# Patient Record
Sex: Female | Born: 1935 | Hispanic: Yes | Marital: Married | Smoking: Never smoker
Health system: Southern US, Community
[De-identification: ages and names within clinical notes are randomized; demographics above are authoritative.]

## PROBLEM LIST (undated history)

## (undated) DIAGNOSIS — M8580 Other specified disorders of bone density and structure, unspecified site: Secondary | ICD-10-CM

## (undated) DIAGNOSIS — E079 Disorder of thyroid, unspecified: Secondary | ICD-10-CM

## (undated) DIAGNOSIS — M5414 Radiculopathy, thoracic region: Secondary | ICD-10-CM

## (undated) DIAGNOSIS — N390 Urinary tract infection, site not specified: Secondary | ICD-10-CM

## (undated) DIAGNOSIS — I8311 Varicose veins of right lower extremity with inflammation: Secondary | ICD-10-CM

## (undated) DIAGNOSIS — T7840XA Allergy, unspecified, initial encounter: Secondary | ICD-10-CM

## (undated) DIAGNOSIS — R519 Headache, unspecified: Secondary | ICD-10-CM

## (undated) DIAGNOSIS — R0600 Dyspnea, unspecified: Secondary | ICD-10-CM

## (undated) DIAGNOSIS — M1990 Unspecified osteoarthritis, unspecified site: Secondary | ICD-10-CM

## (undated) DIAGNOSIS — K219 Gastro-esophageal reflux disease without esophagitis: Secondary | ICD-10-CM

## (undated) DIAGNOSIS — I10 Essential (primary) hypertension: Secondary | ICD-10-CM

## (undated) DIAGNOSIS — R51 Headache: Secondary | ICD-10-CM

## (undated) DIAGNOSIS — E039 Hypothyroidism, unspecified: Secondary | ICD-10-CM

## (undated) DIAGNOSIS — B019 Varicella without complication: Secondary | ICD-10-CM

## (undated) DIAGNOSIS — H269 Unspecified cataract: Secondary | ICD-10-CM

## (undated) DIAGNOSIS — H401133 Primary open-angle glaucoma, bilateral, severe stage: Secondary | ICD-10-CM

## (undated) DIAGNOSIS — M5431 Sciatica, right side: Secondary | ICD-10-CM

## (undated) DIAGNOSIS — R32 Unspecified urinary incontinence: Secondary | ICD-10-CM

## (undated) DIAGNOSIS — K635 Polyp of colon: Secondary | ICD-10-CM

## (undated) HISTORY — DX: Headache, unspecified: R51.9

## (undated) HISTORY — DX: Disorder of thyroid, unspecified: E07.9

## (undated) HISTORY — DX: Varicella without complication: B01.9

## (undated) HISTORY — DX: Unspecified urinary incontinence: R32

## (undated) HISTORY — DX: Allergy, unspecified, initial encounter: T78.40XA

## (undated) HISTORY — DX: Sciatica, right side: M54.31

## (undated) HISTORY — DX: Unspecified cataract: H26.9

## (undated) HISTORY — PX: CHOLECYSTECTOMY: SHX55

## (undated) HISTORY — DX: Essential (primary) hypertension: I10

## (undated) HISTORY — DX: Polyp of colon: K63.5

## (undated) HISTORY — PX: BUNIONECTOMY: SHX129

## (undated) HISTORY — DX: Urinary tract infection, site not specified: N39.0

## (undated) HISTORY — DX: Unspecified osteoarthritis, unspecified site: M19.90

## (undated) HISTORY — PX: TONSILLECTOMY: SUR1361

## (undated) HISTORY — PX: EYE SURGERY: SHX253

## (undated) HISTORY — DX: Headache: R51

---

## 1996-02-16 HISTORY — PX: APPENDECTOMY: SHX54

## 2002-02-15 HISTORY — PX: CATARACT EXTRACTION W/ INTRAOCULAR LENS  IMPLANT, BILATERAL: SHX1307

## 2005-02-15 HISTORY — PX: COLONOSCOPY: SHX174

## 2006-02-15 DIAGNOSIS — E1142 Type 2 diabetes mellitus with diabetic polyneuropathy: Secondary | ICD-10-CM

## 2006-02-15 HISTORY — DX: Type 2 diabetes mellitus with diabetic polyneuropathy: E11.42

## 2006-07-22 LAB — HM COLONOSCOPY: HM Colonoscopy: NORMAL

## 2010-09-15 LAB — HM PAP SMEAR: HM Pap smear: NORMAL

## 2011-07-08 ENCOUNTER — Ambulatory Visit: Payer: Self-pay | Admitting: Internal Medicine

## 2011-07-22 ENCOUNTER — Ambulatory Visit (INDEPENDENT_AMBULATORY_CARE_PROVIDER_SITE_OTHER): Payer: Medicare Other | Admitting: Internal Medicine

## 2011-07-22 ENCOUNTER — Encounter: Payer: Self-pay | Admitting: Internal Medicine

## 2011-07-22 VITALS — BP 110/70 | HR 65 | Temp 98.3°F | Ht 62.75 in | Wt 172.5 lb

## 2011-07-22 DIAGNOSIS — N838 Other noninflammatory disorders of ovary, fallopian tube and broad ligament: Secondary | ICD-10-CM | POA: Insufficient documentation

## 2011-07-22 DIAGNOSIS — N83209 Unspecified ovarian cyst, unspecified side: Secondary | ICD-10-CM

## 2011-07-22 DIAGNOSIS — E785 Hyperlipidemia, unspecified: Secondary | ICD-10-CM

## 2011-07-22 DIAGNOSIS — E1142 Type 2 diabetes mellitus with diabetic polyneuropathy: Secondary | ICD-10-CM | POA: Insufficient documentation

## 2011-07-22 DIAGNOSIS — D649 Anemia, unspecified: Secondary | ICD-10-CM

## 2011-07-22 DIAGNOSIS — N83201 Unspecified ovarian cyst, right side: Secondary | ICD-10-CM | POA: Insufficient documentation

## 2011-07-22 DIAGNOSIS — E119 Type 2 diabetes mellitus without complications: Secondary | ICD-10-CM

## 2011-07-22 DIAGNOSIS — E039 Hypothyroidism, unspecified: Secondary | ICD-10-CM | POA: Insufficient documentation

## 2011-07-22 MED ORDER — METFORMIN HCL 500 MG PO TABS: 500.0000 mg | ORAL_TABLET | Freq: Two times a day (BID) | ORAL | Status: DC

## 2011-07-22 NOTE — Assessment & Plan Note (Signed)
Reviewed records from February 2012 ultrasound showing right-sided septated ovarian cyst. We'll plan to repeat pelvic ultrasound to ensure that this has resolved.

## 2011-07-22 NOTE — Progress Notes (Signed)
Subjective:    Patient ID: [name], female    DOB: 1934/09/07, 77 y.o.   MRN: [id number]  HPI 77 year old female with history of diabetes, hypothyroidism, arthritis presents to establish care. She recently moved to [place] from [place]. She reports that she is generally feeling well. In regards to her diabetes, she reports that she is currently using Onglyza. She reports that this medication is very expensive for her. In the past, her blood sugar was well controlled with metformin alone. She changed from metformin because the preparation she was using had an odd smell to it. She would like to try back on metformin.  She also notes that she has a history of a right-sided ovarian cyst. This cyst was noted to be septated and she was told that she would need to have a repeat pelvic ultrasound in 2012. However, she has not yet followed through with this. She denies any recent change in bowel habits, abdominal pain, or abdominal distention.  Outpatient Encounter Prescriptions as of 07/22/2011  Medication Sig Dispense Refill  . aspirin 81 MG tablet Take 81 mg by mouth daily.      Marland Kitchen atenolol (TENORMIN) 50 MG tablet Take 50 mg by mouth daily.      . bimatoprost (LUMIGAN) 0.01 % SOLN Place 1 drop into both eyes at bedtime.      Marland Kitchen BIOTIN 5000 PO Take 1 tablet by mouth daily.      Marland Kitchen levothyroxine (SYNTHROID, LEVOTHROID) 100 MCG tablet Take 100 mcg by mouth daily.      Marland Kitchen losartan-hydrochlorothiazide (HYZAAR) 50-12.5 MG per tablet Take 1 tablet by mouth daily.      . montelukast (SINGULAIR) 10 MG tablet Take 10 mg by mouth daily.      . Multiple Vitamins-Minerals (MULTIVITAMIN PO) Take 1 tablet by mouth daily.      . Timolol Maleate (ISTALOL) 0.5 % (DAILY) SOLN Apply 1 drop to eye daily.      Marland Kitchen DISCONTD: saxagliptin HCl (ONGLYZA) 5 MG TABS tablet Take 5 mg by mouth daily.      . metFORMIN (GLUCOPHAGE) 500 MG tablet Take 1 tablet (500 mg total) by mouth 2 (two) times daily with a meal.  180  tablet  3    Review of Systems  Constitutional: Negative for fever, chills, appetite change, fatigue and unexpected weight change.  HENT: Negative for ear pain, congestion, sore throat, trouble swallowing, neck pain, voice change and sinus pressure.   Eyes: Negative for visual disturbance.  Respiratory: Negative for cough, shortness of breath, wheezing and stridor.   Cardiovascular: Negative for chest pain, palpitations and leg swelling.  Gastrointestinal: Negative for nausea, vomiting, abdominal pain, diarrhea, constipation, blood in stool, abdominal distention and anal bleeding.  Genitourinary: Negative for dysuria and flank pain.  Musculoskeletal: Negative for myalgias, arthralgias and gait problem.  Skin: Negative for color change and rash.  Neurological: Negative for dizziness and headaches.  Hematological: Negative for adenopathy. Does not bruise/bleed easily.  Psychiatric/Behavioral: Negative for suicidal ideas, sleep disturbance and dysphoric mood. The patient is not nervous/anxious.    BP 110/70  Pulse 65  Temp(Src) 98.3 F (36.8 C) (Oral)  Ht 5' 2.75" (1.594 m)  Wt 172 lb 8 oz (78.245 kg)  BMI 30.80 kg/m2  SpO2 96%     Objective:   Physical Exam  Constitutional: She is oriented to person, place, and time. She appears well-developed and well-nourished. No distress.  HENT:  Head: Normocephalic and atraumatic.  Right Ear: External ear normal.  Left Ear: External ear normal.  Nose: Nose normal.  Mouth/Throat: Oropharynx is clear and moist. No oropharyngeal exudate.  Eyes: Conjunctivae are normal. Pupils are equal, round, and reactive to light. Right eye exhibits no discharge. Left eye exhibits no discharge. No scleral icterus.  Neck: Normal range of motion. Neck supple. No tracheal deviation present. No thyromegaly present.  Cardiovascular: Normal rate, regular rhythm, normal heart sounds and intact distal pulses.  Exam reveals no gallop and no friction rub.   No murmur  heard. Pulmonary/Chest: Effort normal and breath sounds normal. No respiratory distress. She has no wheezes. She has no rales. She exhibits no tenderness.  Abdominal: Soft. Bowel sounds are normal. She exhibits no distension and no mass. There is no tenderness. There is no guarding.  Musculoskeletal: Normal range of motion. She exhibits no edema and no tenderness.  Lymphadenopathy:    She has no cervical adenopathy.  Neurological: She is alert and oriented to person, place, and time. No cranial nerve deficit. She exhibits normal muscle tone. Coordination normal.  Skin: Skin is warm and dry. No rash noted. She is not diaphoretic. No erythema. No pallor.  Psychiatric: She has a normal mood and affect. Her behavior is normal. Judgment and thought content normal.          Assessment & Plan:

## 2011-07-22 NOTE — Assessment & Plan Note (Signed)
Will check TSH with labs today. We'll plan to continue Synthroid.

## 2011-07-22 NOTE — Assessment & Plan Note (Signed)
Will try changing back to metformin given cost of Onglyza. Will check A1c with labs today. Patient will call her blood sugars and bring record of blood sugars to next visit in one month. We'll also check lipids with labs today, LDL less than 70. Patient is on an ARB. Patient will bring records from her previous physician in regards to her last Pneumovax.

## 2011-07-23 LAB — COMPREHENSIVE METABOLIC PANEL
ALT: 45 U/L — ABNORMAL HIGH (ref 0–35)
BUN: 19 mg/dL (ref 6–23)
CO2: 31 mEq/L (ref 19–32)
Calcium: 9 mg/dL (ref 8.4–10.5)
Chloride: 102 mEq/L (ref 96–112)
Creatinine, Ser: 0.9 mg/dL (ref 0.4–1.2)
GFR: 62.55 mL/min (ref >60.00)
Glucose, Bld: 83 mg/dL (ref 70–99)
Total Bilirubin: 0.5 mg/dL (ref 0.3–1.2)

## 2011-07-23 LAB — LIPID PANEL
Cholesterol: 121 mg/dL (ref 0–200)
HDL: 43.9 mg/dL (ref >39.00)
LDL Cholesterol: 58 mg/dL (ref 0–99)
Total CHOL/HDL Ratio: 3
Triglycerides: 96 mg/dL (ref 0.0–149.0)

## 2011-07-23 LAB — CBC WITH DIFFERENTIAL/PLATELET
Basophils Relative: 0.4 % (ref 0.0–3.0)
Eosinophils Relative: 3.6 % (ref 0.0–5.0)
Lymphocytes Relative: 33.9 % (ref 12.0–46.0)
Monocytes Absolute: 0.4 10*3/uL (ref 0.1–1.0)
Monocytes Relative: 7 % (ref 3.0–12.0)
Neutrophils Relative %: 55.1 % (ref 43.0–77.0)
Platelets: 196 10*3/uL (ref 150.0–400.0)
RBC: 4.33 Mil/uL (ref 3.87–5.11)
WBC: 5.5 10*3/uL (ref 4.5–10.5)

## 2011-07-23 LAB — TSH: TSH: 0.86 u[IU]/mL (ref 0.35–5.50)

## 2011-07-23 LAB — HEMOGLOBIN A1C: Hgb A1c MFr Bld: 6.5 % (ref 4.6–6.5)

## 2011-08-03 ENCOUNTER — Ambulatory Visit: Payer: Self-pay | Admitting: Internal Medicine

## 2011-08-04 ENCOUNTER — Telehealth: Payer: Self-pay | Admitting: Internal Medicine

## 2011-08-04 NOTE — Telephone Encounter (Signed)
Ultrasound of the Pelvis was normal.

## 2011-08-09 ENCOUNTER — Encounter: Payer: Self-pay | Admitting: Internal Medicine

## 2011-08-27 ENCOUNTER — Ambulatory Visit (INDEPENDENT_AMBULATORY_CARE_PROVIDER_SITE_OTHER)
Admission: RE | Admit: 2011-08-27 | Discharge: 2011-08-27 | Disposition: A | Discharge status: Home / Self Care | Payer: Medicare Other | Source: Ambulatory Visit | Attending: Internal Medicine | Admitting: Internal Medicine

## 2011-08-27 ENCOUNTER — Ambulatory Visit (INDEPENDENT_AMBULATORY_CARE_PROVIDER_SITE_OTHER): Payer: Medicare Other | Admitting: Internal Medicine

## 2011-08-27 ENCOUNTER — Encounter: Payer: Self-pay | Admitting: Internal Medicine

## 2011-08-27 ENCOUNTER — Telehealth: Payer: Self-pay | Admitting: Internal Medicine

## 2011-08-27 VITALS — BP 130/80 | HR 61 | Temp 98.1°F | Ht 62.75 in | Wt 170.8 lb

## 2011-08-27 DIAGNOSIS — E039 Hypothyroidism, unspecified: Secondary | ICD-10-CM

## 2011-08-27 DIAGNOSIS — I10 Essential (primary) hypertension: Secondary | ICD-10-CM

## 2011-08-27 DIAGNOSIS — R0789 Other chest pain: Secondary | ICD-10-CM

## 2011-08-27 DIAGNOSIS — E119 Type 2 diabetes mellitus without complications: Secondary | ICD-10-CM

## 2011-08-27 DIAGNOSIS — J45909 Unspecified asthma, uncomplicated: Secondary | ICD-10-CM

## 2011-08-27 DIAGNOSIS — N83209 Unspecified ovarian cyst, unspecified side: Secondary | ICD-10-CM

## 2011-08-27 DIAGNOSIS — R071 Chest pain on breathing: Secondary | ICD-10-CM

## 2011-08-27 DIAGNOSIS — N83201 Unspecified ovarian cyst, right side: Secondary | ICD-10-CM

## 2011-08-27 MED ORDER — LOSARTAN POTASSIUM-HCTZ 50-12.5 MG PO TABS: 1.0000 | ORAL_TABLET | Freq: Every day | ORAL | Status: DC

## 2011-08-27 MED ORDER — MONTELUKAST SODIUM 10 MG PO TABS: 10.0000 mg | ORAL_TABLET | Freq: Every day | ORAL | Status: DC

## 2011-08-27 MED ORDER — LEVOTHYROXINE SODIUM 100 MCG PO TABS: 100.0000 ug | ORAL_TABLET | Freq: Every day | ORAL | Status: DC

## 2011-08-27 MED ORDER — METFORMIN HCL 500 MG PO TABS: 500.0000 mg | ORAL_TABLET | Freq: Two times a day (BID) | ORAL | Status: DC

## 2011-08-27 MED ORDER — ATENOLOL 50 MG PO TABS: 50.0000 mg | ORAL_TABLET | Freq: Every day | ORAL | Status: DC

## 2011-08-27 NOTE — Progress Notes (Signed)
Subjective:    Patient ID: [name], female    DOB: 03-21-1934, 77 y.o.   MRN: [id number]  HPI 77 year old female with history of diabetes, hypothyroidism, hypertension presents for followup. In regards to her diabetes, at her last visit we had discussed transitioning back from onglyza  2 metformin. However, when her pharmacy dispensed metformin to her this medication had a foul smell to it and she did not start it. She stayed on Onglyza. She reports good control of her blood sugars. Her last A1c was 6.5%.  In regards to hypothyroidism and hypertension, she reports full compliance with her medications. She denies any noted side effects from the medicines. She did not bring a record of her blood pressures today.  She continues to have right-sided posterior chest wall pain. This is been present for several years. It is described as superficial pain which is at times tingling or itching. It occasionally feels sharp like stabbing pain. She reports has been evaluated in the past but no one has determine etiology. She denies shortness of breath, diaphoresis. Recently, she has been applying her husband's topical hydrocortisone cream to the area with some improvement in her symptoms.  Outpatient Encounter Prescriptions as of 08/27/2011  Medication Sig Dispense Refill  . aspirin 81 MG tablet Take 81 mg by mouth daily.      Marland Kitchen atenolol (TENORMIN) 50 MG tablet Take 1 tablet (50 mg total) by mouth daily.  90 tablet  4  . bimatoprost (LUMIGAN) 0.01 % SOLN Place 1 drop into both eyes at bedtime.      Marland Kitchen BIOTIN 5000 PO Take 1 tablet by mouth daily.      Marland Kitchen levothyroxine (SYNTHROID, LEVOTHROID) 100 MCG tablet Take 1 tablet (100 mcg total) by mouth daily.  90 tablet  4  . losartan-hydrochlorothiazide (HYZAAR) 50-12.5 MG per tablet Take 1 tablet by mouth daily.  90 tablet  4  . montelukast (SINGULAIR) 10 MG tablet Take 1 tablet (10 mg total) by mouth daily.  90 tablet  4  . Multiple Vitamins-Minerals  (MULTIVITAMIN PO) Take 1 tablet by mouth daily.      . Timolol Maleate (ISTALOL) 0.5 % (DAILY) SOLN Apply 1 drop to eye daily.      . metFORMIN (GLUCOPHAGE) 500 MG tablet Take 1 tablet (500 mg total) by mouth 2 (two) times daily with a meal.  60 tablet  3   BP 130/80  Pulse 61  Temp 98.1 F (36.7 C) (Oral)  Ht 5' 2.75" (1.594 m)  Wt 170 lb 12 oz (77.452 kg)  BMI 30.49 kg/m2  SpO2 97%  Review of Systems  Constitutional: Negative for fever, chills, appetite change, fatigue and unexpected weight change.  HENT: Negative for ear pain, congestion, sore throat, trouble swallowing, neck pain, voice change and sinus pressure.   Eyes: Negative for visual disturbance.  Respiratory: Negative for cough, shortness of breath, wheezing and stridor.   Cardiovascular: Positive for chest pain. Negative for palpitations and leg swelling.  Gastrointestinal: Negative for nausea, vomiting, abdominal pain, diarrhea, constipation, blood in stool, abdominal distention and anal bleeding.  Genitourinary: Negative for dysuria and flank pain.  Musculoskeletal: Negative for myalgias, arthralgias and gait problem.  Skin: Negative for color change and rash.  Neurological: Negative for dizziness and headaches.  Hematological: Negative for adenopathy. Does not bruise/bleed easily.  Psychiatric/Behavioral: Negative for suicidal ideas, disturbed wake/sleep cycle and dysphoric mood. The patient is not nervous/anxious.        Objective:   Physical Exam  Constitutional:  She is oriented to person, place, and time. She appears well-developed and well-nourished. No distress.  HENT:  Head: Normocephalic and atraumatic.  Right Ear: External ear normal.  Left Ear: External ear normal.  Nose: Nose normal.  Mouth/Throat: Oropharynx is clear and moist. No oropharyngeal exudate.  Eyes: Conjunctivae are normal. Pupils are equal, round, and reactive to light. Right eye exhibits no discharge. Left eye exhibits no discharge. No  scleral icterus.  Neck: Normal range of motion. Neck supple. No tracheal deviation present. No thyromegaly present.  Cardiovascular: Normal rate, regular rhythm, normal heart sounds and intact distal pulses.  Exam reveals no gallop and no friction rub.   No murmur heard. Pulmonary/Chest: Effort normal and breath sounds normal. No accessory muscle usage. Not tachypneic. No respiratory distress. She has no decreased breath sounds. She has no wheezes. She has no rhonchi. She has no rales.   She exhibits no tenderness.  Musculoskeletal: Normal range of motion. She exhibits no edema and no tenderness.  Lymphadenopathy:    She has no cervical adenopathy.  Neurological: She is alert and oriented to person, place, and time. No cranial nerve deficit. She exhibits normal muscle tone. Coordination normal.  Skin: Skin is warm and dry. No rash noted. She is not diaphoretic. No erythema. No pallor.  Psychiatric: She has a normal mood and affect. Her behavior is normal. Judgment and thought content normal.          Assessment & Plan:

## 2011-08-27 NOTE — Assessment & Plan Note (Signed)
Recent pelvic ultrasound was normal and demonstrated resolution of this cyst.

## 2011-08-27 NOTE — Patient Instructions (Signed)
Postherpetic Neuralgia  Shingles is a painful disease. It is caused by the herpes zoster virus. This is the same virus which also causes chickenpox. It can affect the torso, limbs, or the face. For most people, shingles is a condition of rather sudden onset. Pain usually lasts about 1 month.  In older patients, or patients with poor immune systems, a painful, long-standing (chronic) condition called postherpetic neuralgia can develop. This condition rarely happens before age 50. But at least 50% of people over 50 become affected following an attack of shingles. There is a natural tendency for this condition to improve over time with no treatment. Less than 5% of patients have pain that lasts for more than 1 year.  DIAGNOSIS   Herpes is usually easily diagnosed on physical exam. Pain sometimes follows when the skin sores (lesions) have disappeared. It is called postherpetic neuralgia. That name simply means the pain that follows herpes.  TREATMENT    Treating this condition may be difficult. Usually one of the tricyclic antidepressants, often amitriptyline, is the first line of treatment. There is evidence that the sooner these medications are given, the more likely they are to reduce pain.   Conventional analgesics, regional nerve blocks, and anticonvulsants have little benefit in most cases when used alone. Other tricyclic anti-depressants are used as a second option if the first antidepressant is unsuccessful.   Anticonvulsants, including carbamazepine, have been found to provide some added benefit when used with a tricyclic anti-depressant. This is especially for the stabbing type of pain similar to that of trigeminal neuralgia.   Chronic opioid therapy. This is a strong narcotic pain medication. It is used to treat pain that is resistant to other measures. The issues of dependency and tolerance can be reduced with closely managed care.   Some cream treatments are applied locally to the affected area. They  can help when used with other treatments. Their use may be difficult in the case of postherpetic trigeminal neuralgia. This is involved with the face. So the substances can irritate the eye and the skin around the eye. Examples of creams used include Capsaicin and lidocaine creams.   For shingles, antiviral therapies along with analgesics are recommended. Studies of the effect of anti-viral agents such as acyclovir on shingles have been done. They show improved rates of healing and decreased severity of sudden (acute) pain. Some observations suggest that nerve blocks during shingles infection will:   Reduce pain.   Shorten the acute episode.   Prevent the emergence of postherpetic neuralgia.  Viral medications used include Acyclovir (Zovirax), Valacyclovir, Famciclovir and a lysine diet.  Document Released: 04/24/2002 Document Revised: 01/21/2011 Document Reviewed: 02/01/2005  [employer] Patient Information 2012 [employer].

## 2011-08-27 NOTE — Assessment & Plan Note (Signed)
Blood sugars have been very well-controlled on onglyza. Attempted to switch back to metformin, however metformin that patient was given by her pharmacy had extremely foul smell so she did not take this medication. Will request a different brand of metformin from her pharmacy. Will continue onglyza for now. Repeat A1c in 3 months.

## 2011-08-27 NOTE — Telephone Encounter (Signed)
Abnormal metformin

## 2011-08-27 NOTE — Assessment & Plan Note (Signed)
Patient has right-sided posterior chest wall pain. Her description of the pain seems most consistent with postherpetic neuralgia, however she does not recall having shingles at this site. Plain x-ray of the chest was normal today. Exam is normal. She reports improvement using topical hydrocortisone cream. We'll plan to continue that for now as needed. If symptoms worsen, we discussed trying Neurontin.

## 2011-11-25 ENCOUNTER — Other Ambulatory Visit: Payer: Self-pay

## 2011-11-25 DIAGNOSIS — E785 Hyperlipidemia, unspecified: Secondary | ICD-10-CM

## 2011-11-26 ENCOUNTER — Other Ambulatory Visit: Payer: Medicare Other

## 2011-11-26 ENCOUNTER — Other Ambulatory Visit (INDEPENDENT_AMBULATORY_CARE_PROVIDER_SITE_OTHER): Payer: Medicare Other

## 2011-11-26 DIAGNOSIS — E785 Hyperlipidemia, unspecified: Secondary | ICD-10-CM

## 2011-11-26 LAB — COMPREHENSIVE METABOLIC PANEL
ALT: 39 U/L — ABNORMAL HIGH (ref 0–35)
AST: 23 U/L (ref 0–37)
BUN: 19 mg/dL (ref 6–23)
Creatinine, Ser: 0.8 mg/dL (ref 0.4–1.2)
Total Bilirubin: 0.8 mg/dL (ref 0.3–1.2)

## 2011-11-26 LAB — CBC WITH DIFFERENTIAL/PLATELET
Basophils Absolute: 0 10*3/uL (ref 0.0–0.1)
Eosinophils Absolute: 0.2 10*3/uL (ref 0.0–0.7)
HCT: 40.6 % (ref 36.0–46.0)
Hemoglobin: 13.4 g/dL (ref 12.0–15.0)
Lymphs Abs: 2 10*3/uL (ref 0.7–4.0)
MCHC: 32.9 g/dL (ref 30.0–36.0)
MCV: 89.5 fl (ref 78.0–100.0)
Monocytes Absolute: 0.5 10*3/uL (ref 0.1–1.0)
Neutro Abs: 3 10*3/uL (ref 1.4–7.7)
RDW: 14.5 % (ref 11.5–14.6)

## 2011-11-26 LAB — HEPATIC FUNCTION PANEL
AST: 23 U/L (ref 0–37)
Albumin: 3.9 g/dL (ref 3.5–5.2)
Alkaline Phosphatase: 60 U/L (ref 39–117)
Bilirubin, Direct: 0.2 mg/dL (ref 0.0–0.3)
Total Protein: 7 g/dL (ref 6.0–8.3)

## 2011-11-26 LAB — LIPID PANEL
Total CHOL/HDL Ratio: 3
VLDL: 14.8 mg/dL (ref 0.0–40.0)

## 2011-11-26 LAB — TSH: TSH: 3.94 u[IU]/mL (ref 0.35–5.50)

## 2011-11-29 ENCOUNTER — Encounter: Payer: Self-pay | Admitting: Internal Medicine

## 2011-11-29 ENCOUNTER — Ambulatory Visit (INDEPENDENT_AMBULATORY_CARE_PROVIDER_SITE_OTHER): Payer: Medicare Other | Admitting: Internal Medicine

## 2011-11-29 VITALS — BP 120/72 | HR 59 | Temp 97.7°F | Ht 63.75 in | Wt 174.5 lb

## 2011-11-29 DIAGNOSIS — E119 Type 2 diabetes mellitus without complications: Secondary | ICD-10-CM

## 2011-11-29 DIAGNOSIS — R7989 Other specified abnormal findings of blood chemistry: Secondary | ICD-10-CM

## 2011-11-29 DIAGNOSIS — E039 Hypothyroidism, unspecified: Secondary | ICD-10-CM

## 2011-11-29 DIAGNOSIS — I10 Essential (primary) hypertension: Secondary | ICD-10-CM | POA: Insufficient documentation

## 2011-11-29 DIAGNOSIS — H401190 Primary open-angle glaucoma, unspecified eye, stage unspecified: Secondary | ICD-10-CM | POA: Insufficient documentation

## 2011-11-29 DIAGNOSIS — H409 Unspecified glaucoma: Secondary | ICD-10-CM

## 2011-11-29 DIAGNOSIS — Z23 Encounter for immunization: Secondary | ICD-10-CM

## 2011-11-29 LAB — HEMOGLOBIN A1C: Hgb A1c MFr Bld: 6.6 % — ABNORMAL HIGH (ref 4.6–6.5)

## 2011-11-29 MED ORDER — METFORMIN HCL 500 MG PO TABS: 500.0000 mg | ORAL_TABLET | Freq: Two times a day (BID) | ORAL | Status: DC

## 2011-11-29 MED ORDER — ATENOLOL 50 MG PO TABS: 50.0000 mg | ORAL_TABLET | Freq: Every day | ORAL | Status: DC

## 2011-11-29 NOTE — Assessment & Plan Note (Signed)
LFTs noted to be elevated on multiple occasions. Will send labs today for hepatitis C and hepatitis B. If normal, may consider ultrasound to look for steatohepatitis in the future.

## 2011-11-29 NOTE — Assessment & Plan Note (Signed)
Blood pressure well-controlled on current medications. Recent renal function was normal. Followup 3 months.

## 2011-11-29 NOTE — Assessment & Plan Note (Signed)
Recent TSH was normal. Continue Synthroid 100 mcg daily. Followup in 3 months.

## 2011-11-29 NOTE — Addendum Note (Signed)
Addended by: Gilmer Mor on: 11/29/2011 02:53 PM   Modules accepted: Orders

## 2011-11-29 NOTE — Assessment & Plan Note (Signed)
Pt reports good control of BG. Will check A1c with labs today. Continue metformin. Follow up 3 months and prn.

## 2011-11-29 NOTE — Progress Notes (Signed)
Subjective:    Patient ID: [name], female    DOB: 05-20-1934, 77 y.o.   MRN: [id number]  HPI 77 year old female with history of diabetes, hypertension, hypothyroidism, glaucoma presents for followup. She reports she is generally feeling well. In regards to her diabetes, she reports that blood sugars have been well-controlled on metformin. She reports full compliance with her medication. She notes that she has a history in the past of glaucoma and has not yet established care with a local ophthalmologist. She would like referral for this today.  Today we discussed history of elevated liver function tests. She reports that she has never been diagnosed with abnormal liver function in the past. She denies any known history of hepatitis B or hepatitis C. She has never had blood transfusion. She denies any history of jaundice in the past.  Outpatient Encounter Prescriptions as of 11/29/2011  Medication Sig Dispense Refill  . aspirin 81 MG tablet Take 81 mg by mouth daily.      Marland Kitchen atenolol (TENORMIN) 50 MG tablet Take 1 tablet (50 mg total) by mouth daily.  90 tablet  3  . bimatoprost (LUMIGAN) 0.01 % SOLN Place 1 drop into both eyes at bedtime.      Marland Kitchen BIOTIN 5000 PO Take 1 tablet by mouth daily.      Marland Kitchen levothyroxine (SYNTHROID, LEVOTHROID) 100 MCG tablet Take 1 tablet (100 mcg total) by mouth daily.  90 tablet  4  . losartan-hydrochlorothiazide (HYZAAR) 50-12.5 MG per tablet Take 1 tablet by mouth daily.  90 tablet  4  . metFORMIN (GLUCOPHAGE) 500 MG tablet Take 1 tablet (500 mg total) by mouth 2 (two) times daily with a meal.  180 tablet  3  . montelukast (SINGULAIR) 10 MG tablet Take 1 tablet (10 mg total) by mouth daily.  90 tablet  4  . Multiple Vitamins-Minerals (MULTIVITAMIN PO) Take 1 tablet by mouth daily.      . Timolol Maleate (ISTALOL) 0.5 % (DAILY) SOLN Apply 1 drop to eye daily.      Marland Kitchen DISCONTD: atenolol (TENORMIN) 50 MG tablet Take 1 tablet (50 mg total) by mouth daily.  90 tablet   4  . DISCONTD: metFORMIN (GLUCOPHAGE) 500 MG tablet Take 1 tablet (500 mg total) by mouth 2 (two) times daily with a meal.  60 tablet  3    BP 120/72  Pulse 59  Temp 97.7 F (36.5 C) (Oral)  Ht 5' 3.75" (1.619 m)  Wt 174 lb 8 oz (79.153 kg)  BMI 30.19 kg/m2  SpO2 93%  Review of Systems  Constitutional: Negative for fever, chills, appetite change, fatigue and unexpected weight change.  HENT: Negative for ear pain, congestion, sore throat, trouble swallowing, neck pain, voice change and sinus pressure.   Eyes: Negative for visual disturbance.  Respiratory: Negative for cough, shortness of breath, wheezing and stridor.   Cardiovascular: Negative for chest pain, palpitations and leg swelling.  Gastrointestinal: Negative for nausea, vomiting, abdominal pain, diarrhea, constipation, blood in stool, abdominal distention and anal bleeding.  Genitourinary: Negative for dysuria and flank pain.  Musculoskeletal: Negative for myalgias, arthralgias and gait problem.  Skin: Negative for color change and rash.  Neurological: Negative for dizziness and headaches.  Hematological: Negative for adenopathy. Does not bruise/bleed easily.  Psychiatric/Behavioral: Negative for suicidal ideas, disturbed wake/sleep cycle and dysphoric mood. The patient is not nervous/anxious.        Objective:   Physical Exam  Constitutional: She is oriented to person, place, and time. She  appears well-developed and well-nourished. No distress.  HENT:  Head: Normocephalic and atraumatic.  Right Ear: External ear normal.  Left Ear: External ear normal.  Nose: Nose normal.  Mouth/Throat: Oropharynx is clear and moist. No oropharyngeal exudate.  Eyes: Conjunctivae normal are normal. Pupils are equal, round, and reactive to light. Right eye exhibits no discharge. Left eye exhibits no discharge. No scleral icterus.  Neck: Normal range of motion. Neck supple. No tracheal deviation present. No thyromegaly present.    Cardiovascular: Normal rate, regular rhythm, normal heart sounds and intact distal pulses.  Exam reveals no gallop and no friction rub.   No murmur heard. Pulmonary/Chest: Effort normal and breath sounds normal. No respiratory distress. She has no wheezes. She has no rales. She exhibits no tenderness.  Musculoskeletal: Normal range of motion. She exhibits no edema and no tenderness.  Lymphadenopathy:    She has no cervical adenopathy.  Neurological: She is alert and oriented to person, place, and time. No cranial nerve deficit. She exhibits normal muscle tone. Coordination normal.  Skin: Skin is warm and dry. No rash noted. She is not diaphoretic. No erythema. No pallor.  Psychiatric: She has a normal mood and affect. Her behavior is normal. Judgment and thought content normal.          Assessment & Plan:

## 2011-11-29 NOTE — Assessment & Plan Note (Signed)
Patient with history of glaucoma. Will set up referral to local ophthalmologist.

## 2011-11-30 ENCOUNTER — Encounter: Payer: Self-pay | Admitting: Internal Medicine

## 2011-11-30 LAB — HEPATITIS B SURFACE ANTIBODY,QUALITATIVE: Hep B S Ab: NEGATIVE

## 2011-11-30 LAB — HEPATITIS B SURFACE ANTIGEN: Hepatitis B Surface Ag: NEGATIVE

## 2011-12-08 ENCOUNTER — Telehealth: Payer: Self-pay | Admitting: Internal Medicine

## 2011-12-08 NOTE — Telephone Encounter (Signed)
The patient's husband called in reference to a form that should have been filled out over a month ago. The form was from [employer] for the patient's diabetic supplies , lancets and meter. The company has not received any paperwork from [place] per the patient's husband.

## 2011-12-16 NOTE — Telephone Encounter (Signed)
Form faxed to [employer] at [phone].

## 2011-12-17 ENCOUNTER — Ambulatory Visit (INDEPENDENT_AMBULATORY_CARE_PROVIDER_SITE_OTHER): Payer: Medicare Other | Admitting: Internal Medicine

## 2011-12-17 ENCOUNTER — Encounter: Payer: Self-pay | Admitting: Internal Medicine

## 2011-12-17 VITALS — BP 140/80 | HR 56 | Temp 97.8°F | Ht 63.75 in | Wt 173.5 lb

## 2011-12-17 DIAGNOSIS — M7989 Other specified soft tissue disorders: Secondary | ICD-10-CM

## 2011-12-17 DIAGNOSIS — M799 Soft tissue disorder, unspecified: Secondary | ICD-10-CM

## 2011-12-17 DIAGNOSIS — IMO0001 Reserved for inherently not codable concepts without codable children: Secondary | ICD-10-CM

## 2011-12-17 DIAGNOSIS — E039 Hypothyroidism, unspecified: Secondary | ICD-10-CM

## 2011-12-17 DIAGNOSIS — M791 Myalgia, unspecified site: Secondary | ICD-10-CM

## 2011-12-17 DIAGNOSIS — D510 Vitamin B12 deficiency anemia due to intrinsic factor deficiency: Secondary | ICD-10-CM

## 2011-12-17 LAB — COMPREHENSIVE METABOLIC PANEL
ALT: 35 U/L (ref 0–35)
AST: 25 U/L (ref 0–37)
Albumin: 3.9 g/dL (ref 3.5–5.2)
Alkaline Phosphatase: 58 U/L (ref 39–117)
BUN: 20 mg/dL (ref 6–23)
Potassium: 3.8 mEq/L (ref 3.5–5.1)

## 2011-12-17 LAB — CK: Total CK: 202 U/L — ABNORMAL HIGH (ref 7–177)

## 2011-12-17 LAB — TSH: TSH: 2.34 u[IU]/mL (ref 0.35–5.50)

## 2011-12-17 NOTE — Patient Instructions (Signed)
Stop Metformin. Monitor blood sugars and record.  Follow up next week.

## 2011-12-18 DIAGNOSIS — M7989 Other specified soft tissue disorders: Secondary | ICD-10-CM | POA: Insufficient documentation

## 2011-12-18 DIAGNOSIS — M791 Myalgia, unspecified site: Secondary | ICD-10-CM | POA: Insufficient documentation

## 2011-12-18 NOTE — Progress Notes (Signed)
Subjective:    Patient ID: [name], female    DOB: [birth year], 77 y.o.   MRN: [id number]  HPI 77 year old female with history of diabetes, hypertension, hypothyroidism presents for acute visit complaining of several day history of diffuse myalgia and concerned about mass in her right upper thigh. She reports that symptoms have myalgia occur throughout her body, in both upper and lower extremities. Symptoms she is experiencing are similar to symptoms she had in the past when she used metformin. She reports that the distant past a doctor had taken her off metformin because of concern about sensitivity to this medication. She denies any fever, chills, symptoms of upper respiratory infection, change in bowel habits.  She is also concerned about a nodular area in her right medial thigh which has been present for several weeks. The area is tender to palpation. She denies any trauma to this area. She denies fever or chills. She denies overlying skin changes. The lesion has been constant in size.  Outpatient Encounter Prescriptions as of 12/17/2011  Medication Sig Dispense Refill  . aspirin 81 MG tablet Take 81 mg by mouth daily.      Marland Kitchen atenolol (TENORMIN) 50 MG tablet Take 1 tablet (50 mg total) by mouth daily.  90 tablet  3  . bimatoprost (LUMIGAN) 0.01 % SOLN Place 1 drop into both eyes at bedtime.      Marland Kitchen BIOTIN 5000 PO Take 1 tablet by mouth daily.      Marland Kitchen levothyroxine (SYNTHROID, LEVOTHROID) 100 MCG tablet Take 1 tablet (100 mcg total) by mouth daily.  90 tablet  4  . losartan-hydrochlorothiazide (HYZAAR) 50-12.5 MG per tablet Take 1 tablet by mouth daily.  90 tablet  4  . metFORMIN (GLUCOPHAGE) 500 MG tablet Take 1 tablet (500 mg total) by mouth 2 (two) times daily with a meal.  180 tablet  3  . montelukast (SINGULAIR) 10 MG tablet Take 1 tablet (10 mg total) by mouth daily.  90 tablet  4  . Multiple Vitamins-Minerals (MULTIVITAMIN PO) Take 1 tablet by mouth daily.      . Timolol Maleate  (ISTALOL) 0.5 % (DAILY) SOLN Apply 1 drop to eye daily.       BP 140/80  Pulse 56  Temp 97.8 F (36.6 C) (Oral)  Ht 5' 3.75" (1.619 m)  Wt 173 lb 8 oz (78.699 kg)  BMI 30.02 kg/m2  SpO2 95%  Review of Systems  Constitutional: Negative for fever, chills, appetite change, fatigue and unexpected weight change.  HENT: Negative for ear pain, congestion, sore throat, trouble swallowing, neck pain, voice change and sinus pressure.   Eyes: Negative for visual disturbance.  Respiratory: Negative for cough, shortness of breath, wheezing and stridor.   Cardiovascular: Negative for chest pain, palpitations and leg swelling.  Gastrointestinal: Negative for nausea, vomiting, abdominal pain, diarrhea, constipation, blood in stool, abdominal distention and anal bleeding.  Genitourinary: Negative for dysuria and flank pain.  Musculoskeletal: Positive for myalgias. Negative for arthralgias and gait problem.  Skin: Negative for color change and rash.  Neurological: Negative for dizziness and headaches.  Hematological: Negative for adenopathy. Does not bruise/bleed easily.  Psychiatric/Behavioral: Negative for suicidal ideas, disturbed wake/sleep cycle and dysphoric mood. The patient is not nervous/anxious.        Objective:   Physical Exam  Constitutional: She is oriented to person, place, and time. She appears well-developed and well-nourished. No distress.  HENT:  Head: Normocephalic and atraumatic.  Right Ear: External ear normal.  Left Ear:  External ear normal.  Nose: Nose normal.  Mouth/Throat: Oropharynx is clear and moist. No oropharyngeal exudate.  Eyes: Conjunctivae normal are normal. Pupils are equal, round, and reactive to light. Right eye exhibits no discharge. Left eye exhibits no discharge. No scleral icterus.  Neck: Normal range of motion. Neck supple. No tracheal deviation present. No thyromegaly present.  Cardiovascular: Normal rate, regular rhythm, normal heart sounds and intact  distal pulses.  Exam reveals no gallop and no friction rub.   No murmur heard. Pulmonary/Chest: Effort normal and breath sounds normal. No respiratory distress. She has no wheezes. She has no rales. She exhibits no tenderness.  Musculoskeletal: Normal range of motion. She exhibits no edema and no tenderness.       Legs: Lymphadenopathy:    She has no cervical adenopathy.  Neurological: She is alert and oriented to person, place, and time. No cranial nerve deficit. She exhibits normal muscle tone. Coordination normal.  Skin: Skin is warm and dry. No rash noted. She is not diaphoretic. No erythema. No pallor.  Psychiatric: She has a normal mood and affect. Her behavior is normal. Judgment and thought content normal.          Assessment & Plan:

## 2011-12-18 NOTE — Assessment & Plan Note (Addendum)
Patient with diffuse myalgia. This has occurred in the past in the setting of use of metformin. CK is slightly elevated. Question if she may have toxicity from metformin given her previous experience. Other labs including kidney function and TSH, B12 normal. Will have her stop metformin and monitor blood sugar and symptoms of myalgia. Will repeat CK level next week. Followup one week.

## 2011-12-18 NOTE — Assessment & Plan Note (Signed)
Patient with nodular area right medial thigh. No overlying skin changes to suggest abscess. Question if this may be enlarged lymph node or other mass. Will get ultrasound of the right upper thigh for further evaluation. If ultrasound shows solid mass, will schedule for biopsy.

## 2011-12-20 ENCOUNTER — Ambulatory Visit: Payer: Self-pay | Admitting: Internal Medicine

## 2011-12-22 ENCOUNTER — Other Ambulatory Visit: Payer: Self-pay | Admitting: *Deleted

## 2011-12-22 ENCOUNTER — Telehealth: Payer: Self-pay | Admitting: Internal Medicine

## 2011-12-22 DIAGNOSIS — M7989 Other specified soft tissue disorders: Secondary | ICD-10-CM

## 2011-12-22 DIAGNOSIS — IMO0001 Reserved for inherently not codable concepts without codable children: Secondary | ICD-10-CM

## 2011-12-22 NOTE — Telephone Encounter (Signed)
Ultrasound of the right thigh showed enlarged lymph node. I would like to set up biopsy. I will place gen surgery referral.

## 2011-12-24 ENCOUNTER — Other Ambulatory Visit (INDEPENDENT_AMBULATORY_CARE_PROVIDER_SITE_OTHER): Payer: Medicare Other

## 2011-12-24 DIAGNOSIS — IMO0001 Reserved for inherently not codable concepts without codable children: Secondary | ICD-10-CM

## 2011-12-24 LAB — CK: Total CK: 219 U/L — ABNORMAL HIGH (ref 7–177)

## 2011-12-24 NOTE — Addendum Note (Signed)
Addended by: Montine Circle D on: 12/24/2011 09:00 AM   Modules accepted: Orders

## 2011-12-24 NOTE — Telephone Encounter (Signed)
Patient advised as instructed via telephone, she agrees to see a Development worker, international aid and she will wait to hear from [place] regarding referral.  Please call patient on her cell phone.

## 2011-12-31 ENCOUNTER — Ambulatory Visit (INDEPENDENT_AMBULATORY_CARE_PROVIDER_SITE_OTHER): Payer: Medicare Other | Admitting: Internal Medicine

## 2011-12-31 ENCOUNTER — Encounter: Payer: Self-pay | Admitting: Internal Medicine

## 2011-12-31 VITALS — BP 110/62 | HR 60 | Temp 97.8°F | Ht 63.75 in | Wt 174.5 lb

## 2011-12-31 DIAGNOSIS — M791 Myalgia, unspecified site: Secondary | ICD-10-CM

## 2011-12-31 DIAGNOSIS — I10 Essential (primary) hypertension: Secondary | ICD-10-CM

## 2011-12-31 DIAGNOSIS — M799 Soft tissue disorder, unspecified: Secondary | ICD-10-CM

## 2011-12-31 DIAGNOSIS — Z1239 Encounter for other screening for malignant neoplasm of breast: Secondary | ICD-10-CM

## 2011-12-31 DIAGNOSIS — IMO0001 Reserved for inherently not codable concepts without codable children: Secondary | ICD-10-CM

## 2011-12-31 DIAGNOSIS — E119 Type 2 diabetes mellitus without complications: Secondary | ICD-10-CM

## 2011-12-31 DIAGNOSIS — M7989 Other specified soft tissue disorders: Secondary | ICD-10-CM

## 2011-12-31 DIAGNOSIS — Z23 Encounter for immunization: Secondary | ICD-10-CM

## 2011-12-31 MED ORDER — LOSARTAN POTASSIUM-HCTZ 50-12.5 MG PO TABS: 1.0000 | ORAL_TABLET | Freq: Every day | ORAL | Status: DC

## 2012-01-01 NOTE — Progress Notes (Signed)
Subjective:    Patient ID: [name], female    DOB: 09-23-1934, 77 y.o.   MRN: [id number]  HPI 77 year old female with history of diabetes presents for followup after recent episodes of diffuse myalgia and mass in the right upper medial thigh. She reports that diffuse myalgias stopped completely after stopping metformin. In regards to the mass in her right upper thigh, she had ultrasound performed which showed enlarged lymph nodes. She reports that this site continues to be tender. She denies any fever or chills. She denies any overlying skin changes.  In regards to her diabetes, she reports that blood sugars have been well-controlled with maximum fasting sugars near 150, despite stopping metformin.  Outpatient Encounter Prescriptions as of 12/31/2011  Medication Sig Dispense Refill  . aspirin 81 MG tablet Take 81 mg by mouth daily.      Marland Kitchen atenolol (TENORMIN) 50 MG tablet Take 1 tablet (50 mg total) by mouth daily.  90 tablet  3  . bimatoprost (LUMIGAN) 0.01 % SOLN Place 1 drop into both eyes at bedtime.      Marland Kitchen BIOTIN 5000 PO Take 1 tablet by mouth daily.      Marland Kitchen levothyroxine (SYNTHROID, LEVOTHROID) 100 MCG tablet Take 1 tablet (100 mcg total) by mouth daily.  90 tablet  4  . losartan-hydrochlorothiazide (HYZAAR) 50-12.5 MG per tablet Take 1 tablet by mouth daily.  90 tablet  4  . montelukast (SINGULAIR) 10 MG tablet Take 1 tablet (10 mg total) by mouth daily.  90 tablet  4  . Multiple Vitamins-Minerals (MULTIVITAMIN PO) Take 1 tablet by mouth daily.      . Timolol Maleate (ISTALOL) 0.5 % (DAILY) SOLN Apply 1 drop to eye daily.       BP 110/62  Pulse 60  Temp 97.8 F (36.6 C) (Oral)  Ht 5' 3.75" (1.619 m)  Wt 174 lb 8 oz (79.153 kg)  BMI 30.19 kg/m2  SpO2 96%  Review of Systems  Constitutional: Negative for fever, chills, appetite change, fatigue and unexpected weight change.  HENT: Negative for ear pain, congestion, sore throat, trouble swallowing, neck pain, voice change and  sinus pressure.   Eyes: Negative for visual disturbance.  Respiratory: Negative for cough, shortness of breath, wheezing and stridor.   Cardiovascular: Negative for chest pain, palpitations and leg swelling.  Gastrointestinal: Negative for nausea, vomiting, abdominal pain, diarrhea, constipation, blood in stool, abdominal distention and anal bleeding.  Genitourinary: Negative for dysuria and flank pain.  Musculoskeletal: Negative for myalgias, arthralgias and gait problem.  Skin: Negative for color change and rash.  Neurological: Negative for dizziness and headaches.  Hematological: Negative for adenopathy. Does not bruise/bleed easily.  Psychiatric/Behavioral: Negative for suicidal ideas, sleep disturbance and dysphoric mood. The patient is not nervous/anxious.        Objective:   Physical Exam  Constitutional: She is oriented to person, place, and time. She appears well-developed and well-nourished. No distress.  HENT:  Head: Normocephalic and atraumatic.  Right Ear: External ear normal.  Left Ear: External ear normal.  Nose: Nose normal.  Mouth/Throat: Oropharynx is clear and moist. No oropharyngeal exudate.  Eyes: Conjunctivae normal are normal. Pupils are equal, round, and reactive to light. Right eye exhibits no discharge. Left eye exhibits no discharge. No scleral icterus.  Neck: Normal range of motion. Neck supple. No tracheal deviation present. No thyromegaly present.  Cardiovascular: Normal rate, regular rhythm, normal heart sounds and intact distal pulses.  Exam reveals no gallop and no friction rub.  No murmur heard. Pulmonary/Chest: Effort normal and breath sounds normal. No respiratory distress. She has no wheezes. She has no rales. She exhibits no tenderness.  Musculoskeletal: Normal range of motion. She exhibits no edema and no tenderness.  Lymphadenopathy:    She has no cervical adenopathy.  Neurological: She is alert and oriented to person, place, and time. No  cranial nerve deficit. She exhibits normal muscle tone. Coordination normal.  Skin: Skin is warm and dry. No rash noted. She is not diaphoretic. No erythema. No pallor.     Psychiatric: She has a normal mood and affect. Her behavior is normal. Judgment and thought content normal.          Assessment & Plan:

## 2012-01-01 NOTE — Assessment & Plan Note (Signed)
Blood pressure well-controlled on current medications. Will continue. 

## 2012-01-01 NOTE — Assessment & Plan Note (Signed)
Patient with palpable nodular area right medial thigh. Ultrasound showed enlarged lymph nodes. Surgical evaluation with possible biopsy is scheduled for Monday.

## 2012-01-01 NOTE — Assessment & Plan Note (Signed)
Patient reports blood sugars have been well-controlled with maximum fasting sugars near 150 after stopping metformin. We'll plan to keep her off metformin given repeated episodes of myalgia on this medication. She will monitor sugars. Blood sugars running consistently greater than 150 she will call and we will add additional medication

## 2012-01-01 NOTE — Assessment & Plan Note (Signed)
Patient reports that diffuse myalgia has resolved after stopping metformin. Will continue to monitor.

## 2012-01-04 ENCOUNTER — Observation Stay: Payer: Self-pay | Admitting: Internal Medicine

## 2012-01-04 ENCOUNTER — Telehealth: Payer: Self-pay | Admitting: Internal Medicine

## 2012-01-04 LAB — TROPONIN I
Troponin-I: <0.02 ng/mL
Troponin-I: <0.02 ng/mL

## 2012-01-04 LAB — BASIC METABOLIC PANEL
Anion Gap: 7 (ref 7–16)
BUN: 18 mg/dL (ref 7–18)
Calcium, Total: 9.2 mg/dL (ref 8.5–10.1)
Chloride: 103 mmol/L (ref 98–107)
Creatinine: 1 mg/dL (ref 0.60–1.30)
EGFR (African American): >60
EGFR (Non-African Amer.): 55 — ABNORMAL LOW
Glucose: 128 mg/dL — ABNORMAL HIGH (ref 65–99)
Osmolality: 283 (ref 275–301)
Potassium: 3.9 mmol/L (ref 3.5–5.1)
Sodium: 140 mmol/L (ref 136–145)

## 2012-01-04 LAB — CK TOTAL AND CKMB (NOT AT ARMC)
CK, Total: 334 U/L — ABNORMAL HIGH (ref 21–215)
CK, Total: 240 U/L — ABNORMAL HIGH (ref 21–215)
CK-MB: 4.4 ng/mL — ABNORMAL HIGH (ref 0.5–3.6)
CK-MB: 3 ng/mL (ref 0.5–3.6)

## 2012-01-04 LAB — CBC
HCT: 40.9 % (ref 35.0–47.0)
HGB: 13.6 g/dL (ref 12.0–16.0)
MCH: 29.9 pg (ref 26.0–34.0)
MCHC: 33.3 g/dL (ref 32.0–36.0)
MCV: 90 fL (ref 80–100)
Platelet: 221 10*3/uL (ref 150–440)
RBC: 4.56 10*6/uL (ref 3.80–5.20)
RDW: 14.8 % — ABNORMAL HIGH (ref 11.5–14.5)
WBC: 6.1 10*3/uL (ref 3.6–11.0)

## 2012-01-04 NOTE — Telephone Encounter (Signed)
error 

## 2012-01-05 ENCOUNTER — Telehealth: Payer: Self-pay | Admitting: Internal Medicine

## 2012-01-05 DIAGNOSIS — R079 Chest pain, unspecified: Secondary | ICD-10-CM

## 2012-01-05 LAB — TROPONIN I
Troponin-I: <0.02 ng/mL
Troponin-I: <0.02 ng/mL

## 2012-01-05 LAB — CK TOTAL AND CKMB (NOT AT ARMC)
CK, Total: 216 U/L — ABNORMAL HIGH (ref 21–215)
CK, Total: 192 U/L (ref 21–215)
CK-MB: 2.1 ng/mL (ref 0.5–3.6)
CK-MB: 1.5 ng/mL (ref 0.5–3.6)

## 2012-01-05 NOTE — Telephone Encounter (Signed)
For your information  

## 2012-01-05 NOTE — Telephone Encounter (Signed)
Hospital follow up 11/29 pt discharged [facility] 11/20

## 2012-01-06 ENCOUNTER — Encounter: Payer: Self-pay | Admitting: Internal Medicine

## 2012-01-14 ENCOUNTER — Encounter: Payer: Self-pay | Admitting: Internal Medicine

## 2012-01-14 ENCOUNTER — Ambulatory Visit (INDEPENDENT_AMBULATORY_CARE_PROVIDER_SITE_OTHER): Payer: Medicare Other | Admitting: Internal Medicine

## 2012-01-14 VITALS — BP 128/82 | HR 62 | Temp 97.6°F | Resp 15 | Wt 175.5 lb

## 2012-01-14 DIAGNOSIS — R918 Other nonspecific abnormal finding of lung field: Secondary | ICD-10-CM

## 2012-01-14 DIAGNOSIS — R0789 Other chest pain: Secondary | ICD-10-CM | POA: Insufficient documentation

## 2012-01-14 DIAGNOSIS — R9389 Abnormal findings on diagnostic imaging of other specified body structures: Secondary | ICD-10-CM | POA: Insufficient documentation

## 2012-01-14 DIAGNOSIS — E119 Type 2 diabetes mellitus without complications: Secondary | ICD-10-CM

## 2012-01-14 MED ORDER — SAXAGLIPTIN HCL 2.5 MG PO TABS: 2.5000 mg | ORAL_TABLET | Freq: Every day | ORAL | Status: DC

## 2012-01-14 NOTE — Assessment & Plan Note (Signed)
Blood sugars slightly elevated off metformin with a few sugars near 200 each week. Will add Onglyza, as pt has responded well to this before. She will continue to monitor BG and call if BG>200 consistently or if any concerns about the medication. Plan for follow up 1 month.

## 2012-01-14 NOTE — Assessment & Plan Note (Signed)
Noted to have abnormal CXR with right hilar thickness at [facility] 12/2011 during admission. Needs repeat 6 weeks, 02/2012.

## 2012-01-14 NOTE — Progress Notes (Signed)
Subjective:    Patient ID: [name], female    DOB: April 25, 1934, 77 y.o.   MRN: [id number]  HPI 77 year old female with history of diabetes, hypertension presents for followup after recent admission at [facility] for chest pain. Patient reports that she was having intermittent episodes of exertional chest tightness, and diaphoresis. She develops symptoms the day of admission which were persistent and also associated with nausea. On admission, she had cardiac markers and stress test which were normal. Pain was thought possibly secondary to musculoskeletal strain. Since her discharge home, she reports persistent intermittent episodes of chest pain. These are usually brief and associated with exertion. They occasionally occur at rest. She is not currently taking any medication for chest pain. She reports full compliance with her other medications. She was recently taken off metformin because of nausea and muscle pain with this medication. Her blood sugars have been running slightly higher with a few blood sugars each week near 200. She notes that she tolerated Onglyza well in the past and questions whether he may be helpful to go back on this medication.  She also recently complained of a mass on her right upper thigh. She recently was evaluated by general surgery and is scheduled for biopsy next week.  Outpatient Encounter Prescriptions as of 01/14/2012  Medication Sig Dispense Refill  . aspirin 81 MG tablet Take 81 mg by mouth daily.      Marland Kitchen atenolol (TENORMIN) 50 MG tablet Take 1 tablet (50 mg total) by mouth daily.  90 tablet  3  . bimatoprost (LUMIGAN) 0.01 % SOLN Place 1 drop into both eyes at bedtime.      Marland Kitchen BIOTIN 5000 PO Take 1 tablet by mouth daily.      Marland Kitchen levothyroxine (SYNTHROID, LEVOTHROID) 100 MCG tablet Take 1 tablet (100 mcg total) by mouth daily.  90 tablet  4  . losartan-hydrochlorothiazide (HYZAAR) 50-12.5 MG per tablet Take 1 tablet by mouth daily.  90 tablet   4  . montelukast (SINGULAIR) 10 MG tablet Take 1 tablet (10 mg total) by mouth daily.  90 tablet  4  . Multiple Vitamins-Minerals (MULTIVITAMIN PO) Take 1 tablet by mouth daily.      . Timolol Maleate (ISTALOL) 0.5 % (DAILY) SOLN Apply 1 drop to eye daily.      . saxagliptin HCl (ONGLYZA) 2.5 MG TABS tablet Take 1 tablet (2.5 mg total) by mouth daily.  90 tablet  3   BP 128/82  Pulse 62  Temp 97.6 F (36.4 C) (Oral)  Resp 15  Wt 175 lb 8 oz (79.606 kg)  Review of Systems  Constitutional: Negative for fever, chills, appetite change, fatigue and unexpected weight change.  HENT: Negative for ear pain, congestion, sore throat, trouble swallowing, neck pain, voice change and sinus pressure.   Eyes: Negative for visual disturbance.  Respiratory: Positive for chest tightness and shortness of breath. Negative for cough, wheezing and stridor.   Cardiovascular: Positive for chest pain. Negative for palpitations and leg swelling.  Gastrointestinal: Negative for nausea, vomiting, abdominal pain, diarrhea, constipation, blood in stool, abdominal distention and anal bleeding.  Genitourinary: Negative for dysuria and flank pain.  Musculoskeletal: Negative for myalgias, arthralgias and gait problem.  Skin: Negative for color change and rash.  Neurological: Negative for dizziness and headaches.  Hematological: Negative for adenopathy. Does not bruise/bleed easily.  Psychiatric/Behavioral: Negative for suicidal ideas, sleep disturbance and dysphoric mood. The patient is not nervous/anxious.        Objective:  Physical Exam  Constitutional: She is oriented to person, place, and time. She appears well-developed and well-nourished. No distress.  HENT:  Head: Normocephalic and atraumatic.  Right Ear: External ear normal.  Left Ear: External ear normal.  Nose: Nose normal.  Mouth/Throat: Oropharynx is clear and moist. No oropharyngeal exudate.  Eyes: Conjunctivae normal are normal. Pupils are equal,  round, and reactive to light. Right eye exhibits no discharge. Left eye exhibits no discharge. No scleral icterus.  Neck: Normal range of motion. Neck supple. No tracheal deviation present. No thyromegaly present.  Cardiovascular: Normal rate, regular rhythm, normal heart sounds and intact distal pulses.  Exam reveals no gallop and no friction rub.   No murmur heard. Pulmonary/Chest: Effort normal and breath sounds normal. No respiratory distress. She has no wheezes. She has no rales. She exhibits no tenderness.  Musculoskeletal: Normal range of motion. She exhibits no edema and no tenderness.  Lymphadenopathy:    She has no cervical adenopathy.  Neurological: She is alert and oriented to person, place, and time. No cranial nerve deficit. She exhibits normal muscle tone. Coordination normal.  Skin: Skin is warm and dry. No rash noted. She is not diaphoretic. No erythema. No pallor.  Psychiatric: She has a normal mood and affect. Her behavior is normal. Judgment and thought content normal.          Assessment & Plan:

## 2012-01-14 NOTE — Assessment & Plan Note (Signed)
Symptoms of intermittent exertional chest tightness, dyspnea, and fatigue are concerning for myocardial ischemia. Recent stress test at [facility] was read as normal, however pt has risk factors including DM, family history. Question if cardiac catheterization may be warranted for further evaluation. Will set up cardiology evaluation.

## 2012-01-19 ENCOUNTER — Encounter: Payer: Self-pay | Admitting: *Deleted

## 2012-01-24 ENCOUNTER — Ambulatory Visit: Payer: Medicare Other | Admitting: Cardiovascular Disease

## 2012-02-21 ENCOUNTER — Other Ambulatory Visit: Payer: Self-pay | Admitting: Internal Medicine

## 2012-02-21 NOTE — Telephone Encounter (Signed)
[name] called to inform the the Dr. That a sheet for the patient's diabetic supplies was on its way and that his wife is almost out of her supplies .

## 2012-02-22 ENCOUNTER — Telehealth: Payer: Self-pay | Admitting: *Deleted

## 2012-02-22 NOTE — Telephone Encounter (Signed)
CMP, A1c, lipids 250.00 272.4

## 2012-02-22 NOTE — Telephone Encounter (Signed)
Pt is coming in for labs Thursday (01.09.2014) what labs and dx would you like?  Thank you

## 2012-02-23 ENCOUNTER — Other Ambulatory Visit: Payer: Self-pay | Admitting: *Deleted

## 2012-02-23 ENCOUNTER — Telehealth: Payer: Self-pay | Admitting: *Deleted

## 2012-02-23 DIAGNOSIS — E119 Type 2 diabetes mellitus without complications: Secondary | ICD-10-CM

## 2012-02-23 DIAGNOSIS — E785 Hyperlipidemia, unspecified: Secondary | ICD-10-CM

## 2012-02-23 NOTE — Telephone Encounter (Signed)
CMP, A1c 250.00 

## 2012-02-23 NOTE — Telephone Encounter (Signed)
Spoke with patient and she stated that the company that supplies her diabetic supplies will be sending a form to our office.  She will speak with her spouse when he comes home and give [place] a call back with more information.

## 2012-02-23 NOTE — Telephone Encounter (Signed)
Pt is coming in for labs tomorrow (02-24-2012) what labs and dx would you like? Thank you

## 2012-02-23 NOTE — Telephone Encounter (Signed)
Spoke with patients spouse and the diabetic form was faxed to [employer] on 02/22/2012.

## 2012-02-24 ENCOUNTER — Other Ambulatory Visit (INDEPENDENT_AMBULATORY_CARE_PROVIDER_SITE_OTHER): Payer: Medicare Other

## 2012-02-24 DIAGNOSIS — E785 Hyperlipidemia, unspecified: Secondary | ICD-10-CM

## 2012-02-24 DIAGNOSIS — E119 Type 2 diabetes mellitus without complications: Secondary | ICD-10-CM

## 2012-02-24 LAB — HEMOGLOBIN A1C: Hgb A1c MFr Bld: 6.7 % — ABNORMAL HIGH (ref 4.6–6.5)

## 2012-02-24 LAB — COMPREHENSIVE METABOLIC PANEL
ALT: 39 U/L — ABNORMAL HIGH (ref 0–35)
Albumin: 3.9 g/dL (ref 3.5–5.2)
Alkaline Phosphatase: 60 U/L (ref 39–117)
CO2: 27 mEq/L (ref 19–32)
Glucose, Bld: 128 mg/dL — ABNORMAL HIGH (ref 70–99)
Potassium: 4 mEq/L (ref 3.5–5.1)
Sodium: 139 mEq/L (ref 135–145)
Total Protein: 7 g/dL (ref 6.0–8.3)

## 2012-02-24 LAB — LIPID PANEL
Total CHOL/HDL Ratio: 3
VLDL: 14 mg/dL (ref 0.0–40.0)

## 2012-02-29 ENCOUNTER — Ambulatory Visit (INDEPENDENT_AMBULATORY_CARE_PROVIDER_SITE_OTHER): Payer: Medicare Other | Admitting: Internal Medicine

## 2012-02-29 ENCOUNTER — Encounter: Payer: Self-pay | Admitting: Internal Medicine

## 2012-02-29 ENCOUNTER — Telehealth: Payer: Self-pay | Admitting: Internal Medicine

## 2012-02-29 VITALS — BP 120/86 | HR 66 | Temp 98.7°F | Ht 63.75 in | Wt 175.8 lb

## 2012-02-29 DIAGNOSIS — M799 Soft tissue disorder, unspecified: Secondary | ICD-10-CM

## 2012-02-29 DIAGNOSIS — M7989 Other specified soft tissue disorders: Secondary | ICD-10-CM

## 2012-02-29 DIAGNOSIS — Z13820 Encounter for screening for osteoporosis: Secondary | ICD-10-CM

## 2012-02-29 DIAGNOSIS — R918 Other nonspecific abnormal finding of lung field: Secondary | ICD-10-CM

## 2012-02-29 DIAGNOSIS — M810 Age-related osteoporosis without current pathological fracture: Secondary | ICD-10-CM

## 2012-02-29 DIAGNOSIS — R9389 Abnormal findings on diagnostic imaging of other specified body structures: Secondary | ICD-10-CM

## 2012-02-29 DIAGNOSIS — E119 Type 2 diabetes mellitus without complications: Secondary | ICD-10-CM

## 2012-02-29 DIAGNOSIS — I10 Essential (primary) hypertension: Secondary | ICD-10-CM

## 2012-02-29 MED ORDER — SAXAGLIPTIN HCL 5 MG PO TABS: 5.0000 mg | ORAL_TABLET | Freq: Every day | ORAL | Status: DC

## 2012-02-29 NOTE — Telephone Encounter (Signed)
I reviewed previous chest xray from [facility]. Pt needs a follow up xray this month. I would prefer [facility]. I have sent pt a message, but wanted to call to be sure this is completed.

## 2012-02-29 NOTE — Assessment & Plan Note (Signed)
S/p biopsy, path showed lipoma per pt report. Will request records on this.

## 2012-02-29 NOTE — Assessment & Plan Note (Signed)
Lab Results  Component Value Date   HGBA1C 6.7* 02/24/2012   Blood sugars well controlled on Onglyza. Will continue. Encouraged healthy diet and regular physical activity with goal of 3 x per week. Follow up 3 months and prn.

## 2012-02-29 NOTE — Telephone Encounter (Signed)
Patient advised as instructed via telephone, she will go have cxr done this week.

## 2012-02-29 NOTE — Progress Notes (Signed)
Subjective:    Patient ID: [name], female    DOB: 06/05/1934, 78 y.o.   MRN: [id number]  HPI 78YO female with h/o HTN, DM, hypothyroidism presents for follow up. Doing well. BG well controlled with fasting BG near 100. Following healthy diet. Some exercise with biking.  Notes that biopsy of recent right thigh soft tissue mass was benign. Had follow up with surgeon yesterday.  Notes some persistent bilateral calf cramping at night. Not currently taking any medication for this.  No further episodes of chest pain.  Outpatient Encounter Prescriptions as of 02/29/2012  Medication Sig Dispense Refill  . aspirin 81 MG tablet Take 81 mg by mouth daily.      Marland Kitchen atenolol (TENORMIN) 50 MG tablet Take 1 tablet (50 mg total) by mouth daily.  90 tablet  3  . bimatoprost (LUMIGAN) 0.01 % SOLN Place 1 drop into both eyes at bedtime.      Marland Kitchen BIOTIN 5000 PO Take 1 tablet by mouth daily.      Marland Kitchen levothyroxine (SYNTHROID, LEVOTHROID) 100 MCG tablet Take 1 tablet (100 mcg total) by mouth daily.  90 tablet  4  . losartan-hydrochlorothiazide (HYZAAR) 50-12.5 MG per tablet Take 1 tablet by mouth daily.  90 tablet  4  . montelukast (SINGULAIR) 10 MG tablet Take 1 tablet (10 mg total) by mouth daily.  90 tablet  4  . Multiple Vitamins-Minerals (MULTIVITAMIN PO) Take 1 tablet by mouth daily.      . saxagliptin HCl (ONGLYZA) 5 MG TABS tablet Take 1 tablet (5 mg total) by mouth daily.  90 tablet  3  . Timolol Maleate (ISTALOL) 0.5 % (DAILY) SOLN Apply 1 drop to eye daily.       BP 120/86  Pulse 66  Temp 98.7 F (37.1 C) (Oral)  Ht 5' 3.75" (1.619 m)  Wt 175 lb 12 oz (79.72 kg)  BMI 30.40 kg/m2  SpO2 97%  Review of Systems  Constitutional: Negative for fever, chills, appetite change, fatigue and unexpected weight change.  HENT: Negative for ear pain, congestion, sore throat, trouble swallowing, neck pain, voice change and sinus pressure.   Eyes: Negative for visual disturbance.  Respiratory: Negative for  cough, shortness of breath, wheezing and stridor.   Cardiovascular: Negative for chest pain, palpitations and leg swelling.  Gastrointestinal: Negative for nausea, vomiting, abdominal pain, diarrhea, constipation, blood in stool, abdominal distention and anal bleeding.  Genitourinary: Negative for dysuria and flank pain.  Musculoskeletal: Positive for myalgias (occasional muscle cramping in legs at night). Negative for arthralgias and gait problem.  Skin: Negative for color change and rash.  Neurological: Negative for dizziness and headaches.  Hematological: Negative for adenopathy. Does not bruise/bleed easily.  Psychiatric/Behavioral: Negative for suicidal ideas, sleep disturbance and dysphoric mood. The patient is not nervous/anxious.        Objective:   Physical Exam  Constitutional: She is oriented to person, place, and time. She appears well-developed and well-nourished. No distress.  HENT:  Head: Normocephalic and atraumatic.  Right Ear: External ear normal.  Left Ear: External ear normal.  Nose: Nose normal.  Mouth/Throat: Oropharynx is clear and moist. No oropharyngeal exudate.  Eyes: Conjunctivae normal are normal. Pupils are equal, round, and reactive to light. Right eye exhibits no discharge. Left eye exhibits no discharge. No scleral icterus.  Neck: Normal range of motion. Neck supple. No tracheal deviation present. No thyromegaly present.  Cardiovascular: Normal rate, regular rhythm, normal heart sounds and intact distal pulses.  Exam reveals no  gallop and no friction rub.   No murmur heard. Pulmonary/Chest: Effort normal and breath sounds normal. No respiratory distress. She has no wheezes. She has no rales. She exhibits no tenderness.  Musculoskeletal: Normal range of motion. She exhibits no edema and no tenderness.  Lymphadenopathy:    She has no cervical adenopathy.  Neurological: She is alert and oriented to person, place, and time. No cranial nerve deficit. She  exhibits normal muscle tone. Coordination normal.  Skin: Skin is warm and dry. No rash noted. She is not diaphoretic. No erythema. No pallor.  Psychiatric: She has a normal mood and affect. Her behavior is normal. Judgment and thought content normal.          Assessment & Plan:

## 2012-02-29 NOTE — Assessment & Plan Note (Signed)
Repeat CXR ordered

## 2012-02-29 NOTE — Assessment & Plan Note (Signed)
BP Readings from Last 3 Encounters:  02/29/12 120/86  01/14/12 128/82  12/31/11 110/62   BP well controlled on current medications. Will continue. Follow up 3 months and prn.

## 2012-03-02 ENCOUNTER — Ambulatory Visit (INDEPENDENT_AMBULATORY_CARE_PROVIDER_SITE_OTHER)
Admission: RE | Admit: 2012-03-02 | Discharge: 2012-03-02 | Disposition: A | Discharge status: Home / Self Care | Payer: Medicare Other | Source: Ambulatory Visit | Attending: Internal Medicine | Admitting: Internal Medicine

## 2012-03-02 ENCOUNTER — Other Ambulatory Visit: Payer: Self-pay | Admitting: Internal Medicine

## 2012-03-02 DIAGNOSIS — R9389 Abnormal findings on diagnostic imaging of other specified body structures: Secondary | ICD-10-CM

## 2012-03-02 DIAGNOSIS — R918 Other nonspecific abnormal finding of lung field: Secondary | ICD-10-CM

## 2012-03-03 ENCOUNTER — Telehealth: Payer: Self-pay | Admitting: Internal Medicine

## 2012-03-03 NOTE — Telephone Encounter (Signed)
Discussed recent CXR results with pt over the phone. She is fine to proceed with CT chest.

## 2012-03-14 ENCOUNTER — Ambulatory Visit (HOSPITAL_COMMUNITY): Payer: Medicare Other

## 2012-03-16 NOTE — Telephone Encounter (Signed)
Was the CT chest ever completed?

## 2012-03-21 ENCOUNTER — Ambulatory Visit: Payer: Self-pay | Admitting: Internal Medicine

## 2012-03-22 ENCOUNTER — Ambulatory Visit (HOSPITAL_COMMUNITY)
Admission: RE | Admit: 2012-03-22 | Discharge: 2012-03-22 | Disposition: A | Discharge status: Home / Self Care | Payer: Medicare Other | Source: Ambulatory Visit | Attending: Internal Medicine | Admitting: Internal Medicine

## 2012-03-22 ENCOUNTER — Telehealth: Payer: Self-pay | Admitting: Internal Medicine

## 2012-03-22 DIAGNOSIS — R9389 Abnormal findings on diagnostic imaging of other specified body structures: Secondary | ICD-10-CM

## 2012-03-22 DIAGNOSIS — R918 Other nonspecific abnormal finding of lung field: Secondary | ICD-10-CM | POA: Insufficient documentation

## 2012-03-22 DIAGNOSIS — R935 Abnormal findings on diagnostic imaging of other abdominal regions, including retroperitoneum: Secondary | ICD-10-CM | POA: Insufficient documentation

## 2012-03-22 MED ORDER — IOHEXOL 300 MG/ML  SOLN
80.0000 mL | Freq: Once | INTRAMUSCULAR | Status: AC | PRN
  Administered 2012-03-22: 80 mL via INTRAVENOUS

## 2012-03-22 NOTE — Telephone Encounter (Signed)
Bone density test T-score -1.5

## 2012-04-03 ENCOUNTER — Encounter: Payer: Self-pay | Admitting: Internal Medicine

## 2012-05-19 ENCOUNTER — Encounter: Payer: Self-pay | Admitting: Adult Health

## 2012-05-19 ENCOUNTER — Ambulatory Visit (INDEPENDENT_AMBULATORY_CARE_PROVIDER_SITE_OTHER): Payer: Medicare Other | Admitting: Adult Health

## 2012-05-19 VITALS — BP 126/75 | HR 67 | Temp 98.7°F | Resp 16 | Ht 63.75 in

## 2012-05-19 DIAGNOSIS — J029 Acute pharyngitis, unspecified: Secondary | ICD-10-CM

## 2012-05-19 DIAGNOSIS — R6889 Other general symptoms and signs: Secondary | ICD-10-CM

## 2012-05-19 DIAGNOSIS — J111 Influenza due to unidentified influenza virus with other respiratory manifestations: Secondary | ICD-10-CM

## 2012-05-19 LAB — POCT INFLUENZA A/B: Influenza A, POC: NEGATIVE

## 2012-05-19 MED ORDER — AZITHROMYCIN 250 MG PO TABS: ORAL_TABLET | ORAL | Status: DC

## 2012-05-19 NOTE — Progress Notes (Signed)
  Subjective:    Patient ID: [name], female    DOB: 10-21-1934, 78 y.o.   MRN: [id number]  HPI Patient is a pleasant 78 year old female who presents to clinic with complaints of general malaise, fever, chills, sore throat and mild cough. She just recently returned home from a trip to [place]. She had seen her dentist while in [place] for problems with a root canal. She reports that she was informed that she had an infection at the root canal site. She was started on Amoxil x10 days. Patient started the antibiotics on Tuesday and she reports feeling worse since beginning this medication. She denies rash but reports significant stomach upset.   Current Outpatient Prescriptions on File Prior to Visit  Medication Sig Dispense Refill  . aspirin 81 MG tablet Take 81 mg by mouth daily.      Marland Kitchen atenolol (TENORMIN) 50 MG tablet Take 1 tablet (50 mg total) by mouth daily.  90 tablet  3  . bimatoprost (LUMIGAN) 0.01 % SOLN Place 1 drop into both eyes at bedtime.      Marland Kitchen BIOTIN 5000 PO Take 1 tablet by mouth daily.      Marland Kitchen levothyroxine (SYNTHROID, LEVOTHROID) 100 MCG tablet Take 1 tablet (100 mcg total) by mouth daily.  90 tablet  4  . losartan-hydrochlorothiazide (HYZAAR) 50-12.5 MG per tablet Take 1 tablet by mouth daily.  90 tablet  4  . montelukast (SINGULAIR) 10 MG tablet Take 1 tablet (10 mg total) by mouth daily.  90 tablet  4  . Timolol Maleate (ISTALOL) 0.5 % (DAILY) SOLN Apply 1 drop to eye daily.      . Multiple Vitamins-Minerals (MULTIVITAMIN PO) Take 1 tablet by mouth daily.      . saxagliptin HCl (ONGLYZA) 5 MG TABS tablet Take 1 tablet (5 mg total) by mouth daily.  90 tablet  3   No current facility-administered medications on file prior to visit.     Review of Systems  Constitutional: Positive for fever, chills and fatigue.  HENT: Positive for sore throat.   Respiratory: Positive for cough. Negative for shortness of breath and wheezing.   Cardiovascular: Negative for chest  pain.  Gastrointestinal: Positive for nausea. Negative for vomiting.  Psychiatric/Behavioral: Negative.    BP 126/75  Pulse 67  Temp(Src) 98.7 F (37.1 C) (Oral)  Resp 16  Ht 5' 3.75" (1.619 m)  SpO2 96%    Objective:   Physical Exam  Constitutional: She is oriented to person, place, and time. She appears well-developed and well-nourished.  Appears uncomfortable and not feeling well.  HENT:  Head: Normocephalic and atraumatic.  Right Ear: External ear normal.  Left Ear: External ear normal.  Pharyngeal erythema without exudate.  Cardiovascular: Normal rate, regular rhythm and normal heart sounds.   No murmur heard. Pulmonary/Chest: Effort normal and breath sounds normal. No respiratory distress. She has no wheezes. She has no rales.  Abdominal: Soft. Bowel sounds are normal.  Lymphadenopathy:    She has no cervical adenopathy.  Neurological: She is oriented to person, place, and time.  Skin: Skin is warm and dry. No rash noted.  Psychiatric: She has a normal mood and affect. Her behavior is normal. Judgment and thought content normal.       Assessment & Plan:

## 2012-05-19 NOTE — Assessment & Plan Note (Signed)
Rapid strep test negative. Negative for influenza. It appears that patient is exhibiting an intolerance to the Amoxil. I will switch her to azithromycin which will also cover dental abscesses. She will be seeing a local dentist to evaluate the problem with her root canal.

## 2012-05-24 ENCOUNTER — Telehealth: Payer: Self-pay | Admitting: *Deleted

## 2012-05-24 NOTE — Telephone Encounter (Signed)
Pt is coming in tomorrow 04.10.2014 for labs what dx and and labs would you like?

## 2012-05-25 ENCOUNTER — Other Ambulatory Visit: Payer: Self-pay | Admitting: *Deleted

## 2012-05-25 ENCOUNTER — Other Ambulatory Visit (INDEPENDENT_AMBULATORY_CARE_PROVIDER_SITE_OTHER): Payer: Medicare Other

## 2012-05-25 DIAGNOSIS — E119 Type 2 diabetes mellitus without complications: Secondary | ICD-10-CM

## 2012-05-25 LAB — COMPREHENSIVE METABOLIC PANEL
Albumin: 4 g/dL (ref 3.5–5.2)
Alkaline Phosphatase: 62 U/L (ref 39–117)
BUN: 17 mg/dL (ref 6–23)
CO2: 29 mEq/L (ref 19–32)
Calcium: 9.1 mg/dL (ref 8.4–10.5)
Chloride: 102 mEq/L (ref 96–112)
GFR: 64.82 mL/min (ref >60.00)
Glucose, Bld: 119 mg/dL — ABNORMAL HIGH (ref 70–99)
Potassium: 4.3 mEq/L (ref 3.5–5.1)
Sodium: 138 mEq/L (ref 135–145)
Total Protein: 7.2 g/dL (ref 6.0–8.3)

## 2012-05-25 LAB — HEMOGLOBIN A1C: Hgb A1c MFr Bld: 6.9 % — ABNORMAL HIGH (ref 4.6–6.5)

## 2012-05-25 NOTE — Telephone Encounter (Signed)
Cmp, a1c 250.00

## 2012-06-01 ENCOUNTER — Encounter: Payer: Self-pay | Admitting: Internal Medicine

## 2012-06-01 ENCOUNTER — Ambulatory Visit (INDEPENDENT_AMBULATORY_CARE_PROVIDER_SITE_OTHER): Payer: Medicare Other | Admitting: Internal Medicine

## 2012-06-01 VITALS — BP 130/90 | HR 60 | Temp 98.4°F | Wt 173.0 lb

## 2012-06-01 DIAGNOSIS — J45909 Unspecified asthma, uncomplicated: Secondary | ICD-10-CM

## 2012-06-01 DIAGNOSIS — E039 Hypothyroidism, unspecified: Secondary | ICD-10-CM

## 2012-06-01 DIAGNOSIS — I10 Essential (primary) hypertension: Secondary | ICD-10-CM

## 2012-06-01 DIAGNOSIS — R9389 Abnormal findings on diagnostic imaging of other specified body structures: Secondary | ICD-10-CM | POA: Insufficient documentation

## 2012-06-01 DIAGNOSIS — M8580 Other specified disorders of bone density and structure, unspecified site: Secondary | ICD-10-CM | POA: Insufficient documentation

## 2012-06-01 DIAGNOSIS — E119 Type 2 diabetes mellitus without complications: Secondary | ICD-10-CM

## 2012-06-01 DIAGNOSIS — M899 Disorder of bone, unspecified: Secondary | ICD-10-CM

## 2012-06-01 MED ORDER — MONTELUKAST SODIUM 10 MG PO TABS: 10.0000 mg | ORAL_TABLET | Freq: Every day | ORAL | Status: DC

## 2012-06-01 NOTE — Patient Instructions (Signed)
Vitamin D 2000 units daily.

## 2012-06-01 NOTE — Assessment & Plan Note (Signed)
Incidental finding noted of pancreatic cyst. Radiology recommended follow up CT in 1 year, 02/2013.

## 2012-06-01 NOTE — Progress Notes (Signed)
Subjective:    Patient ID: [name], female    DOB: 02-12-1935, 78 y.o.   MRN: [id number]  HPI 78 year old female with history of diabetes, hypertension, hypothyroidism presents for followup. She reports she is generally feeling well. She has not been taking her Onglyza. She reports that fasting blood sugars have been near 110 and postprandial blood sugars have been near 140. She tries to follow a healthy diet and getting regular physical activity with frequent golfing. She denies any new concerns today.  Outpatient Encounter Prescriptions as of 06/01/2012  Medication Sig Dispense Refill  . aspirin 81 MG tablet Take 81 mg by mouth daily.      Marland Kitchen atenolol (TENORMIN) 50 MG tablet Take 1 tablet (50 mg total) by mouth daily.  90 tablet  3  . bimatoprost (LUMIGAN) 0.01 % SOLN Place 1 drop into both eyes at bedtime.      Marland Kitchen BIOTIN 5000 PO Take 1 tablet by mouth daily.      Marland Kitchen levothyroxine (SYNTHROID, LEVOTHROID) 100 MCG tablet Take 1 tablet (100 mcg total) by mouth daily.  90 tablet  4  . losartan-hydrochlorothiazide (HYZAAR) 50-12.5 MG per tablet Take 1 tablet by mouth daily.  90 tablet  4  . montelukast (SINGULAIR) 10 MG tablet Take 1 tablet (10 mg total) by mouth daily.  90 tablet  4  . Timolol Maleate (ISTALOL) 0.5 % (DAILY) SOLN Apply 1 drop to eye daily.       No facility-administered encounter medications on file as of 06/01/2012.   BP 130/90  Pulse 60  Temp(Src) 98.4 F (36.9 C) (Oral)  Wt 173 lb (78.472 kg)  BMI 29.94 kg/m2  SpO2 99%  Review of Systems  Constitutional: Negative for fever, chills, appetite change, fatigue and unexpected weight change.  HENT: Negative for ear pain, congestion, sore throat, trouble swallowing, neck pain, voice change and sinus pressure.   Eyes: Negative for visual disturbance.  Respiratory: Negative for cough, shortness of breath, wheezing and stridor.   Cardiovascular: Negative for chest pain, palpitations and leg swelling.  Gastrointestinal:  Negative for nausea, vomiting, abdominal pain, diarrhea, constipation, blood in stool, abdominal distention and anal bleeding.  Genitourinary: Negative for dysuria and flank pain.  Musculoskeletal: Negative for myalgias, arthralgias and gait problem.  Skin: Negative for color change and rash.  Neurological: Negative for dizziness and headaches.  Hematological: Negative for adenopathy. Does not bruise/bleed easily.  Psychiatric/Behavioral: Negative for suicidal ideas, sleep disturbance and dysphoric mood. The patient is not nervous/anxious.        Objective:   Physical Exam  Constitutional: She is oriented to person, place, and time. She appears well-developed and well-nourished. No distress.  HENT:  Head: Normocephalic and atraumatic.  Right Ear: External ear normal.  Left Ear: External ear normal.  Nose: Nose normal.  Mouth/Throat: Oropharynx is clear and moist. No oropharyngeal exudate.  Eyes: Conjunctivae are normal. Pupils are equal, round, and reactive to light. Right eye exhibits no discharge. Left eye exhibits no discharge. No scleral icterus.  Neck: Normal range of motion. Neck supple. No tracheal deviation present. No thyromegaly present.  Cardiovascular: Normal rate, regular rhythm, normal heart sounds and intact distal pulses.  Exam reveals no gallop and no friction rub.   No murmur heard. Pulmonary/Chest: Effort normal and breath sounds normal. No accessory muscle usage. Not tachypneic. No respiratory distress. She has no decreased breath sounds. She has no wheezes. She has no rhonchi. She has no rales. She exhibits no tenderness.  Musculoskeletal: Normal range  of motion. She exhibits no edema and no tenderness.  Lymphadenopathy:    She has no cervical adenopathy.  Neurological: She is alert and oriented to person, place, and time. No cranial nerve deficit. She exhibits normal muscle tone. Coordination normal.  Skin: Skin is warm and dry. No rash noted. She is not diaphoretic.  No erythema. No pallor.  Psychiatric: She has a normal mood and affect. Her behavior is normal. Judgment and thought content normal.          Assessment & Plan:

## 2012-06-01 NOTE — Assessment & Plan Note (Signed)
Bone density test showed T-score -1.5 in 03/2012. Discussed adequate intake of Vit D and Calcium in the diet.  Pt has not been able to tolerate bisphosponates because of glaucoma. Will continue Ca and Vit D and physical activity. Repeat bone density 2016.

## 2012-06-01 NOTE — Assessment & Plan Note (Signed)
BP Readings from Last 3 Encounters:  06/01/12 130/90  05/19/12 126/75  02/29/12 120/86   BP well controlled on current medications. Will continue.

## 2012-06-01 NOTE — Assessment & Plan Note (Signed)
Lab Results  Component Value Date   HGBA1C 6.9* 05/25/2012   BG very well controlled without use of medication. Pt had intolerance to metformin. Will continue to monitor BG for now. If fasting BG consistently >150 at any point, she will call. Follow up 3 months.

## 2012-06-27 ENCOUNTER — Other Ambulatory Visit: Payer: Self-pay | Admitting: Internal Medicine

## 2012-06-27 MED ORDER — AMOXICILLIN 500 MG PO TABS: 2000.0000 mg | ORAL_TABLET | Freq: Once | ORAL | Status: DC

## 2012-06-27 NOTE — Telephone Encounter (Signed)
We can call in Amoxicillin 2gm by mouth 1 hour prior to procedure. Please make sure no penicillin allergy

## 2012-06-27 NOTE — Telephone Encounter (Signed)
Amoxicillin 2000mg  (2gm) (typically 4x 500mg  tablets) given by mouth one hour before dental procedure.

## 2012-06-27 NOTE — Telephone Encounter (Signed)
Pt called she stated she needs dr walker to call in antibodic before she has eye surgery [facility]

## 2012-06-27 NOTE — Telephone Encounter (Signed)
Patient stated she was not allergic to penicillin but when I put the order in, it came up as a contraindication with an allergy to Cefprozil.

## 2012-06-27 NOTE — Telephone Encounter (Signed)
She is going to have eye surgery next week, she need antibiotics before having any surgery. Her other doctor in the [place] recommended that she have an abx before having any surgery because she has a heart murmur

## 2012-06-27 NOTE — Telephone Encounter (Signed)
Patient informed and would like this called in to the [facility] in [place]. Could you please clarify the prescription?

## 2012-07-04 ENCOUNTER — Ambulatory Visit: Payer: Self-pay

## 2012-07-15 LAB — HM DIABETES EYE EXAM

## 2012-08-31 ENCOUNTER — Telehealth: Payer: Self-pay | Admitting: *Deleted

## 2012-08-31 NOTE — Telephone Encounter (Signed)
Pt is coming in for labs tomorrow 07.18.2014 what labs and dx?

## 2012-09-01 ENCOUNTER — Other Ambulatory Visit (INDEPENDENT_AMBULATORY_CARE_PROVIDER_SITE_OTHER): Payer: Medicare Other

## 2012-09-01 DIAGNOSIS — E1059 Type 1 diabetes mellitus with other circulatory complications: Secondary | ICD-10-CM

## 2012-09-01 DIAGNOSIS — E039 Hypothyroidism, unspecified: Secondary | ICD-10-CM

## 2012-09-01 LAB — COMPREHENSIVE METABOLIC PANEL
ALT: 38 U/L — ABNORMAL HIGH (ref 0–35)
AST: 24 U/L (ref 0–37)
BUN: 14 mg/dL (ref 6–23)
Calcium: 9.3 mg/dL (ref 8.4–10.5)
Creatinine, Ser: 0.9 mg/dL (ref 0.4–1.2)
Total Bilirubin: 0.9 mg/dL (ref 0.3–1.2)

## 2012-09-01 LAB — HEMOGLOBIN A1C: Hgb A1c MFr Bld: 6.8 % — ABNORMAL HIGH (ref 4.6–6.5)

## 2012-09-01 NOTE — Telephone Encounter (Signed)
Ordered met c, a1c and tsh.  Had normal cholesterol in January.  Carolyn placed orders

## 2012-09-14 ENCOUNTER — Ambulatory Visit (INDEPENDENT_AMBULATORY_CARE_PROVIDER_SITE_OTHER): Payer: Medicare Other | Admitting: Internal Medicine

## 2012-09-14 ENCOUNTER — Ambulatory Visit (INDEPENDENT_AMBULATORY_CARE_PROVIDER_SITE_OTHER)
Admission: RE | Admit: 2012-09-14 | Discharge: 2012-09-14 | Disposition: A | Discharge status: Home / Self Care | Payer: Medicare Other | Source: Ambulatory Visit | Attending: Internal Medicine | Admitting: Internal Medicine

## 2012-09-14 ENCOUNTER — Encounter: Payer: Self-pay | Admitting: Internal Medicine

## 2012-09-14 VITALS — BP 148/88 | HR 57 | Temp 97.8°F | Ht 62.75 in | Wt 171.0 lb

## 2012-09-14 DIAGNOSIS — R0600 Dyspnea, unspecified: Secondary | ICD-10-CM | POA: Insufficient documentation

## 2012-09-14 DIAGNOSIS — R0989 Other specified symptoms and signs involving the circulatory and respiratory systems: Secondary | ICD-10-CM

## 2012-09-14 DIAGNOSIS — E039 Hypothyroidism, unspecified: Secondary | ICD-10-CM

## 2012-09-14 DIAGNOSIS — E119 Type 2 diabetes mellitus without complications: Secondary | ICD-10-CM

## 2012-09-14 DIAGNOSIS — Z0000 Encounter for general adult medical examination without abnormal findings: Secondary | ICD-10-CM

## 2012-09-14 DIAGNOSIS — R0609 Other forms of dyspnea: Secondary | ICD-10-CM

## 2012-09-14 DIAGNOSIS — I10 Essential (primary) hypertension: Secondary | ICD-10-CM

## 2012-09-14 LAB — HM DIABETES FOOT EXAM: HM Diabetic Foot Exam: NORMAL

## 2012-09-14 NOTE — Assessment & Plan Note (Signed)
General medical exam including breast exam normal today. Pelvic and Pap deferred given patient's age and previous normal exams. Also reviewed pelvic ultrasound from 2013 which was normal. Colonoscopy is up-to-date. Mammogram is overdue and will be scheduled today. Reviewed recent labs which showed normal kidney and liver function, excellent control of blood sugars, and normal cholesterol. Encourage continued efforts at healthy diet and regular physical activity. Appropriate screening performed.

## 2012-09-14 NOTE — Assessment & Plan Note (Signed)
Lab Results  Component Value Date   HGBA1C 6.8* 09/01/2012   BG very well controlled with diet alone. Will continue to monitor. Foot exam normal today.

## 2012-09-14 NOTE — Assessment & Plan Note (Signed)
Recent TSH normal. Continue Levothyroxine. 

## 2012-09-14 NOTE — Assessment & Plan Note (Signed)
Pt notes chronic dyspnea. Exam is normal. No h/o tobacco use or tobacco smoke exposure. Previous evaluation including treadmill stress test reportedly normal. CXR normal today. If symptoms persist, would favor getting pulmonary evaluation with PFTs. Question if CT chest might be helpful, as questionable h/o pulmonary fibrosis in pt mother.

## 2012-09-14 NOTE — Progress Notes (Signed)
Subjective:    Patient ID: [name], female    DOB: 1934/09/24, 78 y.o.   MRN: [id number]  HPI The patient is here for annual Medicare wellness examination and management of other chronic and acute problems.   The risk factors are reflected in the social history.  The roster of all physicians providing medical care to patient - is listed in the Snapshot section of the chart.  Activities of daily living:  The patient is 100% independent in all ADLs: dressing, toileting, feeding as well as independent mobility  Home safety : The patient has smoke detectors in the home. They wear seatbelts.  There are no firearms at home. There is no violence in the home.   There is no risks for hepatitis, STDs or HIV. There is no history of blood transfusion. They have no travel history to infectious disease endemic areas of the world.  The patient has not seen their dentist in the last six month. Planning to establish care.  They have seen their eye doctor in the last year. Dr. Haskel Khan - [facility] No issues with hearing. They have deferred audiologic testing in the last year.   They do not  have excessive sun exposure. Discussed the need for sun protection: hats, long sleeves and use of sunscreen if there is significant sun exposure. No dermatologist  Diet: the importance of a healthy diet is discussed. They do have a healthy diet.  The benefits of regular aerobic exercise were discussed. She uses stationary bike on occasion.   Depression screen: there are no signs or vegative symptoms of depression- irritability, change in appetite, anhedonia, sadness/tearfullness.  Cognitive assessment: the patient manages all their financial and personal affairs and is actively engaged. They could relate day,date,year and events.  The following portions of the patient's history were reviewed and updated as appropriate: allergies, current medications, past family history, past medical history,  past surgical  history, past social history  and problem list.  Visual acuity was not assessed per patient preference since she has regular follow up with her ophthalmologist. Hearing and body mass index were assessed and reviewed.   During the course of the visit the patient was educated and counseled about appropriate screening and preventive services including : fall prevention , diabetes screening, nutrition counseling, colorectal cancer screening, and recommended immunizations.    She notes a chronic history of shortness of breath. This occurs both at rest and on exertion. It has been going on for years. Her previous physician sent her for stress test which she reports was normal. She denies any history of smoking or exposure to cigarette smoke. She notes that her mother had pulmonary issues which were initially diagnosed as asthma and then ultimately diagnosed as pulmonary fibrosis.  In regards to diabetes, she reports that she has not generally been checking her blood sugars. She has not been taking any medications. She has been trying to control blood sugars with diet alone.  Outpatient Encounter Prescriptions as of 09/14/2012  Medication Sig Dispense Refill  . aspirin 81 MG tablet Take 81 mg by mouth daily.      Marland Kitchen atenolol (TENORMIN) 50 MG tablet Take 1 tablet (50 mg total) by mouth daily.  90 tablet  3  . bimatoprost (LUMIGAN) 0.01 % SOLN Place 1 drop into both eyes at bedtime.      Marland Kitchen BIOTIN 5000 PO Take 1 tablet by mouth daily.      Marland Kitchen levothyroxine (SYNTHROID, LEVOTHROID) 100 MCG tablet Take 1 tablet (  100 mcg total) by mouth daily.  90 tablet  4  . losartan-hydrochlorothiazide (HYZAAR) 50-12.5 MG per tablet Take 1 tablet by mouth daily.  90 tablet  4  . montelukast (SINGULAIR) 10 MG tablet Take 1 tablet (10 mg total) by mouth daily.  90 tablet  4  . Multiple Vitamins-Minerals (MULTIVITAMIN PO) Take 1 tablet by mouth daily.      . Timolol Maleate (ISTALOL) 0.5 % (DAILY) SOLN Apply 1 drop to eye daily.       . [DISCONTINUED] amoxicillin (AMOXIL) 500 MG tablet Take 4 tablets (2,000 mg total) by mouth once. 1 hr before procedure.  4 tablet  0   No facility-administered encounter medications on file as of 09/14/2012.     Review of Systems  Constitutional: Negative for fever, chills, appetite change, fatigue and unexpected weight change.  HENT: Negative for ear pain, congestion, sore throat, trouble swallowing, neck pain, voice change and sinus pressure.   Eyes: Negative for visual disturbance.  Respiratory: Positive for shortness of breath (chronic). Negative for cough, wheezing and stridor.   Cardiovascular: Negative for chest pain, palpitations and leg swelling.  Gastrointestinal: Negative for nausea, vomiting, abdominal pain, diarrhea, constipation, blood in stool, abdominal distention and anal bleeding.  Genitourinary: Negative for dysuria and flank pain.  Musculoskeletal: Negative for myalgias, arthralgias and gait problem.  Skin: Negative for color change and rash.  Neurological: Negative for dizziness and headaches.  Hematological: Negative for adenopathy. Does not bruise/bleed easily.  Psychiatric/Behavioral: Negative for suicidal ideas, sleep disturbance and dysphoric mood. The patient is not nervous/anxious.        Objective:   Physical Exam  Constitutional: She is oriented to person, place, and time. She appears well-developed and well-nourished. No distress.  HENT:  Head: Normocephalic and atraumatic.  Right Ear: External ear normal.  Left Ear: External ear normal.  Nose: Nose normal.  Mouth/Throat: Oropharynx is clear and moist. No oropharyngeal exudate.  Eyes: Conjunctivae are normal. Pupils are equal, round, and reactive to light. Right eye exhibits no discharge. Left eye exhibits no discharge. No scleral icterus.  Neck: Normal range of motion. Neck supple. No tracheal deviation present. No thyromegaly present.  Cardiovascular: Normal rate, regular rhythm, normal heart  sounds and intact distal pulses.  Exam reveals no gallop and no friction rub.   No murmur heard. Pulmonary/Chest: Effort normal and breath sounds normal. No accessory muscle usage. Not tachypneic. No respiratory distress. She has no decreased breath sounds. She has no wheezes. She has no rales. She exhibits no tenderness. Right breast exhibits no inverted nipple, no mass, no nipple discharge, no skin change and no tenderness. Left breast exhibits no inverted nipple, no mass, no nipple discharge, no skin change and no tenderness. Breasts are symmetrical.  Abdominal: Soft. Bowel sounds are normal. She exhibits no distension and no mass. There is no tenderness. There is no rebound and no guarding.  Musculoskeletal: Normal range of motion. She exhibits no edema and no tenderness.  Lymphadenopathy:    She has no cervical adenopathy.  Neurological: She is alert and oriented to person, place, and time. No cranial nerve deficit. She exhibits normal muscle tone. Coordination normal.  Skin: Skin is warm and dry. No rash noted. She is not diaphoretic. No erythema. No pallor.  Psychiatric: She has a normal mood and affect. Her behavior is normal. Judgment and thought content normal.          Assessment & Plan:

## 2012-09-14 NOTE — Assessment & Plan Note (Signed)
BP Readings from Last 3 Encounters:  09/14/12 148/88  06/01/12 130/90  05/19/12 126/75   BP elevated today. Will have pt monitor at home and RTC in 1 week for recheck. If persistently elevated, will increase Losartan-HCTZ to 100-25mg  daily.

## 2012-09-21 ENCOUNTER — Ambulatory Visit (INDEPENDENT_AMBULATORY_CARE_PROVIDER_SITE_OTHER): Payer: Medicare Other | Admitting: *Deleted

## 2012-09-21 VITALS — BP 104/68

## 2012-09-21 DIAGNOSIS — I10 Essential (primary) hypertension: Secondary | ICD-10-CM

## 2012-10-02 ENCOUNTER — Telehealth: Payer: Self-pay | Admitting: Internal Medicine

## 2012-10-02 DIAGNOSIS — E039 Hypothyroidism, unspecified: Secondary | ICD-10-CM

## 2012-10-02 MED ORDER — LEVOTHYROXINE SODIUM 100 MCG PO TABS: 100.0000 ug | ORAL_TABLET | Freq: Every day | ORAL | Status: DC

## 2012-10-02 NOTE — Telephone Encounter (Signed)
Rx for Levothyroxine sent to pharmacy, patient still has refill on the Singulair

## 2012-10-02 NOTE — Telephone Encounter (Signed)
Refill on Montelukast and Levothyroxine. Pt uses [facility]

## 2012-10-06 ENCOUNTER — Other Ambulatory Visit: Payer: Self-pay | Admitting: Internal Medicine

## 2012-10-06 DIAGNOSIS — J45909 Unspecified asthma, uncomplicated: Secondary | ICD-10-CM

## 2012-10-06 DIAGNOSIS — E039 Hypothyroidism, unspecified: Secondary | ICD-10-CM

## 2012-10-06 MED ORDER — LEVOTHYROXINE SODIUM 100 MCG PO TABS: 100.0000 ug | ORAL_TABLET | Freq: Every day | ORAL | Status: DC

## 2012-10-06 MED ORDER — MONTELUKAST SODIUM 10 MG PO TABS: 10.0000 mg | ORAL_TABLET | Freq: Every day | ORAL | Status: DC

## 2012-10-06 NOTE — Telephone Encounter (Signed)
Faxed

## 2012-10-06 NOTE — Telephone Encounter (Signed)
The patient will not receive her prescription in time from her mail order pharmacy. She wants a prescription for 21 days called into [facility] in [place] [phone].  levothyroxine (SYNTHROID, LEVOTHROID) 100 MCG tablet

## 2012-10-06 NOTE — Addendum Note (Signed)
Addended by: Theola Sequin on: 10/06/2012 05:14 PM   Modules accepted: Orders

## 2012-10-20 ENCOUNTER — Telehealth: Payer: Self-pay | Admitting: *Deleted

## 2012-10-20 DIAGNOSIS — J45909 Unspecified asthma, uncomplicated: Secondary | ICD-10-CM

## 2012-10-20 MED ORDER — MONTELUKAST SODIUM 10 MG PO TABS: 10.0000 mg | ORAL_TABLET | Freq: Every day | ORAL | Status: DC

## 2012-10-20 NOTE — Telephone Encounter (Signed)
Verbally given to pharmacist for 30 day supply, no refills.

## 2012-10-20 NOTE — Telephone Encounter (Signed)
Patient is in [place] visiting, requesting a prescription for Singulair to be sent to [facility] pharmacy there. Will this be ok?

## 2012-10-20 NOTE — Telephone Encounter (Signed)
Find to send Rx, however I don't know if they will honor the Rx in [place].

## 2012-11-02 ENCOUNTER — Other Ambulatory Visit: Payer: Self-pay | Admitting: *Deleted

## 2012-11-02 DIAGNOSIS — E039 Hypothyroidism, unspecified: Secondary | ICD-10-CM

## 2012-11-02 MED ORDER — LEVOTHYROXINE SODIUM 100 MCG PO TABS: 100.0000 ug | ORAL_TABLET | Freq: Every day | ORAL | Status: DC

## 2012-11-02 NOTE — Telephone Encounter (Signed)
Eprescribed.

## 2012-11-21 ENCOUNTER — Telehealth: Payer: Self-pay | Admitting: Internal Medicine

## 2012-11-21 NOTE — Telephone Encounter (Signed)
Pt is having dental work on Thursday and is needing antibiotics per her dentist before then.

## 2012-11-22 MED ORDER — AMOXICILLIN 500 MG PO TABS: 2000.0000 mg | ORAL_TABLET | Freq: Once | ORAL | Status: DC

## 2012-11-22 NOTE — Telephone Encounter (Signed)
Can another physician  write an antibiotic for this patient before her dental appointment on Thursday.

## 2012-11-22 NOTE — Telephone Encounter (Signed)
Rx sent to pharmacy by escript. Pt notified  

## 2012-11-22 NOTE — Telephone Encounter (Signed)
Pt having dental cleaning/? Root canal tomorrow morning. Dentist recommending antibiotic prior to procedure. Dr. Dan Humphreys has Rx'd Amoxicillin previously 5/14. Ok to refill?

## 2012-11-22 NOTE — Telephone Encounter (Signed)
Yes, I will prescribe

## 2012-11-29 ENCOUNTER — Telehealth: Payer: Self-pay | Admitting: Internal Medicine

## 2012-11-29 NOTE — Telephone Encounter (Signed)
IF the diarrhea persists then she should be seen. I agree the diarrhea is most likely from antibiotics.

## 2012-11-29 NOTE — Telephone Encounter (Signed)
Patient Information:  Caller Name: Percival Spanish  Phone: [phone]  Patient: [name], [name]  Gender: Female  DOB: 29-Aug-1934  Age: 78 Years  PCP: Ronna Polio (Adults only)  Office Follow Up:  Does the office need to follow up with this patient?: Yes  Instructions For The Office: Call back with MD redommendations regarding fever and mild diarrhea while taking antibiotic ordered by dentist.  RN Note:  Had one diarrhea stool after lunch and total of 3 stools so far 11/29/12. Voided at 1200. Drinking fluids. Explained diarrhea is a frequent known side effect of antibiotic use.  Instructed to test blood sugar daily while sick with infection/fever.  Symptoms  Reason For Call & Symptoms: Fever with diarrhea.  Saw dentist 11/27/12 for tooth pain; started on Amoxicillin 500 mg TID for 7 days.  FBS not tested 11/29/12;  FBS 115 11/28/12  Reviewed Health History In EMR: Yes  Reviewed Medications In EMR: Yes  Reviewed Allergies In EMR: Yes  Reviewed Surgeries / Procedures: Yes  Date of Onset of Symptoms: 11/29/2012  Treatments Tried: water  Treatments Tried Worked: No  Any Fever: Yes  Fever Taken: Oral  Fever Time Of Reading: 12:30:00  Fever Last Reading: 100.1  Guideline(s) Used:  Diarrhea  Disposition Per Guideline:   Callback by PCP Today  Reason For Disposition Reached:   Recent antibiotic therapy (i.e., within last 2 months)  Advice Given:  Fluids:  Drink more fluids, at least 8-10 glasses (8 oz or 240 ml) daily.  Supplement this with saltine crackers or soups to make certain that you are getting sufficient fluid and salt to meet your body's needs.  Avoid caffeinated beverages (Reason: caffeine is mildly dehydrating).  Nutrition:  Maintaining some food intake during episodes of diarrhea is important.  Ideal initial foods include boiled starches/cereals (e.g., potatoes, rice, noodles, wheat, oats) with a small amount of salt to taste.  Other acceptable foods include: bananas,  yogurt, crackers, soup.  Patient Will Follow Care Advice:  YES

## 2012-11-29 NOTE — Telephone Encounter (Signed)
States pt has fever and diarrhea.  States pt was scheduled for root canal for 10/16, which dentist cancelled due to pt symptoms.  States they were advised that the patient needed to be seen by primary care today.  No appt available.  Transferred to triage.

## 2012-11-29 NOTE — Telephone Encounter (Signed)
Spoke with patient she denies having any diarrhea right now and she does not have a temperature now, it was 98.8 while on the phone. The symptoms began on Monday after she began taking the antibiotics so she cancelled her root canal surgery.

## 2012-11-29 NOTE — Telephone Encounter (Signed)
Please read previous note in reference to this. DUPLICATE MESSAGES

## 2012-11-30 NOTE — Telephone Encounter (Signed)
OK. We will probably need to see her early next week, as tomorrow is full.

## 2012-11-30 NOTE — Telephone Encounter (Signed)
Left message to call back  

## 2012-11-30 NOTE — Telephone Encounter (Signed)
Spoke with patient she state she is not concerned about the diarrhea, it is the fever. She has been having a temperature of 99 and 98.9, would like to be seen in the office.

## 2012-12-01 ENCOUNTER — Encounter: Payer: Self-pay | Admitting: Internal Medicine

## 2012-12-01 ENCOUNTER — Ambulatory Visit (INDEPENDENT_AMBULATORY_CARE_PROVIDER_SITE_OTHER): Payer: Medicare Other | Admitting: Internal Medicine

## 2012-12-01 VITALS — BP 102/72 | HR 65 | Temp 98.1°F | Wt 171.0 lb

## 2012-12-01 DIAGNOSIS — B9789 Other viral agents as the cause of diseases classified elsewhere: Secondary | ICD-10-CM

## 2012-12-01 DIAGNOSIS — I10 Essential (primary) hypertension: Secondary | ICD-10-CM

## 2012-12-01 DIAGNOSIS — E119 Type 2 diabetes mellitus without complications: Secondary | ICD-10-CM

## 2012-12-01 DIAGNOSIS — B349 Viral infection, unspecified: Secondary | ICD-10-CM | POA: Insufficient documentation

## 2012-12-01 DIAGNOSIS — E039 Hypothyroidism, unspecified: Secondary | ICD-10-CM

## 2012-12-01 LAB — CBC WITH DIFFERENTIAL/PLATELET
Basophils Absolute: 0 10*3/uL (ref 0.0–0.1)
Basophils Relative: 0.1 % (ref 0.0–3.0)
Eosinophils Absolute: 0.1 10*3/uL (ref 0.0–0.7)
Hemoglobin: 14.1 g/dL (ref 12.0–15.0)
Lymphocytes Relative: 25.9 % (ref 12.0–46.0)
MCHC: 33.4 g/dL (ref 30.0–36.0)
MCV: 89 fl (ref 78.0–100.0)
Monocytes Absolute: 0.7 10*3/uL (ref 0.1–1.0)
Neutro Abs: 5.2 10*3/uL (ref 1.4–7.7)
RBC: 4.74 Mil/uL (ref 3.87–5.11)
RDW: 14.4 % (ref 11.5–14.6)

## 2012-12-01 LAB — COMPREHENSIVE METABOLIC PANEL
ALT: 32 U/L (ref 0–35)
AST: 20 U/L (ref 0–37)
Albumin: 4.1 g/dL (ref 3.5–5.2)
Alkaline Phosphatase: 59 U/L (ref 39–117)
BUN: 29 mg/dL — ABNORMAL HIGH (ref 6–23)
Calcium: 9.3 mg/dL (ref 8.4–10.5)
Chloride: 96 mEq/L (ref 96–112)
Creatinine, Ser: 0.9 mg/dL (ref 0.4–1.2)
Potassium: 4.3 mEq/L (ref 3.5–5.1)

## 2012-12-01 NOTE — Assessment & Plan Note (Signed)
Symptoms most consistent with viral syndrome. Symptoms have improved and pt is afebrile today. Rapid flu test in clinic negative. Labs normal. Will continue to monitor symptoms. Follow up prn.

## 2012-12-01 NOTE — Progress Notes (Signed)
Subjective:    Patient ID: [name], female    DOB: 1934-11-21, 78 y.o.   MRN: [id number]  HPI 78YO female with DM, HTN, hypothyroidism presents for acute visit. Earlier this week, she started Amoxicillin in preparation for dental procedure. After starting Amoxicillin, she developed diarrhea. She had 3 episodes of watery diarrhea. She then developed diffuse muscle aches, headache, and fever 42F. Over the last few days, symptoms have gradually improved. No fever today. Tolerating full diet. No NVD. Some mild persistent fatigue. Blood sugars have been well controlled. Fasting BG this morning 136.  Outpatient Encounter Prescriptions as of 12/01/2012  Medication Sig Dispense Refill  . aspirin 81 MG tablet Take 81 mg by mouth daily.      Marland Kitchen atenolol (TENORMIN) 50 MG tablet Take 1 tablet (50 mg total) by mouth daily.  90 tablet  3  . bimatoprost (LUMIGAN) 0.01 % SOLN Place 1 drop into both eyes at bedtime.      Marland Kitchen BIOTIN 5000 PO Take 1 tablet by mouth daily.      Marland Kitchen levothyroxine (SYNTHROID, LEVOTHROID) 100 MCG tablet Take 1 tablet (100 mcg total) by mouth daily.  30 tablet  5  . losartan-hydrochlorothiazide (HYZAAR) 50-12.5 MG per tablet Take 1 tablet by mouth daily.  90 tablet  4  . montelukast (SINGULAIR) 10 MG tablet Take 1 tablet (10 mg total) by mouth daily.  30 tablet  0  . Multiple Vitamins-Minerals (MULTIVITAMIN PO) Take 1 tablet by mouth daily.      . Timolol Maleate (ISTALOL) 0.5 % (DAILY) SOLN Apply 1 drop to eye daily.      . [DISCONTINUED] amoxicillin (AMOXIL) 500 MG tablet Take 4 tablets (2,000 mg total) by mouth once. 1 hr before procedure.  4 tablet  0   No facility-administered encounter medications on file as of 12/01/2012.   BP 102/72  Pulse 65  Temp(Src) 98.1 F (36.7 C) (Oral)  Wt 171 lb (77.565 kg)  BMI 30.53 kg/m2  SpO2 96%  Review of Systems  Constitutional: Positive for fever and fatigue. Negative for chills, appetite change and unexpected weight change.  HENT:  Negative for congestion, ear pain, sinus pressure, sore throat, trouble swallowing and voice change.   Eyes: Negative for visual disturbance.  Respiratory: Negative for cough, shortness of breath, wheezing and stridor.   Cardiovascular: Negative for chest pain, palpitations and leg swelling.  Gastrointestinal: Positive for diarrhea. Negative for nausea, vomiting, abdominal pain, constipation, blood in stool, abdominal distention and anal bleeding.  Genitourinary: Negative for dysuria and flank pain.  Musculoskeletal: Positive for myalgias. Negative for arthralgias, gait problem and neck pain.  Skin: Negative for color change and rash.  Neurological: Negative for dizziness and headaches.  Hematological: Negative for adenopathy. Does not bruise/bleed easily.  Psychiatric/Behavioral: Negative for suicidal ideas, sleep disturbance and dysphoric mood. The patient is not nervous/anxious.        Objective:   Physical Exam  Constitutional: She is oriented to person, place, and time. She appears well-developed and well-nourished. No distress.  HENT:  Head: Normocephalic and atraumatic.  Right Ear: External ear normal.  Left Ear: External ear normal.  Nose: Nose normal.  Mouth/Throat: Oropharynx is clear and moist. No oropharyngeal exudate.  Eyes: Conjunctivae are normal. Pupils are equal, round, and reactive to light. Right eye exhibits no discharge. Left eye exhibits no discharge. No scleral icterus.  Neck: Normal range of motion. Neck supple. No tracheal deviation present. No thyromegaly present.  Cardiovascular: Normal rate, regular rhythm, normal heart  sounds and intact distal pulses.  Exam reveals no gallop and no friction rub.   No murmur heard. Pulmonary/Chest: Effort normal and breath sounds normal. No accessory muscle usage. Not tachypneic. No respiratory distress. She has no decreased breath sounds. She has no wheezes. She has no rhonchi. She has no rales. She exhibits no tenderness.   Musculoskeletal: Normal range of motion. She exhibits no edema and no tenderness.  Lymphadenopathy:    She has no cervical adenopathy.  Neurological: She is alert and oriented to person, place, and time. No cranial nerve deficit. She exhibits normal muscle tone. Coordination normal.  Skin: Skin is warm and dry. No rash noted. She is not diaphoretic. No erythema. No pallor.  Psychiatric: She has a normal mood and affect. Her behavior is normal. Judgment and thought content normal.          Assessment & Plan:

## 2012-12-01 NOTE — Assessment & Plan Note (Signed)
BP Readings from Last 3 Encounters:  12/01/12 102/72  09/21/12 104/68  09/14/12 148/88   BP well controlled on current medications. Will continue.

## 2012-12-01 NOTE — Telephone Encounter (Signed)
Had a cancellation so patient scheduled to be seen today

## 2012-12-01 NOTE — Assessment & Plan Note (Signed)
TSH normal today. Continue Levothyroxine.

## 2012-12-01 NOTE — Assessment & Plan Note (Signed)
Lab Results  Component Value Date   HGBA1C 6.8* 12/01/2012   Labs show stable blood sugar control today with diet alone. Will continue to monitor.

## 2012-12-11 ENCOUNTER — Other Ambulatory Visit: Payer: Self-pay | Admitting: Internal Medicine

## 2012-12-15 ENCOUNTER — Other Ambulatory Visit (HOSPITAL_COMMUNITY)
Admission: RE | Admit: 2012-12-15 | Discharge: 2012-12-15 | Disposition: A | Discharge status: Home / Self Care | Payer: Medicare Other | Source: Ambulatory Visit | Attending: Internal Medicine | Admitting: Internal Medicine

## 2012-12-15 ENCOUNTER — Ambulatory Visit (INDEPENDENT_AMBULATORY_CARE_PROVIDER_SITE_OTHER): Payer: Medicare Other | Admitting: Internal Medicine

## 2012-12-15 ENCOUNTER — Encounter: Payer: Self-pay | Admitting: Internal Medicine

## 2012-12-15 ENCOUNTER — Ambulatory Visit: Payer: Medicare Other

## 2012-12-15 VITALS — BP 94/62 | HR 62 | Temp 97.7°F | Wt 173.0 lb

## 2012-12-15 DIAGNOSIS — Z23 Encounter for immunization: Secondary | ICD-10-CM

## 2012-12-15 DIAGNOSIS — I10 Essential (primary) hypertension: Secondary | ICD-10-CM

## 2012-12-15 DIAGNOSIS — Z124 Encounter for screening for malignant neoplasm of cervix: Secondary | ICD-10-CM

## 2012-12-15 DIAGNOSIS — Z1239 Encounter for other screening for malignant neoplasm of breast: Secondary | ICD-10-CM

## 2012-12-15 DIAGNOSIS — Z1151 Encounter for screening for human papillomavirus (HPV): Secondary | ICD-10-CM | POA: Insufficient documentation

## 2012-12-15 DIAGNOSIS — E119 Type 2 diabetes mellitus without complications: Secondary | ICD-10-CM

## 2012-12-15 DIAGNOSIS — Z01419 Encounter for gynecological examination (general) (routine) without abnormal findings: Secondary | ICD-10-CM | POA: Insufficient documentation

## 2012-12-15 LAB — HM PAP SMEAR: HM Pap smear: NORMAL

## 2012-12-15 MED ORDER — LOSARTAN POTASSIUM 50 MG PO TABS: 50.0000 mg | ORAL_TABLET | Freq: Every day | ORAL | Status: DC

## 2012-12-15 NOTE — Assessment & Plan Note (Signed)
BP Readings from Last 3 Encounters:  12/15/12 94/62  12/01/12 102/72  09/21/12 104/68   Blood pressure recently running low and labs are suggestive of dehydration. Will change to losartan hydrochlorothiazide to losartan alone. Encouraged increased fluid intake. Followup in 4 weeks to recheck blood pressure.

## 2012-12-15 NOTE — Progress Notes (Signed)
Subjective:    Patient ID: [name], female    DOB: May 10, 1934, 78 y.o.   MRN: [id number]  HPI 78 year old female with history of diabetes, hypertension, hypothyroidism presents for followup. She reports she is generally been feeling well. Blood sugars have been well-controlled. She did not bring record of blood sugars with her today. She has had occasional episodes recently of lightheadedness. No episodes of syncope. She reports frequent thirst. No polyuria.   She is concerned about her Pap smear. She reports her last Pap smear was in 2011. She notes a family history of either ovarian or uterine cancer. She would like to repeat her Pap smear today if possible. No recent vaginal bleeding, vaginal discharge, or other concerns.  Outpatient Encounter Prescriptions as of 12/15/2012  Medication Sig  . aspirin 81 MG tablet Take 81 mg by mouth daily.  Marland Kitchen atenolol (TENORMIN) 50 MG tablet Take 1 tablet by mouth  daily  . BIOTIN 5000 PO Take 1 tablet by mouth daily.  Marland Kitchen latanoprost (XALATAN) 0.005 % ophthalmic solution   . levothyroxine (SYNTHROID, LEVOTHROID) 100 MCG tablet Take 1 tablet (100 mcg total) by mouth daily.  . montelukast (SINGULAIR) 10 MG tablet Take 1 tablet (10 mg total) by mouth daily.  . [DISCONTINUED] losartan-hydrochlorothiazide (HYZAAR) 50-12.5 MG per tablet Take 1 tablet by mouth  daily  . bimatoprost (LUMIGAN) 0.01 % SOLN Place 1 drop into both eyes at bedtime.  Marland Kitchen losartan (COZAAR) 50 MG tablet Take 1 tablet (50 mg total) by mouth daily.  . Multiple Vitamins-Minerals (MULTIVITAMIN PO) Take 1 tablet by mouth daily.  . Timolol Maleate (ISTALOL) 0.5 % (DAILY) SOLN Apply 1 drop to eye daily.   BP 94/62  Pulse 62  Temp(Src) 97.7 F (36.5 C) (Oral)  Wt 173 lb (78.472 kg)  BMI 30.88 kg/m2  SpO2 99%  Review of Systems  Constitutional: Negative for fever, chills, appetite change, fatigue and unexpected weight change.  HENT: Negative for congestion, ear pain, sinus pressure,  sore throat, trouble swallowing and voice change.   Eyes: Negative for visual disturbance.  Respiratory: Negative for cough, shortness of breath, wheezing and stridor.   Cardiovascular: Negative for chest pain, palpitations and leg swelling.  Gastrointestinal: Negative for nausea, vomiting, abdominal pain, diarrhea, constipation, blood in stool, abdominal distention and anal bleeding.  Genitourinary: Negative for dysuria and flank pain.  Musculoskeletal: Negative for arthralgias, gait problem, myalgias and neck pain.  Skin: Negative for color change and rash.  Neurological: Negative for dizziness and headaches.  Hematological: Negative for adenopathy. Does not bruise/bleed easily.  Psychiatric/Behavioral: Negative for suicidal ideas, sleep disturbance and dysphoric mood. The patient is not nervous/anxious.        Objective:   Physical Exam  Constitutional: She is oriented to person, place, and time. She appears well-developed and well-nourished. No distress.  HENT:  Head: Normocephalic and atraumatic.  Right Ear: External ear normal.  Left Ear: External ear normal.  Nose: Nose normal.  Mouth/Throat: Oropharynx is clear and moist. No oropharyngeal exudate.  Eyes: Conjunctivae are normal. Pupils are equal, round, and reactive to light. Right eye exhibits no discharge. Left eye exhibits no discharge. No scleral icterus.  Neck: Normal range of motion. Neck supple. No tracheal deviation present. No thyromegaly present.  Cardiovascular: Normal rate, regular rhythm, normal heart sounds and intact distal pulses.  Exam reveals no gallop and no friction rub.   No murmur heard. Pulmonary/Chest: Effort normal and breath sounds normal. No accessory muscle usage. Not tachypneic. No respiratory distress.  She has no decreased breath sounds. She has no wheezes. She has no rhonchi. She has no rales. She exhibits no tenderness.  Genitourinary: Uterus normal. There is no rash, tenderness, lesion or injury on  the right labia. There is no rash, tenderness, lesion or injury on the left labia. Cervix exhibits friability. Cervix exhibits no motion tenderness and no discharge. There is erythema around the vagina. No tenderness around the vagina. No signs of injury around the vagina. No vaginal discharge found.  Musculoskeletal: Normal range of motion. She exhibits no edema and no tenderness.  Lymphadenopathy:    She has no cervical adenopathy.  Neurological: She is alert and oriented to person, place, and time. No cranial nerve deficit. She exhibits normal muscle tone. Coordination normal.  Skin: Skin is warm and dry. No rash noted. She is not diaphoretic. No erythema. No pallor.  Psychiatric: She has a normal mood and affect. Her behavior is normal. Judgment and thought content normal.          Assessment & Plan:

## 2012-12-15 NOTE — Assessment & Plan Note (Signed)
Pelvic exam normal today. PAP pending. 

## 2012-12-15 NOTE — Assessment & Plan Note (Signed)
Lab Results  Component Value Date   HGBA1C 6.8* 12/01/2012   Blood sugars have been very well-controlled with diet alone. Will continue to monitor.

## 2012-12-15 NOTE — Patient Instructions (Signed)
We will change Losartan-HCTZ to Losartan. I have called in this prescription for you.  Please let us know if any questions.

## 2012-12-28 ENCOUNTER — Ambulatory Visit: Payer: Self-pay | Admitting: Internal Medicine

## 2013-01-15 ENCOUNTER — Encounter: Payer: Self-pay | Admitting: Internal Medicine

## 2013-01-15 ENCOUNTER — Ambulatory Visit (INDEPENDENT_AMBULATORY_CARE_PROVIDER_SITE_OTHER): Payer: Medicare Other | Admitting: Internal Medicine

## 2013-01-15 VITALS — BP 126/74 | HR 56 | Temp 97.6°F | Wt 176.0 lb

## 2013-01-15 DIAGNOSIS — E119 Type 2 diabetes mellitus without complications: Secondary | ICD-10-CM

## 2013-01-15 DIAGNOSIS — Z8619 Personal history of other infectious and parasitic diseases: Secondary | ICD-10-CM | POA: Insufficient documentation

## 2013-01-15 DIAGNOSIS — I10 Essential (primary) hypertension: Secondary | ICD-10-CM

## 2013-01-15 DIAGNOSIS — A048 Other specified bacterial intestinal infections: Secondary | ICD-10-CM

## 2013-01-15 NOTE — Progress Notes (Signed)
Pre-visit discussion using our clinic review tool. No additional management support is needed unless otherwise documented below in the visit note.  

## 2013-01-15 NOTE — Assessment & Plan Note (Signed)
Diet-controlled. Plan to repeat A1c in 2 months. Encouraged continued healthy diet and regular exercise.

## 2013-01-15 NOTE — Assessment & Plan Note (Signed)
BP Readings from Last 3 Encounters:  01/15/13 126/74  12/15/12 94/62  12/01/12 102/72   BP well controlled despite stopping HCTZ. Will continue Losartan and Atenolol.

## 2013-01-15 NOTE — Assessment & Plan Note (Signed)
Symptomatically doing well. Will perform H. Pylori breath test today to ensure no recurrent infection.

## 2013-01-15 NOTE — Progress Notes (Signed)
Subjective:    Patient ID: [name], female    DOB: February 26, 1934, 78 y.o.   MRN: [id number]  HPI 78 year old female with history of diet controlled diabetes, hypertension, hypothyroidism, and recent H. pylori infection presents for followup after change in BP medication from losartan-HCTZ to losartan. She reports that she is generally feeling well. She notes that when she initially stopped hydrochlorothiazide, she had some elevated blood pressure readings as high as 160/100. However, over the last couple of weeks blood pressure has been well-controlled less than 130/80. She denies any side effects from the medication. She is compliant with her medication.  She was also recently treated for H. pylori infection. Symptomatically, she is doing well however she occasionally has episodes of epigastric discomfort or bloating. She did not have a test of cure.  Outpatient Encounter Prescriptions as of 01/15/2013  Medication Sig  . Ascorbic Acid (VITAMIN C PO) Take by mouth.  Marland Kitchen aspirin 81 MG tablet Take 81 mg by mouth daily.  Marland Kitchen atenolol (TENORMIN) 50 MG tablet Take 1 tablet by mouth  daily  . BIOTIN 5000 PO Take 1 tablet by mouth daily.  Marland Kitchen latanoprost (XALATAN) 0.005 % ophthalmic solution   . levothyroxine (SYNTHROID, LEVOTHROID) 100 MCG tablet Take 1 tablet (100 mcg total) by mouth daily.  Marland Kitchen losartan (COZAAR) 50 MG tablet Take 1 tablet (50 mg total) by mouth daily.  . montelukast (SINGULAIR) 10 MG tablet Take 1 tablet (10 mg total) by mouth daily.   BP 126/74  Pulse 56  Temp(Src) 97.6 F (36.4 C) (Oral)  Wt 176 lb (79.833 kg)  SpO2 97%  Review of Systems  Constitutional: Negative for fever, chills, appetite change, fatigue and unexpected weight change.  HENT: Negative for congestion, ear pain, sinus pressure, sore throat, trouble swallowing and voice change.   Eyes: Negative for visual disturbance.  Respiratory: Negative for cough, shortness of breath, wheezing and stridor.     Cardiovascular: Negative for chest pain, palpitations and leg swelling.  Gastrointestinal: Positive for abdominal distention. Negative for nausea, vomiting, abdominal pain, diarrhea, constipation, blood in stool and anal bleeding.  Genitourinary: Negative for dysuria and flank pain.  Musculoskeletal: Negative for arthralgias, gait problem, myalgias and neck pain.  Skin: Negative for color change and rash.  Neurological: Negative for dizziness and headaches.  Hematological: Negative for adenopathy. Does not bruise/bleed easily.  Psychiatric/Behavioral: Negative for suicidal ideas, sleep disturbance and dysphoric mood. The patient is not nervous/anxious.        Objective:   Physical Exam  Constitutional: She is oriented to person, place, and time. She appears well-developed and well-nourished. No distress.  HENT:  Head: Normocephalic and atraumatic.  Right Ear: External ear normal.  Left Ear: External ear normal.  Nose: Nose normal.  Mouth/Throat: Oropharynx is clear and moist. No oropharyngeal exudate.  Eyes: Conjunctivae are normal. Pupils are equal, round, and reactive to light. Right eye exhibits no discharge. Left eye exhibits no discharge. No scleral icterus.  Neck: Normal range of motion. Neck supple. No tracheal deviation present. No thyromegaly present.  Cardiovascular: Normal rate, regular rhythm, normal heart sounds and intact distal pulses.  Exam reveals no gallop and no friction rub.   No murmur heard. Pulmonary/Chest: Effort normal and breath sounds normal. No accessory muscle usage. Not tachypneic. No respiratory distress. She has no decreased breath sounds. She has no wheezes. She has no rhonchi. She has no rales. She exhibits no tenderness.  Musculoskeletal: Normal range of motion. She exhibits no edema and no  tenderness.  Lymphadenopathy:    She has no cervical adenopathy.  Neurological: She is alert and oriented to person, place, and time. No cranial nerve deficit. She  exhibits normal muscle tone. Coordination normal.  Skin: Skin is warm and dry. No rash noted. She is not diaphoretic. No erythema. No pallor.  Psychiatric: She has a normal mood and affect. Her behavior is normal. Judgment and thought content normal.          Assessment & Plan:

## 2013-01-25 LAB — HM MAMMOGRAPHY: HM Mammogram: NORMAL

## 2013-02-23 ENCOUNTER — Other Ambulatory Visit: Payer: Self-pay | Admitting: Internal Medicine

## 2013-03-22 NOTE — Progress Notes (Signed)
Subjective:    Patient ID: [name], female    DOB: 1935/01/02, 79 y.o.   MRN: [id number]  HPI 79YO female with DM, HTN presents for follow up.  DM - BG at home running 120s-150s. She is compliant with healthy diet.  She notes some increased clear nasal drainage and occasional cough productive of clear sputum. She is taking Singulair with some improvement. She is reluctant to use nasal steroids because of h/o epistaxis.   Review of Systems  Constitutional: Negative for fever, chills, appetite change, fatigue and unexpected weight change.  HENT: Negative for congestion, ear pain, sinus pressure, sore throat, trouble swallowing and voice change.   Eyes: Negative for visual disturbance.  Respiratory: Positive for cough (occasionally productive). Negative for shortness of breath, wheezing and stridor.   Cardiovascular: Negative for chest pain, palpitations and leg swelling.  Gastrointestinal: Negative for nausea, vomiting, abdominal pain, diarrhea, constipation, blood in stool, abdominal distention and anal bleeding.  Genitourinary: Negative for dysuria and flank pain.  Musculoskeletal: Negative for arthralgias, gait problem, myalgias and neck pain.  Skin: Negative for color change and rash.  Neurological: Negative for dizziness and headaches.  Hematological: Negative for adenopathy. Does not bruise/bleed easily.  Psychiatric/Behavioral: Negative for suicidal ideas, sleep disturbance and dysphoric mood. The patient is not nervous/anxious.        Objective:    BP 118/80  Pulse 55  Temp(Src) 97.5 F (36.4 C) (Oral)  Wt 171 lb (77.565 kg)  SpO2 98% Physical Exam  Constitutional: She is oriented to person, place, and time. She appears well-developed and well-nourished. No distress.  HENT:  Head: Normocephalic and atraumatic.  Right Ear: External ear normal.  Left Ear: External ear normal.  Nose: Rhinorrhea present.  Mouth/Throat: Oropharynx is clear and moist. No  oropharyngeal exudate.  Eyes: Conjunctivae are normal. Pupils are equal, round, and reactive to light. Right eye exhibits no discharge. Left eye exhibits no discharge. No scleral icterus.  Neck: Normal range of motion. Neck supple. No tracheal deviation present. No thyromegaly present.  Cardiovascular: Normal rate, regular rhythm, normal heart sounds and intact distal pulses.  Exam reveals no gallop and no friction rub.   No murmur heard. Pulmonary/Chest: Effort normal and breath sounds normal. No accessory muscle usage. Not tachypneic. No respiratory distress. She has no decreased breath sounds. She has no wheezes. She has no rhonchi. She has no rales. She exhibits no tenderness.  Musculoskeletal: Normal range of motion. She exhibits no edema and no tenderness.  Lymphadenopathy:    She has no cervical adenopathy.  Neurological: She is alert and oriented to person, place, and time. No cranial nerve deficit. She exhibits normal muscle tone. Coordination normal.  Skin: Skin is warm and dry. No rash noted. She is not diaphoretic. No erythema. No pallor.  Psychiatric: She has a normal mood and affect. Her behavior is normal. Judgment and thought content normal.          Assessment & Plan:   Problem List Items Addressed This Visit   Allergic rhinitis     Symptoms not well controlled with Singulair alone. Encouraged use of Claritin or Zyrtec during the day and Benadryl at night. She does not want to try nasal steroid because of risk of epistaxis. Discussed possible referral to allergist if symptoms persistent.    Diabetes mellitus type 2, controlled - Primary     Diet controlled. Will check A1c with labs. Encouraged continued healthy diet and exercise. Foot and eye exam UTD.  Relevant Orders      Comprehensive metabolic panel      Hemoglobin A1c      Lipid panel      Microalbumin / creatinine urine ratio   Hypertension      BP Readings from Last 3 Encounters:  03/23/13 118/80    01/15/13 126/74  12/15/12 94/62   BP well controlled on Losartan and Atenolol. Will continue. Will check renal function with labs.    Hypothyroidism     Symptomatically doing well. Will check TSH with labs today. Continue Levothyroxine.    Relevant Orders      TSH       Return in about 6 months (around 09/20/2013) for Wellness Visit.

## 2013-03-23 ENCOUNTER — Ambulatory Visit (INDEPENDENT_AMBULATORY_CARE_PROVIDER_SITE_OTHER): Payer: Medicare Other | Admitting: Internal Medicine

## 2013-03-23 ENCOUNTER — Encounter: Payer: Self-pay | Admitting: Internal Medicine

## 2013-03-23 VITALS — BP 118/80 | HR 55 | Temp 97.5°F | Wt 171.0 lb

## 2013-03-23 DIAGNOSIS — Z23 Encounter for immunization: Secondary | ICD-10-CM

## 2013-03-23 DIAGNOSIS — E119 Type 2 diabetes mellitus without complications: Secondary | ICD-10-CM

## 2013-03-23 DIAGNOSIS — E039 Hypothyroidism, unspecified: Secondary | ICD-10-CM

## 2013-03-23 DIAGNOSIS — J309 Allergic rhinitis, unspecified: Secondary | ICD-10-CM

## 2013-03-23 DIAGNOSIS — I10 Essential (primary) hypertension: Secondary | ICD-10-CM

## 2013-03-23 LAB — TSH: TSH: 3.43 u[IU]/mL (ref 0.35–5.50)

## 2013-03-23 LAB — HEMOGLOBIN A1C: Hgb A1c MFr Bld: 6.5 % (ref 4.6–6.5)

## 2013-03-23 NOTE — Assessment & Plan Note (Signed)
Diet controlled. Will check A1c with labs. Encouraged continued healthy diet and exercise. Foot and eye exam UTD.

## 2013-03-23 NOTE — Assessment & Plan Note (Signed)
Symptomatically doing well. Will check TSH with labs today. Continue Levothyroxine.

## 2013-03-23 NOTE — Progress Notes (Signed)
Pre-visit discussion using our clinic review tool. No additional management support is needed unless otherwise documented below in the visit note.  

## 2013-03-23 NOTE — Assessment & Plan Note (Signed)
Symptoms not well controlled with Singulair alone. Encouraged use of Claritin or Zyrtec during the day and Benadryl at night. She does not want to try nasal steroid because of risk of epistaxis. Discussed possible referral to allergist if symptoms persistent.

## 2013-03-23 NOTE — Addendum Note (Signed)
Addended by: Ronaldo Miyamoto on: 03/23/2013 09:55 AM   Modules accepted: Orders

## 2013-03-23 NOTE — Assessment & Plan Note (Signed)
BP Readings from Last 3 Encounters:  03/23/13 118/80  01/15/13 126/74  12/15/12 94/62   BP well controlled on Losartan and Atenolol. Will continue. Will check renal function with labs.

## 2013-03-26 ENCOUNTER — Telehealth: Payer: Self-pay | Admitting: Internal Medicine

## 2013-03-26 LAB — COMPREHENSIVE METABOLIC PANEL
ALBUMIN: 4.4 g/dL (ref 3.5–5.2)
ALK PHOS: 70 U/L (ref 39–117)
ALT: 39 U/L — AB (ref 0–35)
AST: 28 U/L (ref 0–37)
BUN: 18 mg/dL (ref 6–23)
CO2: 28 mEq/L (ref 19–32)
Calcium: 9.2 mg/dL (ref 8.4–10.5)
Chloride: 105 mEq/L (ref 96–112)
Creatinine, Ser: 0.9 mg/dL (ref 0.4–1.2)
GFR: 65.51 mL/min (ref >60.00)
Glucose, Bld: 117 mg/dL — ABNORMAL HIGH (ref 70–99)
POTASSIUM: 4.2 meq/L (ref 3.5–5.1)
SODIUM: 139 meq/L (ref 135–145)
TOTAL PROTEIN: 7.3 g/dL (ref 6.0–8.3)
Total Bilirubin: 0.7 mg/dL (ref 0.3–1.2)

## 2013-03-26 LAB — LIPID PANEL
CHOL/HDL RATIO: 3
Cholesterol: 133 mg/dL (ref 0–200)
HDL: 45.8 mg/dL (ref >39.00)
LDL CALC: 76 mg/dL (ref 0–99)
TRIGLYCERIDES: 57 mg/dL (ref 0.0–149.0)
VLDL: 11.4 mg/dL (ref 0.0–40.0)

## 2013-03-26 LAB — MICROALBUMIN / CREATININE URINE RATIO
Creatinine,U: 172.8 mg/dL
MICROALB/CREAT RATIO: 0.9 mg/g (ref 0.0–30.0)
Microalb, Ur: 1.5 mg/dL (ref 0.0–1.9)

## 2013-03-26 NOTE — Telephone Encounter (Signed)
Relevant patient education mailed to patient.  

## 2013-04-03 ENCOUNTER — Encounter: Payer: Self-pay | Admitting: Internal Medicine

## 2013-04-09 ENCOUNTER — Other Ambulatory Visit: Payer: Self-pay | Admitting: Internal Medicine

## 2013-04-16 ENCOUNTER — Telehealth: Payer: Self-pay | Admitting: Internal Medicine

## 2013-04-16 NOTE — Telephone Encounter (Signed)
Patient and spouse came by the office today to find out what was going on with diabetic supplies that [facility] had faxed our office. The patient states they have called [facility] on 03/13/2013, 04/11/13 when he called our office and spoke with Arbie Cookey and 04/16/13 all in which they have been told that the company has faxed request to our office for her diabetic supplies. Their phone number is [phone] and the fax is [phone]. Patient is in need of a new meter, control solution, test strips, lancets, and lantin device the part device you put the test strip on as explained by the pharmacy. I called the pharmacy today since I show no documentation that a request was received they repeated our fax number to me but I have been waiting on the fax since 5:12 and it hasn't came through yet. [facility] did say that we could send the request on a prescription and they would go ahead and process this order. This is the first time patient has gotten diabetic supplies from this company due to insurance change. Please advise when this has been taken care of.

## 2013-04-17 MED ORDER — ACCU-CHEK SMARTVIEW CONTROL VI LIQD: Status: DC

## 2013-04-17 MED ORDER — GLUCOSE BLOOD VI STRP: ORAL_STRIP | Status: DC

## 2013-04-17 MED ORDER — ACCU-CHEK NANO SMARTVIEW W/DEVICE KIT: PACK | Status: DC

## 2013-04-17 MED ORDER — ACCU-CHEK SOFT TOUCH LANCETS MISC
Status: DC
Start: 2013-04-17 — End: 2013-07-03

## 2013-04-17 MED ORDER — ACCU-CHEK SOFTCLIX LANCET DEV KIT: PACK | Status: DC

## 2013-04-17 NOTE — Telephone Encounter (Signed)
Prescriptions printed, signed and faxed to pharmacy per patient request. Faxed to [phone]

## 2013-04-18 NOTE — Telephone Encounter (Signed)
Never received a request from this company on this patient. Called and spoke with a customer service representative Rej. The fax number given to me to fax prescriptions to is [phone]. Information has been re-faxed to number provided.

## 2013-04-18 NOTE — Telephone Encounter (Signed)
Received paper today, fax did not go through.

## 2013-04-20 NOTE — Telephone Encounter (Signed)
I called to let husband know that this prescription had been faxed in. I also let him know that we never received a fax from them for the request. He said something must be wrong with our fax machine since this company is telling him that they had faxed it to [place] several times.  I told him that when I called them they also verified our fax number however I never received anything from them either after waiting over an hour. He is going to call them to make sure they did receive our fax.

## 2013-04-23 NOTE — Telephone Encounter (Signed)
Received a fax for clarification on patient from [facility]. It was completed and faxed

## 2013-06-25 LAB — HM DIABETES EYE EXAM

## 2013-06-28 ENCOUNTER — Telehealth: Payer: Self-pay | Admitting: Internal Medicine

## 2013-06-28 NOTE — Telephone Encounter (Signed)
Offered appts with Raquel tomorrow, pt declined. Only wants to see Dr. Gilford Rile. Denies any redness or swelling or warmth of right leg. Appt scheduled 07/03/13. FYI

## 2013-06-28 NOTE — Telephone Encounter (Signed)
Patient Information:  Caller Name: Richelle  Phone: [phone]  Patient: Keishia, Ground  Gender: Female  DOB: 1934-10-03  Age: 79 Years  PCP: Ronette Deter (Adults only)  Office Follow Up:  Does the office need to follow up with this patient?: Yes  Instructions For The Office: Requesting to be seen today  RN Note:  Patient is calling regarding Right sided back pain that runs down into her leg.  States "I've had trouble with back pain for a long time, but about a week ago it started running down my right leg."   Requesting to be seen today.  Symptoms  Reason For Call & Symptoms: right sided back pain  Reviewed Health History In EMR: Yes  Reviewed Medications In EMR: Yes  Reviewed Allergies In EMR: Yes  Reviewed Surgeries / Procedures: Yes  Date of Onset of Symptoms: 06/21/2013  Guideline(s) Used:  Back Pain  Disposition Per Guideline:   See Today or Tomorrow in Office  Reason For Disposition Reached:   Pain radiates into the thigh or further down the leg  Advice Given:  Call Back If:  You become worse.  Patient Will Follow Care Advice:  YES

## 2013-07-03 ENCOUNTER — Ambulatory Visit (INDEPENDENT_AMBULATORY_CARE_PROVIDER_SITE_OTHER): Payer: Medicare Other | Admitting: Internal Medicine

## 2013-07-03 ENCOUNTER — Ambulatory Visit (INDEPENDENT_AMBULATORY_CARE_PROVIDER_SITE_OTHER)
Admission: RE | Admit: 2013-07-03 | Discharge: 2013-07-03 | Disposition: A | Discharge status: Home / Self Care | Payer: Medicare Other | Source: Ambulatory Visit | Attending: Internal Medicine | Admitting: Internal Medicine

## 2013-07-03 ENCOUNTER — Encounter: Payer: Self-pay | Admitting: Internal Medicine

## 2013-07-03 VITALS — BP 130/80 | HR 64 | Temp 97.8°F | Ht 62.75 in | Wt 174.5 lb

## 2013-07-03 DIAGNOSIS — M5430 Sciatica, unspecified side: Secondary | ICD-10-CM

## 2013-07-03 DIAGNOSIS — M5431 Sciatica, right side: Secondary | ICD-10-CM

## 2013-07-03 HISTORY — DX: Sciatica, right side: M54.31

## 2013-07-03 MED ORDER — GLUCOSE BLOOD VI STRP: ORAL_STRIP | Status: DC

## 2013-07-03 MED ORDER — ACCU-CHEK SOFT TOUCH LANCETS MISC: Status: DC

## 2013-07-03 MED ORDER — PREDNISONE 10 MG PO TABS: ORAL_TABLET | ORAL | Status: DC

## 2013-07-03 NOTE — Progress Notes (Signed)
Pre visit review using our clinic review tool, if applicable. No additional management support is needed unless otherwise documented below in the visit note. 

## 2013-07-03 NOTE — Assessment & Plan Note (Signed)
Symptoms and exam most consistent with right sided sciatica. Discussed options including oral prednisone, referral to sports medicine for possible cortisone injection, and physical therapy. Pt declines PT. Will start oral prednisone taper. Will get plain xray of lumbar spine. Follow up 2 weeks and prn.

## 2013-07-03 NOTE — Progress Notes (Signed)
   Subjective:    Patient ID: [name], female    DOB: 1934/03/20, 79 y.o.   MRN: [id number]  HPI 79YO female presents for acute visit. Low right sided back pain off and on for years. Typically mild, however became much worse last week. Described as sharp, lower right back, radiates down back of right leg and around to groin. No weakness, numbness. No new activity. No injury. Taking aspirin with minimal improvement. Using topical Vicks rub with some improvement.  Review of Systems  Constitutional: Negative for fever, chills and fatigue.  Respiratory: Negative for shortness of breath.   Cardiovascular: Negative for chest pain and leg swelling.  Musculoskeletal: Positive for arthralgias, back pain and myalgias.  Neurological: Negative for weakness and numbness.       Objective:    BP 130/80  Pulse 64  Temp(Src) 97.8 F (36.6 C) (Oral)  Ht 5' 2.75" (1.594 m)  Wt 174 lb 8 oz (79.153 kg)  BMI 31.15 kg/m2  SpO2 97% Physical Exam  Constitutional: She is oriented to person, place, and time. She appears well-developed and well-nourished. No distress.  HENT:  Head: Normocephalic and atraumatic.  Right Ear: External ear normal.  Left Ear: External ear normal.  Nose: Nose normal.  Mouth/Throat: Oropharynx is clear and moist. No oropharyngeal exudate.  Eyes: Conjunctivae are normal. Pupils are equal, round, and reactive to light. Right eye exhibits no discharge. Left eye exhibits no discharge. No scleral icterus.  Neck: Normal range of motion. Neck supple. No tracheal deviation present. No thyromegaly present.  Cardiovascular: Normal rate, regular rhythm, normal heart sounds and intact distal pulses.  Exam reveals no gallop and no friction rub.   No murmur heard. Pulmonary/Chest: Effort normal and breath sounds normal. No accessory muscle usage. Not tachypneic. No respiratory distress. She has no decreased breath sounds. She has no wheezes. She has no rhonchi. She has no rales. She  exhibits no tenderness.  Musculoskeletal: Normal range of motion. She exhibits no edema and no tenderness.       Lumbar back: She exhibits pain. She exhibits normal range of motion, no tenderness and no bony tenderness.       Back:  Lymphadenopathy:    She has no cervical adenopathy.  Neurological: She is alert and oriented to person, place, and time. No cranial nerve deficit. She exhibits normal muscle tone. Coordination normal.  Skin: Skin is warm and dry. No rash noted. She is not diaphoretic. No erythema. No pallor.  Psychiatric: She has a normal mood and affect. Her behavior is normal. Judgment and thought content normal.          Assessment & Plan:   Problem List Items Addressed This Visit   Right sided sciatica - Primary     Symptoms and exam most consistent with right sided sciatica. Discussed options including oral prednisone, referral to sports medicine for possible cortisone injection, and physical therapy. Pt declines PT. Will start oral prednisone taper. Will get plain xray of lumbar spine. Follow up 2 weeks and prn.    Relevant Medications      predniSONE (DELTASONE) tablet   Other Relevant Orders      DG Lumbar Spine Complete       Return in about 2 weeks (around 07/17/2013) for Recheck.

## 2013-07-03 NOTE — Patient Instructions (Signed)
Sciatica °Sciatica is pain, weakness, numbness, or tingling along the path of the sciatic nerve. The nerve starts in the lower back and runs down the back of each leg. The nerve controls the muscles in the lower leg and in the back of the knee, while also providing sensation to the back of the thigh, lower leg, and the sole of your foot. Sciatica is a symptom of another medical condition. For instance, nerve damage or certain conditions, such as a herniated disk or bone spur on the spine, pinch or put pressure on the sciatic nerve. This causes the pain, weakness, or other sensations normally associated with sciatica. Generally, sciatica only affects one side of the body. °CAUSES  °· Herniated or slipped disc. °· Degenerative disk disease. °· A pain disorder involving the narrow muscle in the buttocks (piriformis syndrome). °· Pelvic injury or fracture. °· Pregnancy. °· Tumor (rare). °SYMPTOMS  °Symptoms can vary from mild to very severe. The symptoms usually travel from the low back to the buttocks and down the back of the leg. Symptoms can include: °· Mild tingling or dull aches in the lower back, leg, or hip. °· Numbness in the back of the calf or sole of the foot. °· Burning sensations in the lower back, leg, or hip. °· Sharp pains in the lower back, leg, or hip. °· Leg weakness. °· Severe back pain inhibiting movement. °These symptoms may get worse with coughing, sneezing, laughing, or prolonged sitting or standing. Also, being overweight may worsen symptoms. °DIAGNOSIS  °Your caregiver will perform a physical exam to look for common symptoms of sciatica. He or she may ask you to do certain movements or activities that would trigger sciatic nerve pain. Other tests may be performed to find the cause of the sciatica. These may include: °· Blood tests. °· X-rays. °· Imaging tests, such as an MRI or CT scan. °TREATMENT  °Treatment is directed at the cause of the sciatic pain. Sometimes, treatment is not necessary  and the pain and discomfort goes away on its own. If treatment is needed, your caregiver may suggest: °· Over-the-counter medicines to relieve pain. °· Prescription medicines, such as anti-inflammatory medicine, muscle relaxants, or narcotics. °· Applying heat or ice to the painful area. °· Steroid injections to lessen pain, irritation, and inflammation around the nerve. °· Reducing activity during periods of pain. °· Exercising and stretching to strengthen your abdomen and improve flexibility of your spine. Your caregiver may suggest losing weight if the extra weight makes the back pain worse. °· Physical therapy. °· Surgery to eliminate what is pressing or pinching the nerve, such as a bone spur or part of a herniated disk. °HOME CARE INSTRUCTIONS  °· Only take over-the-counter or prescription medicines for pain or discomfort as directed by your caregiver. °· Apply ice to the affected area for 20 minutes, 3 4 times a day for the first 48 72 hours. Then try heat in the same way. °· Exercise, stretch, or perform your usual activities if these do not aggravate your pain. °· Attend physical therapy sessions as directed by your caregiver. °· Keep all follow-up appointments as directed by your caregiver. °· Do not wear high heels or shoes that do not provide proper support. °· Check your mattress to see if it is too soft. A firm mattress may lessen your pain and discomfort. °SEEK IMMEDIATE MEDICAL CARE IF:  °· You lose control of your bowel or bladder (incontinence). °· You have increasing weakness in the lower back,   pelvis, buttocks, or legs. °· You have redness or swelling of your back. °· You have a burning sensation when you urinate. °· You have pain that gets worse when you lie down or awakens you at night. °· Your pain is worse than you have experienced in the past. °· Your pain is lasting longer than 4 weeks. °· You are suddenly losing weight without reason. °MAKE SURE YOU: °· Understand these  instructions. °· Will watch your condition. °· Will get help right away if you are not doing well or get worse. °Document Released: 01/26/2001 Document Revised: 08/03/2011 Document Reviewed: 06/13/2011 °[employer]® Patient Information ©2014 [employer]. ° °

## 2013-08-02 ENCOUNTER — Encounter: Payer: Self-pay | Admitting: Internal Medicine

## 2013-08-02 ENCOUNTER — Ambulatory Visit (INDEPENDENT_AMBULATORY_CARE_PROVIDER_SITE_OTHER): Payer: Medicare Other | Admitting: Internal Medicine

## 2013-08-02 VITALS — BP 114/64 | HR 66 | Temp 97.7°F | Ht 62.75 in | Wt 174.5 lb

## 2013-08-02 DIAGNOSIS — I10 Essential (primary) hypertension: Secondary | ICD-10-CM

## 2013-08-02 DIAGNOSIS — M5431 Sciatica, right side: Secondary | ICD-10-CM

## 2013-08-02 DIAGNOSIS — E119 Type 2 diabetes mellitus without complications: Secondary | ICD-10-CM

## 2013-08-02 DIAGNOSIS — M5430 Sciatica, unspecified side: Secondary | ICD-10-CM

## 2013-08-02 MED ORDER — GLUCOSE BLOOD VI STRP: ORAL_STRIP | Status: DC

## 2013-08-02 MED ORDER — FINGERSTIX LANCETS MISC: Status: DC

## 2013-08-02 NOTE — Progress Notes (Signed)
Subjective:    Patient ID: [name], female    DOB: [birth year], 79 y.o.   MRN: [id number]  HPI 79YO female presents to follow up DM.  DM - BG near 90s-100s mostly.  Low back pain - Back pain improved. Only intermittent mild aching pain. Does not want to pursue additional evaluation at this time.  Review of Systems  Constitutional: Negative for fever, chills, appetite change, fatigue and unexpected weight change.  HENT: Negative for congestion, ear pain, sinus pressure, sore throat, trouble swallowing and voice change.   Eyes: Negative for visual disturbance.  Respiratory: Negative for cough, shortness of breath, wheezing and stridor.   Cardiovascular: Negative for chest pain, palpitations and leg swelling.  Gastrointestinal: Negative for nausea, vomiting, abdominal pain, diarrhea, constipation, blood in stool, abdominal distention and anal bleeding.  Genitourinary: Negative for dysuria and flank pain.  Musculoskeletal: Positive for back pain. Negative for arthralgias, gait problem, myalgias and neck pain.  Skin: Negative for color change and rash.  Neurological: Negative for dizziness and headaches.  Hematological: Negative for adenopathy. Does not bruise/bleed easily.  Psychiatric/Behavioral: Negative for suicidal ideas, sleep disturbance and dysphoric mood. The patient is not nervous/anxious.        Objective:    BP 114/64  Pulse 66  Temp(Src) 97.7 F (36.5 C) (Oral)  Ht 5' 2.75" (1.594 m)  Wt 174 lb 8 oz (79.153 kg)  BMI 31.15 kg/m2  SpO2 95% Physical Exam  Constitutional: She is oriented to person, place, and time. She appears well-developed and well-nourished. No distress.  HENT:  Head: Normocephalic and atraumatic.  Right Ear: External ear normal.  Left Ear: External ear normal.  Nose: Nose normal.  Mouth/Throat: Oropharynx is clear and moist. No oropharyngeal exudate.  Eyes: Conjunctivae are normal. Pupils are equal, round, and reactive to light. Right eye  exhibits no discharge. Left eye exhibits no discharge. No scleral icterus.  Neck: Normal range of motion. Neck supple. No tracheal deviation present. No thyromegaly present.  Cardiovascular: Normal rate, regular rhythm, normal heart sounds and intact distal pulses.  Exam reveals no gallop and no friction rub.   No murmur heard. Pulmonary/Chest: Effort normal and breath sounds normal. No accessory muscle usage. Not tachypneic. No respiratory distress. She has no decreased breath sounds. She has no wheezes. She has no rhonchi. She has no rales. She exhibits no tenderness.  Musculoskeletal: Normal range of motion. She exhibits no edema and no tenderness.  Lymphadenopathy:    She has no cervical adenopathy.  Neurological: She is alert and oriented to person, place, and time. No cranial nerve deficit. She exhibits normal muscle tone. Coordination normal.  Skin: Skin is warm and dry. No rash noted. She is not diaphoretic. No erythema. No pallor.  Psychiatric: She has a normal mood and affect. Her behavior is normal. Judgment and thought content normal.          Assessment & Plan:   Problem List Items Addressed This Visit     High   Diabetes mellitus type 2, controlled - Primary     BG well controlled, plan to recheck A1c in 09/2013. Continue healthy diet and exercise.    Relevant Medications      glucose blood test strip      FINGERSTIX LANCETS MISC   Hypertension      BP Readings from Last 3 Encounters:  08/02/13 114/64  07/03/13 130/80  03/23/13 118/80   BP well controlled on current medications. Will continue.  Unprioritized   Right sided sciatica     Symptoms improved. Will plan to monitor for now. We discussed possible referral to ortho if recurrent pain. Discussed results of recent lumbar spine xray showing degenerative changes.        Return if symptoms worsen or fail to improve.

## 2013-08-02 NOTE — Assessment & Plan Note (Signed)
Symptoms improved. Will plan to monitor for now. We discussed possible referral to ortho if recurrent pain. Discussed results of recent lumbar spine xray showing degenerative changes.

## 2013-08-02 NOTE — Assessment & Plan Note (Signed)
BG well controlled, plan to recheck A1c in 09/2013. Continue healthy diet and exercise.

## 2013-08-02 NOTE — Assessment & Plan Note (Signed)
BP Readings from Last 3 Encounters:  08/02/13 114/64  07/03/13 130/80  03/23/13 118/80   BP well controlled on current medications. Will continue.

## 2013-08-02 NOTE — Progress Notes (Signed)
Pre visit review using our clinic review tool, if applicable. No additional management support is needed unless otherwise documented below in the visit note. 

## 2013-08-03 ENCOUNTER — Telehealth: Payer: Self-pay | Admitting: Internal Medicine

## 2013-08-03 NOTE — Telephone Encounter (Signed)
Relevant patient education assigned to patient using Emmi. ° °

## 2013-09-18 ENCOUNTER — Encounter: Payer: Medicare Other | Admitting: Internal Medicine

## 2013-09-25 ENCOUNTER — Encounter: Payer: Self-pay | Admitting: Internal Medicine

## 2013-09-25 ENCOUNTER — Telehealth: Payer: Self-pay | Admitting: Internal Medicine

## 2013-09-25 ENCOUNTER — Ambulatory Visit (INDEPENDENT_AMBULATORY_CARE_PROVIDER_SITE_OTHER): Payer: Medicare Other | Admitting: Internal Medicine

## 2013-09-25 VITALS — BP 120/78 | HR 57 | Temp 98.3°F | Ht 62.5 in | Wt 173.5 lb

## 2013-09-25 DIAGNOSIS — R9389 Abnormal findings on diagnostic imaging of other specified body structures: Secondary | ICD-10-CM

## 2013-09-25 DIAGNOSIS — M8580 Other specified disorders of bone density and structure, unspecified site: Secondary | ICD-10-CM

## 2013-09-25 DIAGNOSIS — R252 Cramp and spasm: Secondary | ICD-10-CM

## 2013-09-25 DIAGNOSIS — Z1211 Encounter for screening for malignant neoplasm of colon: Secondary | ICD-10-CM

## 2013-09-25 DIAGNOSIS — M899 Disorder of bone, unspecified: Secondary | ICD-10-CM

## 2013-09-25 DIAGNOSIS — D492 Neoplasm of unspecified behavior of bone, soft tissue, and skin: Secondary | ICD-10-CM | POA: Insufficient documentation

## 2013-09-25 DIAGNOSIS — Z0000 Encounter for general adult medical examination without abnormal findings: Secondary | ICD-10-CM

## 2013-09-25 DIAGNOSIS — M949 Disorder of cartilage, unspecified: Secondary | ICD-10-CM

## 2013-09-25 DIAGNOSIS — I10 Essential (primary) hypertension: Secondary | ICD-10-CM

## 2013-09-25 DIAGNOSIS — L989 Disorder of the skin and subcutaneous tissue, unspecified: Secondary | ICD-10-CM

## 2013-09-25 DIAGNOSIS — E119 Type 2 diabetes mellitus without complications: Secondary | ICD-10-CM

## 2013-09-25 DIAGNOSIS — E039 Hypothyroidism, unspecified: Secondary | ICD-10-CM

## 2013-09-25 LAB — COMPREHENSIVE METABOLIC PANEL
ALBUMIN: 4.2 g/dL (ref 3.5–5.2)
ALK PHOS: 72 U/L (ref 39–117)
ALT: 48 U/L — ABNORMAL HIGH (ref 0–35)
AST: 31 U/L (ref 0–37)
BUN: 19 mg/dL (ref 6–23)
CHLORIDE: 105 meq/L (ref 96–112)
CO2: 24 mEq/L (ref 19–32)
Calcium: 9.4 mg/dL (ref 8.4–10.5)
Creatinine, Ser: 0.8 mg/dL (ref 0.4–1.2)
GFR: 76.18 mL/min (ref >60.00)
GLUCOSE: 105 mg/dL — AB (ref 70–99)
Potassium: 4.1 mEq/L (ref 3.5–5.1)
Sodium: 142 mEq/L (ref 135–145)
Total Bilirubin: 0.8 mg/dL (ref 0.2–1.2)
Total Protein: 7.1 g/dL (ref 6.0–8.3)

## 2013-09-25 LAB — MICROALBUMIN / CREATININE URINE RATIO
CREATININE, U: 114.4 mg/dL
Microalb Creat Ratio: 1.2 mg/g (ref 0.0–30.0)
Microalb, Ur: 1.4 mg/dL (ref 0.0–1.9)

## 2013-09-25 LAB — LIPID PANEL
CHOL/HDL RATIO: 3
Cholesterol: 145 mg/dL (ref 0–200)
HDL: 47.8 mg/dL (ref >39.00)
LDL CALC: 84 mg/dL (ref 0–99)
NonHDL: 97.2
Triglycerides: 65 mg/dL (ref 0.0–149.0)
VLDL: 13 mg/dL (ref 0.0–40.0)

## 2013-09-25 LAB — HM DIABETES FOOT EXAM: HM DIABETIC FOOT EXAM: NORMAL

## 2013-09-25 LAB — MAGNESIUM: MAGNESIUM: 2 mg/dL (ref 1.5–2.5)

## 2013-09-25 LAB — HEMOGLOBIN A1C: Hgb A1c MFr Bld: 6.8 % — ABNORMAL HIGH (ref 4.6–6.5)

## 2013-09-25 LAB — TSH: TSH: 1.24 u[IU]/mL (ref 0.35–4.50)

## 2013-09-25 LAB — VITAMIN D 25 HYDROXY (VIT D DEFICIENCY, FRACTURES): VITD: 32.43 ng/mL (ref 30.00–100.00)

## 2013-09-25 MED ORDER — LEVOTHYROXINE SODIUM 100 MCG PO TABS: 100.0000 ug | ORAL_TABLET | Freq: Every day | ORAL | Status: DC

## 2013-09-25 MED ORDER — ZOSTER VACCINE LIVE 19400 UNT/0.65ML ~~LOC~~ SOLR: 0.6500 mL | Freq: Once | SUBCUTANEOUS | Status: DC

## 2013-09-25 NOTE — Assessment & Plan Note (Signed)
Lab Results  Component Value Date   HGBA1C 6.5 03/23/2013   Will check A1c with labs today. Continue diet control of BG. Foot exam normal today.

## 2013-09-25 NOTE — Telephone Encounter (Signed)
Needs follow up MRI abdomen

## 2013-09-25 NOTE — Addendum Note (Signed)
Addended by: Ronette Deter A on: 09/25/2013 04:18 PM   Modules accepted: Orders

## 2013-09-25 NOTE — Progress Notes (Signed)
Subjective:    Patient ID: [name], female    DOB: Mar 10, 1934, 79 y.o.   MRN: [id number]  HPI The patient is here for annual Medicare wellness examination and management of other chronic and acute problems.   The risk factors are reflected in the social history.  The roster of all physicians providing medical care to patient - is listed in the Snapshot section of the chart.  Activities of daily living:  The patient is 100% independent in all ADLs: dressing, toileting, feeding as well as independent mobility. Lives with husband. No pets. Has two story home, bedroom in first floor.  Home safety : The patient has smoke detectors in the home. They wear seatbelts.  There are no firearms at home. There is no violence in the home.   There is no risks for hepatitis, STDs or HIV. There is no history of blood transfusion. They have no travel history to infectious disease endemic areas of the world.  The patient has not seen their dentist in the last six month. Dentist -Dr. Ronita Hipps  They have seen their eye doctor in the last year. Dr. Mordecai Rasmussen - [facility] No issues with hearing. They have deferred audiologic testing in the last year.   They do not  have excessive sun exposure. Discussed the need for sun protection: hats, long sleeves and use of sunscreen if there is significant sun exposure. No dermatologist  Diet: the importance of a healthy diet is discussed. They do have a healthy diet.  The benefits of regular aerobic exercise were discussed. She uses stationary bike on occasion.   Depression screen: there are no signs or vegative symptoms of depression- irritability, change in appetite, anhedonia, sadness/tearfullness.  Cognitive assessment: the patient manages all their financial and personal affairs and is actively engaged. They could relate day,date,year and events.  The following portions of the patient's history were reviewed and updated as appropriate: allergies, current  medications, past family history, past medical history,  past surgical history, past social history  and problem list.  Visual acuity was not assessed per patient preference since she has regular follow up with her ophthalmologist. Hearing and body mass index were assessed and reviewed.   During the course of the visit the patient was educated and counseled about appropriate screening and preventive services including : fall prevention , diabetes screening, nutrition counseling, colorectal cancer screening, and recommended immunizations.    In regards to diabetes, BG have been near 100 fasting. She has not been taking any medications. She has been trying to control blood sugars with diet alone.  Previously, had some episodes with food getting stuck in throat, however this has improved.  Review of Systems  Constitutional: Negative for fever, chills, appetite change, fatigue and unexpected weight change.  Eyes: Negative for visual disturbance.  Respiratory: Negative for shortness of breath.   Cardiovascular: Negative for chest pain and leg swelling.  Gastrointestinal: Negative for nausea, vomiting, abdominal pain, diarrhea and constipation.  Musculoskeletal: Negative for arthralgias and myalgias.  Skin: Negative for color change and rash.  Hematological: Negative for adenopathy. Does not bruise/bleed easily.  Psychiatric/Behavioral: Negative for sleep disturbance and dysphoric mood. The patient is not nervous/anxious.        Objective:    BP 120/78  Pulse 57  Temp(Src) 98.3 F (36.8 C) (Oral)  Ht 5' 2.5" (1.588 m)  Wt 173 lb 8 oz (78.699 kg)  BMI 31.21 kg/m2  SpO2 95% Physical Exam  Constitutional: She is oriented to person,  place, and time. She appears well-developed and well-nourished. No distress.  HENT:  Head: Normocephalic and atraumatic.  Right Ear: External ear normal.  Left Ear: External ear normal.  Nose: Nose normal.  Mouth/Throat: Oropharynx is clear and moist. No  oropharyngeal exudate.  Eyes: Conjunctivae are normal. Pupils are equal, round, and reactive to light. Right eye exhibits no discharge. Left eye exhibits no discharge. No scleral icterus.  Neck: Normal range of motion. Neck supple. No tracheal deviation present. No thyromegaly present.  Cardiovascular: Normal rate, regular rhythm, normal heart sounds and intact distal pulses.  Exam reveals no gallop and no friction rub.   No murmur heard. Pulmonary/Chest: Effort normal and breath sounds normal. No accessory muscle usage. Not tachypneic. No respiratory distress. She has no decreased breath sounds. She has no wheezes. She has no rales. She exhibits no tenderness. Right breast exhibits no inverted nipple, no mass, no nipple discharge, no skin change and no tenderness. Left breast exhibits no inverted nipple, no mass, no nipple discharge, no skin change and no tenderness. Breasts are symmetrical.  Abdominal: Soft. Bowel sounds are normal. She exhibits no distension and no mass. There is no tenderness. There is no rebound and no guarding.  Musculoskeletal: Normal range of motion. She exhibits no edema and no tenderness.  Lymphadenopathy:    She has no cervical adenopathy.  Neurological: She is alert and oriented to person, place, and time. No cranial nerve deficit. She exhibits normal muscle tone. Coordination normal.  Skin: Skin is warm and dry. Lesion noted. No rash noted. She is not diaphoretic. No erythema. No pallor.  Skin colored papules on right medial cheek, right lateral trunk c/w SK  Psychiatric: She has a normal mood and affect. Her behavior is normal. Judgment and thought content normal.          Assessment & Plan:   Problem List Items Addressed This Visit     High   Diabetes mellitus type 2, controlled      Lab Results  Component Value Date   HGBA1C 6.5 03/23/2013   Will check A1c with labs today. Continue diet control of BG. Foot exam normal today.    Relevant Orders       Comprehensive metabolic panel      Hemoglobin A1c      Lipid panel      Microalbumin / creatinine urine ratio   Hypertension      BP Readings from Last 3 Encounters:  09/25/13 120/78  08/02/13 114/64  07/03/13 130/80   BP well controlled on current medication. Renal function with labs today.    Hypothyroidism     Will check TSH with labs today.    Relevant Medications      levothyroxine (SYNTHROID, LEVOTHROID) tablet     Unprioritized   Abnormal CT scan, chest     Recommended follow up pancreatic cyst with MRI.     Medicare annual wellness visit, subsequent - Primary     General medical exam including breast exam normal today. Pelvic and Pap deferred given patient's age and previous normal exams. Colonoscopy is up-to-date. Mammogram is overdue and will be scheduled today. Labs today including CBC, CMP, lipids, A1c. Encourage continued efforts at healthy diet and regular physical activity. Appropriate screening performed.      Osteopenia     Will check Vit D with labs today.    Relevant Orders      Vitamin D (25 hydroxy)   Skin lesion   Relevant Orders  Ambulatory referral to Dermatology   Special screening for malignant neoplasms, colon   Relevant Orders      Fecal occult blood, imunochemical    Other Visit Diagnoses   Unspecified hypothyroidism        Relevant Medications       levothyroxine (SYNTHROID, LEVOTHROID) tablet    Other Relevant Orders       TSH    Cramp of both lower extremities        Relevant Orders       Magnesium        Return in about 3 months (around 12/26/2013) for Recheck.

## 2013-09-25 NOTE — Progress Notes (Signed)
Pre visit review using our clinic review tool, if applicable. No additional management support is needed unless otherwise documented below in the visit note. 

## 2013-09-25 NOTE — Assessment & Plan Note (Signed)
BP Readings from Last 3 Encounters:  09/25/13 120/78  08/02/13 114/64  07/03/13 130/80   BP well controlled on current medication. Renal function with labs today.

## 2013-09-25 NOTE — Assessment & Plan Note (Signed)
Will check TSH with labs today. 

## 2013-09-25 NOTE — Assessment & Plan Note (Signed)
General medical exam including breast exam normal today. Pelvic and Pap deferred given patient's age and previous normal exams. Colonoscopy is up-to-date. Mammogram is overdue and will be scheduled today. Labs today including CBC, CMP, lipids, A1c. Encourage continued efforts at healthy diet and regular physical activity. Appropriate screening performed.

## 2013-09-25 NOTE — Assessment & Plan Note (Signed)
Recommended follow up pancreatic cyst with MRI.

## 2013-09-25 NOTE — Assessment & Plan Note (Signed)
Skin lesions consistent with SK. Referral placed to dermatology for biopsy.

## 2013-09-25 NOTE — Assessment & Plan Note (Signed)
Will check Vit D with labs today. 

## 2013-09-25 NOTE — Patient Instructions (Signed)

## 2013-10-01 ENCOUNTER — Other Ambulatory Visit: Payer: Self-pay | Admitting: Internal Medicine

## 2013-10-06 ENCOUNTER — Ambulatory Visit: Payer: Self-pay | Admitting: Internal Medicine

## 2013-10-08 ENCOUNTER — Telehealth: Payer: Self-pay | Admitting: Internal Medicine

## 2013-10-08 NOTE — Telephone Encounter (Signed)
Sent mychart message

## 2013-10-08 NOTE — Telephone Encounter (Signed)
MRI of the abdomen showed no cyst or other lesion in the pancreas. MRI was normal except for fatty infiltration in the liver. I would recommend continued efforts at healthy diet and exercise, as she is doing.

## 2013-10-30 ENCOUNTER — Encounter: Payer: Self-pay | Admitting: Internal Medicine

## 2013-11-08 ENCOUNTER — Other Ambulatory Visit: Payer: Medicare Other

## 2013-11-09 ENCOUNTER — Other Ambulatory Visit: Payer: Self-pay | Admitting: *Deleted

## 2013-11-09 ENCOUNTER — Other Ambulatory Visit (INDEPENDENT_AMBULATORY_CARE_PROVIDER_SITE_OTHER): Payer: Medicare Other

## 2013-11-09 DIAGNOSIS — Z1211 Encounter for screening for malignant neoplasm of colon: Secondary | ICD-10-CM

## 2013-11-09 LAB — FECAL OCCULT BLOOD, IMMUNOCHEMICAL: Fecal Occult Bld: NEGATIVE

## 2013-11-21 ENCOUNTER — Ambulatory Visit (INDEPENDENT_AMBULATORY_CARE_PROVIDER_SITE_OTHER): Payer: Medicare Other

## 2013-11-21 DIAGNOSIS — Z23 Encounter for immunization: Secondary | ICD-10-CM

## 2013-11-22 ENCOUNTER — Other Ambulatory Visit: Payer: Self-pay | Admitting: Internal Medicine

## 2013-12-17 ENCOUNTER — Other Ambulatory Visit: Payer: Self-pay | Admitting: Internal Medicine

## 2013-12-27 ENCOUNTER — Encounter: Payer: Self-pay | Admitting: Internal Medicine

## 2013-12-27 ENCOUNTER — Ambulatory Visit (INDEPENDENT_AMBULATORY_CARE_PROVIDER_SITE_OTHER): Payer: Medicare Other | Admitting: Internal Medicine

## 2013-12-27 VITALS — BP 130/78 | HR 60 | Temp 98.2°F | Ht 62.5 in | Wt 172.5 lb

## 2013-12-27 DIAGNOSIS — M79642 Pain in left hand: Secondary | ICD-10-CM

## 2013-12-27 DIAGNOSIS — E119 Type 2 diabetes mellitus without complications: Secondary | ICD-10-CM

## 2013-12-27 DIAGNOSIS — I10 Essential (primary) hypertension: Secondary | ICD-10-CM

## 2013-12-27 DIAGNOSIS — L659 Nonscarring hair loss, unspecified: Secondary | ICD-10-CM

## 2013-12-27 DIAGNOSIS — R7989 Other specified abnormal findings of blood chemistry: Secondary | ICD-10-CM

## 2013-12-27 DIAGNOSIS — E039 Hypothyroidism, unspecified: Secondary | ICD-10-CM

## 2013-12-27 DIAGNOSIS — R252 Cramp and spasm: Secondary | ICD-10-CM

## 2013-12-27 DIAGNOSIS — R945 Abnormal results of liver function studies: Secondary | ICD-10-CM

## 2013-12-27 DIAGNOSIS — J0100 Acute maxillary sinusitis, unspecified: Secondary | ICD-10-CM

## 2013-12-27 DIAGNOSIS — J019 Acute sinusitis, unspecified: Secondary | ICD-10-CM | POA: Insufficient documentation

## 2013-12-27 LAB — COMPREHENSIVE METABOLIC PANEL
ALBUMIN: 3.8 g/dL (ref 3.5–5.2)
ALT: 43 U/L — ABNORMAL HIGH (ref 0–35)
AST: 30 U/L (ref 0–37)
Alkaline Phosphatase: 75 U/L (ref 39–117)
BUN: 19 mg/dL (ref 6–23)
CHLORIDE: 104 meq/L (ref 96–112)
CO2: 26 mEq/L (ref 19–32)
Calcium: 9.4 mg/dL (ref 8.4–10.5)
Creatinine, Ser: 0.8 mg/dL (ref 0.4–1.2)
GFR: 71.86 mL/min (ref >60.00)
GLUCOSE: 117 mg/dL — AB (ref 70–99)
POTASSIUM: 4 meq/L (ref 3.5–5.1)
Sodium: 139 mEq/L (ref 135–145)
Total Bilirubin: 0.7 mg/dL (ref 0.2–1.2)
Total Protein: 7.2 g/dL (ref 6.0–8.3)

## 2013-12-27 LAB — MICROALBUMIN / CREATININE URINE RATIO
Creatinine,U: 108.1 mg/dL
MICROALB UR: 3.7 mg/dL — AB (ref 0.0–1.9)
Microalb Creat Ratio: 3.4 mg/g (ref 0.0–30.0)

## 2013-12-27 LAB — CBC WITH DIFFERENTIAL/PLATELET
BASOS ABS: 0 10*3/uL (ref 0.0–0.1)
BASOS PCT: 0.2 % (ref 0.0–3.0)
EOS ABS: 0.2 10*3/uL (ref 0.0–0.7)
Eosinophils Relative: 3.2 % (ref 0.0–5.0)
HCT: 42.2 % (ref 36.0–46.0)
HEMOGLOBIN: 13.8 g/dL (ref 12.0–15.0)
LYMPHS ABS: 1.9 10*3/uL (ref 0.7–4.0)
Lymphocytes Relative: 31.2 % (ref 12.0–46.0)
MCHC: 32.8 g/dL (ref 30.0–36.0)
MCV: 89.6 fl (ref 78.0–100.0)
Monocytes Absolute: 0.5 10*3/uL (ref 0.1–1.0)
Monocytes Relative: 8.1 % (ref 3.0–12.0)
NEUTROS ABS: 3.5 10*3/uL (ref 1.4–7.7)
Neutrophils Relative %: 57.3 % (ref 43.0–77.0)
Platelets: 227 10*3/uL (ref 150.0–400.0)
RBC: 4.7 Mil/uL (ref 3.87–5.11)
RDW: 15 % (ref 11.5–15.5)
WBC: 6.1 10*3/uL (ref 4.0–10.5)

## 2013-12-27 LAB — MAGNESIUM: Magnesium: 2.1 mg/dL (ref 1.5–2.5)

## 2013-12-27 LAB — LIPID PANEL
Cholesterol: 145 mg/dL (ref 0–200)
HDL: 42.6 mg/dL (ref >39.00)
LDL Cholesterol: 89 mg/dL (ref 0–99)
NONHDL: 102.4
Total CHOL/HDL Ratio: 3
Triglycerides: 69 mg/dL (ref 0.0–149.0)
VLDL: 13.8 mg/dL (ref 0.0–40.0)

## 2013-12-27 LAB — TSH: TSH: 4.81 u[IU]/mL — ABNORMAL HIGH (ref 0.35–4.50)

## 2013-12-27 LAB — VITAMIN B12: Vitamin B-12: 562 pg/mL (ref 211–911)

## 2013-12-27 LAB — HEMOGLOBIN A1C: HEMOGLOBIN A1C: 6.6 % — AB (ref 4.6–6.5)

## 2013-12-27 MED ORDER — AMOXICILLIN-POT CLAVULANATE 875-125 MG PO TABS: 1.0000 | ORAL_TABLET | Freq: Two times a day (BID) | ORAL | Status: DC

## 2013-12-27 MED ORDER — ONETOUCH ULTRA SYSTEM W/DEVICE KIT: 1.0000 | PACK | Freq: Once | Status: DC

## 2013-12-27 NOTE — Progress Notes (Signed)
Subjective:    Patient ID: [name], female    DOB: [birth year], 79 y.o.   MRN: [id number]  HPI 79YO female presents for follow up.  DM - BG well controlled. Did not bring record today.  Recently traveled to [place]. Had sinus infection. Had fever, chills, malaise. Never seen by physician. Symptoms have improved but continues to have bilateral sinus pressure. Having bilateral ear pain, occasional sore throat.  Left thumb pain - for last several weeks. Had some swelling, which has resolved.  Having leg cramps at night. Sometimes wakes her from sleep.  Review of Systems  Constitutional: Positive for fatigue. Negative for fever, chills, appetite change and unexpected weight change.  HENT: Positive for congestion, postnasal drip, rhinorrhea, sinus pressure and sore throat. Negative for trouble swallowing and voice change.   Eyes: Negative for visual disturbance.  Respiratory: Negative for cough and shortness of breath.   Cardiovascular: Negative for chest pain and leg swelling.  Gastrointestinal: Negative for nausea, vomiting, abdominal pain, diarrhea and constipation.  Musculoskeletal: Positive for myalgias. Negative for arthralgias.  Skin: Negative for color change and rash.  Hematological: Negative for adenopathy. Does not bruise/bleed easily.  Psychiatric/Behavioral: Positive for sleep disturbance. Negative for dysphoric mood. The patient is not nervous/anxious.        Objective:    BP 130/78 mmHg  Pulse 60  Temp(Src) 98.2 F (36.8 C) (Oral)  Ht 5' 2.5" (1.588 m)  Wt 172 lb 8 oz (78.245 kg)  BMI 31.03 kg/m2  SpO2 95% Physical Exam  Constitutional: She is oriented to person, place, and time. She appears well-developed and well-nourished. No distress.  HENT:  Head: Normocephalic and atraumatic.  Right Ear: External ear normal. Tympanic membrane is bulging. Tympanic membrane is not erythematous. A middle ear effusion is present.  Left Ear: External ear normal. Tympanic  membrane is bulging. Tympanic membrane is not erythematous. A middle ear effusion is present.  Nose: Nose normal.  Mouth/Throat: Oropharynx is clear and moist. No oropharyngeal exudate.  Eyes: Conjunctivae are normal. Pupils are equal, round, and reactive to light. Right eye exhibits no discharge. Left eye exhibits no discharge. No scleral icterus.  Neck: Normal range of motion. Neck supple. No tracheal deviation present. No thyromegaly present.  Cardiovascular: Normal rate, regular rhythm, normal heart sounds and intact distal pulses.  Exam reveals no gallop and no friction rub.   No murmur heard. Pulmonary/Chest: Effort normal and breath sounds normal. No accessory muscle usage. No tachypnea. No respiratory distress. She has no decreased breath sounds. She has no wheezes. She has no rhonchi. She has no rales. She exhibits no tenderness.  Musculoskeletal: Normal range of motion. She exhibits no edema.       Left hand: She exhibits tenderness (over anatomic snuff box).  Lymphadenopathy:    She has no cervical adenopathy.  Neurological: She is alert and oriented to person, place, and time. No cranial nerve deficit. She exhibits normal muscle tone. Coordination normal.  Skin: Skin is warm and dry. No rash noted. She is not diaphoretic. No erythema. No pallor.  Psychiatric: She has a normal mood and affect. Her behavior is normal. Judgment and thought content normal.          Assessment & Plan:   Problem List Items Addressed This Visit      High   Diabetes mellitus type 2, controlled - Primary    Will check A1c with labs today.    Relevant Orders      Comprehensive metabolic  panel      Hemoglobin A1c      Lipid panel      Microalbumin / creatinine urine ratio   Hypertension    BP Readings from Last 3 Encounters:  12/27/13 130/78  09/25/13 120/78  08/02/13 114/64   BP well controlled. Will check renal function with labs. Continue current meds.    Hypothyroidism    Will check  TSH with labs today.      Unprioritized   Acute maxillary sinusitis    Symptoms consistent with maxillary sinusitis. Will start Augmentin. Encouraged her to take a probiotic while on this medication. Follow up for recheck in 3-4 weeks.    Relevant Medications      AMOXICILLIN-POT CLAVULANATE 875-125 MG PO TABS   Cramp of both lower extremities    Recent muscle cramping in lower legs. Exam is normal. Will check CBC, CMP, B12, Mag with labs.    Relevant Orders      Magnesium      B12      CBC w/Diff   Elevated LFTs    Will recheck LFTs with labs today.    Hair loss    Recent thinning of hair noted by pt. Will check thyroid function, CBC, CMP with labs.    Relevant Orders      TSH   Left hand pain    Left hand pain over anatomic snuff box. Recommended xray for evaluation of scaphoid fracture. Pt declines, prefers to observe for now.        Return in about 4 weeks (around 01/24/2014).

## 2013-12-27 NOTE — Assessment & Plan Note (Signed)
Will check A1c with labs today. 

## 2013-12-27 NOTE — Assessment & Plan Note (Signed)
Recent muscle cramping in lower legs. Exam is normal. Will check CBC, CMP, B12, Mag with labs.

## 2013-12-27 NOTE — Assessment & Plan Note (Signed)
BP Readings from Last 3 Encounters:  12/27/13 130/78  09/25/13 120/78  08/02/13 114/64   BP well controlled. Will check renal function with labs. Continue current meds.

## 2013-12-27 NOTE — Progress Notes (Signed)
Pre visit review using our clinic review tool, if applicable. No additional management support is needed unless otherwise documented below in the visit note. 

## 2013-12-27 NOTE — Assessment & Plan Note (Signed)
Symptoms consistent with maxillary sinusitis. Will start Augmentin. Encouraged her to take a probiotic while on this medication. Follow up for recheck in 3-4 weeks.

## 2013-12-27 NOTE — Assessment & Plan Note (Signed)
Left hand pain over anatomic snuff box. Recommended xray for evaluation of scaphoid fracture. Pt declines, prefers to observe for now.

## 2013-12-27 NOTE — Patient Instructions (Addendum)
Start Augmentin to treat sinus infection.  Try to eat yogurt (Triple Zero) or take a probiotic daily while on Augmentin.    Labs today.  Follow up in 4 weeks.

## 2013-12-27 NOTE — Assessment & Plan Note (Signed)
Recent thinning of hair noted by pt. Will check thyroid function, CBC, CMP with labs.

## 2013-12-27 NOTE — Assessment & Plan Note (Signed)
Will recheck LFTs with labs today. 

## 2013-12-27 NOTE — Assessment & Plan Note (Signed)
Will check TSH with labs today. 

## 2013-12-28 ENCOUNTER — Encounter: Payer: Self-pay | Admitting: *Deleted

## 2013-12-28 ENCOUNTER — Other Ambulatory Visit: Payer: Self-pay | Admitting: *Deleted

## 2013-12-28 MED ORDER — LEVOTHYROXINE SODIUM 112 MCG PO TABS
112.0000 ug | ORAL_TABLET | Freq: Every day | ORAL | Status: DC
Start: 2013-12-28 — End: 2014-03-13

## 2014-01-31 ENCOUNTER — Ambulatory Visit: Payer: Medicare Other | Admitting: Nurse Practitioner

## 2014-02-06 ENCOUNTER — Encounter: Payer: Self-pay | Admitting: Internal Medicine

## 2014-02-06 ENCOUNTER — Ambulatory Visit (INDEPENDENT_AMBULATORY_CARE_PROVIDER_SITE_OTHER): Payer: Medicare Other | Admitting: Internal Medicine

## 2014-02-06 VITALS — BP 133/83 | HR 66 | Temp 97.8°F | Ht 62.5 in | Wt 170.5 lb

## 2014-02-06 DIAGNOSIS — E119 Type 2 diabetes mellitus without complications: Secondary | ICD-10-CM

## 2014-02-06 DIAGNOSIS — H109 Unspecified conjunctivitis: Secondary | ICD-10-CM | POA: Insufficient documentation

## 2014-02-06 DIAGNOSIS — E039 Hypothyroidism, unspecified: Secondary | ICD-10-CM

## 2014-02-06 DIAGNOSIS — I10 Essential (primary) hypertension: Secondary | ICD-10-CM

## 2014-02-06 LAB — T4, FREE: Free T4: 0.97 ng/dL (ref 0.60–1.60)

## 2014-02-06 LAB — TSH: TSH: 1.36 u[IU]/mL (ref 0.35–4.50)

## 2014-02-06 NOTE — Progress Notes (Signed)
Pre visit review using our clinic review tool, if applicable. No additional management support is needed unless otherwise documented below in the visit note. 

## 2014-02-06 NOTE — Assessment & Plan Note (Signed)
Lab Results  Component Value Date   HGBA1C 6.6* 12/27/2013   BG have been well controlled with diet alone. Will continue to monitor.

## 2014-02-06 NOTE — Patient Instructions (Addendum)
Labs today.  [facility] appointment at 3:45pm today.

## 2014-02-06 NOTE — Assessment & Plan Note (Signed)
BP Readings from Last 3 Encounters:  02/06/14 133/83  12/27/13 130/78  09/25/13 120/78   BP well controlled on current medication. Will continue.
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
## 2014-02-06 NOTE — Assessment & Plan Note (Signed)
Levothyroxine recently increased to 157mcg daily. Will check TSH with labs today.

## 2014-02-06 NOTE — Assessment & Plan Note (Signed)
Persistent and worsening symptoms despite use of topical antibiotics. Evaluation with opthalmology set up for today. Question if she may have resistant bacterial infection or herpetic infection, leading to worsening symptoms.
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
## 2014-02-12 ENCOUNTER — Ambulatory Visit: Payer: Self-pay | Admitting: Internal Medicine

## 2014-02-12 LAB — HM MAMMOGRAPHY: HM MAMMO: NEGATIVE

## 2014-02-19 DIAGNOSIS — H4011X3 Primary open-angle glaucoma, severe stage: Secondary | ICD-10-CM | POA: Diagnosis not present

## 2014-02-27 DIAGNOSIS — T7840XA Allergy, unspecified, initial encounter: Secondary | ICD-10-CM | POA: Diagnosis not present

## 2014-03-13 ENCOUNTER — Encounter: Payer: Self-pay | Admitting: Internal Medicine

## 2014-03-13 ENCOUNTER — Ambulatory Visit (INDEPENDENT_AMBULATORY_CARE_PROVIDER_SITE_OTHER): Payer: Medicare Other | Admitting: Internal Medicine

## 2014-03-13 VITALS — BP 129/77 | HR 57 | Temp 97.4°F | Ht 62.5 in | Wt 172.8 lb

## 2014-03-13 DIAGNOSIS — I10 Essential (primary) hypertension: Secondary | ICD-10-CM | POA: Diagnosis not present

## 2014-03-13 DIAGNOSIS — E039 Hypothyroidism, unspecified: Secondary | ICD-10-CM

## 2014-03-13 DIAGNOSIS — E119 Type 2 diabetes mellitus without complications: Secondary | ICD-10-CM

## 2014-03-13 DIAGNOSIS — L989 Disorder of the skin and subcutaneous tissue, unspecified: Secondary | ICD-10-CM | POA: Diagnosis not present

## 2014-03-13 DIAGNOSIS — H109 Unspecified conjunctivitis: Secondary | ICD-10-CM | POA: Diagnosis not present

## 2014-03-13 MED ORDER — LOSARTAN POTASSIUM 50 MG PO TABS: ORAL_TABLET | ORAL | Status: DC

## 2014-03-13 MED ORDER — LEVOTHYROXINE SODIUM 112 MCG PO TABS: 112.0000 ug | ORAL_TABLET | Freq: Every day | ORAL | Status: DC

## 2014-03-13 MED ORDER — MONTELUKAST SODIUM 10 MG PO TABS: ORAL_TABLET | ORAL | Status: DC

## 2014-03-13 NOTE — Progress Notes (Signed)
Subjective:    Patient ID: [name], female    DOB: 05-Dec-1934, 80 y.o.   MRN: [id number]  HPI  80YO female presents for follow up.  Last seen 01/2014. Treated for bilateral conjunctivitis.  DM - BG running near 90s-110s.  Concerned about several small, raised lesions on her cheeks. Unsure how long these have been present.  Past medical, surgical, family and social history per today's encounter.  Review of Systems  Constitutional: Negative for fever, chills, appetite change, fatigue and unexpected weight change.  Eyes: Negative for visual disturbance.  Respiratory: Negative for shortness of breath.   Cardiovascular: Negative for chest pain and leg swelling.  Gastrointestinal: Negative for abdominal pain, diarrhea and constipation.  Musculoskeletal: Positive for myalgias (chronic right mid back).  Skin: Negative for color change and rash.  Hematological: Negative for adenopathy. Does not bruise/bleed easily.  Psychiatric/Behavioral: Negative for dysphoric mood. The patient is not nervous/anxious.        Objective:    BP 129/77 mmHg  Pulse 57  Temp(Src) 97.4 F (36.3 C) (Oral)  Ht 5' 2.5" (1.588 m)  Wt 172 lb 12 oz (78.359 kg)  BMI 31.07 kg/m2  SpO2 97% Physical Exam  Constitutional: She is oriented to person, place, and time. She appears well-developed and well-nourished. No distress.  HENT:  Head: Normocephalic and atraumatic.  Right Ear: External ear normal.  Left Ear: External ear normal.  Nose: Nose normal.  Mouth/Throat: Oropharynx is clear and moist. No oropharyngeal exudate.  Eyes: Conjunctivae are normal. Pupils are equal, round, and reactive to light. Right eye exhibits no discharge. Left eye exhibits no discharge. No scleral icterus.  Neck: Normal range of motion. Neck supple. No tracheal deviation present. No thyromegaly present.  Cardiovascular: Normal rate, regular rhythm, normal heart sounds and intact distal pulses.  Exam reveals no gallop and  no friction rub.   No murmur heard. Pulmonary/Chest: Effort normal and breath sounds normal. No accessory muscle usage. No tachypnea. No respiratory distress. She has no decreased breath sounds. She has no wheezes. She has no rhonchi. She has no rales. She exhibits no tenderness.  Musculoskeletal: Normal range of motion. She exhibits no edema or tenderness.  Lymphadenopathy:    She has no cervical adenopathy.  Neurological: She is alert and oriented to person, place, and time. No cranial nerve deficit. She exhibits normal muscle tone. Coordination normal.  Skin: Skin is warm and dry. No rash noted. She is not diaphoretic. No erythema. No pallor.     Psychiatric: She has a normal mood and affect. Her behavior is normal. Judgment and thought content normal.          Assessment & Plan:   Problem List Items Addressed This Visit      High   Diabetes mellitus type 2, controlled    Lab Results  Component Value Date   HGBA1C 6.6* 12/27/2013   BG well controlled. Plan repeat A1c in 05/2014.      Relevant Medications   losartan (COZAAR) tablet   Hypertension - Primary    BP Readings from Last 3 Encounters:  03/13/14 129/77  02/06/14 133/83  12/27/13 130/78   BP well controlled. Continue Losartan.      Relevant Medications   losartan (COZAAR) tablet   Hypothyroidism    Recent TSH normal on higher dose of Levothyroxine. Will continue.      Relevant Medications   levothyroxine (SYNTHROID, LEVOTHROID) tablet     Unprioritized   Bilateral conjunctivitis  Secondary to allergy to Timolol. Improved off medication.      Skin lesion of face    Lesions most consistent with SK. Will set up dermatology evaluation.      Relevant Orders   Ambulatory referral to Dermatology       Return in about 3 months (around 06/12/2014) for Recheck of Diabetes.

## 2014-03-13 NOTE — Assessment & Plan Note (Signed)
Lab Results  Component Value Date   HGBA1C 6.6* 12/27/2013   BG well controlled. Plan repeat A1c in 05/2014.

## 2014-03-13 NOTE — Progress Notes (Signed)
Pre visit review using our clinic review tool, if applicable. No additional management support is needed unless otherwise documented below in the visit note. 

## 2014-03-13 NOTE — Assessment & Plan Note (Signed)
BP Readings from Last 3 Encounters:  03/13/14 129/77  02/06/14 133/83  12/27/13 130/78   BP well controlled. Continue Losartan.

## 2014-03-13 NOTE — Patient Instructions (Addendum)
Labs in 3 months and follow up in 3 months.

## 2014-03-13 NOTE — Assessment & Plan Note (Signed)
Secondary to allergy to Timolol. Improved off medication.

## 2014-03-13 NOTE — Assessment & Plan Note (Signed)
Lesions most consistent with SK. Will set up dermatology evaluation.

## 2014-03-13 NOTE — Assessment & Plan Note (Signed)
Recent TSH normal on higher dose of Levothyroxine. Will continue.

## 2014-04-23 DIAGNOSIS — H4011X3 Primary open-angle glaucoma, severe stage: Secondary | ICD-10-CM | POA: Diagnosis not present

## 2014-06-04 NOTE — H&P (Signed)
PATIENT NAMEYANILEN, [name] MR#:  [id number] DATE OF BIRTH:  December 29, 1934  DATE OF ADMISSION:  01/04/2012  PRIMARY CARE PHYSICIAN: Ronette Deter, MD  CHIEF COMPLAINT: Chest tightness, nausea.   HISTORY OF PRESENT ILLNESS: This is a 80 year old female patient with history of diabetes and hypertension who presents to the Emergency Room complaining of chest pain earlier in the morning. The patient had some chest tightness along with some nausea and shortness of breath yesterday evening which resolved in one hour. This appeared again at midnight, after a heavy meal. The patient did not sleep yesterday night after finding that her blood sugar was at 228. She was drinking water and repeating her blood sugars and has had anxiety. Today in the morning the patient was feeling some better, but then she had recurrent chest tightness lasting about two hours and presented to the Emergency Room. She did have associated shortness of breath and nausea yesterday, but nothing today. No palpitations or syncope. The patient tends to get chest pain lasting about two hours once a week which is not exertional and with no aggravating or relieving factors. She was worked up her primary care physician with no cause found. She did have a normal stress test two years prior. She has chronic shortness of breath on exertion. Presently no PND, orthopnea, or edema. She is on a baby aspirin at home.   PAST MEDICAL HISTORY:  1. Diabetes. 2. Hypertension. 3. Hypothyroidism.   FAMILY HISTORY: Mother had a heart attack in her 57s, had an aneurysm rupture causing death. Father had a PE. Her sister has diabetes.   SOCIAL HISTORY: The patient does not smoke, no alcohol, and no illicit drugs. Lives at home with her husband. She is originally from [place].   CODE STATUS: FULL CODE.    HOME MEDICATIONS:  1. Atenolol 50 mg oral once a day.  2. Hydrochlorothiazide/losartan 12.5/50 mg oral once a day.  3. Montelukast 10 mg oral once a  day.  4. Synthroid 100 mcg oral once a day.  5. Onglyza 5 mg oral once a day. 6. Lumigan 0.01% ophthalmic solution one drop in each affected eye once a day in the evening.  DRUG ALLERGIES: Cefprozil.  REVIEW OF SYSTEMS: CONSTITUTIONAL: No fever, fatigue, or weakness. EYES: No blurred vision, pain, or redness. ENT: No tinnitus, ear pain, or hearing loss. RESPIRATORY: No cough or wheezing. Does have chronic exertional shortness of breath. No chronic obstructive pulmonary disease. CARDIOVASCULAR: Has chest tightness. No orthopnea, edema, or arrhythmia. GASTROINTESTINAL: Had nausea. No vomiting, diarrhea, abdominal pain, hematemesis, or melena. GENITOURINARY: No dysuria, hematuria, or frequency. ENDOCRINE: Has hypothyroidism. No polyuria or nocturia. HEMATOLOGIC/LYMPHATIC: No anemia, easy bruising, or bleeding. INTEGUMENT: No rash or lesions. MUSCULOSKELETAL: No arthritis, gout, or redness. SKIN: No focal numbness, weakness, or dysarthria. PSYCHIATRIC: No anxiety or depression.   PHYSICAL EXAMINATION:   VITAL SIGNS: Temperature 97.1, pulse 62, respirations 20, blood pressure 155/75, and saturating 97% on room air.   GENERAL: Obese female patient lying in bed, anxious, but cooperative with exam.   PSYCHIATRIC: Alert, awake, and oriented x3. Good judgment.   HEENT: Atraumatic, normocephalic. Oral mucosa moist and pink. No oral ulcers or thrush. External ears and nose normal. No pallor. No icterus. Pupils bilaterally equal and reactive to light.   NECK: Supple. No thyromegaly. No palpable lymph nodes. Trachea midline. No carotid bruit or JVD.   CARDIOVASCULAR: S1 and S2 regular rate and rhythm without any murmurs. No chest wall tenderness.   RESPIRATORY: Normal  work of breathing. Clear to auscultation on both sides.   GASTROINTESTINAL: Soft abdomen, nontender. Bowel sounds present. No hepatosplenomegaly palpable.   SKIN: Warm and dry. No petechiae, rash, or ulcers.   MUSCULOSKELETAL: No joint  swelling, redness, or effusion of the large joints. Normal muscle tone.   NEUROLOGICAL: Motor strength 5 out of 5 in the upper and lower extremities. Sensation to fine touch intact all over. Cranial nerves II through XII grossly intact.  LYMPHATIC: No cervical lymphadenopathy.   RESULTS: Laboratory studies show glucose 128, BUN 18, creatinine 1, sodium 140, and potassium 3.9. GFR 55. CK 334 and CK-MB 4.4. Troponin 0.02. WBC, hemoglobin, and platelets normal.   EKG shows normal sinus rhythm.   Chest x-ray, PA and lateral, shows no acute abnormalities.   ASSESSMENT AND PLAN:  1. Atypical chest pain in a patient with hypertension, diabetes, and is 80 years old. We will admit the patient to rule out acute coronary syndrome as she does have positive MB. Troponin is negative. We will repeat an EKG in the morning, get a stress test, and check two more sets of cardiac enzymes. If the stress test is negative, her pain is likely from gastroesophageal reflux disease and needs a PPI, which will be started now. If the stress test is positive, will need cardiac consult and possible catheterization. The patient is on aspirin and beta blocker.  2. Uncontrolled hypertension. This is likely secondary from acute pain. Continue on medications and monitor.  3. Diabetes mellitus. Seems to be well controlled with blood sugars of 128 at this time. Continue home medications, sliding scale insulin, and diabetic diet.  4. Hypothyroidism. Continue levothyroxine. 5. Deep vein thrombosis prophylaxis. Ambulation.   CODE STATUS: FULL CODE.   TIME SPENT: Time spent today on this case was 40 minutes with more than 50% time spent in coordination of care. ____________________________ Leia Alf. Bhavya Eschete, MD srs:slb D: 01/04/2012 17:31:25 ET T: 01/05/2012 07:58:38 ET JOB#: [id number]  cc: Alveta Heimlich R. Millard Bautch, MD, <Dictator> Eduard Clos. Gilford Rile, MD Alveta Heimlich Arlice Colt MD ELECTRONICALLY SIGNED 01/08/2012 16:17

## 2014-06-04 NOTE — Consult Note (Signed)
PATIENT NAME:  [name], [name] MR#:  [id number] DATE OF BIRTH:  Nov 14, 1934  DATE OF CONSULTATION:  01/04/2012  REFERRING PHYSICIAN:   CONSULTING PHYSICIAN:  Cletis Athens, MD  HISTORY OF PRESENT ILLNESS:  Nina Mondor was admitted to the hospital with chest pain and back pain. She is known to have hypertension and diabetes. Also she has been told she has questionable asthma in [place]. She denies any history of chest pain on walking, on exertion. No sweating. No nausea or vomiting. There is no history of radiation of pain to her neck, shoulder, or arm. She was seen in the Emergency Room for the chest pain. Serial CPK isoenzymes and EKGs were obtained. EKG does not show any acute changes. She is status post cholecystectomy, and also has a history of thyroid dysfunction.   PERSONAL HISTORY: She does not smoke and does not drink.   FAMILY HISTORY:  Remarkable for coronary artery disease on her mother's side.   PHYSICAL EXAMINATION:  GENERAL: The patient is a well-nourished female in no acute distress at the time of examination.   VITAL SIGNS:  Pulse rate 63, blood pressure 119/76.  HEENT: Head normocephalic. Pupils reactive. Sclerae anicteric. Tongue is moist, papillated.   NECK: Supple. Jugular venous pressure is not elevated. Carotid upstroke is 2+ without any bruit. There is no goiter. No lymphadenopathy. Trachea is central.   HEART: Apical impulse is not palpable. Both first and second heart sounds are normal. No murmur is audible.   CHEST: Decreased breath sounds without any rales or rhonchi.   ABDOMEN: Soft, nontender. Liver and spleen are not enlarged. There is no pedal edema. No calf tenderness.  ACCESSORY CLINICAL DATA: Sodium 140, potassium 3.9, chloride 103. CPK 334 with a CK-MB of 4.4, 240 with a CK-MB of 3. Troponin less than 0.02, glucose 128, hemoglobin 13.6. EKG: No acute changes.   IMPRESSION AND RECOMMENDATIONS: The patient was admitted to the hospital with chest pain.  Serial CPK isoenzymes and EKGs ruled out myocardial infarction. One CPK is elevated but troponins are negative. WBC count is normal. She was found to have bradycardia, which was due to her eyedrops and beta blocker. Right now the rhythm is stable. I would suggest that she have a stress test to further evaluate the chest discomfort. Diabetes mellitus seems to be under control as well as hypertension.     ____________________________ Cletis Athens, MD jm:bjt D: 01/04/2012 20:54:53 ET T: 01/05/2012 10:55:15 ET JOB#: [id number]  cc: Cletis Athens, MD, <Dictator> Cletis Athens MD ELECTRONICALLY SIGNED 01/21/2012 13:44

## 2014-06-04 NOTE — Discharge Summary (Signed)
PATIENT NAMESHERRILYNN, [name] MR#:  [id number] DATE OF BIRTH:  Dec 21, 1934  DATE OF ADMISSION:  01/04/2012 DATE OF DISCHARGE:  01/05/2012  PRIMARY CARE PHYSICIAN: Ronette Deter, MD   FINAL DIAGNOSES:  1. Chest pain.  2. Hypertension.  3. Diabetes.  4. Hypothyroidism.  5. Glaucoma.  6. Abnormal chest x-ray, probably a blood vessel. Recommend a follow-up chest x-ray in six weeks.   MEDICATIONS ON DISCHARGE:  1. Onglyza 5 mg daily.  2. Atenolol 50 mg daily.  3. Synthroid 100 mcg daily.  4. Hydrochlorothiazide/losartan 12.5/50 one tablet daily.  5. Montelukast 10 mg at bedtime.  6. Lumigan 0.01% ophthalmic solution one drop each eye in the evening.   7. Istalol 0.5% ophthalmic solution one drop once a day in the morning. 8. Bimatoprost 0.01% ophthalmic solution each eye at bedtime.  9. Aspirin 81 mg daily.   DIET: Low sodium, carbohydrate controlled diet, regular consistency.   ACTIVITY: Activity as tolerated.   FOLLOW-UP: Follow-up in 1 to 2 weeks with Dr. Ronette Deter.   REASON FOR ADMISSION: The patient was admitted 01/04/2012 with chest tightness and nausea.   HISTORY OF PRESENT ILLNESS: The patient is a 80 year old female with hypertension and diabetes who presented with chest pain, did not sleep. Her sugar was up at 228. She came in and was admitted with atypical chest pain for an observation.   LABORATORY AND RADIOLOGICAL DATA DURING THE HOSPITAL COURSE: EKG showed normal sinus rhythm, no acute ST-T wave changes.   Troponin negative x4. Glucose 128, BUN 18, creatinine 1.0, sodium 140, potassium 3.9, chloride 103, CO2 30, calcium 9.2, white blood cell count 6.1, hemoglobin and hematocrit 13.3 and 40.9, platelet count 221.   Chest x-ray showed areas of mildly increased density in the right infrahilar region and right upper lobe. Minimal atelectasis, fibrosis, developing nodular density versus blood vessel. Recommend a follow-up chest x-ray in six weeks.   Stress test no  changes concerning for ischemia. Overall low risk scan. EF 68%.   HOSPITAL COURSE PER PROBLEM LIST:  1. For chest pain, could be musculoskeletal since she also had back pain, could be GI-related. The patient did not want anything for acid reflux. Stress test was negative. Cardiac enzymes were negative. The patient was sent home in stable condition. No further chest pain.  2. For her hypertension, blood pressure was 131/78 upon discharge on her current medications.  3. For her diabetes, she is on Onglyza. Last sugar was 128.  4. Hypothyroidism. She is on Synthroid.  5. Glaucoma. Continue eyedrops.  6. Abnormal chest x-ray. Recommend a follow-up chest x-ray in six weeks through Dr. Ronette Deter.   TIME SPENT ON DISCHARGE: 35 minutes.   ____________________________ Tana Conch. Leslye Peer, MD [id number] D: 01/05/2012 15:38:01 ET T: 01/06/2012 11:17:20 ET JOB#: [id number]  cc: Tana Conch. Leslye Peer, MD, <Dictator> Eduard Clos. Gilford Rile, MD Marisue Brooklyn MD ELECTRONICALLY SIGNED 01/17/2012 17:28

## 2014-06-12 ENCOUNTER — Telehealth: Payer: Self-pay | Admitting: *Deleted

## 2014-06-12 NOTE — Telephone Encounter (Signed)
Pt is coming tomorrow what labs and dx?  

## 2014-06-13 ENCOUNTER — Ambulatory Visit (INDEPENDENT_AMBULATORY_CARE_PROVIDER_SITE_OTHER): Payer: Medicare Other | Admitting: Internal Medicine

## 2014-06-13 ENCOUNTER — Other Ambulatory Visit (INDEPENDENT_AMBULATORY_CARE_PROVIDER_SITE_OTHER): Payer: Medicare Other

## 2014-06-13 ENCOUNTER — Encounter: Payer: Self-pay | Admitting: Internal Medicine

## 2014-06-13 VITALS — BP 133/82 | HR 64 | Temp 97.6°F | Ht 62.5 in | Wt 174.5 lb

## 2014-06-13 DIAGNOSIS — E119 Type 2 diabetes mellitus without complications: Secondary | ICD-10-CM

## 2014-06-13 DIAGNOSIS — E039 Hypothyroidism, unspecified: Secondary | ICD-10-CM

## 2014-06-13 DIAGNOSIS — I10 Essential (primary) hypertension: Secondary | ICD-10-CM

## 2014-06-13 LAB — LIPID PANEL
CHOL/HDL RATIO: 3
Cholesterol: 122 mg/dL (ref 0–200)
HDL: 45.4 mg/dL (ref >39.00)
LDL Cholesterol: 61 mg/dL (ref 0–99)
NonHDL: 76.6
TRIGLYCERIDES: 80 mg/dL (ref 0.0–149.0)
VLDL: 16 mg/dL (ref 0.0–40.0)

## 2014-06-13 LAB — COMPREHENSIVE METABOLIC PANEL
ALBUMIN: 4.2 g/dL (ref 3.5–5.2)
ALT: 35 U/L (ref 0–35)
AST: 24 U/L (ref 0–37)
Alkaline Phosphatase: 75 U/L (ref 39–117)
BUN: 17 mg/dL (ref 6–23)
CALCIUM: 9.3 mg/dL (ref 8.4–10.5)
CHLORIDE: 105 meq/L (ref 96–112)
CO2: 28 meq/L (ref 19–32)
Creatinine, Ser: 0.84 mg/dL (ref 0.40–1.20)
GFR: 69.81 mL/min (ref >60.00)
Glucose, Bld: 126 mg/dL — ABNORMAL HIGH (ref 70–99)
Potassium: 3.9 mEq/L (ref 3.5–5.1)
Sodium: 139 mEq/L (ref 135–145)
TOTAL PROTEIN: 6.7 g/dL (ref 6.0–8.3)
Total Bilirubin: 0.8 mg/dL (ref 0.2–1.2)

## 2014-06-13 LAB — HEMOGLOBIN A1C: Hgb A1c MFr Bld: 6.7 % — ABNORMAL HIGH (ref 4.6–6.5)

## 2014-06-13 NOTE — Patient Instructions (Signed)
Labs today.  Follow up in 6 months or sooner as needed. 

## 2014-06-13 NOTE — Assessment & Plan Note (Signed)
BP Readings from Last 3 Encounters:  06/13/14 133/82  03/13/14 129/77  02/06/14 133/83   BP well controlled. Renal function with labs. Continue current medications.

## 2014-06-13 NOTE — Assessment & Plan Note (Signed)
Will check A1c with labs. Continue diet control.

## 2014-06-13 NOTE — Assessment & Plan Note (Signed)
Recent thyroid function normal. Continue Levothyroxine.

## 2014-06-13 NOTE — Progress Notes (Signed)
   Subjective:    Patient ID: [name], female    DOB: May 02, 1934, 80 y.o.   MRN: [id number]  HPI  80YO female presents for follow up.  DM - Does not check blood sugars generally, had one reading near 113. Follows healthy diet and stays active.   Had possible allergic reaction to Augmentin, with itchy rash on skin over body.   BP Readings from Last 3 Encounters:  06/13/14 133/82  03/13/14 129/77  02/06/14 133/83     Past medical, surgical, family and social history per today's encounter.  Review of Systems  Constitutional: Negative for fever, chills, appetite change, fatigue and unexpected weight change.  Eyes: Negative for visual disturbance.  Respiratory: Negative for shortness of breath.   Cardiovascular: Negative for chest pain and leg swelling.  Gastrointestinal: Negative for nausea, vomiting, abdominal pain, diarrhea and constipation.  Musculoskeletal: Negative for myalgias and arthralgias.  Skin: Negative for color change and rash.  Hematological: Negative for adenopathy. Does not bruise/bleed easily.  Psychiatric/Behavioral: Negative for sleep disturbance and dysphoric mood. The patient is not nervous/anxious.        Objective:    BP 133/82 mmHg  Pulse 64  Temp(Src) 97.6 F (36.4 C) (Oral)  Ht 5' 2.5" (1.588 m)  Wt 174 lb 8 oz (79.153 kg)  BMI 31.39 kg/m2  SpO2 95% Physical Exam  Constitutional: She is oriented to person, place, and time. She appears well-developed and well-nourished. No distress.  HENT:  Head: Normocephalic and atraumatic.  Right Ear: External ear normal.  Left Ear: External ear normal.  Nose: Nose normal.  Mouth/Throat: Oropharynx is clear and moist. No oropharyngeal exudate.  Eyes: Conjunctivae are normal. Pupils are equal, round, and reactive to light. Right eye exhibits no discharge. Left eye exhibits no discharge. No scleral icterus.  Neck: Normal range of motion. Neck supple. No tracheal deviation present. No thyromegaly  present.  Cardiovascular: Normal rate, regular rhythm, normal heart sounds and intact distal pulses.  Exam reveals no gallop and no friction rub.   No murmur heard. Pulmonary/Chest: Effort normal and breath sounds normal. No respiratory distress. She has no wheezes. She has no rales. She exhibits no tenderness.  Musculoskeletal: Normal range of motion. She exhibits no edema or tenderness.  Lymphadenopathy:    She has no cervical adenopathy.  Neurological: She is alert and oriented to person, place, and time. No cranial nerve deficit. She exhibits normal muscle tone. Coordination normal.  Skin: Skin is warm and dry. No rash noted. She is not diaphoretic. No erythema. No pallor.  Psychiatric: She has a normal mood and affect. Her behavior is normal. Judgment and thought content normal.          Assessment & Plan:   Problem List Items Addressed This Visit      High   Diabetes mellitus type 2, controlled - Primary    Will check A1c with labs. Continue diet control.      Hypertension    BP Readings from Last 3 Encounters:  06/13/14 133/82  03/13/14 129/77  02/06/14 133/83   BP well controlled. Renal function with labs. Continue current medications.      Hypothyroidism    Recent thyroid function normal. Continue Levothyroxine.          Return in about 6 months (around 12/13/2014) for Wellness Visit.

## 2014-06-13 NOTE — Progress Notes (Signed)
Pre visit review using our clinic review tool, if applicable. No additional management support is needed unless otherwise documented below in the visit note. 

## 2014-06-13 NOTE — Telephone Encounter (Signed)
CMP, A1c, lipids diabetes

## 2014-06-24 ENCOUNTER — Telehealth: Payer: Self-pay

## 2014-06-24 ENCOUNTER — Other Ambulatory Visit: Payer: Self-pay | Admitting: *Deleted

## 2014-06-24 MED ORDER — ATENOLOL 50 MG PO TABS: ORAL_TABLET | ORAL | Status: DC

## 2014-06-24 NOTE — Telephone Encounter (Signed)
The patient called and is hoping to get a refill of her Atenolol sent to [facility].

## 2014-06-25 DIAGNOSIS — H4011X3 Primary open-angle glaucoma, severe stage: Secondary | ICD-10-CM | POA: Diagnosis not present

## 2014-10-11 DIAGNOSIS — H4011X3 Primary open-angle glaucoma, severe stage: Secondary | ICD-10-CM | POA: Diagnosis not present

## 2014-10-31 ENCOUNTER — Other Ambulatory Visit: Payer: Self-pay | Admitting: Internal Medicine

## 2014-12-09 ENCOUNTER — Telehealth: Payer: Self-pay | Admitting: Internal Medicine

## 2014-12-09 DIAGNOSIS — E119 Type 2 diabetes mellitus without complications: Secondary | ICD-10-CM

## 2014-12-09 NOTE — Telephone Encounter (Signed)
Last labs were drawn in 06/13/14.  Please advise?

## 2014-12-09 NOTE — Telephone Encounter (Signed)
Patient can schedule at her convenience.

## 2014-12-09 NOTE — Telephone Encounter (Signed)
I have placed orders for A1c and CMP to be scheduled at her convenience.

## 2014-12-09 NOTE — Telephone Encounter (Signed)
Pt would like to have blood work done. Need order please and thank You!

## 2014-12-10 NOTE — Telephone Encounter (Signed)
Appt was scheduled for labs. Done.

## 2014-12-13 ENCOUNTER — Other Ambulatory Visit (INDEPENDENT_AMBULATORY_CARE_PROVIDER_SITE_OTHER): Payer: Medicare Other

## 2014-12-13 ENCOUNTER — Ambulatory Visit (INDEPENDENT_AMBULATORY_CARE_PROVIDER_SITE_OTHER): Payer: Medicare Other

## 2014-12-13 VITALS — BP 150/88 | HR 60 | Temp 98.1°F | Resp 12 | Ht 63.0 in | Wt 173.8 lb

## 2014-12-13 DIAGNOSIS — E119 Type 2 diabetes mellitus without complications: Secondary | ICD-10-CM | POA: Diagnosis not present

## 2014-12-13 DIAGNOSIS — Z0000 Encounter for general adult medical examination without abnormal findings: Secondary | ICD-10-CM | POA: Diagnosis not present

## 2014-12-13 LAB — COMPREHENSIVE METABOLIC PANEL
ALBUMIN: 4.3 g/dL (ref 3.5–5.2)
ALK PHOS: 73 U/L (ref 39–117)
ALT: 38 U/L — AB (ref 0–35)
AST: 26 U/L (ref 0–37)
BILIRUBIN TOTAL: 0.7 mg/dL (ref 0.2–1.2)
BUN: 21 mg/dL (ref 6–23)
CALCIUM: 9.5 mg/dL (ref 8.4–10.5)
CO2: 27 meq/L (ref 19–32)
CREATININE: 0.8 mg/dL (ref 0.40–1.20)
Chloride: 106 mEq/L (ref 96–112)
GFR: 73.76 mL/min (ref >60.00)
Glucose, Bld: 104 mg/dL — ABNORMAL HIGH (ref 70–99)
Potassium: 4.3 mEq/L (ref 3.5–5.1)
Sodium: 143 mEq/L (ref 135–145)
TOTAL PROTEIN: 7.2 g/dL (ref 6.0–8.3)

## 2014-12-13 LAB — HEMOGLOBIN A1C: Hgb A1c MFr Bld: 6.5 % (ref 4.6–6.5)

## 2014-12-13 NOTE — Progress Notes (Signed)
Annual Wellness Visit as completed by Health Coach was reviewed in full.  

## 2014-12-13 NOTE — Addendum Note (Signed)
Addended by: Karlene Einstein D on: 12/13/2014 11:37 AM   Modules accepted: Orders

## 2014-12-13 NOTE — Progress Notes (Signed)
Subjective:   [name] is a 80 y.o. female who presents for Medicare Annual (Subsequent) preventive examination.  Review of Systems:  No ROS.  Medicare Wellness Visit. Cardiac Risk Factors include: advanced age (>58mn, >>23women)     Objective:     BP reading with no hypertensive medication taken today  Vitals: BP 150/88 mmHg  Pulse 60  Temp(Src) 98.1 F (36.7 C) (Oral)  Resp 12  Ht 5' 3"  (1.6 m)  Wt 173 lb 12.8 oz (78.835 kg)  BMI 30.79 kg/m2  SpO2 95%  Tobacco History  Smoking status  . Never Smoker   Smokeless tobacco  . Not on file     Counseling given: Not Answered   Past Medical History  Diagnosis Date  . Arthritis   . Chicken pox   . Generalized headaches   . Glaucoma   . Allergy     hay fever  . Colon polyp   . Urinary incontinence   . UTI (urinary tract infection)   . Diabetes mellitus 2008  . Hypertension   . Thyroid disease     hypothyroidism   Past Surgical History  Procedure Laterality Date  . Appendectomy  1998  . Cholecystectomy    . Tonsillectomy    . Bunionectomy    . Eye surgery      R  eye-lid drop surgery   Family History  Problem Relation Age of Onset  . Heart disease Mother   . Asthma Mother   . Heart attack Mother 666 . Heart disease Father   . Deep vein thrombosis Father   . Leukemia Brother   . Cancer Brother   . Arthritis Other   . Diabetes Other   . Diabetes Sister   . Cancer Maternal Aunt     ovarian?  . Crohn's disease Brother   . Diabetes Brother   . Cancer Maternal Grandfather     colon   History  Sexual Activity  . Sexual Activity: Yes    Outpatient Encounter Prescriptions as of 12/13/2014  Medication Sig  . atenolol (TENORMIN) 50 MG tablet Take 1 tablet by mouth  daily  . BIOTIN 5000 PO Take 1 tablet by mouth daily.  . Blood Glucose Monitoring Suppl (ONE TOUCH ULTRA SYSTEM KIT) W/DEVICE KIT 1 kit by Does not apply route once.  .Marland KitchenFINGERSTIX LANCETS MISC Use as directed  . glucose blood  test strip Use as instructed  . levothyroxine (SYNTHROID, LEVOTHROID) 112 MCG tablet Take 1 tablet (112 mcg total) by mouth daily.  .Marland Kitchenlosartan (COZAAR) 50 MG tablet Take 1 tablet by mouth  daily  . montelukast (SINGULAIR) 10 MG tablet Take 1 tablet by mouth  daily  . aspirin 81 MG tablet Take 81 mg by mouth daily.  . Lancets Misc. (ACCU-CHEK SOFTCLIX LANCET DEV) KIT Use at home to test blood sugars daily.   No facility-administered encounter medications on file as of 12/13/2014.    Activities of Daily Living In your present state of health, do you have any difficulty performing the following activities: 12/13/2014  Hearing? N  Vision? N  Difficulty concentrating or making decisions? Y  Walking or climbing stairs? N  Dressing or bathing? N  Doing errands, shopping? N  Preparing Food and eating ? N  Using the Toilet? N  In the past six months, have you accidently leaked urine? N  Do you have problems with loss of bowel control? N  Managing your Medications? N  Managing your Finances?  N  Housekeeping or managing your Housekeeping? N    Patient Care Team: Jackolyn Confer, MD as PCP - General (Internal Medicine)    Assessment:    This is a routine wellness examination for [name].  The goal of the wellness visit is to assist the patient how to close the gaps in care and create a preventative care plan for the patient.   Osteoporosis risk reviewed.  Taking meds without issues; no barriers identified.  Safety issues reviewed; smoke detectors in the home. No firearms in the home.  Wears seatbelts when driving or riding with others. No violence in the home.  No identified risk were noted; The patient was oriented x 3; appropriate in dress and manner and no objective failures at ADL's or IADL's.   High Dose Influenza Vaccine administered today.   TDAP-declined  ZOSTAVAX-declined  Labs completed today: CMP, A1C  Patient Concerns: H-pylori test.  Continued itchy eyes and ears.   Some noticeable changes in hearing.  Deferred to PCP for scheduled follow up visit.   Exercise Activities and Dietary recommendations Current Exercise Habits:: Home exercise routine, Type of exercise: Other - see comments (exercise bike ), Time (Minutes): 30, Frequency (Times/Week): 2, Weekly Exercise (Minutes/Week): 60, Intensity: Mild  Goals    . Increase physical activity     Currently uses the exercise bike x2 days a week for 30 minute sessions.  Add up to 1 day per week for 30 min sessions as tolerated.      . Increase water intake     Currently drinks 3-4 sips of water daily.  Increase water intake up to 3-4 bottle OR 8 cups daily.      Fall Risk Fall Risk  12/13/2014 09/25/2013 09/25/2013 09/14/2012  Falls in the past year? Yes No No No  Injury with Fall? No - - -   Depression Screen PHQ 2/9 Scores 12/13/2014 09/25/2013 09/25/2013 09/14/2012  PHQ - 2 Score 0 0 0 0     Cognitive Testing MMSE - Mini Mental State Exam 12/13/2014  Orientation to time 5  Orientation to Place 5  Registration 3  Attention/ Calculation 5  Recall 3  Language- name 2 objects 2  Language- repeat 1  Language- follow 3 step command 3  Language- read & follow direction 1  Write a sentence 1  Copy design 1  Total score 30    Immunization History  Administered Date(s) Administered  . Influenza Split 11/29/2011  . Influenza, High Dose Seasonal PF 12/13/2014  . Influenza,inj,Quad PF,36+ Mos 12/15/2012, 11/21/2013  . Pneumococcal Conjugate-13 03/23/2013  . Pneumococcal Polysaccharide-23 07/22/1998, 12/31/2011   Screening Tests Health Maintenance  Topic Date Due  . ZOSTAVAX  12/17/2015 (Originally 01/26/1997)  . TETANUS/TDAP  12/17/2015 (Originally 01/27/1956)  . URINE MICROALBUMIN  12/28/2014  . FOOT EXAM  06/13/2015  . HEMOGLOBIN A1C  06/13/2015  . OPHTHALMOLOGY EXAM  06/20/2015  . INFLUENZA VACCINE  09/16/2015  . DEXA SCAN  Completed  . PNA vac Low Risk Adult  Completed      Plan:    End of life planning was discussed; aging in home or other; plans to discuss with family and return copy of advanced directives.   During the course of the visit the patient was educated and counseled about the following appropriate screening and preventive services:   Vaccines to include Pneumoccal, Influenza, Hepatitis B, Td, Zostavax, HCV  Electrocardiogram  Cardiovascular Disease  Colorectal cancer screening  Bone density screening  Diabetes screening  Glaucoma  screening  Mammography/PAP  Nutrition counseling   Patient Instructions (the written plan) was given to the patient.   Varney Biles, LPN  [id number]

## 2014-12-13 NOTE — Patient Instructions (Addendum)
[name],  Thank you for taking time to come for your Medicare Wellness Visit.  I appreciate your ongoing commitment to your health goals. Please review the following plan we discussed and let me know if I can assist you in the future.  Fat and Cholesterol Restricted Diet High levels of fat and cholesterol in your blood may lead to various health problems, such as diseases of the heart, blood vessels, gallbladder, liver, and pancreas. Fats are concentrated sources of energy that come in various forms. Certain types of fat, including saturated fat, may be harmful in excess. Cholesterol is a substance needed by your body in small amounts. Your body makes all the cholesterol it needs. Excess cholesterol comes from the food you eat. When you have high levels of cholesterol and saturated fat in your blood, health problems can develop because the excess fat and cholesterol will gather along the walls of your blood vessels, causing them to narrow. Choosing the right foods will help you control your intake of fat and cholesterol. This will help keep the levels of these substances in your blood within normal limits and reduce your risk of disease. WHAT IS MY PLAN? Your health care provider recommends that you:  Get no more than __________ % of the total calories in your daily diet from fat.  Limit your intake of saturated fat to less than ______% of your total calories each day.  Limit the amount of cholesterol in your diet to less than _________mg per day. WHAT TYPES OF FAT SHOULD I CHOOSE?  Choose healthy fats more often. Choose monounsaturated and polyunsaturated fats, such as olive and canola oil, flaxseeds, walnuts, almonds, and seeds.  Eat more omega-3 fats. Good choices include salmon, mackerel, sardines, tuna, flaxseed oil, and ground flaxseeds. Aim to eat fish at least two times a week.  Limit saturated fats. Saturated fats are primarily found in animal products, such as meats, butter, and  cream. Plant sources of saturated fats include palm oil, palm kernel oil, and coconut oil.  Avoid foods with partially hydrogenated oils in them. These contain trans fats. Examples of foods that contain trans fats are stick margarine, some tub margarines, cookies, crackers, and other baked goods. WHAT GENERAL GUIDELINES DO I NEED TO FOLLOW? These guidelines for healthy eating will help you control your intake of fat and cholesterol:  Check food labels carefully to identify foods with trans fats or high amounts of saturated fat.  Fill one half of your plate with vegetables and green salads.  Fill one fourth of your plate with whole grains. Look for the word "whole" as the first word in the ingredient list.  Fill one fourth of your plate with lean protein foods.  Limit fruit to two servings a day. Choose fruit instead of juice.  Eat more foods that contain soluble fiber. Examples of foods that contain this type of fiber are apples, broccoli, carrots, beans, peas, and barley. Aim to get 20-30 g of fiber per day.  Eat more home-cooked food and less restaurant, buffet, and fast food.  Limit or avoid alcohol.  Limit foods high in starch and sugar.  Limit fried foods.  Cook foods using methods other than frying. Baking, boiling, grilling, and broiling are all great options.  Lose weight if you are overweight. Losing just 5-10% of your initial body weight can help your overall health and prevent diseases such as diabetes and heart disease. WHAT FOODS CAN I EAT? Grains Whole grains, such as whole wheat or  whole grain breads, crackers, cereals, and pasta. Unsweetened oatmeal, bulgur, barley, quinoa, or brown rice. Corn or whole wheat flour tortillas. Vegetables Fresh or frozen vegetables (raw, steamed, roasted, or grilled). Green salads. Fruits All fresh, canned (in natural juice), or frozen fruits. Meat and Other Protein Products Ground beef (85% or leaner), grass-fed beef, or beef  trimmed of fat. Skinless chicken or [place]. Ground chicken or [place]. Pork trimmed of fat. All fish and seafood. Eggs. Dried beans, peas, or lentils. Unsalted nuts or seeds. Unsalted canned or dry beans. Dairy Low-fat dairy products, such as skim or 1% milk, 2% or reduced-fat cheeses, low-fat ricotta or cottage cheese, or plain low-fat yogurt. Fats and Oils Tub margarines without trans fats. Light or reduced-fat mayonnaise and salad dressings. Avocado. Olive, canola, sesame, or safflower oils. Natural peanut or almond butter (choose ones without added sugar and oil). The items listed above may not be a complete list of recommended foods or beverages. Contact your dietitian for more options. WHAT FOODS ARE NOT RECOMMENDED? Grains White bread. White pasta. White rice. Cornbread. Bagels, pastries, and croissants. Crackers that contain trans fat. Vegetables White potatoes. Corn. Creamed or fried vegetables. Vegetables in a cheese sauce. Fruits Dried fruits. Canned fruit in light or heavy syrup. Fruit juice. Meat and Other Protein Products Fatty cuts of meat. Ribs, chicken wings, bacon, sausage, bologna, salami, chitterlings, fatback, hot dogs, bratwurst, and packaged luncheon meats. Liver and organ meats. Dairy Whole or 2% milk, cream, half-and-half, and cream cheese. Whole milk cheeses. Whole-fat or sweetened yogurt. Full-fat cheeses. Nondairy creamers and whipped toppings. Processed cheese, cheese spreads, or cheese curds. Sweets and Desserts Corn syrup, sugars, honey, and molasses. Candy. Jam and jelly. Syrup. Sweetened cereals. Cookies, pies, cakes, donuts, muffins, and ice cream. Fats and Oils Butter, stick margarine, lard, shortening, ghee, or bacon fat. Coconut, palm kernel, or palm oils. Beverages Alcohol. Sweetened drinks (such as sodas, lemonade, and fruit drinks or punches). The items listed above may not be a complete list of foods and beverages to avoid. Contact your dietitian for  more information.   This information is not intended to replace advice given to you by your health care provider. Make sure you discuss any questions you have with your health care provider.   Document Released: 02/01/2005 Document Revised: 02/22/2014 Document Reviewed: 05/02/2013 Elsevier Interactive Patient Education 2016 [place] in the Home  Falls can cause injuries. They can happen to people of all ages. There are many things you can do to make your home safe and to help prevent falls.  WHAT CAN I DO ON THE OUTSIDE OF MY HOME?  Regularly fix the edges of walkways and driveways and fix any cracks.  Remove anything that might make you trip as you walk through a door, such as a raised step or threshold.  Trim any bushes or trees on the path to your home.  Use bright outdoor lighting.  Clear any walking paths of anything that might make someone trip, such as rocks or tools.  Regularly check to see if handrails are loose or broken. Make sure that both sides of any steps have handrails.  Any raised decks and porches should have guardrails on the edges.  Have any leaves, snow, or ice cleared regularly.  Use sand or salt on walking paths during winter.  Clean up any spills in your garage right away. This includes oil or grease spills. WHAT CAN I DO IN THE BATHROOM?   Use night lights.  Install grab bars by the toilet and in the tub and shower. Do not use towel bars as grab bars.  Use non-skid mats or decals in the tub or shower.  If you need to sit down in the shower, use a plastic, non-slip stool.  Keep the floor dry. Clean up any water that spills on the floor as soon as it happens.  Remove soap buildup in the tub or shower regularly.  Attach bath mats securely with double-sided non-slip rug tape.  Do not have throw rugs and other things on the floor that can make you trip. WHAT CAN I DO IN THE BEDROOM?  Use night lights.  Make sure that you  have a light by your bed that is easy to reach.  Do not use any sheets or blankets that are too big for your bed. They should not hang down onto the floor.  Have a firm chair that has side arms. You can use this for support while you get dressed.  Do not have throw rugs and other things on the floor that can make you trip. WHAT CAN I DO IN THE KITCHEN?  Clean up any spills right away.  Avoid walking on wet floors.  Keep items that you use a lot in easy-to-reach places.  If you need to reach something above you, use a strong step stool that has a grab bar.  Keep electrical cords out of the way.  Do not use floor polish or wax that makes floors slippery. If you must use wax, use non-skid floor wax.  Do not have throw rugs and other things on the floor that can make you trip. WHAT CAN I DO WITH MY STAIRS?  Do not leave any items on the stairs.  Make sure that there are handrails on both sides of the stairs and use them. Fix handrails that are broken or loose. Make sure that handrails are as long as the stairways.  Check any carpeting to make sure that it is firmly attached to the stairs. Fix any carpet that is loose or worn.  Avoid having throw rugs at the top or bottom of the stairs. If you do have throw rugs, attach them to the floor with carpet tape.  Make sure that you have a light switch at the top of the stairs and the bottom of the stairs. If you do not have them, ask someone to add them for you. WHAT ELSE CAN I DO TO HELP PREVENT FALLS?  Wear shoes that:  Do not have high heels.  Have rubber bottoms.  Are comfortable and fit you well.  Are closed at the toe. Do not wear sandals.  If you use a stepladder:  Make sure that it is fully opened. Do not climb a closed stepladder.  Make sure that both sides of the stepladder are locked into place.  Ask someone to hold it for you, if possible.  Clearly mark and make sure that you can see:  Any grab bars or  handrails.  First and last steps.  Where the edge of each step is.  Use tools that help you move around (mobility aids) if they are needed. These include:  Canes.  Walkers.  Scooters.  Crutches.  Turn on the lights when you go into a dark area. Replace any light bulbs as soon as they burn out.  Set up your furniture so you have a clear path. Avoid moving your furniture around.  If any of your floors are  uneven, fix them.  If there are any pets around you, be aware of where they are.  Review your medicines with your doctor. Some medicines can make you feel dizzy. This can increase your chance of falling. Ask your doctor what other things that you can do to help prevent falls.   This information is not intended to replace advice given to you by your health care provider. Make sure you discuss any questions you have with your health care provider.   Document Released: 11/28/2008 Document Revised: 06/18/2014 Document Reviewed: 03/08/2014 Elsevier Interactive Patient Education 2016 [employer]  Glaucoma Glaucoma happens when the fluid pressure in the eyeball is too high. If the pressure stays high for too long, the eye may become damaged. This can cause a loss of vision. The most common type of glaucoma causes pressure in the eye to go up slowly. There may be no symptoms at first. Testing for this condition can help to find the condition before damage occurs. Early treatment can often stop vision loss. HOME CARE  Take medicines only as told by your doctor.  Use your eye drops exactly as told. You will probably need to use these for the rest of your life.  Exercise often. Talk with your doctor about which types of exercise are safe for you. Avoid standing on your head.  Keep all follow-up visits as told by your doctor. This is important. GET HELP IF:  Your symptoms get worse. GET HELP RIGHT AWAY IF:  You have bad pain in your eye.  You have vision problems.  You have  a bad headache in the area around your eye.  You feel sick to your stomach (nauseous) or you throw up (vomit).  You start to have problems with your other eye.   This information is not intended to replace advice given to you by your health care provider. Make sure you discuss any questions you have with your health care provider.   Document Released: 11/11/2007 Document Revised: 02/22/2014 Document Reviewed: 11/13/2013 Elsevier Interactive Patient Education 2016 [employer].  Hearing Loss Hearing loss is a partial or total loss of the ability to hear. This can be temporary or permanent, and it can happen in one or both ears. Hearing loss may be referred to as deafness. Medical care is necessary to treat hearing loss properly and to prevent the condition from getting worse. Your hearing may partially or completely come back, depending on what caused your hearing loss and how severe it is. In some cases, hearing loss is permanent. CAUSES Common causes of hearing loss include:   Too much wax in the ear canal.   Infection of the ear canal or middle ear.   Fluid in the middle ear.   Injury to the ear or surrounding area.   An object stuck in the ear.   Prolonged exposure to loud sounds, such as music.  Less common causes of hearing loss include:   Tumors in the ear.   Viral or bacterial infections, such as meningitis.   A hole in the eardrum (perforated eardrum).  Problems with the hearing nerve that sends signals between the brain and the ear.  Certain medicines.  SYMPTOMS  Symptoms of this condition may include:  Difficulty telling the difference between sounds.  Difficulty following a conversation when there is background noise.  Lack of response to sounds in your environment. This may be most noticeable when you do not respond to startling sounds.  Needing to turn up  the volume on the television, radio, etc.  Ringing in the ears.  Dizziness.  Pain in  the ears. DIAGNOSIS This condition is diagnosed based on a physical exam and a hearing test (audiometry). The audiometry test will be performed by a hearing specialist (audiologist). You may also be referred to an ear, nose, and throat (ENT) specialist (otolaryngologist).  TREATMENT Treatment for recent onset of hearing loss may include:   Ear wax removal.   Being prescribed medicines to prevent infection (antibiotics).   Being prescribed medicines to reduce inflammation (corticosteroids).  HOME CARE INSTRUCTIONS  If you were prescribed an antibiotic medicine, take it as told by your health care provider. Do not stop taking the antibiotic even if you start to feel better.  Take over-the-counter and prescription medicines only as told by your health care provider.  Avoid loud noises.   Return to your normal activities as told by your health care provider. Ask your health care provider what activities are safe for you.  Keep all follow-up visits as told by your health care provider. This is important. SEEK MEDICAL CARE IF:   You feel dizzy.   You develop new symptoms.   You vomit or feel nauseous.   You have a fever.  SEEK IMMEDIATE MEDICAL CARE IF:  You develop sudden changes in your vision.   You have severe ear pain.   You have new or increased weakness.  You have a severe headache.   This information is not intended to replace advice given to you by your health care provider. Make sure you discuss any questions you have with your health care provider.   Document Released: 02/01/2005 Document Revised: 10/23/2014 Document Reviewed: 06/19/2014 Elsevier Interactive Patient Education [employer].

## 2014-12-17 ENCOUNTER — Other Ambulatory Visit: Payer: Self-pay | Admitting: Internal Medicine

## 2014-12-17 NOTE — Telephone Encounter (Signed)
Pt has not been seen in the last 6 months is it okay to refill for 90 days with no refills due to it going to [facility]?

## 2014-12-20 ENCOUNTER — Ambulatory Visit: Payer: Medicare Other | Admitting: Internal Medicine

## 2014-12-26 ENCOUNTER — Ambulatory Visit (INDEPENDENT_AMBULATORY_CARE_PROVIDER_SITE_OTHER): Payer: Medicare Other | Admitting: Internal Medicine

## 2014-12-26 ENCOUNTER — Ambulatory Visit (INDEPENDENT_AMBULATORY_CARE_PROVIDER_SITE_OTHER)
Admission: RE | Admit: 2014-12-26 | Discharge: 2014-12-26 | Disposition: A | Discharge status: Home / Self Care | Payer: Medicare Other | Source: Ambulatory Visit | Attending: Internal Medicine | Admitting: Internal Medicine

## 2014-12-26 ENCOUNTER — Other Ambulatory Visit: Payer: Self-pay

## 2014-12-26 ENCOUNTER — Telehealth: Payer: Self-pay | Admitting: *Deleted

## 2014-12-26 ENCOUNTER — Encounter: Payer: Self-pay | Admitting: Internal Medicine

## 2014-12-26 VITALS — BP 135/68 | HR 59 | Temp 98.6°F | Ht 63.0 in | Wt 172.1 lb

## 2014-12-26 DIAGNOSIS — R059 Cough, unspecified: Secondary | ICD-10-CM

## 2014-12-26 DIAGNOSIS — Z1239 Encounter for other screening for malignant neoplasm of breast: Secondary | ICD-10-CM

## 2014-12-26 DIAGNOSIS — R05 Cough: Secondary | ICD-10-CM

## 2014-12-26 MED ORDER — DOXYCYCLINE HYCLATE 100 MG PO TABS: 100.0000 mg | ORAL_TABLET | Freq: Two times a day (BID) | ORAL | Status: DC

## 2014-12-26 MED ORDER — HYDROCOD POLST-CPM POLST ER 10-8 MG/5ML PO SUER: 5.0000 mL | Freq: Two times a day (BID) | ORAL | Status: DC | PRN

## 2014-12-26 NOTE — Progress Notes (Signed)
Subjective:    Patient ID: [name], female    DOB: 1934/02/24, 80 y.o.   MRN: [id number]  HPI  80YO female presents for acute visit.  Cough - Monday had sore throat. This has resolved. No fever noted. Cough with chest tightness starting Monday. Some dyspnea. Coughing up some mucous.  Unable to sleep because of cough.  Wt Readings from Last 3 Encounters:  12/26/14 172 lb 2 oz (78.075 kg)  12/13/14 173 lb 12.8 oz (78.835 kg)  06/13/14 174 lb 8 oz (79.153 kg)   BP Readings from Last 3 Encounters:  12/26/14 135/68  12/13/14 150/88  06/13/14 133/82    Past Medical History  Diagnosis Date  . Arthritis   . Chicken pox   . Generalized headaches   . Glaucoma   . Allergy     hay fever  . Colon polyp   . Urinary incontinence   . UTI (urinary tract infection)   . Diabetes mellitus 2008  . Hypertension   . Thyroid disease     hypothyroidism   Family History  Problem Relation Age of Onset  . Heart disease Mother   . Asthma Mother   . Heart attack Mother 2  . Heart disease Father   . Deep vein thrombosis Father   . Leukemia Brother   . Cancer Brother   . Arthritis Other   . Diabetes Other   . Diabetes Sister   . Cancer Maternal Aunt     ovarian?  . Crohn's disease Brother   . Diabetes Brother   . Cancer Maternal Grandfather     colon   Past Surgical History  Procedure Laterality Date  . Appendectomy  1998  . Cholecystectomy    . Tonsillectomy    . Bunionectomy    . Eye surgery      R  eye-lid drop surgery   Social History   Social History  . Marital Status: Married    Spouse Name: N/A  . Number of Children: N/A  . Years of Education: N/A   Social History Main Topics  . Smoking status: Never Smoker   . Smokeless tobacco: None  . Alcohol Use: Yes     Comment: occasional  . Drug Use: No  . Sexual Activity: Yes   Other Topics Concern  . None   Social History Narrative   Lives in [place], from [place]. Daughter lives here. 4 daughters.      Work  - retired Data processing manager   Diet - regular   Exercise - bike daily at home    Review of Systems  Constitutional: Positive for fatigue. Negative for fever, chills and unexpected weight change.  HENT: Positive for congestion and sore throat. Negative for ear discharge, ear pain, facial swelling, hearing loss, mouth sores, nosebleeds, postnasal drip, rhinorrhea, sinus pressure, sneezing, tinnitus, trouble swallowing and voice change.   Eyes: Negative for pain, discharge, redness and visual disturbance.  Respiratory: Positive for cough, chest tightness and shortness of breath. Negative for wheezing and stridor.   Cardiovascular: Negative for chest pain, palpitations and leg swelling.  Musculoskeletal: Negative for myalgias, arthralgias, neck pain and neck stiffness.  Skin: Negative for color change and rash.  Neurological: Negative for dizziness, weakness, light-headedness and headaches.  Hematological: Negative for adenopathy.  Psychiatric/Behavioral: Positive for sleep disturbance.       Objective:    BP 135/68 mmHg  Pulse 59  Temp(Src) 98.6 F (37 C) (Oral)  Ht 5\' 3"  (1.6 m)  Wt  172 lb 2 oz (78.075 kg)  BMI 30.50 kg/m2  SpO2 97% Physical Exam  Constitutional: She is oriented to person, place, and time. She appears well-developed and well-nourished. No distress.  HENT:  Head: Normocephalic and atraumatic.  Right Ear: External ear normal.  Left Ear: External ear normal.  Nose: Nose normal.  Mouth/Throat: Oropharynx is clear and moist. No oropharyngeal exudate.  Eyes: Conjunctivae are normal. Pupils are equal, round, and reactive to light. Right eye exhibits no discharge. Left eye exhibits no discharge. No scleral icterus.  Neck: Normal range of motion. Neck supple. No tracheal deviation present. No thyromegaly present.  Cardiovascular: Normal rate, regular rhythm, normal heart sounds and intact distal pulses.  Exam reveals no gallop and no friction rub.   No murmur  heard. Pulmonary/Chest: Effort normal. No accessory muscle usage. No tachypnea. No respiratory distress. She has no decreased breath sounds. She has no wheezes. She has rhonchi in the right lower field and the left lower field. She has no rales. She exhibits no tenderness.  Musculoskeletal: Normal range of motion. She exhibits no edema or tenderness.  Lymphadenopathy:    She has no cervical adenopathy.  Neurological: She is alert and oriented to person, place, and time. No cranial nerve deficit. She exhibits normal muscle tone. Coordination normal.  Skin: Skin is warm and dry. No rash noted. She is not diaphoretic. No erythema. No pallor.  Psychiatric: She has a normal mood and affect. Her behavior is normal. Judgment and thought content normal.          Assessment & Plan:   Problem List Items Addressed This Visit      Unprioritized   Cough - Primary    Cough x almost 1 week with purulent sputum and rhonchi on exam. Will get CXR. Start Doxycycline and Tussionex as needed for cough. Follow up in 1 week and sooner as needed if symptoms are not improving.      Relevant Medications   doxycycline (VIBRA-TABS) 100 MG tablet   chlorpheniramine-HYDROcodone (TUSSIONEX PENNKINETIC ER) 10-8 MG/5ML SUER   Other Relevant Orders   DG Chest 2 View   Screening for breast cancer   Relevant Orders   MM Digital Screening       Return in about 1 week (around 01/02/2015) for Physical.

## 2014-12-26 NOTE — Telephone Encounter (Signed)
See below and please call patient.  Thanks

## 2014-12-26 NOTE — Telephone Encounter (Signed)
1pm on 11/17

## 2014-12-26 NOTE — Telephone Encounter (Signed)
Please advise if you can work her in

## 2014-12-26 NOTE — Telephone Encounter (Signed)
Pt has been notified of appt time scheduled.

## 2014-12-26 NOTE — Telephone Encounter (Signed)
Patient was advised to return for her physical 01/02/15 for her physical. Patient requested to be scheduled on this day. Please advise.

## 2014-12-26 NOTE — Progress Notes (Signed)
Pre visit review using our clinic review tool, if applicable. No additional management support is needed unless otherwise documented below in the visit note. 

## 2014-12-26 NOTE — Assessment & Plan Note (Signed)
Cough x almost 1 week with purulent sputum and rhonchi on exam. Will get CXR. Start Doxycycline and Tussionex as needed for cough. Follow up in 1 week and sooner as needed if symptoms are not improving.

## 2014-12-26 NOTE — Patient Instructions (Signed)
Start Doxycycline 100mg  twice daily.  Start Tussionex as needed for cough.  Chest xray today at [facility].

## 2015-01-01 ENCOUNTER — Other Ambulatory Visit: Payer: Self-pay

## 2015-01-02 ENCOUNTER — Ambulatory Visit (INDEPENDENT_AMBULATORY_CARE_PROVIDER_SITE_OTHER): Payer: Medicare Other | Admitting: Internal Medicine

## 2015-01-02 ENCOUNTER — Encounter: Payer: Self-pay | Admitting: Internal Medicine

## 2015-01-02 VITALS — BP 135/79 | HR 56 | Temp 97.4°F | Ht 63.25 in | Wt 173.4 lb

## 2015-01-02 DIAGNOSIS — R05 Cough: Secondary | ICD-10-CM

## 2015-01-02 DIAGNOSIS — I10 Essential (primary) hypertension: Secondary | ICD-10-CM | POA: Diagnosis not present

## 2015-01-02 DIAGNOSIS — Z0000 Encounter for general adult medical examination without abnormal findings: Secondary | ICD-10-CM | POA: Insufficient documentation

## 2015-01-02 DIAGNOSIS — R0683 Snoring: Secondary | ICD-10-CM | POA: Diagnosis not present

## 2015-01-02 DIAGNOSIS — R059 Cough, unspecified: Secondary | ICD-10-CM

## 2015-01-02 DIAGNOSIS — Z0001 Encounter for general adult medical examination with abnormal findings: Secondary | ICD-10-CM | POA: Insufficient documentation

## 2015-01-02 MED ORDER — FINGERSTIX LANCETS MISC: Status: DC

## 2015-01-02 MED ORDER — GLUCOSE BLOOD VI STRP: ORAL_STRIP | Status: DC

## 2015-01-02 NOTE — Progress Notes (Signed)
Subjective:    Patient ID: [name], female    DOB: 1934/08/29, 80 y.o.   MRN: [id number]  HPI  80YO female presents for physical exam.  Seen last week for bronchitis. Not feeling well. More nauseous while on antibiotic and cough suppressant. Continues to have some cough. Completes antibiotics on Sunday. No fever, no dyspnea.  Concerned about elevated BP at home 140-150s/90s. Occasionally wakes in the night with headaches. Also notes some snoring at night. No apnea. Unsure how long this has been going on. Has never been evaluated for sleep apnea. No CP, HA, palpitations.  Wt Readings from Last 3 Encounters:  01/02/15 173 lb 6 oz (78.642 kg)  12/26/14 172 lb 2 oz (78.075 kg)  12/13/14 173 lb 12.8 oz (78.835 kg)   BP Readings from Last 3 Encounters:  01/02/15 135/79  12/26/14 135/68  12/13/14 150/88    Past Medical History  Diagnosis Date  . Arthritis   . Chicken pox   . Generalized headaches   . Glaucoma   . Allergy     hay fever  . Colon polyp   . Urinary incontinence   . UTI (urinary tract infection)   . Diabetes mellitus 2008  . Hypertension   . Thyroid disease     hypothyroidism   Family History  Problem Relation Age of Onset  . Heart disease Mother   . Asthma Mother   . Heart attack Mother 63  . Heart disease Father   . Deep vein thrombosis Father   . Leukemia Brother   . Cancer Brother   . Arthritis Other   . Diabetes Other   . Diabetes Sister   . Cancer Maternal Aunt     ovarian?  . Crohn's disease Brother   . Diabetes Brother   . Cancer Maternal Grandfather     colon   Past Surgical History  Procedure Laterality Date  . Appendectomy  1998  . Cholecystectomy    . Tonsillectomy    . Bunionectomy    . Eye surgery      R  eye-lid drop surgery   Social History   Social History  . Marital Status: Married    Spouse Name: N/A  . Number of Children: N/A  . Years of Education: N/A   Social History Main Topics  . Smoking status: Never  Smoker   . Smokeless tobacco: None  . Alcohol Use: Yes     Comment: occasional  . Drug Use: No  . Sexual Activity: Yes   Other Topics Concern  . None   Social History Narrative   Lives in [place], from [place]. Daughter lives here. 4 daughters.      Work - retired Data processing manager   Diet - regular   Exercise - bike daily at home    Review of Systems  Constitutional: Negative for fever, chills, appetite change, fatigue and unexpected weight change.  Eyes: Negative for visual disturbance.  Respiratory: Positive for cough. Negative for chest tightness, shortness of breath and wheezing.   Cardiovascular: Negative for chest pain and leg swelling.  Gastrointestinal: Negative for nausea, vomiting, abdominal pain, diarrhea and constipation.  Skin: Negative for color change and rash.  Neurological: Positive for headaches. Negative for weakness.  Hematological: Negative for adenopathy. Does not bruise/bleed easily.  Psychiatric/Behavioral: Positive for sleep disturbance. Negative for dysphoric mood. The patient is not nervous/anxious.        Objective:    BP 135/79 mmHg  Pulse 56  Temp(Src) 97.4  F (36.3 C) (Oral)  Ht 5' 3.25" (1.607 m)  Wt 173 lb 6 oz (78.642 kg)  BMI 30.45 kg/m2  SpO2 98% Physical Exam  Constitutional: She is oriented to person, place, and time. She appears well-developed and well-nourished. No distress.  HENT:  Head: Normocephalic and atraumatic.  Right Ear: External ear normal.  Left Ear: External ear normal.  Nose: Nose normal.  Mouth/Throat: Oropharynx is clear and moist. No oropharyngeal exudate.  Eyes: Conjunctivae are normal. Pupils are equal, round, and reactive to light. Right eye exhibits no discharge. Left eye exhibits no discharge. No scleral icterus.  Neck: Normal range of motion. Neck supple. No tracheal deviation present. No thyromegaly present.  Cardiovascular: Normal rate, regular rhythm, normal heart sounds and intact distal pulses.  Exam reveals no  gallop and no friction rub.   No murmur heard. Pulmonary/Chest: Effort normal and breath sounds normal. No accessory muscle usage. No tachypnea. No respiratory distress. She has no decreased breath sounds. She has no wheezes. She has no rales. She exhibits no tenderness. Right breast exhibits no inverted nipple, no mass, no nipple discharge, no skin change and no tenderness. Left breast exhibits no inverted nipple, no mass, no nipple discharge, no skin change and no tenderness. Breasts are symmetrical.  Abdominal: Soft. Bowel sounds are normal. She exhibits no distension and no mass. There is no tenderness. There is no rebound and no guarding.  Musculoskeletal: Normal range of motion. She exhibits no edema or tenderness.  Lymphadenopathy:    She has no cervical adenopathy.  Neurological: She is alert and oriented to person, place, and time. No cranial nerve deficit. She exhibits normal muscle tone. Coordination normal.  Skin: Skin is warm and dry. No rash noted. She is not diaphoretic. No erythema. No pallor.  Psychiatric: She has a normal mood and affect. Her behavior is normal. Judgment and thought content normal.          Assessment & Plan:   Problem List Items Addressed This Visit      High   Hypertension    BP Readings from Last 3 Encounters:  01/02/15 135/79  12/26/14 135/68  12/13/14 150/88   BP has been generally well controlled in the office, however more elevated at home. Will have pt bring home BP cuff to compare to our meter. Follow up in 2 weeks. If BP continues to be elevated, increase Losartan to 100mg .        Unprioritized   Cough    Symptoms of cough gradually improving. Exam normal. Will continue to monitor.      Routine general medical examination at a health care facility - Primary    General medical exam including breast exam normal today. PAP and pelvic deferred given age and last PAP 2014 normal, HPV neg. Mammogram scheduled. Colonoscopy UTD. Labs reviewed.  Immunizations UTD.      Snoring    Pt notes snoring during sleep, waking with HA and elevated BP readings. Suspect OSA. Discussed referral for sleep study. She would like to hold off on this for now. Follow up in 2 weeks andprn.          Return in about 2 weeks (around 01/16/2015) for Recheck.

## 2015-01-02 NOTE — Assessment & Plan Note (Signed)
Symptoms of cough gradually improving. Exam normal. Will continue to monitor.

## 2015-01-02 NOTE — Assessment & Plan Note (Signed)
General medical exam including breast exam normal today. PAP and pelvic deferred given age and last PAP 2014 normal, HPV neg. Mammogram scheduled. Colonoscopy UTD. Labs reviewed. Immunizations UTD.

## 2015-01-02 NOTE — Assessment & Plan Note (Signed)
BP Readings from Last 3 Encounters:  01/02/15 135/79  12/26/14 135/68  12/13/14 150/88   BP has been generally well controlled in the office, however more elevated at home. Will have pt bring home BP cuff to compare to our meter. Follow up in 2 weeks. If BP continues to be elevated, increase Losartan to 100mg .

## 2015-01-02 NOTE — Patient Instructions (Signed)
Health Maintenance, Female Adopting a healthy lifestyle and getting preventive care can go a long way to promote health and wellness. Talk with your health care provider about what schedule of regular examinations is right for you. This is a good chance for you to check in with your provider about disease prevention and staying healthy. In between checkups, there are plenty of things you can do on your own. Experts have done a lot of research about which lifestyle changes and preventive measures are most likely to keep you healthy. Ask your health care provider for more information. WEIGHT AND DIET  Eat a healthy diet  Be sure to include plenty of vegetables, fruits, low-fat dairy products, and lean protein.  Do not eat a lot of foods high in solid fats, added sugars, or salt.  Get regular exercise. This is one of the most important things you can do for your health.  Most adults should exercise for at least 150 minutes each week. The exercise should increase your heart rate and make you sweat (moderate-intensity exercise).  Most adults should also do strengthening exercises at least twice a week. This is in addition to the moderate-intensity exercise.  Maintain a healthy weight  Body mass index (BMI) is a measurement that can be used to identify possible weight problems. It estimates body fat based on height and weight. Your health care provider can help determine your BMI and help you achieve or maintain a healthy weight.  For females 20 years of age and older:   A BMI below 18.5 is considered underweight.  A BMI of 18.5 to 24.9 is normal.  A BMI of 25 to 29.9 is considered overweight.  A BMI of 30 and above is considered obese.  Watch levels of cholesterol and blood lipids  You should start having your blood tested for lipids and cholesterol at 78 years of age, then have this test every 5 years.  You may need to have your cholesterol levels checked more often if:  Your lipid  or cholesterol levels are high.  You are older than 78 years of age.  You are at high risk for heart disease.  CANCER SCREENING   Lung Cancer  Lung cancer screening is recommended for adults 55-80 years old who are at high risk for lung cancer because of a history of smoking.  A yearly low-dose CT scan of the lungs is recommended for people who:  Currently smoke.  Have quit within the past 15 years.  Have at least a 30-pack-year history of smoking. A pack year is smoking an average of one pack of cigarettes a day for 1 year.  Yearly screening should continue until it has been 15 years since you quit.  Yearly screening should stop if you develop a health problem that would prevent you from having lung cancer treatment.  Breast Cancer  Practice breast self-awareness. This means understanding how your breasts normally appear and feel.  It also means doing regular breast self-exams. Let your health care provider know about any changes, no matter how small.  If you are in your 20s or 30s, you should have a clinical breast exam (CBE) by a health care provider every 1-3 years as part of a regular health exam.  If you are 40 or older, have a CBE every year. Also consider having a breast X-ray (mammogram) every year.  If you have a family history of breast cancer, talk to your health care provider about genetic screening.  If you   are at high risk for breast cancer, talk to your health care provider about having an MRI and a mammogram every year.  Breast cancer gene (BRCA) assessment is recommended for women who have family members with BRCA-related cancers. BRCA-related cancers include:  Breast.  Ovarian.  Tubal.  Peritoneal cancers.  Results of the assessment will determine the need for genetic counseling and BRCA1 and BRCA2 testing. Cervical Cancer Your health care provider may recommend that you be screened regularly for cancer of the pelvic organs (ovaries, uterus, and  vagina). This screening involves a pelvic examination, including checking for microscopic changes to the surface of your cervix (Pap test). You may be encouraged to have this screening done every 3 years, beginning at age 21.  For women ages 30-65, health care providers may recommend pelvic exams and Pap testing every 3 years, or they may recommend the Pap and pelvic exam, combined with testing for human papilloma virus (HPV), every 5 years. Some types of HPV increase your risk of cervical cancer. Testing for HPV may also be done on women of any age with unclear Pap test results.  Other health care providers may not recommend any screening for nonpregnant women who are considered low risk for pelvic cancer and who do not have symptoms. Ask your health care provider if a screening pelvic exam is right for you.  If you have had past treatment for cervical cancer or a condition that could lead to cancer, you need Pap tests and screening for cancer for at least 20 years after your treatment. If Pap tests have been discontinued, your risk factors (such as having a new sexual partner) need to be reassessed to determine if screening should resume. Some women have medical problems that increase the chance of getting cervical cancer. In these cases, your health care provider may recommend more frequent screening and Pap tests. Colorectal Cancer  This type of cancer can be detected and often prevented.  Routine colorectal cancer screening usually begins at 78 years of age and continues through 78 years of age.  Your health care provider may recommend screening at an earlier age if you have risk factors for colon cancer.  Your health care provider may also recommend using home test kits to check for hidden blood in the stool.  A small camera at the end of a tube can be used to examine your colon directly (sigmoidoscopy or colonoscopy). This is done to check for the earliest forms of colorectal  cancer.  Routine screening usually begins at age 50.  Direct examination of the colon should be repeated every 5-10 years through 78 years of age. However, you may need to be screened more often if early forms of precancerous polyps or small growths are found. Skin Cancer  Check your skin from head to toe regularly.  Tell your health care provider about any new moles or changes in moles, especially if there is a change in a mole's shape or color.  Also tell your health care provider if you have a mole that is larger than the size of a pencil eraser.  Always use sunscreen. Apply sunscreen liberally and repeatedly throughout the day.  Protect yourself by wearing long sleeves, pants, a wide-brimmed hat, and sunglasses whenever you are outside. HEART DISEASE, DIABETES, AND HIGH BLOOD PRESSURE   High blood pressure causes heart disease and increases the risk of stroke. High blood pressure is more likely to develop in:  People who have blood pressure in the high end   of the normal range (130-139/85-89 mm Hg).  People who are overweight or obese.  People who are African American.  If you are 38-23 years of age, have your blood pressure checked every 3-5 years. If you are 61 years of age or older, have your blood pressure checked every year. You should have your blood pressure measured twice--once when you are at a hospital or clinic, and once when you are not at a hospital or clinic. Record the average of the two measurements. To check your blood pressure when you are not at a hospital or clinic, you can use:  An automated blood pressure machine at a pharmacy.  A home blood pressure monitor.  If you are between 45 years and 39 years old, ask your health care provider if you should take aspirin to prevent strokes.  Have regular diabetes screenings. This involves taking a blood sample to check your fasting blood sugar level.  If you are at a normal weight and have a low risk for diabetes,  have this test once every three years after 78 years of age.  If you are overweight and have a high risk for diabetes, consider being tested at a younger age or more often. PREVENTING INFECTION  Hepatitis B  If you have a higher risk for hepatitis B, you should be screened for this virus. You are considered at high risk for hepatitis B if:  You were born in a country where hepatitis B is common. Ask your health care provider which countries are considered high risk.  Your parents were born in a high-risk country, and you have not been immunized against hepatitis B (hepatitis B vaccine).  You have HIV or AIDS.  You use needles to inject street drugs.  You live with someone who has hepatitis B.  You have had sex with someone who has hepatitis B.  You get hemodialysis treatment.  You take certain medicines for conditions, including cancer, organ transplantation, and autoimmune conditions. Hepatitis C  Blood testing is recommended for:  Everyone born from [birth year] through 1963.  Anyone with known risk factors for hepatitis C. Sexually transmitted infections (STIs)  You should be screened for sexually transmitted infections (STIs) including gonorrhea and chlamydia if:  You are sexually active and are younger than 78 years of age.  You are older than 78 years of age and your health care provider tells you that you are at risk for this type of infection.  Your sexual activity has changed since you were last screened and you are at an increased risk for chlamydia or gonorrhea. Ask your health care provider if you are at risk.  If you do not have HIV, but are at risk, it may be recommended that you take a prescription medicine daily to prevent HIV infection. This is called pre-exposure prophylaxis (PrEP). You are considered at risk if:  You are sexually active and do not regularly use condoms or know the HIV status of your partner(s).  You take drugs by injection.  You are sexually  active with a partner who has HIV. Talk with your health care provider about whether you are at high risk of being infected with HIV. If you choose to begin PrEP, you should first be tested for HIV. You should then be tested every 3 months for as long as you are taking PrEP.  PREGNANCY   If you are premenopausal and you may become pregnant, ask your health care provider about preconception counseling.  If you may  become pregnant, take 400 to 800 micrograms (mcg) of folic acid every day.  If you want to prevent pregnancy, talk to your health care provider about birth control (contraception). OSTEOPOROSIS AND MENOPAUSE   Osteoporosis is a disease in which the bones lose minerals and strength with aging. This can result in serious bone fractures. Your risk for osteoporosis can be identified using a bone density scan.  If you are 61 years of age or older, or if you are at risk for osteoporosis and fractures, ask your health care provider if you should be screened.  Ask your health care provider whether you should take a calcium or vitamin D supplement to lower your risk for osteoporosis.  Menopause may have certain physical symptoms and risks.  Hormone replacement therapy may reduce some of these symptoms and risks. Talk to your health care provider about whether hormone replacement therapy is right for you.  HOME CARE INSTRUCTIONS   Schedule regular health, dental, and eye exams.  Stay current with your immunizations.   Do not use any tobacco products including cigarettes, chewing tobacco, or electronic cigarettes.  If you are pregnant, do not drink alcohol.  If you are breastfeeding, limit how much and how often you drink alcohol.  Limit alcohol intake to no more than 1 drink per day for nonpregnant women. One drink equals 12 ounces of beer, 5 ounces of wine, or 1 ounces of hard liquor.  Do not use street drugs.  Do not share needles.  Ask your health care provider for help if  you need support or information about quitting drugs.  Tell your health care provider if you often feel depressed.  Tell your health care provider if you have ever been abused or do not feel safe at home.   This information is not intended to replace advice given to you by your health care provider. Make sure you discuss any questions you have with your health care provider.   Document Released: 08/17/2010 Document Revised: 02/22/2014 Document Reviewed: 01/03/2013 Elsevier Interactive Patient Education [employer].

## 2015-01-02 NOTE — Assessment & Plan Note (Signed)
Pt notes snoring during sleep, waking with HA and elevated BP readings. Suspect OSA. Discussed referral for sleep study. She would like to hold off on this for now. Follow up in 2 weeks andprn.

## 2015-01-03 ENCOUNTER — Other Ambulatory Visit: Payer: Self-pay | Admitting: *Deleted

## 2015-01-03 MED ORDER — LEVOTHYROXINE SODIUM 112 MCG PO TABS: ORAL_TABLET | ORAL | Status: DC

## 2015-01-07 ENCOUNTER — Ambulatory Visit: Payer: Medicare Other | Admitting: Internal Medicine

## 2015-01-17 ENCOUNTER — Ambulatory Visit (INDEPENDENT_AMBULATORY_CARE_PROVIDER_SITE_OTHER): Payer: Medicare Other | Admitting: Internal Medicine

## 2015-01-17 ENCOUNTER — Encounter: Payer: Self-pay | Admitting: Internal Medicine

## 2015-01-17 VITALS — BP 142/79 | HR 59 | Temp 97.8°F | Ht 63.0 in | Wt 171.8 lb

## 2015-01-17 DIAGNOSIS — R9389 Abnormal findings on diagnostic imaging of other specified body structures: Secondary | ICD-10-CM | POA: Insufficient documentation

## 2015-01-17 DIAGNOSIS — R938 Abnormal findings on diagnostic imaging of other specified body structures: Secondary | ICD-10-CM | POA: Diagnosis not present

## 2015-01-17 DIAGNOSIS — E039 Hypothyroidism, unspecified: Secondary | ICD-10-CM

## 2015-01-17 DIAGNOSIS — I10 Essential (primary) hypertension: Secondary | ICD-10-CM

## 2015-01-17 LAB — TSH: TSH: 2.01 u[IU]/mL (ref 0.35–4.50)

## 2015-01-17 NOTE — Patient Instructions (Signed)
We will set up cardiac ECHO.  Labs today.

## 2015-01-17 NOTE — Assessment & Plan Note (Signed)
CXR showed tortuosity of the descending aorta. We discussed that this is likely normal for her. Reviewed ECHO from 2009 which was normal. She has a family history of aortic aneurysm. Will plan to repeat ECHO for evaluation.

## 2015-01-17 NOTE — Progress Notes (Signed)
Pre visit review using our clinic review tool, if applicable. No additional management support is needed unless otherwise documented below in the visit note. 

## 2015-01-17 NOTE — Progress Notes (Signed)
Subjective:    Patient ID: [name], female    DOB: 07-21-1934, 80 y.o.   MRN: [id number]  HPI  80YO female presents for follow up.  Last seen 11/17 for physical exam. BP noted to be elevated.  HTN - BP at home mostly 110-140s/60-70s.. No chest pain, HA. Feels lightheaded at times. Compliant with medications.  Hypothyroidism - notes some thinning of hair. Concerned about thyroid function. Compliant with medication. Will check TSH with labs.  Abnormal CXR - She is concerned about risk of aortic aneurysm given her mother's history of aortic aneurysm and recent CXR report of tortuosity of the descending aorta. No CP, dyspnea.  Wt Readings from Last 3 Encounters:  01/17/15 171 lb 12 oz (77.905 kg)  01/02/15 173 lb 6 oz (78.642 kg)  12/26/14 172 lb 2 oz (78.075 kg)   BP Readings from Last 3 Encounters:  01/17/15 142/79  01/02/15 135/79  12/26/14 135/68    Past Medical History  Diagnosis Date  . Arthritis   . Chicken pox   . Generalized headaches   . Glaucoma   . Allergy     hay fever  . Colon polyp   . Urinary incontinence   . UTI (urinary tract infection)   . Diabetes mellitus 2008  . Hypertension   . Thyroid disease     hypothyroidism   Family History  Problem Relation Age of Onset  . Heart disease Mother   . Asthma Mother   . Heart attack Mother 71  . Heart disease Father   . Deep vein thrombosis Father   . Leukemia Brother   . Cancer Brother   . Arthritis Other   . Diabetes Other   . Diabetes Sister   . Cancer Maternal Aunt     ovarian?  . Crohn's disease Brother   . Diabetes Brother   . Cancer Maternal Grandfather     colon   Past Surgical History  Procedure Laterality Date  . Appendectomy  1998  . Cholecystectomy    . Tonsillectomy    . Bunionectomy    . Eye surgery      R  eye-lid drop surgery   Social History   Social History  . Marital Status: Married    Spouse Name: N/A  . Number of Children: N/A  . Years of Education: N/A    Social History Main Topics  . Smoking status: Never Smoker   . Smokeless tobacco: None  . Alcohol Use: Yes     Comment: occasional  . Drug Use: No  . Sexual Activity: Yes   Other Topics Concern  . None   Social History Narrative   Lives in [place], from [place]. Daughter lives here. 4 daughters.      Work - retired Data processing manager   Diet - regular   Exercise - bike daily at home    Review of Systems  Constitutional: Negative for fever, chills, appetite change, fatigue and unexpected weight change.  Eyes: Negative for visual disturbance.  Respiratory: Negative for cough and shortness of breath.   Cardiovascular: Negative for chest pain and leg swelling.  Gastrointestinal: Negative for nausea, vomiting, abdominal pain, diarrhea and constipation.  Musculoskeletal: Negative for myalgias and arthralgias.  Skin: Negative for color change and rash.  Hematological: Negative for adenopathy. Does not bruise/bleed easily.  Psychiatric/Behavioral: Negative for sleep disturbance and dysphoric mood. The patient is not nervous/anxious.        Objective:    BP 142/79 mmHg  Pulse  59  Temp(Src) 97.8 F (36.6 C) (Oral)  Ht 5\' 3"  (1.6 m)  Wt 171 lb 12 oz (77.905 kg)  BMI 30.43 kg/m2  SpO2 98% Physical Exam  Constitutional: She is oriented to person, place, and time. She appears well-developed and well-nourished. No distress.  HENT:  Head: Normocephalic and atraumatic.  Right Ear: External ear normal.  Left Ear: External ear normal.  Nose: Nose normal.  Mouth/Throat: Oropharynx is clear and moist. No oropharyngeal exudate.  Eyes: Conjunctivae are normal. Pupils are equal, round, and reactive to light. Right eye exhibits no discharge. Left eye exhibits no discharge. No scleral icterus.  Neck: Normal range of motion. Neck supple. No tracheal deviation present. No thyromegaly present.  Cardiovascular: Normal rate, regular rhythm, normal heart sounds and intact distal pulses.  Exam reveals no  gallop and no friction rub.   No murmur heard. Pulmonary/Chest: Effort normal and breath sounds normal. No respiratory distress. She has no wheezes. She has no rales. She exhibits no tenderness.  Musculoskeletal: Normal range of motion. She exhibits no edema or tenderness.  Lymphadenopathy:    She has no cervical adenopathy.  Neurological: She is alert and oriented to person, place, and time. No cranial nerve deficit. She exhibits normal muscle tone. Coordination normal.  Skin: Skin is warm and dry. No rash noted. She is not diaphoretic. No erythema. No pallor.  Psychiatric: She has a normal mood and affect. Her behavior is normal. Judgment and thought content normal.          Assessment & Plan:   Problem List Items Addressed This Visit      High   Hypertension - Primary    BP Readings from Last 3 Encounters:  01/17/15 142/79  01/02/15 135/79  12/26/14 135/68   BP well controlled generally. Will continue Losartan and Atenolol and monitor for now. Follow up 3 months and prn.      Relevant Orders   ECHOCARDIOGRAM COMPLETE   Hypothyroidism   Relevant Orders   TSH     Unprioritized   Abnormal CXR    CXR showed tortuosity of the descending aorta. We discussed that this is likely normal for her. Reviewed ECHO from 2009 which was normal. She has a family history of aortic aneurysm. Will plan to repeat ECHO for evaluation.          Return in about 3 months (around 04/17/2015) for Recheck.

## 2015-01-17 NOTE — Assessment & Plan Note (Signed)
BP Readings from Last 3 Encounters:  01/17/15 142/79  01/02/15 135/79  12/26/14 135/68   BP well controlled generally. Will continue Losartan and Atenolol and monitor for now. Follow up 3 months and prn.

## 2015-01-21 DIAGNOSIS — H401113 Primary open-angle glaucoma, right eye, severe stage: Secondary | ICD-10-CM | POA: Diagnosis not present

## 2015-01-24 ENCOUNTER — Other Ambulatory Visit: Payer: Self-pay | Admitting: Internal Medicine

## 2015-01-24 DIAGNOSIS — I10 Essential (primary) hypertension: Secondary | ICD-10-CM

## 2015-01-24 DIAGNOSIS — I712 Thoracic aortic aneurysm, without rupture: Secondary | ICD-10-CM

## 2015-01-24 DIAGNOSIS — R42 Dizziness and giddiness: Secondary | ICD-10-CM

## 2015-01-24 DIAGNOSIS — I7123 Aneurysm of the descending thoracic aorta, without rupture: Secondary | ICD-10-CM

## 2015-02-05 ENCOUNTER — Ambulatory Visit (INDEPENDENT_AMBULATORY_CARE_PROVIDER_SITE_OTHER): Payer: Medicare Other

## 2015-02-05 ENCOUNTER — Other Ambulatory Visit: Payer: Self-pay

## 2015-02-05 DIAGNOSIS — I712 Thoracic aortic aneurysm, without rupture: Secondary | ICD-10-CM | POA: Diagnosis not present

## 2015-02-05 DIAGNOSIS — I10 Essential (primary) hypertension: Secondary | ICD-10-CM | POA: Diagnosis not present

## 2015-02-05 DIAGNOSIS — R42 Dizziness and giddiness: Secondary | ICD-10-CM

## 2015-02-05 DIAGNOSIS — I7123 Aneurysm of the descending thoracic aorta, without rupture: Secondary | ICD-10-CM

## 2015-02-14 ENCOUNTER — Ambulatory Visit: Payer: Medicare Other

## 2015-02-26 ENCOUNTER — Ambulatory Visit: Payer: Medicare Other

## 2015-03-25 ENCOUNTER — Telehealth: Payer: Self-pay | Admitting: *Deleted

## 2015-03-25 MED ORDER — LOSARTAN POTASSIUM 50 MG PO TABS: ORAL_TABLET | ORAL | Status: DC

## 2015-03-25 NOTE — Telephone Encounter (Signed)
Losartan filled

## 2015-03-25 NOTE — Telephone Encounter (Signed)
Patient has requested a medication refill for losartan 30 day supply [facility] in [place] [phone]

## 2015-04-01 ENCOUNTER — Other Ambulatory Visit: Payer: Self-pay | Admitting: Internal Medicine

## 2015-04-17 ENCOUNTER — Ambulatory Visit (INDEPENDENT_AMBULATORY_CARE_PROVIDER_SITE_OTHER): Payer: Medicare Other | Admitting: Internal Medicine

## 2015-04-17 ENCOUNTER — Encounter: Payer: Self-pay | Admitting: Internal Medicine

## 2015-04-17 VITALS — BP 127/79 | HR 61 | Temp 98.2°F | Ht 63.0 in | Wt 173.1 lb

## 2015-04-17 DIAGNOSIS — Z1239 Encounter for other screening for malignant neoplasm of breast: Secondary | ICD-10-CM

## 2015-04-17 DIAGNOSIS — E119 Type 2 diabetes mellitus without complications: Secondary | ICD-10-CM

## 2015-04-17 DIAGNOSIS — E039 Hypothyroidism, unspecified: Secondary | ICD-10-CM | POA: Diagnosis not present

## 2015-04-17 DIAGNOSIS — M2550 Pain in unspecified joint: Secondary | ICD-10-CM | POA: Insufficient documentation

## 2015-04-17 DIAGNOSIS — I10 Essential (primary) hypertension: Secondary | ICD-10-CM

## 2015-04-17 LAB — COMPREHENSIVE METABOLIC PANEL
ALT: 30 U/L (ref 0–35)
AST: 22 U/L (ref 0–37)
Albumin: 4.2 g/dL (ref 3.5–5.2)
Alkaline Phosphatase: 71 U/L (ref 39–117)
BUN: 19 mg/dL (ref 6–23)
CHLORIDE: 104 meq/L (ref 96–112)
CO2: 29 mEq/L (ref 19–32)
Calcium: 9.5 mg/dL (ref 8.4–10.5)
Creatinine, Ser: 0.79 mg/dL (ref 0.40–1.20)
GFR: 74.77 mL/min (ref >60.00)
GLUCOSE: 101 mg/dL — AB (ref 70–99)
POTASSIUM: 4 meq/L (ref 3.5–5.1)
Sodium: 139 mEq/L (ref 135–145)
TOTAL PROTEIN: 7 g/dL (ref 6.0–8.3)
Total Bilirubin: 0.5 mg/dL (ref 0.2–1.2)

## 2015-04-17 LAB — LIPID PANEL
CHOL/HDL RATIO: 3
Cholesterol: 119 mg/dL (ref 0–200)
HDL: 43 mg/dL (ref >39.00)
LDL Cholesterol: 49 mg/dL (ref 0–99)
NONHDL: 75.95
Triglycerides: 133 mg/dL (ref 0.0–149.0)
VLDL: 26.6 mg/dL (ref 0.0–40.0)

## 2015-04-17 LAB — HEMOGLOBIN A1C: Hgb A1c MFr Bld: 6.6 % — ABNORMAL HIGH (ref 4.6–6.5)

## 2015-04-17 LAB — TSH: TSH: 0.88 u[IU]/mL (ref 0.35–4.50)

## 2015-04-17 MED ORDER — ONDANSETRON 8 MG PO TBDP: 8.0000 mg | ORAL_TABLET | Freq: Three times a day (TID) | ORAL | Status: DC | PRN

## 2015-04-17 MED ORDER — CIPROFLOXACIN HCL 500 MG PO TABS: 500.0000 mg | ORAL_TABLET | Freq: Two times a day (BID) | ORAL | Status: DC

## 2015-04-17 NOTE — Progress Notes (Signed)
Pre visit review using our clinic review tool, if applicable. No additional management support is needed unless otherwise documented below in the visit note. 

## 2015-04-17 NOTE — Assessment & Plan Note (Signed)
Will check TSH with labs. Continue Levothyroxine. 

## 2015-04-17 NOTE — Assessment & Plan Note (Signed)
Diffuse arthralgia. Likely OA. Improved with aspirin. Will continue.

## 2015-04-17 NOTE — Assessment & Plan Note (Signed)
A1c with labs. DM has been diet controlled given allergy to metformin. Will monitor for now.

## 2015-04-17 NOTE — Assessment & Plan Note (Signed)
Mammogram ordered

## 2015-04-17 NOTE — Patient Instructions (Signed)
If continued joint pain, consider trying Meloxicam to help with symptoms.  Labs today.  Please call to schedule your mammogram. We have placed an order.

## 2015-04-17 NOTE — Assessment & Plan Note (Signed)
BP Readings from Last 3 Encounters:  04/17/15 127/79  01/17/15 142/79  01/02/15 135/79   BP well controlled. Renal function with labs. Continue current medication.

## 2015-04-17 NOTE — Progress Notes (Signed)
Subjective:    Patient ID: [name], female    DOB: 1934/11/26, 81 y.o.   MRN: [id number]  HPI  81YO female presents for follow up.  Lab Results  Component Value Date   HGBA1C 6.5 12/13/2014   DM - BG have been more elevated, especially over the holidays.This morning BG 117.  Arthralgia - having joint pain in shoulders, knees. Taking aspirin with some improvement.  Traveling to [place] for one month in May.   Wt Readings from Last 3 Encounters:  04/17/15 173 lb 2 oz (78.529 kg)  01/17/15 171 lb 12 oz (77.905 kg)  01/02/15 173 lb 6 oz (78.642 kg)   BP Readings from Last 3 Encounters:  04/17/15 127/79  01/17/15 142/79  01/02/15 135/79    Past Medical History  Diagnosis Date  . Arthritis   . Chicken pox   . Generalized headaches   . Glaucoma   . Allergy     hay fever  . Colon polyp   . Urinary incontinence   . UTI (urinary tract infection)   . Diabetes mellitus 2008  . Hypertension   . Thyroid disease     hypothyroidism   Family History  Problem Relation Age of Onset  . Heart disease Mother   . Asthma Mother   . Heart attack Mother 68  . Heart disease Father   . Deep vein thrombosis Father   . Leukemia Brother   . Cancer Brother   . Arthritis Other   . Diabetes Other   . Diabetes Sister   . Cancer Maternal Aunt     ovarian?  . Crohn's disease Brother   . Diabetes Brother   . Cancer Maternal Grandfather     colon   Past Surgical History  Procedure Laterality Date  . Appendectomy  1998  . Cholecystectomy    . Tonsillectomy    . Bunionectomy    . Eye surgery      R  eye-lid drop surgery   Social History   Social History  . Marital Status: Married    Spouse Name: N/A  . Number of Children: N/A  . Years of Education: N/A   Social History Main Topics  . Smoking status: Never Smoker   . Smokeless tobacco: None  . Alcohol Use: Yes     Comment: occasional  . Drug Use: No  . Sexual Activity: Yes   Other Topics Concern  .  None   Social History Narrative   Lives in [place], from [place]. Daughter lives here. 4 daughters.      Work - retired Data processing manager   Diet - regular   Exercise - bike daily at home    Review of Systems  Constitutional: Negative for fever, chills, appetite change, fatigue and unexpected weight change.  Eyes: Negative for visual disturbance.  Respiratory: Negative for shortness of breath.   Cardiovascular: Negative for chest pain and leg swelling.  Gastrointestinal: Negative for nausea, vomiting, abdominal pain, diarrhea and constipation.  Musculoskeletal: Positive for myalgias and arthralgias.  Skin: Negative for color change and rash.  Hematological: Negative for adenopathy. Does not bruise/bleed easily.  Psychiatric/Behavioral: Negative for sleep disturbance and dysphoric mood. The patient is not nervous/anxious.        Objective:    BP 127/79 mmHg  Pulse 61  Temp(Src) 98.2 F (36.8 C) (Oral)  Ht 5\' 3"  (1.6 m)  Wt 173 lb 2 oz (78.529 kg)  BMI 30.68 kg/m2  SpO2 96% Physical Exam  Constitutional: She is oriented to person, place, and time. She appears well-developed and well-nourished. No distress.  HENT:  Head: Normocephalic and atraumatic.  Right Ear: External ear normal.  Left Ear: External ear normal.  Nose: Nose normal.  Mouth/Throat: Oropharynx is clear and moist. No oropharyngeal exudate.  Eyes: Conjunctivae are normal. Pupils are equal, round, and reactive to light. Right eye exhibits no discharge. Left eye exhibits no discharge. No scleral icterus.  Neck: Normal range of motion. Neck supple. No tracheal deviation present. No thyromegaly present.  Cardiovascular: Normal rate, regular rhythm, normal heart sounds and intact distal pulses.  Exam reveals no gallop and no friction rub.   No murmur heard. Pulmonary/Chest: Effort normal and breath sounds normal. No respiratory distress. She has no wheezes. She has no rales. She exhibits no tenderness.  Musculoskeletal: Normal  range of motion. She exhibits no edema or tenderness.  Lymphadenopathy:    She has no cervical adenopathy.  Neurological: She is alert and oriented to person, place, and time. No cranial nerve deficit. She exhibits normal muscle tone. Coordination normal.  Skin: Skin is warm and dry. No rash noted. She is not diaphoretic. No erythema. No pallor.  Psychiatric: She has a normal mood and affect. Her behavior is normal. Judgment and thought content normal.          Assessment & Plan:   Problem List Items Addressed This Visit      High   Diabetes mellitus type 2, controlled ([place]) - Primary    A1c with labs. DM has been diet controlled given allergy to metformin. Will monitor for now.      Relevant Orders   Comprehensive metabolic panel   Hemoglobin A1c   Lipid panel   Microalbumin / creatinine urine ratio   Hypertension    BP Readings from Last 3 Encounters:  04/17/15 127/79  01/17/15 142/79  01/02/15 135/79   BP well controlled. Renal function with labs. Continue current medication.      Hypothyroidism    Will check TSH with labs. Continue Levothyroxine.      Relevant Orders   TSH     Unprioritized   Arthralgia    Diffuse arthralgia. Likely OA. Improved with aspirin. Will continue.      Screening for breast cancer    Mammogram ordered      Relevant Orders   MM Digital Screening       Return in about 3 months (around 07/18/2015) for Recheck of Diabetes.  Ronette Deter, MD Internal [facility]

## 2015-04-18 LAB — MICROALBUMIN / CREATININE URINE RATIO
Creatinine,U: 36.3 mg/dL
Microalb Creat Ratio: 1.9 mg/g (ref 0.0–30.0)
Microalb, Ur: <0.7 mg/dL (ref 0.0–1.9)

## 2015-04-25 ENCOUNTER — Other Ambulatory Visit: Payer: Self-pay | Admitting: Internal Medicine

## 2015-05-01 ENCOUNTER — Other Ambulatory Visit: Payer: Self-pay | Admitting: Internal Medicine

## 2015-05-01 ENCOUNTER — Ambulatory Visit
Admission: RE | Admit: 2015-05-01 | Discharge: 2015-05-01 | Disposition: A | Discharge status: Home / Self Care | Payer: Medicare Other | Source: Ambulatory Visit | Attending: Internal Medicine | Admitting: Internal Medicine

## 2015-05-01 DIAGNOSIS — Z1231 Encounter for screening mammogram for malignant neoplasm of breast: Secondary | ICD-10-CM | POA: Insufficient documentation

## 2015-05-01 DIAGNOSIS — Z1239 Encounter for other screening for malignant neoplasm of breast: Secondary | ICD-10-CM

## 2015-05-06 LAB — HM MAMMOGRAPHY

## 2015-05-18 ENCOUNTER — Encounter: Payer: Self-pay | Admitting: Internal Medicine

## 2015-05-19 ENCOUNTER — Other Ambulatory Visit: Payer: Self-pay | Admitting: Internal Medicine

## 2015-05-19 ENCOUNTER — Encounter: Payer: Self-pay | Admitting: Internal Medicine

## 2015-05-19 MED ORDER — MELOXICAM 15 MG PO TABS: 15.0000 mg | ORAL_TABLET | Freq: Every day | ORAL | Status: DC

## 2015-05-23 DIAGNOSIS — H401113 Primary open-angle glaucoma, right eye, severe stage: Secondary | ICD-10-CM | POA: Diagnosis not present

## 2015-05-28 ENCOUNTER — Telehealth: Payer: Self-pay | Admitting: Internal Medicine

## 2015-05-28 ENCOUNTER — Other Ambulatory Visit: Payer: Self-pay | Admitting: *Deleted

## 2015-05-28 MED ORDER — LOSARTAN POTASSIUM 50 MG PO TABS: ORAL_TABLET | ORAL | Status: DC

## 2015-05-28 NOTE — Telephone Encounter (Signed)
Pt called stating she needs a Rx refill for losartan (COZAAR) 50 MG tablet. 90 day supply.  Pharmacy is [facility]. Call pt @ [phone]. Thank you!

## 2015-05-28 NOTE — Telephone Encounter (Signed)
Sent 90 day supply of Losartan 50mg  to [facility] service and notified patient.

## 2015-06-13 LAB — HM DIABETES EYE EXAM

## 2015-07-18 ENCOUNTER — Ambulatory Visit: Payer: Medicare Other | Admitting: Internal Medicine

## 2015-08-13 ENCOUNTER — Ambulatory Visit (INDEPENDENT_AMBULATORY_CARE_PROVIDER_SITE_OTHER): Payer: Medicare Other | Admitting: Internal Medicine

## 2015-08-13 ENCOUNTER — Encounter: Payer: Self-pay | Admitting: Internal Medicine

## 2015-08-13 VITALS — BP 150/92 | HR 54 | Ht 63.0 in | Wt 173.0 lb

## 2015-08-13 DIAGNOSIS — I10 Essential (primary) hypertension: Secondary | ICD-10-CM | POA: Diagnosis not present

## 2015-08-13 DIAGNOSIS — E119 Type 2 diabetes mellitus without complications: Secondary | ICD-10-CM | POA: Diagnosis not present

## 2015-08-13 DIAGNOSIS — E039 Hypothyroidism, unspecified: Secondary | ICD-10-CM | POA: Diagnosis not present

## 2015-08-13 LAB — COMPREHENSIVE METABOLIC PANEL
ALBUMIN: 4.2 g/dL (ref 3.5–5.2)
ALT: 34 U/L (ref 0–35)
AST: 20 U/L (ref 0–37)
Alkaline Phosphatase: 65 U/L (ref 39–117)
BUN: 16 mg/dL (ref 6–23)
CALCIUM: 9.2 mg/dL (ref 8.4–10.5)
CHLORIDE: 105 meq/L (ref 96–112)
CO2: 30 mEq/L (ref 19–32)
Creatinine, Ser: 0.8 mg/dL (ref 0.40–1.20)
GFR: 73.63 mL/min (ref >60.00)
Glucose, Bld: 115 mg/dL — ABNORMAL HIGH (ref 70–99)
POTASSIUM: 4.1 meq/L (ref 3.5–5.1)
SODIUM: 140 meq/L (ref 135–145)
Total Bilirubin: 0.8 mg/dL (ref 0.2–1.2)
Total Protein: 7.2 g/dL (ref 6.0–8.3)

## 2015-08-13 LAB — LIPID PANEL
CHOLESTEROL: 121 mg/dL (ref 0–200)
HDL: 44.7 mg/dL (ref >39.00)
LDL Cholesterol: 61 mg/dL (ref 0–99)
NonHDL: 75.96
Total CHOL/HDL Ratio: 3
Triglycerides: 75 mg/dL (ref 0.0–149.0)
VLDL: 15 mg/dL (ref 0.0–40.0)

## 2015-08-13 LAB — MICROALBUMIN / CREATININE URINE RATIO
Creatinine,U: 127.2 mg/dL
MICROALB UR: 2.2 mg/dL — AB (ref 0.0–1.9)
MICROALB/CREAT RATIO: 1.7 mg/g (ref 0.0–30.0)

## 2015-08-13 LAB — HEMOGLOBIN A1C: HEMOGLOBIN A1C: 6.7 % — AB (ref 4.6–6.5)

## 2015-08-13 LAB — HM DIABETES FOOT EXAM: HM DIABETIC FOOT EXAM: NORMAL

## 2015-08-13 LAB — TSH: TSH: 0.89 u[IU]/mL (ref 0.35–4.50)

## 2015-08-13 NOTE — Assessment & Plan Note (Signed)
BG generally controlled with diet. Will check A1c with labs today. Foot exam normal today except as noted. Eye exam is UTD.

## 2015-08-13 NOTE — Assessment & Plan Note (Signed)
BP Readings from Last 3 Encounters:  08/13/15 150/92  04/17/15 127/79  01/17/15 142/79   BP well controlled generally, however slightly elevated today. Will check renal function with labs. Continue Losartan. Plan for recheck of BP when she establishes care in a few weeks with new provider.

## 2015-08-13 NOTE — Assessment & Plan Note (Signed)
Will check TSH with labs. Continue Levothyroxine. 

## 2015-08-13 NOTE — Progress Notes (Signed)
Subjective:    Patient ID: [name], female    DOB: 11-Apr-1934, 81 y.o.   MRN: [id number]  HPI  81YO female presents for follow up.  DM - BG generally well controlled. Some elevated BG noted on recent 30 day cruise when eating more desserts.   Lab Results  Component Value Date   HGBA1C 6.6* 04/17/2015   Arthralgia - Notes pain in right shoulder and right hip at times. Taking Tylenol or aspirin with some improvement. No longer using Meloxicam.  Wt Readings from Last 3 Encounters:  08/13/15 173 lb (78.472 kg)  04/17/15 173 lb 2 oz (78.529 kg)  01/17/15 171 lb 12 oz (77.905 kg)   BP Readings from Last 3 Encounters:  08/13/15 150/92  04/17/15 127/79  01/17/15 142/79    Past Medical History  Diagnosis Date  . Arthritis   . Chicken pox   . Generalized headaches   . Glaucoma   . Allergy     hay fever  . Colon polyp   . Urinary incontinence   . UTI (urinary tract infection)   . Diabetes mellitus 2008  . Hypertension   . Thyroid disease     hypothyroidism   Family History  Problem Relation Age of Onset  . Heart disease Mother   . Asthma Mother   . Heart attack Mother 9  . Heart disease Father   . Deep vein thrombosis Father   . Leukemia Brother   . Cancer Brother   . Arthritis Other   . Diabetes Other   . Diabetes Sister   . Cancer Maternal Aunt     ovarian?  . Crohn's disease Brother   . Diabetes Brother   . Cancer Maternal Grandfather     colon   Past Surgical History  Procedure Laterality Date  . Appendectomy  1998  . Cholecystectomy    . Tonsillectomy    . Bunionectomy    . Eye surgery      R  eye-lid drop surgery   Social History   Social History  . Marital Status: Married    Spouse Name: N/A  . Number of Children: N/A  . Years of Education: N/A   Social History Main Topics  . Smoking status: Never Smoker   . Smokeless tobacco: Never Used  . Alcohol Use: 0.0 oz/week    0 Standard drinks or equivalent per week     Comment:  occasional  . Drug Use: No  . Sexual Activity: Yes   Other Topics Concern  . None   Social History Narrative   Lives in [place], from [place]. Daughter lives here. 4 daughters.      Work - retired Data processing manager   Diet - regular   Exercise - bike daily at home    Review of Systems  Constitutional: Negative for fever, chills, appetite change, fatigue and unexpected weight change.  Eyes: Negative for visual disturbance.  Respiratory: Negative for cough, chest tightness and shortness of breath.   Cardiovascular: Negative for chest pain and leg swelling.  Gastrointestinal: Negative for nausea, vomiting, abdominal pain, diarrhea and constipation.  Musculoskeletal: Negative for myalgias and arthralgias.  Skin: Negative for color change and rash.  Hematological: Negative for adenopathy. Does not bruise/bleed easily.  Psychiatric/Behavioral: Negative for suicidal ideas, sleep disturbance and dysphoric mood. The patient is not nervous/anxious.        Objective:    BP 150/92 mmHg  Pulse 54  Ht 5\' 3"  (1.6 m)  Wt 173 lb (  78.472 kg)  BMI 30.65 kg/m2  SpO2 97% Physical Exam  Constitutional: She is oriented to person, place, and time. She appears well-developed and well-nourished. No distress.  HENT:  Head: Normocephalic and atraumatic.  Right Ear: External ear normal.  Left Ear: External ear normal.  Nose: Nose normal.  Mouth/Throat: Oropharynx is clear and moist. No oropharyngeal exudate.  Eyes: Conjunctivae are normal. Pupils are equal, round, and reactive to light. Right eye exhibits no discharge. Left eye exhibits no discharge. No scleral icterus.  Neck: Normal range of motion. Neck supple. No tracheal deviation present. No thyromegaly present.  Cardiovascular: Normal rate, regular rhythm, normal heart sounds and intact distal pulses.  Exam reveals no gallop and no friction rub.   No murmur heard. Pulmonary/Chest: Effort normal and breath sounds normal. No respiratory distress. She has no  wheezes. She has no rales. She exhibits no tenderness.  Musculoskeletal: Normal range of motion. She exhibits no edema or tenderness.  Lymphadenopathy:    She has no cervical adenopathy.  Neurological: She is alert and oriented to person, place, and time. No cranial nerve deficit. She exhibits normal muscle tone. Coordination normal.  Skin: Skin is warm and dry. No rash noted. She is not diaphoretic. No erythema. No pallor.  Psychiatric: She has a normal mood and affect. Her behavior is normal. Judgment and thought content normal.          Assessment & Plan:   Problem List Items Addressed This Visit      High   Diabetes mellitus type 2, controlled ([place]) - Primary    BG generally controlled with diet. Will check A1c with labs today. Foot exam normal today except as noted. Eye exam is UTD.      Relevant Orders   Comprehensive metabolic panel   Hemoglobin A1c   Microalbumin / creatinine urine ratio   Lipid panel   TSH   Hypertension    BP Readings from Last 3 Encounters:  08/13/15 150/92  04/17/15 127/79  01/17/15 142/79   BP well controlled generally, however slightly elevated today. Will check renal function with labs. Continue Losartan. Plan for recheck of BP when she establishes care in a few weeks with new provider.      Hypothyroidism    Will check TSH with labs. Continue Levothyroxine.          Return in about 3 months (around 11/13/2015) for Recheck of Diabetes, New Patient.  Ronette Deter, MD Internal Medicine [facility]

## 2015-08-13 NOTE — Progress Notes (Signed)
Pre visit review using our clinic review tool, if applicable. No additional management support is needed unless otherwise documented below in the visit note. 

## 2015-08-13 NOTE — Patient Instructions (Signed)
Labs today.  We will set up evaluation with Dr. Lynnae Sandhoff.

## 2015-09-22 DIAGNOSIS — I788 Other diseases of capillaries: Secondary | ICD-10-CM | POA: Diagnosis not present

## 2015-09-22 DIAGNOSIS — L0101 Non-bullous impetigo: Secondary | ICD-10-CM | POA: Diagnosis not present

## 2015-09-22 DIAGNOSIS — L821 Other seborrheic keratosis: Secondary | ICD-10-CM | POA: Diagnosis not present

## 2015-09-22 DIAGNOSIS — R202 Paresthesia of skin: Secondary | ICD-10-CM | POA: Diagnosis not present

## 2015-09-26 DIAGNOSIS — H401113 Primary open-angle glaucoma, right eye, severe stage: Secondary | ICD-10-CM | POA: Diagnosis not present

## 2015-10-08 ENCOUNTER — Other Ambulatory Visit: Payer: Self-pay

## 2015-10-08 MED ORDER — LEVOTHYROXINE SODIUM 112 MCG PO TABS: 112.0000 ug | ORAL_TABLET | Freq: Every day | ORAL | 0 refills | Status: DC

## 2015-10-13 ENCOUNTER — Encounter: Payer: Self-pay | Admitting: Internal Medicine

## 2015-10-13 ENCOUNTER — Ambulatory Visit (INDEPENDENT_AMBULATORY_CARE_PROVIDER_SITE_OTHER): Payer: Medicare Other | Admitting: Internal Medicine

## 2015-10-13 VITALS — BP 126/68 | HR 64 | Temp 97.7°F | Wt 175.0 lb

## 2015-10-13 DIAGNOSIS — R0982 Postnasal drip: Secondary | ICD-10-CM

## 2015-10-13 DIAGNOSIS — R059 Cough, unspecified: Secondary | ICD-10-CM

## 2015-10-13 DIAGNOSIS — R05 Cough: Secondary | ICD-10-CM

## 2015-10-13 DIAGNOSIS — Z9109 Other allergy status, other than to drugs and biological substances: Secondary | ICD-10-CM

## 2015-10-13 DIAGNOSIS — Z91048 Other nonmedicinal substance allergy status: Secondary | ICD-10-CM

## 2015-10-13 MED ORDER — LEVOCETIRIZINE DIHYDROCHLORIDE 5 MG PO TABS: 5.0000 mg | ORAL_TABLET | Freq: Every evening | ORAL | 0 refills | Status: DC

## 2015-10-13 NOTE — Patient Instructions (Signed)

## 2015-10-13 NOTE — Progress Notes (Signed)
HPI  Pt presents to the clinic today with c/o nasal congestion, ear fullness, sore throat and cough. This started 1 week ago. She denies decreased hearing. She is not blowing anything out of her nose. She denies difficulty swallowing. The cough is non productive and it is not keeping her up at night. She denies fever but has had some chills. She has not tried anything OTC for this. She does have a history of allergies and is taking Singulair daily and recently was started on Pazeo eye drops. She has not had sick contacts that she is aware of.  Review of Systems      Past Medical History:  Diagnosis Date  . Allergy    hay fever  . Arthritis   . Chicken pox   . Colon polyp   . Diabetes mellitus 2008  . Generalized headaches   . Glaucoma   . Hypertension   . Thyroid disease    hypothyroidism  . Urinary incontinence   . UTI (urinary tract infection)     Family History  Problem Relation Age of Onset  . Heart disease Mother   . Asthma Mother   . Heart attack Mother 55  . Heart disease Father   . Deep vein thrombosis Father   . Leukemia Brother   . Cancer Brother   . Diabetes Sister   . Crohn's disease Brother   . Diabetes Brother   . Arthritis Other   . Diabetes Other   . Cancer Maternal Aunt     ovarian?  . Cancer Maternal Grandfather     colon    Social History   Social History  . Marital status: Married    Spouse name: N/A  . Number of children: N/A  . Years of education: N/A   Occupational History  . Not on file.   Social History Main Topics  . Smoking status: Never Smoker  . Smokeless tobacco: Never Used  . Alcohol use 0.0 oz/week     Comment: occasional  . Drug use: No  . Sexual activity: Yes   Other Topics Concern  . Not on file   Social History Narrative   Lives in [place], from [place]. Daughter lives here. 4 daughters.      Work - retired Data processing manager   Diet - regular   Exercise - bike daily at home    Allergies  Allergen Reactions  . Augmentin  [Amoxicillin-Pot Clavulanate]     Possible, had itchy rash  . Cefprozil Swelling  . Metformin And Related   . Timolol     Local reaction to eye drops     Constitutional: Positive fatigue. Denies headache, fever or abrupt weight changes.  HEENT:  Positive ear fullness, nasal congestion, sore throat. Denies eye redness, eye pain, pressure behind the eyes, facial pain, ear pain, ringing in the ears, wax buildup, runny nose or bloody nose. Respiratory: Positive cough. Denies difficulty breathing or shortness of breath.  Cardiovascular: Denies chest pain, chest tightness, palpitations or swelling in the hands or feet.   No other specific complaints in a complete review of systems (except as listed in HPI above).  Objective:   BP 126/68   Pulse 64   Temp 97.7 F (36.5 C) (Oral)   Wt 175 lb (79.4 kg)   SpO2 97%   BMI 31.00 kg/m  Wt Readings from Last 3 Encounters:  10/13/15 175 lb (79.4 kg)  08/13/15 173 lb (78.5 kg)  04/17/15 173 lb 2 oz (78.5 kg)  General: Appears her stated age,  in NAD. HEENT: Head: normal shape and size, no sinus tenderness noted; Eyes: sclera white, no icterus, conjunctiva pink; Ears: Tm's gray and intact, normal light reflex; Nose: mucosa pink and moist, septum midline; Throat/Mouth: + PND. Teeth present, mucosa pink and moist, no exudate noted, no lesions or ulcerations noted.  Neck: No cervical lymphadenopathy.  Cardiovascular: Normal rate and rhythm. S1,S2 noted.  No murmur, rubs or gallops noted.  Pulmonary/Chest: Normal effort and positive vesicular breath sounds. No respiratory distress. No wheezes, rales or ronchi noted.      Assessment & Plan:   Cough secondary to PND:  Get some rest and drink plenty of water Do salt water gargles for the sore throat Start Xyzal OTC Continue Singulair daily Delsym OTC as needed for cough  RTC as needed or if symptoms persist.   Webb Silversmith, NP

## 2015-11-13 ENCOUNTER — Ambulatory Visit: Payer: Medicare Other

## 2015-11-13 ENCOUNTER — Encounter: Payer: Self-pay | Admitting: Family Medicine

## 2015-11-13 ENCOUNTER — Ambulatory Visit (INDEPENDENT_AMBULATORY_CARE_PROVIDER_SITE_OTHER): Payer: Medicare Other | Admitting: Family Medicine

## 2015-11-13 VITALS — BP 138/78 | HR 64 | Temp 97.4°F | Wt 174.0 lb

## 2015-11-13 DIAGNOSIS — R252 Cramp and spasm: Secondary | ICD-10-CM

## 2015-11-13 DIAGNOSIS — Z23 Encounter for immunization: Secondary | ICD-10-CM

## 2015-11-13 DIAGNOSIS — E039 Hypothyroidism, unspecified: Secondary | ICD-10-CM | POA: Diagnosis not present

## 2015-11-13 DIAGNOSIS — I10 Essential (primary) hypertension: Secondary | ICD-10-CM

## 2015-11-13 DIAGNOSIS — J309 Allergic rhinitis, unspecified: Secondary | ICD-10-CM

## 2015-11-13 DIAGNOSIS — E1142 Type 2 diabetes mellitus with diabetic polyneuropathy: Secondary | ICD-10-CM

## 2015-11-13 LAB — BASIC METABOLIC PANEL
BUN: 18 mg/dL (ref 6–23)
CALCIUM: 9.4 mg/dL (ref 8.4–10.5)
CHLORIDE: 104 meq/L (ref 96–112)
CO2: 30 meq/L (ref 19–32)
Creatinine, Ser: 0.82 mg/dL (ref 0.40–1.20)
GFR: 71.51 mL/min (ref >60.00)
GLUCOSE: 97 mg/dL (ref 70–99)
POTASSIUM: 4.2 meq/L (ref 3.5–5.1)
SODIUM: 140 meq/L (ref 135–145)

## 2015-11-13 LAB — HEMOGLOBIN A1C: Hgb A1c MFr Bld: 6.6 % — ABNORMAL HIGH (ref 4.6–6.5)

## 2015-11-13 MED ORDER — MONTELUKAST SODIUM 10 MG PO TABS: 10.0000 mg | ORAL_TABLET | Freq: Every day | ORAL | 3 refills | Status: DC

## 2015-11-13 MED ORDER — GLUCOSE BLOOD VI STRP: ORAL_STRIP | 3 refills | Status: DC

## 2015-11-13 MED ORDER — LANCET DEVICE MISC: 3 refills | Status: DC

## 2015-11-13 MED ORDER — ONETOUCH DELICA LANCETS FINE MISC: 3 refills | Status: DC

## 2015-11-13 MED ORDER — LEVOTHYROXINE SODIUM 112 MCG PO TABS: 112.0000 ug | ORAL_TABLET | Freq: Every day | ORAL | 3 refills | Status: DC

## 2015-11-13 MED ORDER — LOSARTAN POTASSIUM 50 MG PO TABS: ORAL_TABLET | ORAL | 3 refills | Status: DC

## 2015-11-13 MED ORDER — ATENOLOL 50 MG PO TABS: 50.0000 mg | ORAL_TABLET | Freq: Every day | ORAL | 3 refills | Status: DC

## 2015-11-13 NOTE — Assessment & Plan Note (Signed)
Stable on recent check.

## 2015-11-13 NOTE — Assessment & Plan Note (Signed)
Evidence of neuropathy on foot exam today. Not to point of desiring medication. Check A1c today, will recommend tight glycemic control, consider metformin if A1c <6.5%.

## 2015-11-13 NOTE — Progress Notes (Signed)
BP 138/78   Pulse 64   Temp 97.4 F (36.3 C) (Oral)   Wt 174 lb (78.9 kg)   BMI 30.82 kg/m    CC: new pt to establish care Subjective:    Patient ID: [name], female    DOB: 1934-06-21, 81 y.o.   MRN: [id number]  HPI: [name] is a 81 y.o. female presenting on 11/13/2015 for Establish Care (transfer from Dr. Gilford Rile)   Seen last month by [facility] for cough attributed to PND. Started xyzal and continued singulair.   DM - regularly does check sugars daily fasting - 130s - and occasionally randomly. Compliant with antihyperglycemic regimen which includes: diet controlled. Denies low sugars or hypoglycemic symptoms. Denies paresthesias. Occasional thicker sensation at soles. Last diabetic eye exam 05/2015. Pneumovax: 2013. Prevnar: 2015. Recent cruise 06/2015 Lab Results  Component Value Date   HGBA1C 6.7 (H) 08/13/2015   Diabetic Foot Exam - Simple   Simple Foot Form Diabetic Foot exam was performed with the following findings:  Yes 11/13/2015 12:48 PM  Visual Inspection No deformities, no ulcerations, no other skin breakdown bilaterally:  Yes Sensation Testing See comments:  Yes Pulse Check Posterior Tibialis and Dorsalis pulse intact bilaterally:  Yes Comments Diminished to monofilament testing      Some arthralgias R>L shoulders. Prior significant leg cramping predominantly at R thigh, none recently.  Relevant past medical, surgical, family and social history reviewed and updated as indicated. Interim medical history since our last visit reviewed. Allergies and medications reviewed and updated. Current Outpatient Prescriptions on File Prior to Visit  Medication Sig  . aspirin 81 MG tablet Take 81 mg by mouth daily.  . Blood Glucose Monitoring Suppl (ONE TOUCH ULTRA SYSTEM KIT) W/DEVICE KIT 1 kit by Does not apply route once.  . Lancets Misc. (ACCU-CHEK SOFTCLIX LANCET DEV) KIT Use at home to test blood sugars daily.  Marland Kitchen levocetirizine (XYZAL) 5 MG tablet Take 1  tablet (5 mg total) by mouth every evening.   No current facility-administered medications on file prior to visit.     Review of Systems Per HPI unless specifically indicated in ROS section     Objective:    BP 138/78   Pulse 64   Temp 97.4 F (36.3 C) (Oral)   Wt 174 lb (78.9 kg)   BMI 30.82 kg/m   Wt Readings from Last 3 Encounters:  11/13/15 174 lb (78.9 kg)  10/13/15 175 lb (79.4 kg)  08/13/15 173 lb (78.5 kg)    Physical Exam  Constitutional: She appears well-developed and well-nourished. No distress.  HENT:  Head: Normocephalic and atraumatic.  Right Ear: External ear normal.  Left Ear: External ear normal.  Nose: Nose normal.  Mouth/Throat: Oropharynx is clear and moist. No oropharyngeal exudate.  Eyes: Conjunctivae and EOM are normal. Pupils are equal, round, and reactive to light. No scleral icterus.  Neck: Normal range of motion. Neck supple.  Cardiovascular: Normal rate, regular rhythm, normal heart sounds and intact distal pulses.   No murmur heard. Pulmonary/Chest: Effort normal and breath sounds normal. No respiratory distress. She has no wheezes. She has no rales.  Musculoskeletal: She exhibits no edema.  See HPI for foot exam if done  Lymphadenopathy:    She has no cervical adenopathy.  Skin: Skin is warm and dry. No rash noted.  Psychiatric: She has a normal mood and affect.  Nursing note and vitals reviewed.  Results for orders placed or performed in visit on 08/13/15  HM  MAMMOGRAPHY  Result Value Ref Range   HM Mammogram 0-4 Bi-Rad 0-4 Bi-Rad, Self Reported Normal  Comprehensive metabolic panel  Result Value Ref Range   Sodium 140 135 - 145 mEq/L   Potassium 4.1 3.5 - 5.1 mEq/L   Chloride 105 96 - 112 mEq/L   CO2 30 19 - 32 mEq/L   Glucose, Bld 115 (H) 70 - 99 mg/dL   BUN 16 6 - 23 mg/dL   Creatinine, Ser 0.80 0.40 - 1.20 mg/dL   Total Bilirubin 0.8 0.2 - 1.2 mg/dL   Alkaline Phosphatase 65 39 - 117 U/L   AST 20 0 - 37 U/L   ALT 34 0 - 35  U/L   Total Protein 7.2 6.0 - 8.3 g/dL   Albumin 4.2 3.5 - 5.2 g/dL   Calcium 9.2 8.4 - 10.5 mg/dL   GFR 73.63 >60.00 mL/min  Hemoglobin A1c  Result Value Ref Range   Hgb A1c MFr Bld 6.7 (H) 4.6 - 6.5 %  Microalbumin / creatinine urine ratio  Result Value Ref Range   Microalb, Ur 2.2 (H) 0.0 - 1.9 mg/dL   Creatinine,U 127.2 mg/dL   Microalb Creat Ratio 1.7 0.0 - 30.0 mg/g  Lipid panel  Result Value Ref Range   Cholesterol 121 0 - 200 mg/dL   Triglycerides 75.0 0.0 - 149.0 mg/dL   HDL 44.70 >39.00 mg/dL   VLDL 15.0 0.0 - 40.0 mg/dL   LDL Cholesterol 61 0 - 99 mg/dL   Total CHOL/HDL Ratio 3    NonHDL 75.96   TSH  Result Value Ref Range   TSH 0.89 0.35 - 4.50 uIU/mL  HM DIABETES EYE EXAM  Result Value Ref Range   HM Diabetic Eye Exam No Retinopathy No Retinopathy  HM DIABETES FOOT EXAM  Result Value Ref Range   HM Diabetic Foot Exam Normal       Assessment & Plan:   Problem List Items Addressed This Visit    Allergic rhinitis    Improved on xyzal. Continue singulair. If sxs recur off xyzal, pt will let me know for refill.       Hypertension    Chronic, stable. Continue current regimen.       Relevant Medications   atenolol (TENORMIN) 50 MG tablet   losartan (COZAAR) 50 MG tablet   Hypothyroidism    Stable on recent check.       Relevant Medications   atenolol (TENORMIN) 50 MG tablet   levothyroxine (SYNTHROID, LEVOTHROID) 112 MCG tablet   Type 2 diabetes, controlled, with peripheral neuropathy ([place]) - Primary    Evidence of neuropathy on foot exam today. Not to point of desiring medication. Check A1c today, will recommend tight glycemic control, consider metformin if A1c <6.5%.       Relevant Medications   losartan (COZAAR) 50 MG tablet    Other Visit Diagnoses    Need for influenza vaccination       Relevant Orders   Flu Vaccine QUAD 36+ mos PF IM (Fluarix & Fluzone Quad PF) (Completed)   Cramps of right lower extremity       Relevant Orders   Basic  metabolic panel   Magnesium       Follow up plan: Return in about 1 month (around 12/13/2015) for annual exam, prior fasting for blood work, medicare wellness visit.  Ria Bush, MD

## 2015-11-13 NOTE — Assessment & Plan Note (Signed)
Chronic, stable. Continue current regimen. 

## 2015-11-13 NOTE — Assessment & Plan Note (Addendum)
Improved on xyzal. Continue singulair. If sxs recur off xyzal, pt will let me know for refill.

## 2015-11-13 NOTE — Patient Instructions (Addendum)
Flu shot today. Labwork today. We will change medicare wellness visit to here.  Revisaremos control de azucar hoy con sangre.  regresar para examen fisico en 1-2 meses.

## 2015-11-13 NOTE — Progress Notes (Signed)
Pre visit review using our clinic review tool, if applicable. No additional management support is needed unless otherwise documented below in the visit note. 

## 2015-11-14 ENCOUNTER — Telehealth: Payer: Self-pay | Admitting: Family Medicine

## 2015-11-14 LAB — MAGNESIUM: Magnesium: 2 mg/dL (ref 1.5–2.5)

## 2015-11-14 NOTE — Telephone Encounter (Signed)
Pt returned your call.  

## 2015-11-15 NOTE — Telephone Encounter (Signed)
See result note.  

## 2015-11-17 NOTE — Telephone Encounter (Signed)
Spoke with patient.

## 2015-11-28 DIAGNOSIS — H401113 Primary open-angle glaucoma, right eye, severe stage: Secondary | ICD-10-CM | POA: Diagnosis not present

## 2015-12-15 ENCOUNTER — Ambulatory Visit: Payer: Medicare Other

## 2015-12-16 ENCOUNTER — Encounter: Payer: Medicare Other | Admitting: Family Medicine

## 2015-12-18 ENCOUNTER — Other Ambulatory Visit: Payer: Self-pay | Admitting: Family Medicine

## 2015-12-19 ENCOUNTER — Ambulatory Visit: Payer: Medicare Other

## 2015-12-19 ENCOUNTER — Ambulatory Visit (INDEPENDENT_AMBULATORY_CARE_PROVIDER_SITE_OTHER): Payer: Medicare Other

## 2015-12-19 VITALS — BP 110/80 | HR 57 | Temp 98.5°F | Ht 62.25 in | Wt 171.2 lb

## 2015-12-19 DIAGNOSIS — Z0000 Encounter for general adult medical examination without abnormal findings: Secondary | ICD-10-CM

## 2015-12-19 NOTE — Progress Notes (Signed)
Pre visit review using our clinic review tool, if applicable. No additional management support is needed unless otherwise documented below in the visit note. 

## 2015-12-19 NOTE — Progress Notes (Signed)
Subjective:   [name] is a 81 y.o. female who presents for Medicare Annual (Subsequent) preventive examination.  Review of Systems:  N/A Cardiac Risk Factors include: advanced age (>52mn, >>31women);diabetes mellitus;obesity (BMI >30kg/m2);hypertension     Objective:     Vitals: BP 110/80 (BP Location: Left Arm, Patient Position: Sitting, Cuff Size: Normal)   Pulse (!) 57   Temp 98.5 F (36.9 C) (Oral)   Ht 5' 2.25" (1.581 m) Comment: no shoes  Wt 171 lb 4 oz (77.7 kg)   SpO2 96%   BMI 31.07 kg/m   Body mass index is 31.07 kg/m.   Tobacco History  Smoking Status  . Never Smoker  Smokeless Tobacco  . Never Used     Counseling given: Not Answered   Past Medical History:  Diagnosis Date  . Allergy    hay fever  . Arthritis   . Chicken pox   . Colon polyp   . Diabetes mellitus 2008  . Generalized headaches   . Glaucoma   . Hypertension   . Thyroid disease    hypothyroidism  . Urinary incontinence   . UTI (urinary tract infection)    Past Surgical History:  Procedure Laterality Date  . APPENDECTOMY  1998  . BUNIONECTOMY    . CHOLECYSTECTOMY    . EYE SURGERY     R  eye-lid drop surgery  . TONSILLECTOMY     Family History  Problem Relation Age of Onset  . Heart disease Mother   . Asthma Mother   . Heart attack Mother 648 . Heart disease Father   . Deep vein thrombosis Father   . Leukemia Brother   . Cancer Brother   . Diabetes Sister   . Crohn's disease Brother   . Diabetes Brother   . Arthritis Other   . Diabetes Other   . Cancer Maternal Aunt     ovarian?  . Cancer Maternal Grandfather     colon   History  Sexual Activity  . Sexual activity: Yes    Outpatient Encounter Prescriptions as of 12/19/2015  Medication Sig  . aspirin 81 MG tablet Take 81 mg by mouth as needed.   .Marland Kitchenatenolol (TENORMIN) 50 MG tablet Take 1 tablet (50 mg total) by mouth daily.  . Blood Glucose Monitoring Suppl (ONE TOUCH ULTRA SYSTEM KIT) W/DEVICE KIT 1  kit by Does not apply route once.  .Marland Kitchenglucose blood test strip Use as instructed for One Touch Ultra. Dx: E11.9  . Lancet Device MISC Use as directed  . Lancets Misc. (ACCU-CHEK SOFTCLIX LANCET DEV) KIT Use at home to test blood sugars daily.  .Marland Kitchenlevothyroxine (SYNTHROID, LEVOTHROID) 112 MCG tablet Take 1 tablet (112 mcg total) by mouth daily.  .Marland Kitchenlosartan (COZAAR) 50 MG tablet Take 1 tablet by mouth  daily  . montelukast (SINGULAIR) 10 MG tablet Take 1 tablet (10 mg total) by mouth daily.  .Glory RosebushDELICA LANCETS FINE MISC Use as directed to check sugars daily and PRN  . [DISCONTINUED] levocetirizine (XYZAL) 5 MG tablet Take 1 tablet (5 mg total) by mouth every evening.   No facility-administered encounter medications on file as of 12/19/2015.     Activities of Daily Living In your present state of health, do you have any difficulty performing the following activities: 12/19/2015  Hearing? N  Vision? N  Difficulty concentrating or making decisions? Y  Walking or climbing stairs? N  Dressing or bathing? N  Doing errands, shopping? N  Preparing Food and eating ? N  Using the Toilet? N  In the past six months, have you accidently leaked urine? Y  Do you have problems with loss of bowel control? N  Managing your Medications? N  Managing your Finances? N  Housekeeping or managing your Housekeeping? N  Some recent data might be hidden    Patient Care Team: Ria Bush, MD as PCP - General (Family Medicine) Ronnell Freshwater, MD as Referring Physician (Ophthalmology)    Assessment:     Hearing Screening   _0  _1  _2  _3  _4  _5  _6  _7  _8   Right ear:   40 0 0  0    Left ear:   40 0 0  0    Vision Screening Comments: Last vision exam with Dr. Joan Mayans in October 2017   Exercise Activities and Dietary recommendations Current Exercise Habits: Home exercise routine, Type of exercise: Other - see comments (stationary bike), Time (Minutes): 30,  Frequency (Times/Week): 5, Weekly Exercise (Minutes/Week): 150, Intensity: Moderate, Exercise limited by: None identified  Goals    . Increase physical activity          Currently uses the exercise bike x2 days a week for 30 minute sessions.  Add up to 1 day per week for 30 min sessions as tolerated.      . Increase water intake          Currently drinks 3-4 sips of water daily.  Increase water intake up to 3-4 bottle OR 8 cups daily.      Fall Risk Fall Risk  12/19/2015 08/13/2015 12/13/2014 09/25/2013 09/25/2013  Falls in the past year? No No Yes No No  Injury with Fall? - - No - -   Depression Screen PHQ 2/9 Scores 12/19/2015 08/13/2015 12/13/2014 09/25/2013  PHQ - 2 Score 0 0 0 0     Cognitive Function MMSE - Mini Mental State Exam 12/19/2015 12/13/2014  Orientation to time 5 5  Orientation to Place 5 5  Registration 3 3  Attention/ Calculation 0 5  Recall 3 3  Language- name 2 objects 0 2  Language- repeat 1 1  Language- follow 3 step command 3 3  Language- read & follow direction 0 1  Write a sentence 0 1  Copy design 0 1  Total score 20 30     PLEASE NOTE: A Mini-Cog screen was completed. Maximum score is 20. A value of 0 denotes this part of Folstein MMSE was not completed or the patient failed this part of the Mini-Cog screening.   Mini-Cog Screening Orientation to Time - Max 5 pts Orientation to Place - Max 5 pts Registration - Max 3 pts Recall - Max 3 pts Language Repeat - Max 1 pts Language Follow 3 Step Command - Max 3 pts     Immunization History  Administered Date(s) Administered  . Influenza Split 11/29/2011  . Influenza, High Dose Seasonal PF 12/13/2014  . Influenza,inj,Quad PF,36+ Mos 12/15/2012, 11/21/2013, 11/13/2015  . Pneumococcal Conjugate-13 03/23/2013  . Pneumococcal Polysaccharide-23 07/22/1998, 12/31/2011   Screening Tests Health Maintenance  Topic Date Due  . DTaP/Tdap/Td (1 - Tdap) 12/18/2016 (Originally 01/27/1954)  . ZOSTAVAX   12/18/2016 (Originally 01/26/1997)  . TETANUS/TDAP  12/18/2016 (Originally 01/27/1954)  . MAMMOGRAM  05/05/2016  . HEMOGLOBIN A1C  05/12/2016  . OPHTHALMOLOGY EXAM  06/12/2016  . FOOT EXAM  11/12/2016  . INFLUENZA VACCINE  Completed  . DEXA SCAN  Completed  . PNA vac Low Risk Adult  Completed      Plan:     I have personally reviewed and addressed the Medicare Annual Wellness questionnaire and have noted the following in the patient's chart:  A. Medical and social history B. Use of alcohol, tobacco or illicit drugs  C. Current medications and supplements D. Functional ability and status E.  Nutritional status F.  Physical activity G. Advance directives H. List of other physicians I.  Hospitalizations, surgeries, and ER visits in previous 12 months J.  [place] to include hearing, vision, cognitive, depression L. Referrals and appointments - none  In addition, I have reviewed and discussed with patient certain preventive protocols, quality metrics, and best practice recommendations. A written personalized care plan for preventive services as well as general preventive health recommendations were provided to patient.  See attached scanned questionnaire for additional information.   Signed,   Lindell Noe, MHA, BS, LPN Health Coach

## 2015-12-19 NOTE — Progress Notes (Signed)
PCP notes:   Health maintenance:  Tetanus - postponed/insurance Shingles - postponed/insurance  Abnormal screenings:   Hearing - failed  Patient concerns:   None  Nurse concerns:  None  Next PCP appt:   12/25/2015 @ 1130

## 2015-12-19 NOTE — Patient Instructions (Signed)
[name] , Thank you for taking time to come for your Medicare Wellness Visit. I appreciate your ongoing commitment to your health goals. Please review the following plan we discussed and let me know if I can assist you in the future.   These are the goals we discussed: Goals    . Increase physical activity          Currently uses the exercise bike x2 days a week for 30 minute sessions.  Add up to 1 day per week for 30 min sessions as tolerated.      . Increase water intake          Currently drinks 3-4 sips of water daily.  Increase water intake up to 3-4 bottle OR 8 cups daily.       This is a list of the screening recommended for you and due dates:  Health Maintenance  Topic Date Due  . DTaP/Tdap/Td vaccine (1 - Tdap) 12/18/2016*  . Shingles Vaccine  12/18/2016*  . Tetanus Vaccine  12/18/2016*  . Mammogram  05/05/2016  . Hemoglobin A1C  05/12/2016  . Eye exam for diabetics  06/12/2016  . Complete foot exam   11/12/2016  . Flu Shot  Completed  . DEXA scan (bone density measurement)  Completed  . Pneumonia vaccines  Completed  *Topic was postponed. The date shown is not the original due date.   Preventive Care for Adults  A healthy lifestyle and preventive care can promote health and wellness. Preventive health guidelines for adults include the following key practices.  . A routine yearly physical is a good way to check with your health care provider about your health and preventive screening. It is a chance to share any concerns and updates on your health and to receive a thorough exam.  . Visit your dentist for a routine exam and preventive care every 6 months. Brush your teeth twice a day and floss once a day. Good oral hygiene prevents tooth decay and gum disease.  . The frequency of eye exams is based on your age, health, family medical history, use  of contact lenses, and other factors. Follow your health care provider's ecommendations for frequency of eye  exams.  . Eat a healthy diet. Foods like vegetables, fruits, whole grains, low-fat dairy products, and lean protein foods contain the nutrients you need without too many calories. Decrease your intake of foods high in solid fats, added sugars, and salt. Eat the right amount of calories for you. Get information about a proper diet from your health care provider, if necessary.  . Regular physical exercise is one of the most important things you can do for your health. Most adults should get at least 150 minutes of moderate-intensity exercise (any activity that increases your heart rate and causes you to sweat) each week. In addition, most adults need muscle-strengthening exercises on 2 or more days a week.  Silver Sneakers may be a benefit available to you. To determine eligibility, you may visit the website: www.silversneakers.com or contact program at (680) 388-8398 Mon-Fri between 8AM-8PM.   . Maintain a healthy weight. The body mass index (BMI) is a screening tool to identify possible weight problems. It provides an estimate of body fat based on height and weight. Your health care provider can find your BMI and can help you achieve or maintain a healthy weight.   For adults 20 years and older: ? A BMI below 18.5 is considered underweight. ? A BMI of 18.5 to 24.9  is normal. ? A BMI of 25 to 29.9 is considered overweight. ? A BMI of 30 and above is considered obese.   . Maintain normal blood lipids and cholesterol levels by exercising and minimizing your intake of saturated fat. Eat a balanced diet with plenty of fruit and vegetables. Blood tests for lipids and cholesterol should begin at age 68 and be repeated every 5 years. If your lipid or cholesterol levels are high, you are over 50, or you are at high risk for heart disease, you may need your cholesterol levels checked more frequently. Ongoing high lipid and cholesterol levels should be treated with medicines if diet and exercise are not  working.  . If you smoke, find out from your health care provider how to quit. If you do not use tobacco, please do not start.  . If you choose to drink alcohol, please do not consume more than 2 drinks per day. One drink is considered to be 12 ounces (355 mL) of beer, 5 ounces (148 mL) of wine, or 1.5 ounces (44 mL) of liquor.  . If you are 79-76 years old, ask your health care provider if you should take aspirin to prevent strokes.  . Use sunscreen. Apply sunscreen liberally and repeatedly throughout the day. You should seek shade when your shadow is shorter than you. Protect yourself by wearing long sleeves, pants, a wide-brimmed hat, and sunglasses year round, whenever you are outdoors.  . Once a month, do a whole body skin exam, using a mirror to look at the skin on your back. Tell your health care provider of new moles, moles that have irregular borders, moles that are larger than a pencil eraser, or moles that have changed in shape or color.

## 2015-12-20 NOTE — Progress Notes (Signed)
I reviewed health advisor's note, was available for consultation, and agree with documentation and plan.  

## 2015-12-25 ENCOUNTER — Encounter: Payer: Self-pay | Admitting: Family Medicine

## 2015-12-25 ENCOUNTER — Ambulatory Visit (INDEPENDENT_AMBULATORY_CARE_PROVIDER_SITE_OTHER): Payer: Medicare Other | Admitting: Family Medicine

## 2015-12-25 VITALS — BP 132/82 | HR 60 | Temp 97.4°F | Wt 171.0 lb

## 2015-12-25 DIAGNOSIS — J309 Allergic rhinitis, unspecified: Secondary | ICD-10-CM

## 2015-12-25 DIAGNOSIS — E1142 Type 2 diabetes mellitus with diabetic polyneuropathy: Secondary | ICD-10-CM

## 2015-12-25 DIAGNOSIS — I10 Essential (primary) hypertension: Secondary | ICD-10-CM | POA: Diagnosis not present

## 2015-12-25 DIAGNOSIS — M8580 Other specified disorders of bone density and structure, unspecified site: Secondary | ICD-10-CM | POA: Diagnosis not present

## 2015-12-25 DIAGNOSIS — Z0000 Encounter for general adult medical examination without abnormal findings: Secondary | ICD-10-CM

## 2015-12-25 DIAGNOSIS — Z7189 Other specified counseling: Secondary | ICD-10-CM | POA: Insufficient documentation

## 2015-12-25 MED ORDER — LEVOCETIRIZINE DIHYDROCHLORIDE 5 MG PO TABS: 5.0000 mg | ORAL_TABLET | Freq: Every evening | ORAL | 11 refills | Status: DC

## 2015-12-25 MED ORDER — LEVOCETIRIZINE DIHYDROCHLORIDE 5 MG PO TABS: 5.0000 mg | ORAL_TABLET | Freq: Every evening | ORAL | 3 refills | Status: DC

## 2015-12-25 MED ORDER — ATENOLOL 50 MG PO TABS: 50.0000 mg | ORAL_TABLET | Freq: Every day | ORAL | 1 refills | Status: DC

## 2015-12-25 NOTE — Progress Notes (Signed)
BP 132/82   Pulse 60   Temp 97.4 F (36.3 C) (Oral)   Wt 171 lb (77.6 kg)   BMI 31.03 kg/m    CC: CPE Subjective:    Patient ID: [name], female    DOB: 1934/05/25, 81 y.o.   MRN: [id number]  HPI: [name] is a 81 y.o. female presenting on 12/25/2015 for Annual Exam   Saw Katha Cabal last week for medicare wellness visit, note reviewed. Failed hearing screen, denies significant trouble.  Requests xyzal refill for allergies.   Preventative: Colonoscopy 2007 - int hem, poor bowel prep, normal barium enema in following (Petrucelli). Discussed, would like cologuard.  Lung cancer screening - not indicated Breast cancer screening - mammo WNL 04/2015  Well woman exam - Last normal pap 2014. Will age out.  DEXA scan 2014 T -1.5 hip osteopenia Flu shot - yearly Tetanus shot - declines prevnar 2015, pneumovax 2013 Shingles shot - declines Advanced directive discussion - does not have available. Packet provided last week.  Seat belt use discussed Sunscreen use discussed. No changing moles on skin.   Lives in [place], from [place]. Daughter lives here. 4 daughters Work - retired Data processing manager Diet - regular Exercise - stationary bike daily at home   Relevant past medical, surgical, family and social history reviewed and updated as indicated. Interim medical history since our last visit reviewed. Allergies and medications reviewed and updated. Current Outpatient Prescriptions on File Prior to Visit  Medication Sig  . aspirin 81 MG tablet Take 81 mg by mouth every Monday, Wednesday, and Friday.   . Blood Glucose Monitoring Suppl (ONE TOUCH ULTRA SYSTEM KIT) W/DEVICE KIT 1 kit by Does not apply route once.  Marland Kitchen glucose blood test strip Use as instructed for One Touch Ultra. Dx: E11.9  . Lancet Device MISC Use as directed  . Lancets Misc. (ACCU-CHEK SOFTCLIX LANCET DEV) KIT Use at home to test blood sugars daily.  Marland Kitchen levothyroxine (SYNTHROID, LEVOTHROID) 112 MCG tablet Take 1 tablet (112  mcg total) by mouth daily.  Marland Kitchen losartan (COZAAR) 50 MG tablet Take 1 tablet by mouth  daily  . montelukast (SINGULAIR) 10 MG tablet Take 1 tablet (10 mg total) by mouth daily.  Glory Rosebush DELICA LANCETS FINE MISC Use as directed to check sugars daily and PRN   No current facility-administered medications on file prior to visit.     Review of Systems  Constitutional: Negative for activity change, appetite change, chills, fatigue, fever and unexpected weight change.  HENT: Positive for congestion (allergies). Negative for hearing loss.   Eyes: Negative for visual disturbance.  Respiratory: Negative for cough, chest tightness, shortness of breath and wheezing.   Cardiovascular: Negative for chest pain, palpitations and leg swelling.  Gastrointestinal: Negative for abdominal distention, abdominal pain, blood in stool, constipation, diarrhea, nausea and vomiting.  Genitourinary: Negative for difficulty urinating and hematuria.  Musculoskeletal: Negative for arthralgias, myalgias and neck pain.  Skin: Negative for rash.  Neurological: Negative for dizziness, seizures, syncope and headaches.  Hematological: Negative for adenopathy. Does not bruise/bleed easily.  Psychiatric/Behavioral: Negative for dysphoric mood. The patient is not nervous/anxious.    Per HPI unless specifically indicated in ROS section     Objective:    BP 132/82   Pulse 60   Temp 97.4 F (36.3 C) (Oral)   Wt 171 lb (77.6 kg)   BMI 31.03 kg/m   Wt Readings from Last 3 Encounters:  12/25/15 171 lb (77.6 kg)  12/19/15 171  lb 4 oz (77.7 kg)  11/13/15 174 lb (78.9 kg)    Physical Exam  Constitutional: She is oriented to person, place, and time. She appears well-developed and well-nourished. No distress.  HENT:  Head: Normocephalic and atraumatic.  Right Ear: Hearing, tympanic membrane, external ear and ear canal normal.  Left Ear: Hearing, tympanic membrane, external ear and ear canal normal.  Nose: Nose normal.    Mouth/Throat: Uvula is midline, oropharynx is clear and moist and mucous membranes are normal. No oropharyngeal exudate, posterior oropharyngeal edema or posterior oropharyngeal erythema.  Eyes: Conjunctivae and EOM are normal. Pupils are equal, round, and reactive to light. No scleral icterus.  Neck: Normal range of motion. Neck supple. No thyromegaly present.  Cardiovascular: Normal rate, regular rhythm, normal heart sounds and intact distal pulses.   No murmur heard. Pulses:      Radial pulses are 2+ on the right side, and 2+ on the left side.  Pulmonary/Chest: Effort normal and breath sounds normal. No respiratory distress. She has no wheezes. She has no rales.  Abdominal: Soft. Bowel sounds are normal. She exhibits no distension and no mass. There is no tenderness. There is no rebound and no guarding.  Musculoskeletal: Normal range of motion. She exhibits no edema.  Lymphadenopathy:    She has no cervical adenopathy.  Neurological: She is alert and oriented to person, place, and time.  CN grossly intact, station and gait intact  Skin: Skin is warm and dry. No rash noted.  Psychiatric: She has a normal mood and affect. Her behavior is normal. Judgment and thought content normal.  Nursing note and vitals reviewed.  Results for orders placed or performed in visit on 11/13/15  Hemoglobin A1c  Result Value Ref Range   Hgb A1c MFr Bld 6.6 (H) 4.6 - 6.5 %  Basic metabolic panel  Result Value Ref Range   Sodium 140 135 - 145 mEq/L   Potassium 4.2 3.5 - 5.1 mEq/L   Chloride 104 96 - 112 mEq/L   CO2 30 19 - 32 mEq/L   Glucose, Bld 97 70 - 99 mg/dL   BUN 18 6 - 23 mg/dL   Creatinine, Ser 0.82 0.40 - 1.20 mg/dL   Calcium 9.4 8.4 - 10.5 mg/dL   GFR 71.51 >60.00 mL/min  Magnesium  Result Value Ref Range   Magnesium 2.0 1.5 - 2.5 mg/dL   Lab Results  Component Value Date   TSH 0.89 08/13/2015    Lab Results  Component Value Date   ALT 34 08/13/2015   AST 20 08/13/2015   ALKPHOS  65 08/13/2015   BILITOT 0.8 08/13/2015       Assessment & Plan:   Problem List Items Addressed This Visit    Advanced care planning/counseling discussion    Advanced directive discussion - does not have available. Packet provided last week.       Allergic rhinitis    Continue singulair, xyzal.       Hypertension    Chronic, stable. Continue current regimen.      Relevant Medications   atenolol (TENORMIN) 50 MG tablet   Osteopenia    Continue regular weight bearing exercise. Will need to review calcium/vit D intake.       Routine general medical examination at a health care facility - Primary    Preventative protocols reviewed and updated unless pt declined. Discussed healthy diet and lifestyle.       Type 2 diabetes, controlled, with peripheral neuropathy ([place])  Chronic, stable diet controlled. Pt endorses poor tolerance to metformin and what sound like sulfonylureas in the past. Not interested in medication at this time. Reviewed recommended low carb diet and active lifestyle           Follow up plan: Return in about 6 months (around 06/23/2016) for follow up visit.  Ria Bush, MD

## 2015-12-25 NOTE — Assessment & Plan Note (Addendum)
Continue regular weight bearing exercise. Will need to review calcium/vit D intake.

## 2015-12-25 NOTE — Progress Notes (Signed)
Pre visit review using our clinic review tool, if applicable. No additional management support is needed unless otherwise documented below in the visit note. 

## 2015-12-25 NOTE — Assessment & Plan Note (Signed)
Preventative protocols reviewed and updated unless pt declined. Discussed healthy diet and lifestyle.  

## 2015-12-25 NOTE — Assessment & Plan Note (Signed)
Chronic, stable. Continue current regimen. 

## 2015-12-25 NOTE — Assessment & Plan Note (Signed)
Advanced directive discussion - does not have available. Packet provided last week.

## 2015-12-25 NOTE — Assessment & Plan Note (Signed)
Chronic, stable diet controlled. Pt endorses poor tolerance to metformin and what sound like sulfonylureas in the past. Not interested in medication at this time. Reviewed recommended low carb diet and active lifestyle

## 2015-12-25 NOTE — Assessment & Plan Note (Signed)
Continue singulair, xyzal.

## 2015-12-25 NOTE — Patient Instructions (Addendum)
We will sign you up for cologuard.  Call your insurace about the shingles shot to see if it is covered or how much it would cost and where is cheaper (here or pharmacy).  If you want to receive here, call for nurse visit.  xyzal refilled para alergias.  Regresar en 6 meses para revisar diabetes de nuevo, regresar antes si puede para examen de sangre.   [place] (Health Maintenance, Female) Un estilo de vida saludable y los cuidados preventivos pueden favorecer considerablemente a la salud y Musician. Pregunte a su mdico cul es el cronograma de exmenes peridicos apropiado para usted. Esta es una buena oportunidad para consultarlo sobre cmo prevenir enfermedades y Sarah Ann sano. Adems de los controles, hay muchas otras cosas que puede hacer usted mismo. Los expertos han realizado numerosas investigaciones [employer] cambios en el estilo de vida y las medidas de prevencin que, [place], lo ayudarn a mantenerse sano. Solicite a su mdico ms informacin. EL PESO Y LA DIETA  Consuma una dieta saludable.  Asegrese de [employer] verduras, frutas, productos lcteos de bajo contenido de [place] y Advertising account planner.  No consuma muchos alimentos de alto contenido de grasas slidas, azcares agregados o sal.  Realice actividad fsica con regularidad. Esta es una de las prcticas ms importantes que puede hacer por su salud.  La mayora de los adultos deben hacer ejercicio durante al menos 146mnutos por semana. El ejercicio debe aumentar la frecuencia cardaca y pActorla transpiracin (ejercicio de [place].  La mayora de los adultos tambin deben hField seismologistejercicios de elongacin al mToysRusveces a la semana. Agregue esto al su plan de ejercicio de intensidad moderada. Mantenga un peso saludable.  El ndice de masa corporal ([facility] es una medida que puede utilizarse para identificar posibles problemas de pBryant Proporciona una estimacin de  la grasa corporal basndose en el peso y la altura. Su mdico puede ayudarle a dRadiation protection practitionerIRiversidey a lScientist, forensico mTheatre managerun peso saludable.  Para las mujeres de 20aos o ms:  Un IMedstar Surgery Center At Brandywinemenor de 18,5 se considera bajo peso.  Un IPenn State Hershey Rehabilitation Hospitalentre 18,5 y 24,9 es normal.  Un IThe Vines Hospitalentre 25 y 29,9 se considera sobrepeso.  Un IMC de 30 o ms se considera obesidad. Observe los niveles de colesterol y lpidos en la sangre.  Debe comenzar a rEnglish as a second language teacherde lpidos y cResearch officer, trade unionen la sangre a los 20aos y luego repetirlos cada 556aos  Es posible que nAutomotive engineerlos niveles de colesterol con mayor frecuencia si:  Sus niveles de lpidos y colesterol son altos.  Es mayor de 50aos.  Presenta un alto riesgo de padecer enfermedades cardacas. DETECCIN DE CNCER  Cncer de pulmn  Se recomienda realizar exmenes de deteccin de cncer de pulmn a personas adultas entre 533y 81aos que estn en riesgo de dHorticulturist, commercialde pulmn por sus antecedentes de consumo de tabaco.  Se recomienda una tomografa computarizada de baja dosis de los pLiberty Mediaaos a las personas que:  Fuman actualmente.  Hayan dejado el hbito en algn momento en los ltimos 15aos.  Hayan fumado durante 30aos un paquete diario. Un paquete-ao equivale a fumar un promedio de un paquete de cigarrillos diario durante un ao.  Los exmenes de deteccin anuales deben continuar hasta que hayan pasado 15aos desde que dej de fumar.  Ya no debern realizarse si tiene un problema de salud que le impida recibir tratamiento para eScience writerde pulmn.  Cncer de mama  Practique la autoconciencia de la mama. Esto significa reconocer la apariencia normal de sus mamas y cmo las siente.  Tambin significa realizar autoexmenes regulares de [employer]. Informe a su mdico sobre cualquier cambio, sin importar cun pequeo sea.  Si tiene entre 20 y 69 aos, un mdico debe realizarle un examen clnico de las mamas  como parte del examen regular de [place], cada 1 a 3aos.  Si tiene 40aos o ms, debe Information systems manager clnico de las Microsoft. Tambin considere realizarse una [place] ([place]) todos los [place].  Si tiene antecedentes familiares de cncer de mama, hable con su mdico para someterse a un estudio gentico.  Si tiene alto riesgo de Chief Financial Officer de mama, hable con su mdico para someterse a Public house manager y [employer].  La evaluacin del gen del cncer de mama (BRCA) se recomienda a mujeres que tengan familiares con cnceres relacionados con el BRCA. Los cnceres relacionados con el BRCA incluyen los siguientes:  Mama.  Ovario.  Trompas.  Cnceres de peritoneo.  Los resultados de la evaluacin determinarn la necesidad de asesoramiento gentico y de [place] de BRCA1 y BRCA2. Cncer de cuello del tero El mdico puede recomendarle que se haga pruebas peridicas de deteccin de cncer de los rganos de la pelvis (ovarios, tero y vagina). Estas pruebas incluyen un examen plvico, que abarca controlar si se produjeron cambios microscpicos en la superficie del cuello del tero (prueba de Papanicolaou). Pueden recomendarle que se haga estas pruebas cada 3aos, a partir de los 21aos.  A las mujeres que tienen entre 30 y 18aos, los mdicos pueden recomendarles que se sometan a exmenes plvicos y pruebas de Papanicolaou cada 7aos, o a la prueba de Papanicolaou y el examen plvico en combinacin con estudios de deteccin del virus del papiloma humano (VPH) cada 5aos. Algunos tipos de VPH aumentan el riesgo de Chief Financial Officer de cuello del tero. La prueba para la deteccin del VPH tambin puede realizarse a mujeres de cualquier edad cuyos resultados de la prueba de Papanicolaou no sean claros.  Es posible que otros mdicos no recomienden exmenes de deteccin a mujeres no embarazadas que se consideran sujetos de bajo riesgo de  Chief Financial Officer de pelvis y que no tienen sntomas. Pregntele al mdico si un examen plvico de deteccin es adecuado para usted.  Si ha recibido un tratamiento para Science writer cervical o una enfermedad que podra causar cncer, necesitar realizarse una prueba de Papanicolaou y controles durante al menos 33 aos de concluido el [place]. Si no se ha hecho el Papanicolaou con regularidad, debern volver a evaluarse los factores de riesgo (como tener un nuevo compaero sexual), para Teacher, adult education si debe realizarse los estudios nuevamente. Algunas mujeres sufren problemas mdicos que aumentan la probabilidad de Museum/gallery curator cncer de cuello del tero. En estos casos, el mdico podr QUALCOMM se realicen controles y pruebas de Papanicolaou con ms frecuencia. Cncer colorrectal  Este tipo de cncer puede detectarse y a menudo prevenirse.  Por lo general, los estudios de rutina se deben Medical laboratory scientific officer a Field seismologist a Proofreader de los 57 aos y [place] 70 aos.  Sin embargo, el mdico podr aconsejarle que lo haga antes, si tiene factores de riesgo para el cncer de colon.  Tambin puede recomendarle que use un kit de prueba para Hydrologist en la materia fecal.  Es posible que se use una pequea cmara en el extremo de un tubo  para examinar directamente el colon (sigmoidoscopia o colonoscopia) a fin de Hydrographic surveyor formas tempranas de cncer colorrectal.  Los exmenes de rutina generalmente comienzan a los 50aos.  El examen directo del colon se debe repetir cada 5 a 10aos hasta los 75aos. Sin embargo, es posible que se realicen exmenes con mayor frecuencia, si se detectan formas tempranas de plipos precancerosos o pequeos bultos. Cncer de piel  Revise la piel de la cabeza a los pies con regularidad.  Informe a su mdico si aparecen nuevos lunares o los que tiene se modifican, especialmente en su forma y color.  Tambin notifique al mdico si tiene un lunar que es ms grande que el tamao de una  goma de lpiz.  Siempre use pantalla solar. Aplique pantalla solar de [place] y repetida a lo largo del Training and development officer.  Protjase usando mangas y [employer], un sombrero de ala ancha y gafas para el sol, siempre que se encuentre en el exterior. ENFERMEDADES CARDACAS, DIABETES E HIPERTENSIN ARTERIAL   La hipertensin arterial causa enfermedades cardacas y [place] el riesgo de ictus. La hipertensin arterial es ms probable en los siguientes casos:  Las personas que tienen la presin arterial en el extremo del rango normal (100-139/85-89 mm Hg).  Las personas con sobrepeso u obesidad.  Las Retail banker.  Si usted tiene entre 18 y 39 aos, debe medirse la presin arterial cada 3 a 5 aos. Si usted tiene 40 aos o ms, debe medirse la presin arterial [employer]lett[employer]ard. Debe medirse la presin arterial dos veces: una vez cuando est en un hospital o una clnica y la otra vez cuando est en otro sitio. Registre el promedio de [employer]. Para controlar su presin arterial cuando no est en un hospital o [place], puede usar lo siguiente:  Ardelia Mems mquina automtica para medir la presin arterial en una farmacia.  Un monitor para medir la presin arterial en el hogar.  Si tiene entre 27 y 15 aos, consulte a su mdico si debe tomar aspirina para prevenir el ictus.  Realcese exmenes de deteccin de la diabetes con regularidad. Esto incluye la toma de [place] de sangre para controlar el nivel de azcar en la sangre durante el [place].  Si tiene un peso normal y un bajo riesgo de padecer diabetes, realcese este anlisis cada tres aos despus de los 45aos.  Si tiene sobrepeso y un alto riesgo de padecer diabetes, considere someterse a este anlisis antes o con mayor frecuencia. PREVENCIN DE INFECCIONES  HepatitisB  Si tiene un riesgo ms alto de Museum/gallery curator hepatitis B, debe someterse a un examen de deteccin de este virus. Se considera que tiene un alto riesgo de  Museum/gallery curator hepatitis B si:  Naci en un pas donde la hepatitis B es frecuente. Pregntele a su mdico qu pases son considerados de Public affairs consultant.  Sus padres nacieron en un pas de alto riesgo y usted no recibi una vacuna que lo proteja contra la hepatitis B (vacuna contra la hepatitis B).  [place].  [place] agujas para inyectarse drogas.  Vive con alguien que tiene hepatitis B.  Ha tenido sexo con alguien que tiene hepatitis B.  Recibe tratamiento de hemodilisis.  Toma ciertos medicamentos para el cncer, trasplante de rganos y afecciones autoinmunitarias. Hepatitis C  Se recomienda un anlisis de [place] para:  Todos los que nacieron entre 1945 y [phone].  Todas las personas que tengan un riesgo de haber contrado hepatitis C. Enfermedades de transmisin sexual (ETS).  Debe realizarse pruebas de deteccin de enfermedades de transmisin sexual (ETS), incluidas gonorrea y clamidia si:  Es sexualmente activo y es menor de 24aos.  Es mayor de 24aos, y Investment banker, operational informa que corre riesgo de tener este tipo de infecciones.  La actividad sexual ha cambiado desde que le hicieron la ltima prueba de deteccin y tiene un riesgo mayor de Best boy clamidia o Radio broadcast assistant. Pregntele al mdico si usted tiene riesgo.  Si no tiene el VIH, pero corre riesgo de infectarse por el virus, se recomienda tomar diariamente un medicamento recetado para evitar la infeccin. Esto se conoce como profilaxis previa a la exposicin. Se considera que est en riesgo si:  Es [place] sexualmente y no [place] preservativos habitualmente o no conoce el estado del VIH de sus Advertising copywriter.  Se inyecta drogas.  Es [place] sexualmente con Ardelia Mems pareja que tiene VIH. Consulte a su mdico para saber si tiene un alto riesgo de infectarse por el VIH. Si opta por comenzar la profilaxis previa a la exposicin, primero debe realizarse anlisis de deteccin del VIH. Luego, le harn anlisis cada 3mses mientras est  tomando los medicamentos para la profilaxis previa a la exposicin.  EP[facility]  Si es premenopusica y puede quedar eLaF[place] solicite a su mdico asesoramiento previo a la concepcin.  Si puede quedar embarazada, tome 400 a [id number]([id number]) de cido fAnheuser-Busch  Si desea evitar el embarazo, hable con su mdico sobre el control de la natalidad (anticoncepcin). OSTEOPOROSIS Y MENOPAUSIA   La osteoporosis es una enfermedad en la que los huesos pierden los minerales y la fuerza por el avance de la edad. El resultado pueden ser fracturas graves en los hHarman El riesgo de osteoporosis puede identificarse con uArdelia Memsprueba de densidad sea.  Si tiene 65aos o ms, o si est en riesgo de sufrir osteoporosis y fracturas, pregunte a su mdico si debe someterse a exmenes.  Consulte a su mdico si debe tomar un suplemento de calcio o de vitamina D para reducir el riesgo de osteoporosis.  La menopausia puede presentar ciertos sntomas fsicos y rGaffer  La terapia de reemplazo hormonal puede reducir algunos de estos sntomas y rGaffer Consulte a su mdico para saber si la terapia de reemplazo hormonal es conveniente para usted.  INSTRUCCIONES PARA EL CUIDADO EN EL HOGAR   Realcese los estudios de rutina de la salud, dentales y de lPublic librarian  [place]  No consuma ningn producto que contenga tabaco, lo que incluye cigarrillos, tabaco de mHigher education careers advisero cPsychologist, sport and exercise  Si est embarazada, no beba alcohol.  Si est amamantando, reduzca el consumo de alcohol y la frecuencia con la que consume.  Si es mujer y no est embarazada limite el consumo de alcohol a no ms de 1 medida por da. Una medida equivale a 12onzas de cerveza, 5onzas de vino o 1onzas de bebidas alcohlicas de alta graduacin.  No consuma drogas.  No comparta agujas.  Solicite ayuda a su mdico si necesita apoyo o informacin para abandonar las drogas.  Informe a su mdico  si a menudo se siente deprimido.  Notifique a su mdico si alguna vez ha sido vctima de abuso o si no se siente seguro en su hogar.   Esta informacin no tiene cMarine scientistel consejo del mdico. Asegrese de hacerle al mdico cualquier pregunta que tenga.   Document Released: 01/21/2011 Document Revised: 02/22/2014 Elsevier Interactive Patient Education 2[employer]

## 2016-01-16 LAB — HM DIABETES EYE EXAM

## 2016-02-07 ENCOUNTER — Telehealth: Payer: Self-pay | Admitting: Family Medicine

## 2016-02-07 NOTE — Telephone Encounter (Signed)
Received notice patient cancelled cologuard.

## 2016-03-02 DIAGNOSIS — H401113 Primary open-angle glaucoma, right eye, severe stage: Secondary | ICD-10-CM | POA: Diagnosis not present

## 2016-04-19 ENCOUNTER — Telehealth: Payer: Self-pay

## 2016-04-19 NOTE — Telephone Encounter (Signed)
Pt request refills for singulair to [facility]; pt should have refills thru 10/2016. Pt will ck with [facility] rx.

## 2016-04-20 ENCOUNTER — Other Ambulatory Visit: Payer: Self-pay | Admitting: Family Medicine

## 2016-05-07 DIAGNOSIS — Q274 Congenital phlebectasia: Secondary | ICD-10-CM | POA: Diagnosis not present

## 2016-05-07 DIAGNOSIS — L299 Pruritus, unspecified: Secondary | ICD-10-CM | POA: Diagnosis not present

## 2016-05-07 DIAGNOSIS — I781 Nevus, non-neoplastic: Secondary | ICD-10-CM | POA: Diagnosis not present

## 2016-05-07 DIAGNOSIS — L821 Other seborrheic keratosis: Secondary | ICD-10-CM | POA: Diagnosis not present

## 2016-06-04 ENCOUNTER — Ambulatory Visit (INDEPENDENT_AMBULATORY_CARE_PROVIDER_SITE_OTHER): Payer: Medicare Other | Admitting: Family Medicine

## 2016-06-04 ENCOUNTER — Encounter: Payer: Self-pay | Admitting: Family Medicine

## 2016-06-04 VITALS — BP 152/84 | HR 64 | Temp 97.9°F | Wt 175.0 lb

## 2016-06-04 DIAGNOSIS — M546 Pain in thoracic spine: Secondary | ICD-10-CM

## 2016-06-04 DIAGNOSIS — M5414 Radiculopathy, thoracic region: Secondary | ICD-10-CM | POA: Insufficient documentation

## 2016-06-04 MED ORDER — GABAPENTIN 100 MG PO CAPS: 100.0000 mg | ORAL_CAPSULE | Freq: Every day | ORAL | 3 refills | Status: DC

## 2016-06-04 NOTE — Assessment & Plan Note (Signed)
Longstanding (years). Anticipate thoracic neuritis/radiculitis. Discussed this. xrays unavailable this afternoon - pt will return Monday for xrays. In interim, rec treat with advil PRN, gabapentin 100-300 at night, heating pad, stretching exercises (provided rhomboid strain exercises).  If no better, consider MRI thoracic spine and PT referral. Pt and husband agree with plan.

## 2016-06-04 NOTE — Progress Notes (Signed)
Pre visit review using our clinic review tool, if applicable. No additional management support is needed unless otherwise documented below in the visit note. 

## 2016-06-04 NOTE — Patient Instructions (Addendum)
Regresar el lunes para rayos X de columna toracica. Almohada caliente a espalda para alivio de dolor gabapentina medicamento para dolor de nervio 100-200mg  de noche.  Aviseme con effecto de medicamento. tambien puede tratar aleve 220mg  dos veces al dia por 1 semana, tomelo con comida.  Ejercicios para espalda.  si no mejora, dejame saber para hablar sobre resonancia magnetica y terapia fisica.

## 2016-06-04 NOTE — Progress Notes (Addendum)
BP (!) 152/84   Pulse 64   Temp 97.9 F (36.6 C) (Oral)   Wt 175 lb (79.4 kg)   SpO2 94%   BMI 31.75 kg/m    CC: R shoulder blade pain Subjective:    Patient ID: [name], female    DOB: 07/27/1934, 82 y.o.   MRN: [id number]  HPI: [name] is a 82 y.o. female presenting on 06/04/2016 for Pruritis (r shoulder)   Longstanding (35 yr hx) itchy spot underneath R shoulder blade.  Has tried vicks vaporub, multiple other OTC creams. SIL brought arnicare gel which was worse.  Improved over the past month, but last week with new pain at R shoulder blade with radiation to R arm. Some dyspnea from chest pain.   No numbness. + tingling.  No rash.  No h/o shingles.   Relevant past medical, surgical, family and social history reviewed and updated as indicated. Interim medical history since our last visit reviewed. Allergies and medications reviewed and updated. Outpatient Medications Prior to Visit  Medication Sig Dispense Refill  . aspirin 81 MG tablet Take 81 mg by mouth every Monday, Wednesday, and Friday.     Marland Kitchen atenolol (TENORMIN) 50 MG tablet TAKE 1 TABLET BY MOUTH  DAILY 90 tablet 1  . Blood Glucose Monitoring Suppl (ONE TOUCH ULTRA SYSTEM KIT) W/DEVICE KIT 1 kit by Does not apply route once. 1 each 0  . glucose blood test strip Use as instructed for One Touch Ultra. Dx: E11.9 100 each 3  . Lancet Device MISC Use as directed 1 each 3  . Lancets Misc. (ACCU-CHEK SOFTCLIX LANCET DEV) KIT Use at home to test blood sugars daily. 1 kit 0  . levocetirizine (XYZAL) 5 MG tablet Take 1 tablet (5 mg total) by mouth every evening. 90 tablet 3  . levothyroxine (SYNTHROID, LEVOTHROID) 112 MCG tablet Take 1 tablet (112 mcg total) by mouth daily. 90 tablet 3  . losartan (COZAAR) 50 MG tablet Take 1 tablet by mouth  daily 90 tablet 3  . montelukast (SINGULAIR) 10 MG tablet Take 1 tablet (10 mg total) by mouth daily. 90 tablet 3  . ONETOUCH DELICA LANCETS FINE MISC Use as directed to  check sugars daily and PRN 100 each 3   No facility-administered medications prior to visit.      Per HPI unless specifically indicated in ROS section below Review of Systems     Objective:    BP (!) 152/84   Pulse 64   Temp 97.9 F (36.6 C) (Oral)   Wt 175 lb (79.4 kg)   SpO2 94%   BMI 31.75 kg/m   Wt Readings from Last 3 Encounters:  06/04/16 175 lb (79.4 kg)  12/25/15 171 lb (77.6 kg)  12/19/15 171 lb 4 oz (77.7 kg)    Physical Exam  Constitutional: She appears well-developed and well-nourished. No distress.  Cardiovascular: Normal rate, regular rhythm and intact distal pulses.   Murmur (faint systolic) heard. Pulmonary/Chest: Effort normal and breath sounds normal. No respiratory distress. She has no wheezes. She has no rales.  Musculoskeletal: She exhibits no edema.  No midline cervical or thoracic spine tenderness  Mild thoracic scoliosis Tender to palpation R lower rhomboids No pain to palpation of scapula. FROM at bilateral shoulders, no pain with rotation of humeral head in [facility] joint  Skin: Skin is warm and dry. No rash noted.  No vesicular rash  Psychiatric: She has a normal mood and affect.  Nursing note and  vitals reviewed.      Assessment & Plan:  Over 25 minutes were spent face-to-face with the patient during this encounter and >50% of that time was spent on counseling and coordination of care  Problem List Items Addressed This Visit    Pain in thoracic spine - Primary    Longstanding (years). Anticipate thoracic neuritis/radiculitis. Discussed this. xrays unavailable this afternoon - pt will return Monday for xrays. In interim, rec treat with advil PRN, gabapentin 100-300 at night, heating pad, stretching exercises (provided rhomboid strain exercises).  If no better, consider MRI thoracic spine and PT referral. Pt and husband agree with plan.       Relevant Orders   DG Thoracic Spine W/Swimmers       Follow up plan: Return if symptoms worsen or  fail to improve.  Ria Bush, MD

## 2016-06-07 ENCOUNTER — Ambulatory Visit (INDEPENDENT_AMBULATORY_CARE_PROVIDER_SITE_OTHER)
Admission: RE | Admit: 2016-06-07 | Discharge: 2016-06-07 | Disposition: A | Discharge status: Home / Self Care | Payer: Medicare Other | Source: Ambulatory Visit | Attending: Family Medicine | Admitting: Family Medicine

## 2016-06-07 DIAGNOSIS — M546 Pain in thoracic spine: Secondary | ICD-10-CM | POA: Diagnosis not present

## 2016-06-07 DIAGNOSIS — R079 Chest pain, unspecified: Secondary | ICD-10-CM | POA: Diagnosis not present

## 2016-06-09 ENCOUNTER — Other Ambulatory Visit: Payer: Self-pay | Admitting: Family Medicine

## 2016-06-19 ENCOUNTER — Other Ambulatory Visit: Payer: Self-pay | Admitting: Family Medicine

## 2016-06-19 DIAGNOSIS — M792 Neuralgia and neuritis, unspecified: Secondary | ICD-10-CM

## 2016-06-19 DIAGNOSIS — E1142 Type 2 diabetes mellitus with diabetic polyneuropathy: Secondary | ICD-10-CM

## 2016-06-19 DIAGNOSIS — E039 Hypothyroidism, unspecified: Secondary | ICD-10-CM

## 2016-06-21 ENCOUNTER — Other Ambulatory Visit (INDEPENDENT_AMBULATORY_CARE_PROVIDER_SITE_OTHER): Payer: Medicare Other

## 2016-06-21 DIAGNOSIS — M792 Neuralgia and neuritis, unspecified: Secondary | ICD-10-CM | POA: Diagnosis not present

## 2016-06-21 DIAGNOSIS — E039 Hypothyroidism, unspecified: Secondary | ICD-10-CM

## 2016-06-21 DIAGNOSIS — E1142 Type 2 diabetes mellitus with diabetic polyneuropathy: Secondary | ICD-10-CM | POA: Diagnosis not present

## 2016-06-21 LAB — BASIC METABOLIC PANEL
BUN: 21 mg/dL (ref 6–23)
CHLORIDE: 105 meq/L (ref 96–112)
CO2: 29 mEq/L (ref 19–32)
Calcium: 9.4 mg/dL (ref 8.4–10.5)
Creatinine, Ser: 0.85 mg/dL (ref 0.40–1.20)
GFR: 68.5 mL/min (ref >60.00)
GLUCOSE: 126 mg/dL — AB (ref 70–99)
POTASSIUM: 4.5 meq/L (ref 3.5–5.1)
Sodium: 140 mEq/L (ref 135–145)

## 2016-06-21 LAB — HEMOGLOBIN A1C: HEMOGLOBIN A1C: 6.8 % — AB (ref 4.6–6.5)

## 2016-06-21 LAB — TSH: TSH: 1.14 u[IU]/mL (ref 0.35–4.50)

## 2016-06-21 LAB — VITAMIN B12: VITAMIN B 12: 314 pg/mL (ref 211–911)

## 2016-06-24 ENCOUNTER — Encounter: Payer: Self-pay | Admitting: Family Medicine

## 2016-06-24 ENCOUNTER — Ambulatory Visit (INDEPENDENT_AMBULATORY_CARE_PROVIDER_SITE_OTHER): Payer: Medicare Other | Admitting: Family Medicine

## 2016-06-24 VITALS — BP 130/70 | HR 56 | Temp 97.8°F | Ht 63.0 in | Wt 175.8 lb

## 2016-06-24 DIAGNOSIS — E1142 Type 2 diabetes mellitus with diabetic polyneuropathy: Secondary | ICD-10-CM

## 2016-06-24 DIAGNOSIS — E538 Deficiency of other specified B group vitamins: Secondary | ICD-10-CM | POA: Insufficient documentation

## 2016-06-24 DIAGNOSIS — Z1211 Encounter for screening for malignant neoplasm of colon: Secondary | ICD-10-CM | POA: Diagnosis not present

## 2016-06-24 DIAGNOSIS — M546 Pain in thoracic spine: Secondary | ICD-10-CM

## 2016-06-24 DIAGNOSIS — I10 Essential (primary) hypertension: Secondary | ICD-10-CM

## 2016-06-24 MED ORDER — CYANOCOBALAMIN 500 MCG PO TABS: 500.0000 ug | ORAL_TABLET | Freq: Every day | ORAL | Status: DC

## 2016-06-24 MED ORDER — ACCU-CHEK SOFTCLIX LANCET DEV KIT: PACK | 0 refills | Status: DC

## 2016-06-24 MED ORDER — LANCET DEVICE MISC: 1 refills | Status: DC

## 2016-06-24 NOTE — Patient Instructions (Addendum)
Gusto verla hoy.  Si tiene neuropatia de diabetes.  Trate de bajar niveles de azucar controlando dulces y Chief of Staff aadida.  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 Regresar en 4 meses para medicare wellness.  We will refer you for colonoscopy.

## 2016-06-24 NOTE — Assessment & Plan Note (Signed)
Chronic, stable today. Pt endorses higher readings at home - will bring home cuff to compare next visit.

## 2016-06-24 NOTE — Assessment & Plan Note (Addendum)
Chronic, adequate control. Evidence of neuropathy on exam. Discussed healthy diet changes to achieve tighter glycemic control.

## 2016-06-24 NOTE — Assessment & Plan Note (Signed)
rec start 500mcg daily OTC.  

## 2016-06-24 NOTE — Progress Notes (Signed)
BP 130/70   Pulse (!) 56   Temp 97.8 F (36.6 C) (Oral)   Ht 5' 3"  (1.6 m)   Wt 175 lb 12.8 oz (79.7 kg)   SpO2 97%   BMI 31.14 kg/m    CC: f/u visit Subjective:    Patient ID: [name], female    DOB: [birth year], 82 y.o.   MRN: [id number]  HPI: [name] is a 82 y.o. female presenting on 06/24/2016 for Follow-up   See prior note for details. Seen here with presumed thoracic neuritis/radiculitis - xrays ok. Treated with gabapentin and advil PRN. We also discussed heating pad use and stretching exercises.   DM - regularly does not check sugars. Compliant with antihyperglycemic regimen which includes: diet controlled. Denies low sugars or hypoglycemic symptoms. Denies paresthesias. Last diabetic eye exam 01/2016.  Pneumovax: 2013.  Prevnar: 2015. Lab Results  Component Value Date   HGBA1C 6.8 (H) 06/21/2016   Diabetic Foot Exam - Simple   Simple Foot Form Diabetic Foot exam was performed with the following findings:  Yes 06/24/2016 12:47 PM  Visual Inspection No deformities, no ulcerations, no other skin breakdown bilaterally:  Yes Sensation Testing See comments:  Yes Pulse Check Posterior Tibialis and Dorsalis pulse intact bilaterally:  Yes Comments Diminished sensation to monofilament Some callusing of medial toes     Requests GI referral to discuss colonoscopy. Decided not to complete cologuard.   Relevant past medical, surgical, family and social history reviewed and updated as indicated. Interim medical history since our last visit reviewed. Allergies and medications reviewed and updated. Outpatient Medications Prior to Visit  Medication Sig Dispense Refill  . aspirin 81 MG tablet Take 81 mg by mouth every Monday, Wednesday, and Friday.     Marland Kitchen atenolol (TENORMIN) 50 MG tablet TAKE 1 TABLET BY MOUTH  DAILY 90 tablet 1  . Blood Glucose Monitoring Suppl (ONE TOUCH ULTRA SYSTEM KIT) W/DEVICE KIT 1 kit by Does not apply route once. 1 each 0  . gabapentin  (NEURONTIN) 100 MG capsule Take 1-2 capsules (100-200 mg total) by mouth at bedtime. 40 capsule 3  . glucose blood (ONE TOUCH ULTRA TEST) test strip Use to check sugar daily. Dx: E11.9 100 each 3  . levothyroxine (SYNTHROID, LEVOTHROID) 112 MCG tablet Take 1 tablet (112 mcg total) by mouth daily. 90 tablet 3  . losartan (COZAAR) 50 MG tablet Take 1 tablet by mouth  daily 90 tablet 3  . montelukast (SINGULAIR) 10 MG tablet Take 1 tablet (10 mg total) by mouth daily. 90 tablet 3  . ONETOUCH DELICA LANCETS FINE MISC Use as directed to check sugars daily and PRN 100 each 3  . Lancet Device MISC Use as directed 1 each 3  . Lancets Misc. (ACCU-CHEK SOFTCLIX LANCET DEV) KIT Use at home to test blood sugars daily. 1 kit 0  . levocetirizine (XYZAL) 5 MG tablet Take 1 tablet (5 mg total) by mouth every evening. 90 tablet 3   No facility-administered medications prior to visit.      Per HPI unless specifically indicated in ROS section below Review of Systems     Objective:    BP 130/70   Pulse (!) 56   Temp 97.8 F (36.6 C) (Oral)   Ht 5' 3"  (1.6 m)   Wt 175 lb 12.8 oz (79.7 kg)   SpO2 97%   BMI 31.14 kg/m   Wt Readings from Last 3 Encounters:  06/24/16 175 lb 12.8 oz (79.7 kg)  06/04/16 175 lb (79.4 kg)  12/25/15 171 lb (77.6 kg)    Physical Exam  Constitutional: She appears well-developed and well-nourished. No distress.  HENT:  Head: Normocephalic and atraumatic.  Right Ear: External ear normal.  Left Ear: External ear normal.  Nose: Nose normal.  Mouth/Throat: Oropharynx is clear and moist. No oropharyngeal exudate.  Eyes: Conjunctivae and EOM are normal. Pupils are equal, round, and reactive to light. No scleral icterus.  Neck: Normal range of motion. Neck supple.  Cardiovascular: Normal rate, regular rhythm, normal heart sounds and intact distal pulses.   No murmur heard. Pulmonary/Chest: Effort normal and breath sounds normal. No respiratory distress. She has no wheezes. She  has no rales.  Musculoskeletal: She exhibits no edema.  See HPI for foot exam if done  Lymphadenopathy:    She has no cervical adenopathy.  Skin: Skin is warm and dry. No rash noted.  Psychiatric: She has a normal mood and affect.  Nursing note and vitals reviewed.  Results for orders placed or performed in visit on 06/24/16  HM DIABETES EYE EXAM  Result Value Ref Range   HM Diabetic Eye Exam No Retinopathy No Retinopathy      Assessment & Plan:   Problem List Items Addressed This Visit    Hypertension    Chronic, stable today. Pt endorses higher readings at home - will bring home cuff to compare next visit.       Low vitamin B12 level    rec start 591mg daily OTC.      Pain in thoracic spine    Significant improvement on gabapentin. Pointing to radiculitis. Pt desires to change gabapentin to PRN.       Type 2 diabetes, controlled, with peripheral neuropathy (HCC) - Primary    Chronic, adequate control. Evidence of neuropathy on exam. Discussed healthy diet changes to achieve tighter glycemic control.       Other Visit Diagnoses    Special screening for malignant neoplasms, colon       Relevant Orders   Ambulatory referral to Gastroenterology       Follow up plan: Return in about 4 months (around 10/25/2016) for follow up visit.  JRia Bush MD

## 2016-06-24 NOTE — Assessment & Plan Note (Signed)
Significant improvement on gabapentin. Pointing to radiculitis. Pt desires to change gabapentin to PRN.

## 2016-07-02 DIAGNOSIS — H401113 Primary open-angle glaucoma, right eye, severe stage: Secondary | ICD-10-CM | POA: Diagnosis not present

## 2016-07-09 ENCOUNTER — Other Ambulatory Visit: Payer: Self-pay | Admitting: Family Medicine

## 2016-07-09 DIAGNOSIS — Z1231 Encounter for screening mammogram for malignant neoplasm of breast: Secondary | ICD-10-CM

## 2016-07-13 ENCOUNTER — Telehealth: Payer: Self-pay

## 2016-07-13 ENCOUNTER — Other Ambulatory Visit: Payer: Self-pay

## 2016-07-13 DIAGNOSIS — Z1211 Encounter for screening for malignant neoplasm of colon: Secondary | ICD-10-CM

## 2016-07-13 NOTE — Telephone Encounter (Signed)
Gastroenterology Pre-Procedure Review  Request Date: 07/22/16 Requesting Physician: Dr. Allen Norris  PATIENT REVIEW QUESTIONS: The patient responded to the following health history questions as indicated:    1. Are you having any GI issues? yes (occasional stomach ache) 2. Do you have a personal history of Polyps? no 3. Do you have a family history of Colon Cancer or Polyps? yes (Grand Father Colon Cancer) 4. Diabetes Mellitus? yes (self) 5. Joint replacements in the past 12 months?no 6. Major health problems in the past 3 months?no 7. Any artificial heart valves, MVP, or defibrillator?no    MEDICATIONS & ALLERGIES:    Patient reports the following regarding taking any anticoagulation/antiplatelet therapy:   Plavix, Coumadin, Eliquis, Xarelto, Lovenox, Pradaxa, Brilinta, or Effient? no Aspirin? no  Patient confirms/reports the following medications:  Current Outpatient Prescriptions  Medication Sig Dispense Refill  . aspirin 81 MG tablet Take 81 mg by mouth every Monday, Wednesday, and Friday.     Marland Kitchen atenolol (TENORMIN) 50 MG tablet TAKE 1 TABLET BY MOUTH  DAILY 90 tablet 1  . Blood Glucose Monitoring Suppl (ONE TOUCH ULTRA SYSTEM KIT) W/DEVICE KIT 1 kit by Does not apply route once. 1 each 0  . cyanocobalamin (V-R VITAMIN B-12) 500 MCG tablet Take 1 tablet (500 mcg total) by mouth daily.    Marland Kitchen gabapentin (NEURONTIN) 100 MG capsule Take 1-2 capsules (100-200 mg total) by mouth at bedtime. 40 capsule 3  . glucose blood (ONE TOUCH ULTRA TEST) test strip Use to check sugar daily. Dx: E11.9 100 each 3  . Lancet Device MISC One touch delica - Use as directed 1 each 1  . Lancets Misc. (ACCU-CHEK SOFTCLIX LANCET DEV) KIT Use at home to test blood sugars daily. 1 kit 0  . levothyroxine (SYNTHROID, LEVOTHROID) 112 MCG tablet Take 1 tablet (112 mcg total) by mouth daily. 90 tablet 3  . losartan (COZAAR) 50 MG tablet Take 1 tablet by mouth  daily 90 tablet 3  . montelukast (SINGULAIR) 10 MG tablet Take  1 tablet (10 mg total) by mouth daily. 90 tablet 3  . ONETOUCH DELICA LANCETS FINE MISC Use as directed to check sugars daily and PRN 100 each 3   No current facility-administered medications for this visit.     Patient confirms/reports the following allergies:  Allergies  Allergen Reactions  . Augmentin [Amoxicillin-Pot Clavulanate]     Possible, had itchy rash  . Cefprozil Swelling  . Metformin And Related Other (See Comments)    Severe stomach pains, muscle aches  . Timolol     Local reaction to eye drops    No orders of the defined types were placed in this encounter.   AUTHORIZATION INFORMATION Primary Insurance: 1D#: Group #:  Secondary Insurance: 1D#: Group #:  SCHEDULE INFORMATION: Date: 07/22/16 Time: Location:[facility]

## 2016-07-14 ENCOUNTER — Telehealth: Payer: Self-pay | Admitting: *Deleted

## 2016-07-14 NOTE — Telephone Encounter (Signed)
-----   Message from [name], [place] sent at 07/14/2016  8:25 AM EDT ----- Pt has been scheduled for screening colonoscopy (Z12.11) with Dr. Allen Norris 07/22/16 at [facility].

## 2016-07-14 NOTE — Telephone Encounter (Signed)
07/14/16 [employer] website prior Josem Kaufmann is NOT required for Screening Colonoscopy Decision ID# [id number].

## 2016-07-15 ENCOUNTER — Encounter: Payer: Self-pay | Admitting: *Deleted

## 2016-07-15 ENCOUNTER — Ambulatory Visit
Admission: RE | Admit: 2016-07-15 | Discharge: 2016-07-15 | Disposition: A | Discharge status: Home / Self Care | Payer: Medicare Other | Source: Ambulatory Visit | Attending: Family Medicine | Admitting: Family Medicine

## 2016-07-15 DIAGNOSIS — Z1231 Encounter for screening mammogram for malignant neoplasm of breast: Secondary | ICD-10-CM | POA: Diagnosis not present

## 2016-07-15 LAB — HM MAMMOGRAPHY

## 2016-07-19 ENCOUNTER — Encounter: Payer: Self-pay | Admitting: *Deleted

## 2016-07-21 NOTE — Discharge Instructions (Signed)
General Anesthesia, Adult, Care After °These instructions provide you with information about caring for yourself after your procedure. Your health care provider may also give you more specific instructions. Your treatment has been planned according to current medical practices, but problems sometimes occur. Call your health care provider if you have any problems or questions after your procedure. °What can I expect after the procedure? °After the procedure, it is common to have: °· Vomiting. °· A sore throat. °· Mental slowness. ° °It is common to feel: °· Nauseous. °· Cold or shivery. °· Sleepy. °· Tired. °· Sore or achy, even in parts of your body where you did not have surgery. ° °Follow these instructions at home: °For at least 24 hours after the procedure: °· Do not: °? Participate in activities where you could fall or become injured. °? Drive. °? Use heavy machinery. °? Drink alcohol. °? Take sleeping pills or medicines that cause drowsiness. °? Make important decisions or sign legal documents. °? Take care of children on your own. °· Rest. °Eating and drinking °· If you vomit, drink water, juice, or soup when you can drink without vomiting. °· Drink enough fluid to keep your urine clear or pale yellow. °· Make sure you have little or no nausea before eating solid foods. °· Follow the diet recommended by your health care provider. °General instructions °· Have a responsible adult stay with you until you are awake and alert. °· Return to your normal activities as told by your health care provider. Ask your health care provider what activities are safe for you. °· Take over-the-counter and prescription medicines only as told by your health care provider. °· If you smoke, do not smoke without supervision. °· Keep all follow-up visits as told by your health care provider. This is important. °Contact a health care provider if: °· You continue to have nausea or vomiting at home, and medicines are not helpful. °· You  cannot drink fluids or start eating again. °· You cannot urinate after 8-12 hours. °· You develop a skin rash. °· You have fever. °· You have increasing redness at the site of your procedure. °Get help right away if: °· You have difficulty breathing. °· You have chest pain. °· You have unexpected bleeding. °· You feel that you are having a life-threatening or urgent problem. °This information is not intended to replace advice given to you by your health care provider. Make sure you discuss any questions you have with your health care provider. °Document Released: 05/10/2000 Document Revised: 07/07/2015 Document Reviewed: 01/16/2015 °Elsevier Interactive Patient Education © 2018 [employer] ° °

## 2016-07-22 ENCOUNTER — Ambulatory Visit: Payer: Medicare Other | Admitting: Anesthesiology

## 2016-07-22 ENCOUNTER — Ambulatory Visit
Admission: RE | Admit: 2016-07-22 | Discharge: 2016-07-22 | Disposition: A | Discharge status: Home / Self Care | Payer: Medicare Other | Source: Ambulatory Visit | Attending: Gastroenterology | Admitting: Gastroenterology

## 2016-07-22 ENCOUNTER — Encounter
Admission: RE | Disposition: A | Discharge status: Home / Self Care | Payer: Self-pay | Source: Ambulatory Visit | Attending: Gastroenterology

## 2016-07-22 DIAGNOSIS — Z1211 Encounter for screening for malignant neoplasm of colon: Secondary | ICD-10-CM | POA: Diagnosis not present

## 2016-07-22 DIAGNOSIS — E119 Type 2 diabetes mellitus without complications: Secondary | ICD-10-CM | POA: Insufficient documentation

## 2016-07-22 DIAGNOSIS — K648 Other hemorrhoids: Secondary | ICD-10-CM | POA: Insufficient documentation

## 2016-07-22 DIAGNOSIS — E039 Hypothyroidism, unspecified: Secondary | ICD-10-CM | POA: Insufficient documentation

## 2016-07-22 DIAGNOSIS — I10 Essential (primary) hypertension: Secondary | ICD-10-CM | POA: Diagnosis not present

## 2016-07-22 DIAGNOSIS — Z7982 Long term (current) use of aspirin: Secondary | ICD-10-CM | POA: Diagnosis not present

## 2016-07-22 DIAGNOSIS — K5730 Diverticulosis of large intestine without perforation or abscess without bleeding: Secondary | ICD-10-CM | POA: Diagnosis not present

## 2016-07-22 DIAGNOSIS — Z79899 Other long term (current) drug therapy: Secondary | ICD-10-CM | POA: Insufficient documentation

## 2016-07-22 HISTORY — PX: COLONOSCOPY WITH PROPOFOL: SHX5780

## 2016-07-22 SURGERY — COLONOSCOPY WITH PROPOFOL
Anesthesia: General | Site: Rectum | Wound class: Dirty or Infected

## 2016-07-22 MED ORDER — PROPOFOL 10 MG/ML IV BOLUS
INTRAVENOUS | Status: DC | PRN
  Administered 2016-07-22: 50 mg via INTRAVENOUS
  Administered 2016-07-22: 100 mg via INTRAVENOUS
  Administered 2016-07-22: 20 mg via INTRAVENOUS

## 2016-07-22 MED ORDER — STERILE WATER FOR IRRIGATION IR SOLN
Status: DC | PRN
  Administered 2016-07-22: 08:00:00

## 2016-07-22 MED ORDER — LACTATED RINGERS IV SOLN
INTRAVENOUS | Status: DC
  Administered 2016-07-22: 08:00:00 via INTRAVENOUS

## 2016-07-22 MED ORDER — ACETAMINOPHEN 325 MG PO TABS: 325.0000 mg | ORAL_TABLET | ORAL | Status: DC | PRN

## 2016-07-22 MED ORDER — LIDOCAINE HCL (CARDIAC) 20 MG/ML IV SOLN
INTRAVENOUS | Status: DC | PRN
  Administered 2016-07-22: 50 mg via INTRAVENOUS

## 2016-07-22 MED ORDER — ACETAMINOPHEN 160 MG/5ML PO SOLN: 325.0000 mg | ORAL | Status: DC | PRN

## 2016-07-22 MED ORDER — SODIUM CHLORIDE 0.9 % IV SOLN: INTRAVENOUS | Status: DC

## 2016-07-22 SURGICAL SUPPLY — 23 items
CANISTER SUCT 1200ML W/VALVE (MISCELLANEOUS) ×3 IMPLANT
CLIP HMST 235XBRD CATH ROT (MISCELLANEOUS) IMPLANT
CLIP RESOLUTION 360 11X235 (MISCELLANEOUS)
FCP ESCP3.2XJMB 240X2.8X (MISCELLANEOUS)
FORCEPS BIOP RAD 4 LRG CAP 4 (CUTTING FORCEPS) IMPLANT
FORCEPS BIOP RJ4 240 W/NDL (MISCELLANEOUS)
FORCEPS ESCP3.2XJMB 240X2.8X (MISCELLANEOUS) IMPLANT
GOWN CVR UNV OPN BCK APRN NK (MISCELLANEOUS) ×2 IMPLANT
GOWN ISOL THUMB LOOP REG UNIV (MISCELLANEOUS) ×4
INJECTOR VARIJECT VIN23 (MISCELLANEOUS) IMPLANT
KIT DEFENDO VALVE AND CONN (KITS) IMPLANT
KIT ENDO PROCEDURE OLY (KITS) ×3 IMPLANT
MARKER SPOT ENDO TATTOO 5ML (MISCELLANEOUS) IMPLANT
PAD GROUND ADULT SPLIT (MISCELLANEOUS) IMPLANT
PROBE APC STR FIRE (PROBE) IMPLANT
RETRIEVER NET ROTH 2.5X230 LF (MISCELLANEOUS) IMPLANT
SNARE SHORT THROW 13M SML OVAL (MISCELLANEOUS) IMPLANT
SNARE SHORT THROW 30M LRG OVAL (MISCELLANEOUS) IMPLANT
SNARE SNG USE RND 15MM (INSTRUMENTS) IMPLANT
SPOT EX ENDOSCOPIC TATTOO (MISCELLANEOUS)
TRAP ETRAP POLY (MISCELLANEOUS) IMPLANT
VARIJECT INJECTOR VIN23 (MISCELLANEOUS)
WATER STERILE IRR 250ML POUR (IV SOLUTION) ×3 IMPLANT

## 2016-07-22 NOTE — Transfer of Care (Signed)
Immediate Anesthesia Transfer of Care Note  Patient: [name]  Procedure(s) Performed: Procedure(s) with comments: COLONOSCOPY WITH PROPOFOL (N/A) - diabetic - diet controlled  Patient Location: PACU  Anesthesia Type: General  Level of Consciousness: awake, alert  and patient cooperative  Airway and Oxygen Therapy: Patient Spontanous Breathing and Patient connected to supplemental oxygen  Post-op Assessment: Post-op Vital signs reviewed, Patient's Cardiovascular Status Stable, Respiratory Function Stable, Patent Airway and No signs of Nausea or vomiting  Post-op Vital Signs: Reviewed and stable  Complications: No apparent anesthesia complications

## 2016-07-22 NOTE — Op Note (Signed)
[facility] Gastroenterology Patient Name: [name] Procedure Date: 07/22/2016 8:19 AM MRN: [id number] Account #: [id number] Date of Birth: 10-Nov-1934 Admit Type: Outpatient Age: 82 Room: [facility] OR ROOM 01 Gender: Female Note Status: Finalized Procedure:            Colonoscopy Indications:          Screening for colorectal malignant neoplasm Providers:            Lucilla Lame MD, MD Referring MD:         Ria Bush (Referring MD) Medicines:            Propofol per Anesthesia Complications:        No immediate complications. Procedure:            Pre-Anesthesia Assessment:                       - Prior to the procedure, a History and Physical was                        performed, and patient medications and allergies were                        reviewed. The patient's tolerance of previous                        anesthesia was also reviewed. The risks and benefits of                        the procedure and the sedation options and risks were                        discussed with the patient. All questions were                        answered, and informed consent was obtained. Prior                        Anticoagulants: The patient has taken no previous                        anticoagulant or antiplatelet agents. ASA Grade                        Assessment: II - A patient with mild systemic disease.                        After reviewing the risks and benefits, the patient was                        deemed in satisfactory condition to undergo the                        procedure.                       After obtaining informed consent, the colonoscope was                        passed under direct vision. Throughout the procedure,  the patient's blood pressure, pulse, and oxygen                        saturations were monitored continuously. The Penermon [id number]) was introduced through the                     anus and advanced to the the terminal ileum. The                        colonoscopy was performed without difficulty. The                        patient tolerated the procedure well. The quality of                        the bowel preparation was excellent. Findings:      The perianal and digital rectal examinations were normal.      A few small-mouthed diverticula were found in the sigmoid colon.      Non-bleeding internal hemorrhoids were found during retroflexion. The       hemorrhoids were Grade I (internal hemorrhoids that do not prolapse). Impression:           - Diverticulosis in the sigmoid colon.                       - Non-bleeding internal hemorrhoids.                       - No specimens collected. Recommendation:       - Discharge patient to home.                       - Resume previous diet.                       - Continue present medications. Procedure Code(s):    --- Professional ---                       [phone], Colonoscopy, flexible; diagnostic, including                        collection of specimen(s) by brushing or washing, when                        performed (separate procedure) Diagnosis Code(s):    --- Professional ---                       Z12.11, Encounter for screening for malignant neoplasm                        of colon CPT copyright 2016 American Medical Association. All rights reserved. The codes documented in this report are preliminary and upon coder review may  be revised to meet current compliance requirements. Lucilla Lame MD, MD 07/22/2016 8:33:33 AM This report has been signed electronically. Number of Addenda: 0 Note Initiated On: 07/22/2016 8:19 AM Scope Withdrawal Time: 0 hours 6 minutes 30 seconds  Total Procedure Duration: 0 hours 8 minutes 36 seconds  [facility]

## 2016-07-22 NOTE — Anesthesia Postprocedure Evaluation (Signed)
Anesthesia Post Note  Patient: [name]  Procedure(s) Performed: Procedure(s) (LRB): COLONOSCOPY WITH PROPOFOL (N/A)  Patient location during evaluation: PACU Anesthesia Type: General Level of consciousness: awake and alert and oriented Pain management: satisfactory to patient Vital Signs Assessment: post-procedure vital signs reviewed and stable Respiratory status: spontaneous breathing, nonlabored ventilation and respiratory function stable Cardiovascular status: blood pressure returned to baseline and stable Postop Assessment: Adequate PO intake and No signs of nausea or vomiting Anesthetic complications: no    Raliegh Ip

## 2016-07-22 NOTE — Anesthesia Preprocedure Evaluation (Signed)
Anesthesia Evaluation  Patient identified by MRN, date of birth, ID band Patient awake    Reviewed: Allergy & Precautions, H&P , NPO status , Patient's Chart, lab work & pertinent test results  Airway Mallampati: II  TM Distance: >3 FB Neck ROM: full    Dental no notable dental hx.    Pulmonary    Pulmonary exam normal        Cardiovascular hypertension, Normal cardiovascular exam     Neuro/Psych    GI/Hepatic   Endo/Other  diabetesHypothyroidism   Renal/GU      Musculoskeletal   Abdominal   Peds  Hematology   Anesthesia Other Findings   Reproductive/Obstetrics                             Anesthesia Physical Anesthesia Plan  ASA: II  Anesthesia Plan: General   Post-op Pain Management:    Induction:   PONV Risk Score and Plan: 3 and Propofol  Airway Management Planned:   Additional Equipment:   Intra-op Plan:   Post-operative Plan:   Informed Consent: I have reviewed the patients History and Physical, chart, labs and discussed the procedure including the risks, benefits and alternatives for the proposed anesthesia with the patient or authorized representative who has indicated his/her understanding and acceptance.     Plan Discussed with:   Anesthesia Plan Comments:         Anesthesia Quick Evaluation

## 2016-07-22 NOTE — H&P (Signed)
Lucilla Lame, MD [facility] [address] [place], [place] [address] Phone: [phone] Fax : [phone]  Primary Care Physician:  Ria Bush, MD Primary Gastroenterologist:  Dr. Allen Norris  Pre-Procedure History & Physical: HPI:  [name] is a 82 y.o. female is here for a screening colonoscopy.   Past Medical History:  Diagnosis Date  . Allergy    hay fever  . Arthritis   . Chicken pox   . Colon polyp   . Diabetes mellitus 2008  . Generalized headaches    thinks caused by glaucoma  . Glaucoma   . Hypertension   . Right sided sciatica 07/03/2013  . Thyroid disease    hypothyroidism  . Urinary incontinence   . UTI (urinary tract infection)     Past Surgical History:  Procedure Laterality Date  . APPENDECTOMY  1998  . BUNIONECTOMY    . CHOLECYSTECTOMY    . COLONOSCOPY  2007   int hem, poor bowel prep, normal barium enema in following ([place])  . EYE SURGERY     R  eye-lid drop surgery  . TONSILLECTOMY      Prior to Admission medications   Medication Sig Start Date End Date Taking? Authorizing Provider  aspirin 81 MG tablet Take 81 mg by mouth every Monday, Wednesday, and Friday.    Yes [provider]  atenolol (TENORMIN) 50 MG tablet TAKE 1 TABLET BY MOUTH  DAILY 04/21/16  Yes Ria Bush, MD  Blood Glucose Monitoring Suppl (ONE TOUCH ULTRA SYSTEM KIT) W/DEVICE KIT 1 kit by Does not apply route once. 12/27/13  Yes Jackolyn Confer, MD  cyanocobalamin (V-R VITAMIN B-12) 500 MCG tablet Take 1 tablet (500 mcg total) by mouth daily. 06/24/16  Yes Ria Bush, MD  glucose blood (ONE TOUCH ULTRA TEST) test strip Use to check sugar daily. Dx: E11.9 06/09/16  Yes Ria Bush, MD  Lancet Device MISC One touch Donaciano Eva - Use as directed 06/24/16  Yes Ria Bush, MD  Lancets Misc. (ACCU-CHEK SOFTCLIX LANCET DEV) KIT Use at home to test blood sugars daily. 06/24/16  Yes Ria Bush, MD  levothyroxine (SYNTHROID, LEVOTHROID)  112 MCG tablet Take 1 tablet (112 mcg total) by mouth daily. 11/13/15  Yes Ria Bush, MD  losartan (COZAAR) 50 MG tablet Take 1 tablet by mouth  daily 11/13/15  Yes Ria Bush, MD  montelukast (SINGULAIR) 10 MG tablet Take 1 tablet (10 mg total) by mouth daily. 11/13/15  Yes Ria Bush, MD  Multiple Vitamins-Minerals (HAIR SKIN AND NAILS FORMULA PO) Take by mouth daily.   Yes [provider]  Jonetta Speak LANCETS FINE MISC Use as directed to check sugars daily and PRN 11/13/15  Yes Ria Bush, MD  gabapentin (NEURONTIN) 100 MG capsule Take 1-2 capsules (100-200 mg total) by mouth at bedtime. Patient not taking: Reported on 07/15/2016 06/04/16   Ria Bush, MD    Allergies as of 07/13/2016 - Review Complete 06/24/2016  Allergen Reaction Noted  . Augmentin [amoxicillin-pot clavulanate]  06/13/2014  . Cefprozil Swelling 07/22/2011  . Metformin and related Other (See Comments) 12/31/2011  . Timolol  03/13/2014    Family History  Problem Relation Age of Onset  . Heart disease Mother   . Asthma Mother   . Heart attack Mother 26  . Heart disease Father   . Deep vein thrombosis Father   . Leukemia Brother   . Cancer Brother   . Diabetes Sister   . Crohn's disease Brother   . Diabetes  Brother   . Arthritis Other   . Diabetes Other   . Cancer Maternal Aunt        ovarian?  . Cancer Maternal Grandfather        colon    Social History   Social History  . Marital status: Married    Spouse name: N/A  . Number of children: N/A  . Years of education: N/A   Occupational History  . Not on file.   Social History Main Topics  . Smoking status: Never Smoker  . Smokeless tobacco: Never Used  . Alcohol use 1.2 oz/week    2 Glasses of wine per week     Comment: occasional  . Drug use: No  . Sexual activity: Yes   Other Topics Concern  . Not on file   Social History Narrative   Lives in [place], from [place]. Daughter lives here. 4 daughters.       Work - retired Data processing manager   Diet - regular   Exercise - bike daily at home    Review of Systems: See HPI, otherwise negative ROS  Physical Exam: BP 133/75   Pulse 64   Temp 98.8 F (37.1 C) (Temporal)   Resp 16   Ht 5' 3"  (1.6 m)   Wt 168 lb (76.2 kg)   SpO2 100%   BMI 29.76 kg/m  General:   Alert,  pleasant and cooperative in NAD Head:  Normocephalic and atraumatic. Neck:  Supple; no masses or thyromegaly. Lungs:  Clear throughout to auscultation.    Heart:  Regular rate and rhythm. Abdomen:  Soft, nontender and nondistended. Normal bowel sounds, without guarding, and without rebound.   Neurologic:  Alert and  oriented x4;  grossly normal neurologically.  Impression/Plan: [name] is now here to undergo a screening colonoscopy.  Risks, benefits, and alternatives regarding colonoscopy have been reviewed with the patient.  Questions have been answered.  All parties agreeable.

## 2016-07-22 NOTE — Anesthesia Procedure Notes (Addendum)
Performed by: Laytoya Ion Pre-anesthesia Checklist: Patient identified, Emergency Drugs available, Suction available, Timeout performed and Patient being monitored Patient Re-evaluated:Patient Re-evaluated prior to induction Oxygen Delivery Method: Nasal cannula Placement Confirmation: positive ETCO2       

## 2016-07-30 ENCOUNTER — Encounter: Payer: Self-pay | Admitting: Gastroenterology

## 2016-07-30 NOTE — Addendum Note (Signed)
Addendum  created 07/30/16 0716 by Londell Moh, CRNA   Anesthesia Intra Blocks edited, Sign clinical note

## 2016-08-11 ENCOUNTER — Other Ambulatory Visit: Payer: Self-pay | Admitting: Family Medicine

## 2016-08-17 ENCOUNTER — Other Ambulatory Visit: Payer: Self-pay | Admitting: Family Medicine

## 2016-10-04 ENCOUNTER — Other Ambulatory Visit: Payer: Self-pay | Admitting: Family Medicine

## 2016-10-24 ENCOUNTER — Other Ambulatory Visit: Payer: Self-pay | Admitting: Family Medicine

## 2016-10-24 DIAGNOSIS — E538 Deficiency of other specified B group vitamins: Secondary | ICD-10-CM

## 2016-10-24 DIAGNOSIS — E1142 Type 2 diabetes mellitus with diabetic polyneuropathy: Secondary | ICD-10-CM

## 2016-10-25 ENCOUNTER — Other Ambulatory Visit (INDEPENDENT_AMBULATORY_CARE_PROVIDER_SITE_OTHER): Payer: Medicare Other

## 2016-10-25 DIAGNOSIS — E538 Deficiency of other specified B group vitamins: Secondary | ICD-10-CM | POA: Diagnosis not present

## 2016-10-25 DIAGNOSIS — E1142 Type 2 diabetes mellitus with diabetic polyneuropathy: Secondary | ICD-10-CM

## 2016-10-25 LAB — VITAMIN B12: Vitamin B-12: 531 pg/mL (ref 211–911)

## 2016-10-25 LAB — HEMOGLOBIN A1C: Hgb A1c MFr Bld: 6.6 % — ABNORMAL HIGH (ref 4.6–6.5)

## 2016-10-26 ENCOUNTER — Encounter: Payer: Self-pay | Admitting: Family Medicine

## 2016-10-26 ENCOUNTER — Ambulatory Visit (INDEPENDENT_AMBULATORY_CARE_PROVIDER_SITE_OTHER): Payer: Medicare Other | Admitting: Family Medicine

## 2016-10-26 VITALS — BP 120/82 | HR 64 | Temp 97.6°F | Wt 173.0 lb

## 2016-10-26 DIAGNOSIS — E1142 Type 2 diabetes mellitus with diabetic polyneuropathy: Secondary | ICD-10-CM | POA: Diagnosis not present

## 2016-10-26 DIAGNOSIS — J309 Allergic rhinitis, unspecified: Secondary | ICD-10-CM | POA: Diagnosis not present

## 2016-10-26 DIAGNOSIS — E538 Deficiency of other specified B group vitamins: Secondary | ICD-10-CM

## 2016-10-26 DIAGNOSIS — Z23 Encounter for immunization: Secondary | ICD-10-CM | POA: Diagnosis not present

## 2016-10-26 MED ORDER — GLUCOSE BLOOD VI STRP: ORAL_STRIP | 3 refills | Status: DC

## 2016-10-26 MED ORDER — MONTELUKAST SODIUM 10 MG PO TABS: 10.0000 mg | ORAL_TABLET | Freq: Every day | ORAL | 1 refills | Status: DC

## 2016-10-26 MED ORDER — MUPIROCIN 2 % EX OINT: 1.0000 "application " | TOPICAL_OINTMENT | Freq: Two times a day (BID) | CUTANEOUS | 0 refills | Status: DC

## 2016-10-26 MED ORDER — ATENOLOL 50 MG PO TABS: 50.0000 mg | ORAL_TABLET | Freq: Every day | ORAL | 1 refills | Status: DC

## 2016-10-26 MED ORDER — LOSARTAN POTASSIUM 50 MG PO TABS: 50.0000 mg | ORAL_TABLET | Freq: Every day | ORAL | 1 refills | Status: DC

## 2016-10-26 MED ORDER — LEVOTHYROXINE SODIUM 112 MCG PO TABS: 112.0000 ug | ORAL_TABLET | Freq: Every day | ORAL | 1 refills | Status: DC

## 2016-10-26 NOTE — Assessment & Plan Note (Signed)
Chronic, stable. Continue current regimen diet controlled.

## 2016-10-26 NOTE — Patient Instructions (Addendum)
Vacuna contra flu hoy. Su diabetes sigue muy bien controlado!  Regresar en 3 meses para medicare wellness visit y fisico.

## 2016-10-26 NOTE — Assessment & Plan Note (Signed)
She thinks she is allergic to local tree near our clinic. Continues singulair daily. Discussed PRN antihistamine on days she comes in to clinic.

## 2016-10-26 NOTE — Assessment & Plan Note (Signed)
Repleting with vit b12 520mcg daily. Levels have improved.

## 2016-10-26 NOTE — Progress Notes (Signed)
BP 120/82 (BP Location: Left Arm, Patient Position: Sitting, Cuff Size: Normal)   Pulse 64   Temp 97.6 F (36.4 C) (Oral)   Wt 173 lb (78.5 kg)   SpO2 95%   BMI 30.65 kg/m    CC: 4 mo f/u visit Subjective:    Patient ID: [name], female    DOB: 22-Oct-1934, 82 y.o.   MRN: [id number]  HPI: [name] is a 82 y.o. female presenting on 10/26/2016 for 4 mo DM follow-up   She thinks roundup was causing GI distress.   DM - regularly does not check sugars. Compliant with antihyperglycemic regimen which includes: diet controlled.  Denies low sugars or hypoglycemic symptoms.  Denies paresthesias. Last diabetic eye exam 01/2016.  Pneumovax: 2013.  Prevnar: 2015. Exercising daily.  Lab Results  Component Value Date   HGBA1C 6.6 (H) 10/25/2016   Diabetic Foot Exam - Simple   No data filed       Colonoscopy WNL 07/2016.   Requests mupirocin ointment refill for red patch on upper lip  Relevant past medical, surgical, family and social history reviewed and updated as indicated. Interim medical history since our last visit reviewed. Allergies and medications reviewed and updated. Outpatient Medications Prior to Visit  Medication Sig Dispense Refill  . aspirin 81 MG tablet Take 81 mg by mouth every Monday, Wednesday, and Friday.     . Blood Glucose Monitoring Suppl (ONE TOUCH ULTRA SYSTEM KIT) W/DEVICE KIT 1 kit by Does not apply route once. 1 each 0  . cyanocobalamin (V-R VITAMIN B-12) 500 MCG tablet Take 1 tablet (500 mcg total) by mouth daily.    Elmore Guise Device MISC One touch delica - Use as directed 1 each 1  . Lancets Misc. (ACCU-CHEK SOFTCLIX LANCET DEV) KIT Use at home to test blood sugars daily. 1 kit 0  . Multiple Vitamins-Minerals (HAIR SKIN AND NAILS FORMULA PO) Take by mouth daily.    Glory Rosebush DELICA LANCETS FINE MISC Use 1-2 times daily as needed 100 each 6  . atenolol (TENORMIN) 50 MG tablet TAKE 1 TABLET BY MOUTH  DAILY 90 tablet 0  . gabapentin (NEURONTIN)  100 MG capsule Take 1-2 capsules (100-200 mg total) by mouth at bedtime. (Patient not taking: Reported on 07/15/2016) 40 capsule 3  . glucose blood (ONE TOUCH ULTRA TEST) test strip Use to check sugar daily. Dx: E11.9 100 each 3  . levothyroxine (SYNTHROID, LEVOTHROID) 112 MCG tablet Take 1 tablet (112 mcg total) by mouth daily. 90 tablet 3  . losartan (COZAAR) 50 MG tablet Take 1 tablet by mouth  daily 90 tablet 3  . montelukast (SINGULAIR) 10 MG tablet Take 1 tablet (10 mg total) by mouth daily. 90 tablet 3   No facility-administered medications prior to visit.      Per HPI unless specifically indicated in ROS section below Review of Systems     Objective:    BP 120/82 (BP Location: Left Arm, Patient Position: Sitting, Cuff Size: Normal)   Pulse 64   Temp 97.6 F (36.4 C) (Oral)   Wt 173 lb (78.5 kg)   SpO2 95%   BMI 30.65 kg/m   Wt Readings from Last 3 Encounters:  10/26/16 173 lb (78.5 kg)  07/22/16 168 lb (76.2 kg)  06/24/16 175 lb 12.8 oz (79.7 kg)    Physical Exam  Constitutional: She appears well-developed and well-nourished. No distress.  HENT:  Head: Normocephalic and atraumatic.  Right Ear: External ear normal.  Left Ear: External ear normal.  Nose: Nose normal.  Mouth/Throat: Oropharynx is clear and moist. No oropharyngeal exudate.  Eyes: Pupils are equal, round, and reactive to light. Conjunctivae and EOM are normal. No scleral icterus.  Neck: Normal range of motion. Neck supple.  Cardiovascular: Normal rate, regular rhythm, normal heart sounds and intact distal pulses.   No murmur heard. Pulmonary/Chest: Effort normal and breath sounds normal. No respiratory distress. She has no wheezes. She has no rales.  Musculoskeletal: She exhibits no edema.  See HPI for foot exam if done  Lymphadenopathy:    She has no cervical adenopathy.  Skin: Skin is warm and dry. No rash noted.  Erythematous patch at philtrum near L upper lip  Psychiatric: She has a normal mood  and affect.  Nursing note and vitals reviewed.  Results for orders placed or performed in visit on 10/25/16  Hemoglobin A1c  Result Value Ref Range   Hgb A1c MFr Bld 6.6 (H) 4.6 - 6.5 %  Vitamin B12  Result Value Ref Range   Vitamin B-12 531 211 - 911 pg/mL   Lab Results  Component Value Date   TSH 1.14 06/21/2016       Assessment & Plan:   Problem List Items Addressed This Visit    Allergic rhinitis    She thinks she is allergic to local tree near our clinic. Continues singulair daily. Discussed PRN antihistamine on days she comes in to clinic.       Low vitamin B12 level    Repleting with vit b12 556mg daily. Levels have improved.      Type 2 diabetes, controlled, with peripheral neuropathy (HCC) - Primary    Chronic, stable. Continue current regimen diet controlled.       Relevant Medications   losartan (COZAAR) 50 MG tablet    Other Visit Diagnoses    Need for influenza vaccination       Relevant Orders   Flu Vaccine QUAD 6+ mos PF IM (Fluarix Quad PF) (Completed)       Follow up plan: Return in about 3 months (around 01/25/2017).  JRia Bush MD

## 2016-10-30 IMAGING — MG MM DIGITAL SCREENING BILAT W/ TOMO W/ CAD
8 of 12 series · 8 of 28 positions shown · non-contrast
Comparison: Previous exam(s).

CLINICAL DATA: Screening.

EXAM:
DIGITAL SCREENING BILATERAL MAMMOGRAM WITH 3D TOMO WITH CAD

[L CC]
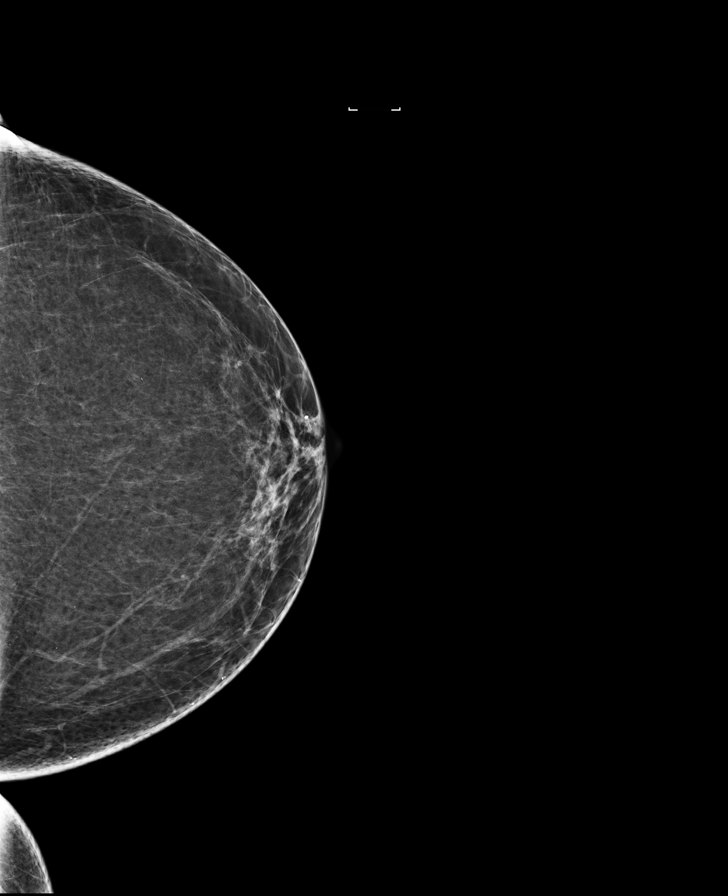

[R MLO]
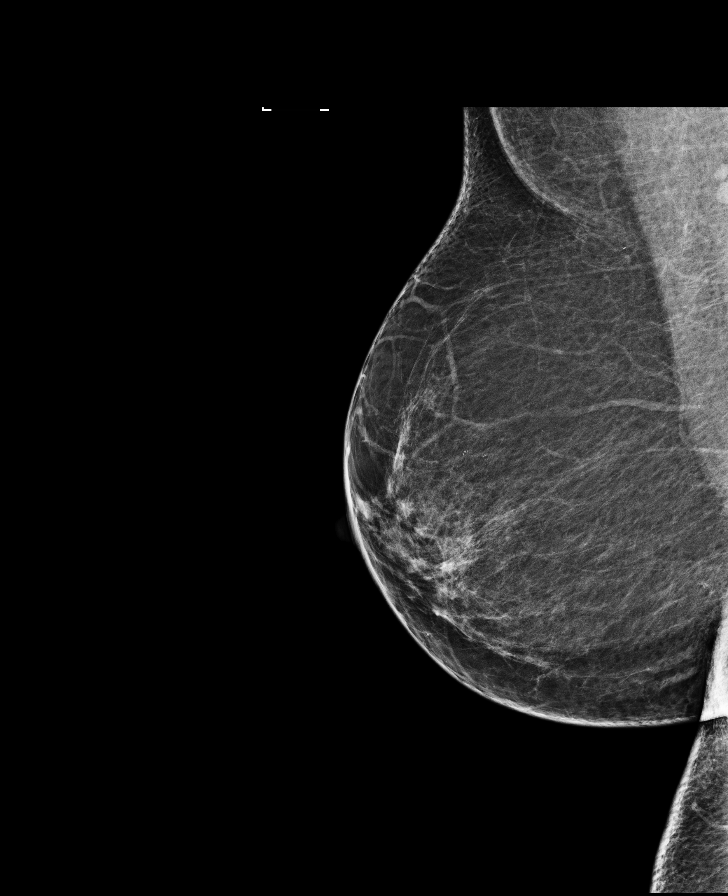

[R CC synth-2D]
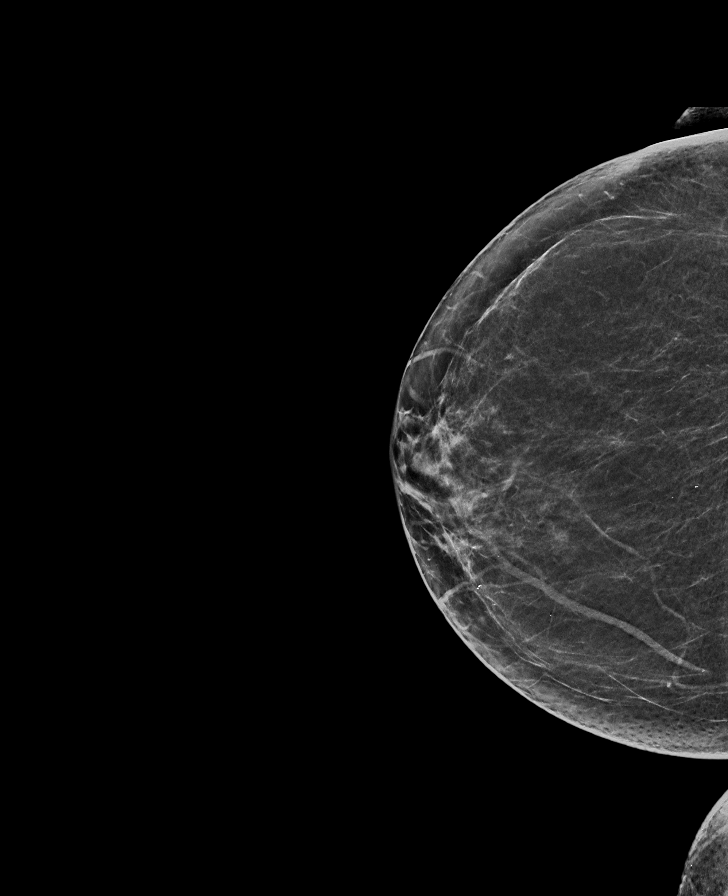

[L CC synth-2D]
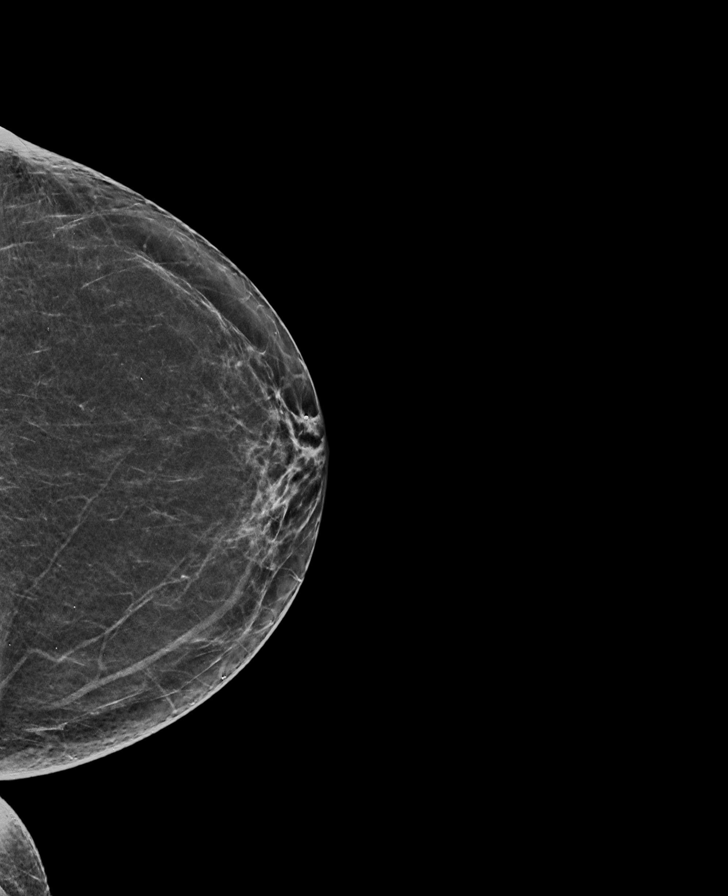

[L MLO]
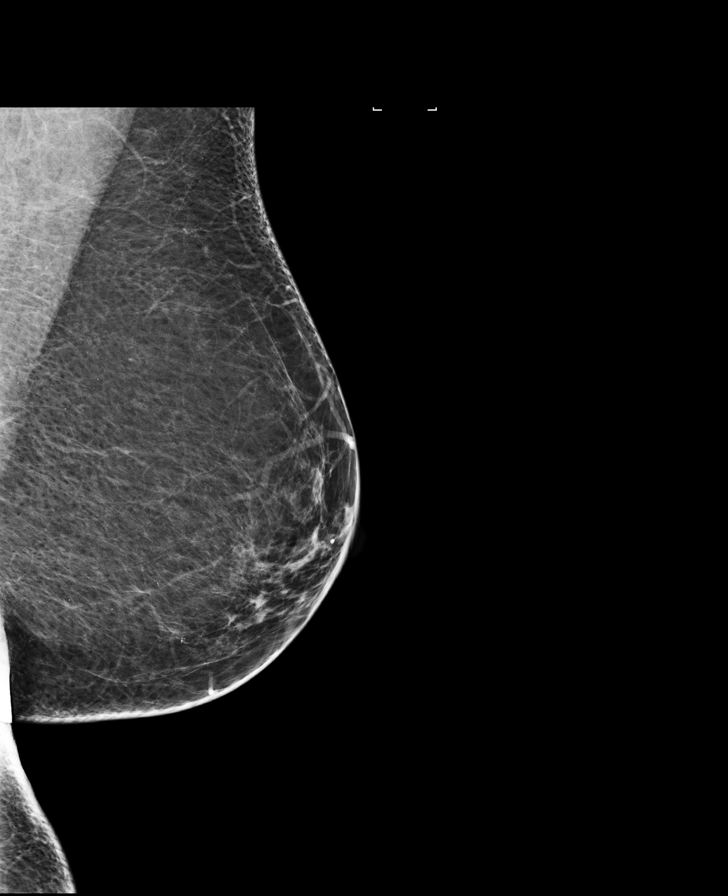

[L MLO synth-2D]
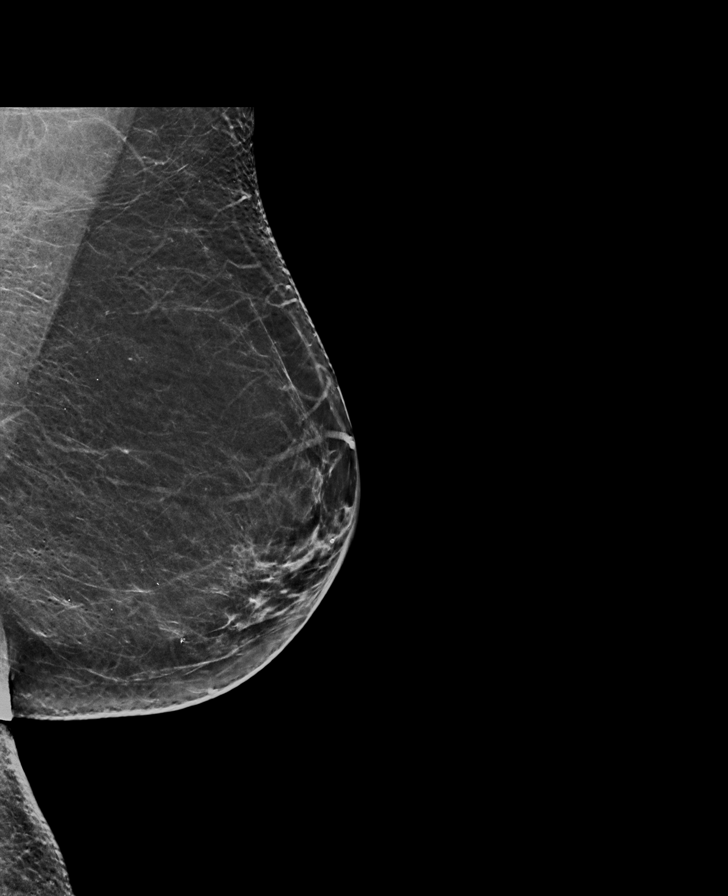

[R CC]
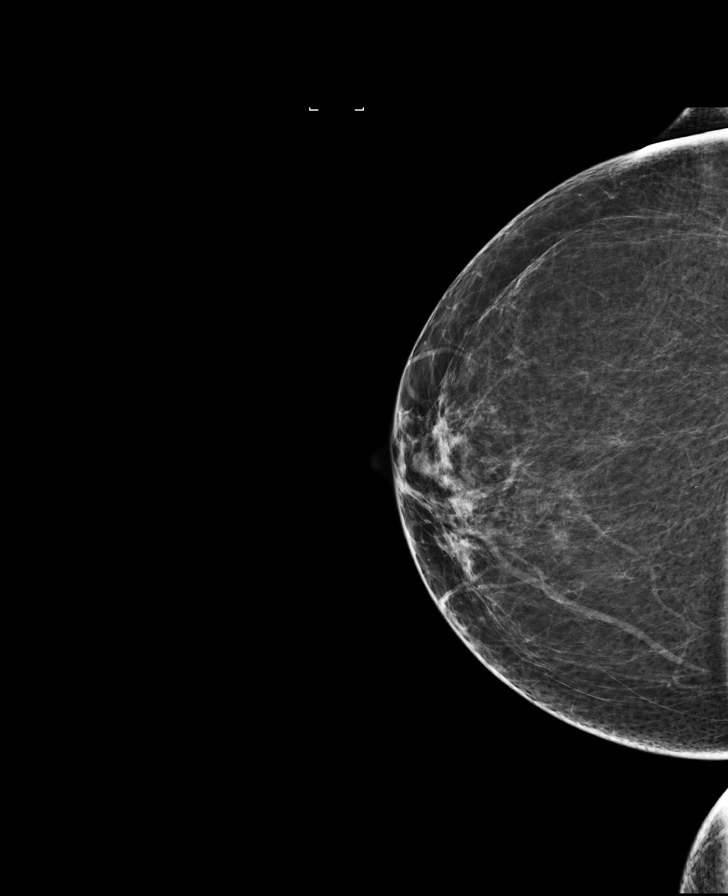

[R MLO synth-2D]
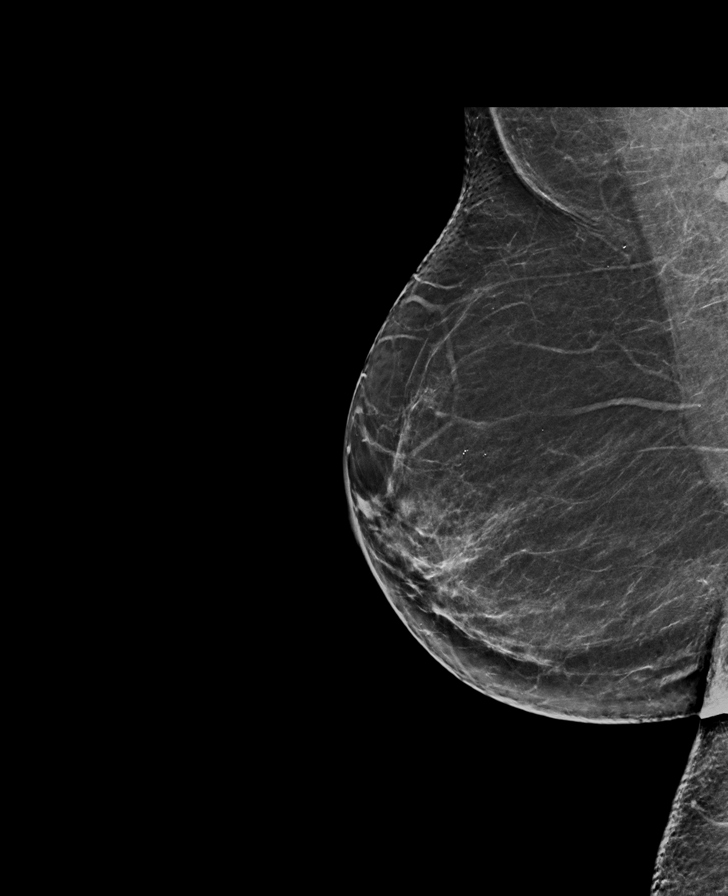

[8 of 28 positions shown; findings below may reference images not displayed]

ACR Breast Density Category b: There are scattered areas of
fibroglandular density.
FINDINGS: There are no findings suspicious for malignancy. Images were
processed with CAD.
IMPRESSION: No mammographic evidence of malignancy. A result letter of this
screening mammogram will be mailed directly to the patient.

RECOMMENDATION:
Screening mammogram in one year. (Code:55-L-23V)

BI-RADS CATEGORY  1: Negative.

## 2016-11-11 DIAGNOSIS — H401133 Primary open-angle glaucoma, bilateral, severe stage: Secondary | ICD-10-CM | POA: Diagnosis not present

## 2016-11-26 ENCOUNTER — Encounter: Payer: Self-pay | Admitting: Family Medicine

## 2016-11-26 ENCOUNTER — Ambulatory Visit (INDEPENDENT_AMBULATORY_CARE_PROVIDER_SITE_OTHER): Payer: Medicare Other | Admitting: Family Medicine

## 2016-11-26 VITALS — BP 124/82 | HR 72 | Temp 98.0°F | Wt 173.0 lb

## 2016-11-26 DIAGNOSIS — E1142 Type 2 diabetes mellitus with diabetic polyneuropathy: Secondary | ICD-10-CM | POA: Diagnosis not present

## 2016-11-26 DIAGNOSIS — K219 Gastro-esophageal reflux disease without esophagitis: Secondary | ICD-10-CM | POA: Insufficient documentation

## 2016-11-26 DIAGNOSIS — R1013 Epigastric pain: Secondary | ICD-10-CM | POA: Insufficient documentation

## 2016-11-26 LAB — CBC WITH DIFFERENTIAL/PLATELET
BASOS ABS: 0 10*3/uL (ref 0.0–0.1)
BASOS PCT: 0.4 % (ref 0.0–3.0)
EOS ABS: 0.1 10*3/uL (ref 0.0–0.7)
Eosinophils Relative: 2.2 % (ref 0.0–5.0)
HEMATOCRIT: 42.8 % (ref 36.0–46.0)
HEMOGLOBIN: 14.1 g/dL (ref 12.0–15.0)
LYMPHS PCT: 37.9 % (ref 12.0–46.0)
Lymphs Abs: 1.9 10*3/uL (ref 0.7–4.0)
MCHC: 32.8 g/dL (ref 30.0–36.0)
MCV: 91.8 fl (ref 78.0–100.0)
MONO ABS: 0.5 10*3/uL (ref 0.1–1.0)
Monocytes Relative: 9.6 % (ref 3.0–12.0)
NEUTROS ABS: 2.5 10*3/uL (ref 1.4–7.7)
Neutrophils Relative %: 49.9 % (ref 43.0–77.0)
PLATELETS: 208 10*3/uL (ref 150.0–400.0)
RBC: 4.67 Mil/uL (ref 3.87–5.11)
RDW: 14.2 % (ref 11.5–15.5)
WBC: 5 10*3/uL (ref 4.0–10.5)

## 2016-11-26 LAB — TROPONIN I: TNIDX: 0.01 ug/L (ref 0.00–0.06)

## 2016-11-26 LAB — COMPREHENSIVE METABOLIC PANEL
ALBUMIN: 4.2 g/dL (ref 3.5–5.2)
ALT: 28 U/L (ref 0–35)
AST: 18 U/L (ref 0–37)
Alkaline Phosphatase: 56 U/L (ref 39–117)
BILIRUBIN TOTAL: 0.7 mg/dL (ref 0.2–1.2)
BUN: 18 mg/dL (ref 6–23)
CALCIUM: 8.8 mg/dL (ref 8.4–10.5)
CO2: 31 mEq/L (ref 19–32)
CREATININE: 0.77 mg/dL (ref 0.40–1.20)
Chloride: 102 mEq/L (ref 96–112)
GFR: 76.69 mL/min (ref >60.00)
Glucose, Bld: 106 mg/dL — ABNORMAL HIGH (ref 70–99)
Potassium: 4.2 mEq/L (ref 3.5–5.1)
SODIUM: 138 meq/L (ref 135–145)
TOTAL PROTEIN: 7.1 g/dL (ref 6.0–8.3)

## 2016-11-26 LAB — LIPASE: LIPASE: 27 U/L (ref 11.0–59.0)

## 2016-11-26 MED ORDER — OMEPRAZOLE 40 MG PO CPDR: 40.0000 mg | DELAYED_RELEASE_CAPSULE | Freq: Every day | ORAL | 3 refills | Status: DC

## 2016-11-26 NOTE — Progress Notes (Signed)
BP 124/82 (BP Location: Left Arm, Patient Position: Sitting, Cuff Size: Normal)   Pulse 72   Temp 98 F (36.7 C) (Oral)   Wt 173 lb (78.5 kg)   SpO2 96%   BMI 30.65 kg/m    CC: abd pain Subjective:    Patient ID: [name], female    DOB: 03-11-1934, 82 y.o.   MRN: [id number]  HPI: [name] is a 82 y.o. female presenting on 11/26/2016 for Abdominal Pain (tightness around upper abd, intermittent. Started about 2 mos ago. Has not taken)   2+ mo h/o intermittent upper abdominal tightness in band like distribution to sides with some chest pressure at times, associated with pressure on shoulders. This can last 5-10 minutes then resolves. Occasional nausea, occasional L jaw pain but no shooting into L shoulder. No dyspnea with this or diaphoresis. Some abdominal distension without gassiness, indigestion or bloating. Intermittent GERD when she overeats. Symptoms not exertional or relieved by rest. Symptoms not crescendo/decrescendo, not brought on by fried or greasy foods.   Fmhx CAD.  No recent cardiac evaluation.  She does use stationary bike 30 min daily without any worsening or precipitation of sxs.   Lab Results  Component Value Date   HGBA1C 6.6 (H) 10/25/2016  cbg's doing well - this morning fasting 139, normally 110s.   Lab Results  Component Value Date   CHOL 121 08/13/2015   HDL 44.70 08/13/2015   LDLCALC 61 08/13/2015   TRIG 75.0 08/13/2015   CHOLHDL 3 08/13/2015  She is not on statin.  Reviewed MRI from 2015 - hepatic steatosis, hepatic cysts, no pancreatic mass, s/p cholecystectomy  Relevant past medical, surgical, family and social history reviewed and updated as indicated. Interim medical history since our last visit reviewed. Allergies and medications reviewed and updated. Outpatient Medications Prior to Visit  Medication Sig Dispense Refill  . aspirin 81 MG tablet Take 81 mg by mouth every Monday, Wednesday, and Friday.     Marland Kitchen atenolol (TENORMIN) 50  MG tablet Take 1 tablet (50 mg total) by mouth daily. 90 tablet 1  . Blood Glucose Monitoring Suppl (ONE TOUCH ULTRA SYSTEM KIT) W/DEVICE KIT 1 kit by Does not apply route once. 1 each 0  . cyanocobalamin (V-R VITAMIN B-12) 500 MCG tablet Take 1 tablet (500 mcg total) by mouth daily.    Marland Kitchen glucose blood (ONE TOUCH ULTRA TEST) test strip Use to check sugar daily. Dx: E11.9 100 each 3  . Lancet Device MISC One touch delica - Use as directed 1 each 1  . Lancets Misc. (ACCU-CHEK SOFTCLIX LANCET DEV) KIT Use at home to test blood sugars daily. 1 kit 0  . levothyroxine (SYNTHROID, LEVOTHROID) 112 MCG tablet Take 1 tablet (112 mcg total) by mouth daily. 90 tablet 1  . losartan (COZAAR) 50 MG tablet Take 1 tablet (50 mg total) by mouth daily. 90 tablet 1  . montelukast (SINGULAIR) 10 MG tablet Take 1 tablet (10 mg total) by mouth daily. 90 tablet 1  . Multiple Vitamins-Minerals (HAIR SKIN AND NAILS FORMULA PO) Take by mouth daily.    . mupirocin ointment (BACTROBAN) 2 % Place 1 application into the nose 2 (two) times daily. 22 g 0  . ONETOUCH DELICA LANCETS FINE MISC Use 1-2 times daily as needed 100 each 6   No facility-administered medications prior to visit.      Per HPI unless specifically indicated in ROS section below Review of Systems     Objective:  BP 124/82 (BP Location: Left Arm, Patient Position: Sitting, Cuff Size: Normal)   Pulse 72   Temp 98 F (36.7 C) (Oral)   Wt 173 lb (78.5 kg)   SpO2 96%   BMI 30.65 kg/m   Wt Readings from Last 3 Encounters:  11/26/16 173 lb (78.5 kg)  10/26/16 173 lb (78.5 kg)  07/22/16 168 lb (76.2 kg)    Physical Exam  Constitutional: She appears well-developed and well-nourished. No distress.  HENT:  Head: Normocephalic and atraumatic.  Mouth/Throat: Oropharynx is clear and moist. No oropharyngeal exudate.  Cardiovascular: Normal rate, regular rhythm, normal heart sounds and intact distal pulses.   No murmur heard. Pulmonary/Chest: Effort  normal and breath sounds normal. No respiratory distress. She has no wheezes. She has no rales.  Abdominal: Soft. Bowel sounds are normal. She exhibits no distension and no mass. There is no tenderness. There is no rebound and no guarding.  Musculoskeletal: She exhibits no edema.  Skin: Skin is warm and dry. No rash noted.  Psychiatric: She has a normal mood and affect.  Nursing note and vitals reviewed.  Lab Results  Component Value Date   ALT 34 08/13/2015   AST 20 08/13/2015   ALKPHOS 65 08/13/2015   BILITOT 0.8 08/13/2015    EKG - NSR rate 60s, normal intervals, no acute ST/T changes.     Assessment & Plan:   Problem List Items Addressed This Visit    Abdominal discomfort, epigastric - Primary    Describes intermittent episodes of bandlike upper abdominal pressure and discomfort over last several months. Cardiac risk factors include diabetes, obesity, fmhx CAD. Ddx includes GERD, ulcer, r/o CAD.  S/p cholecystectomy Check EKG today and labs.  Start omeprazole 66m daily.  Consider cards evaluation, consider abdominal [place]  Pt agrees with plan.       Relevant Orders   Comprehensive metabolic panel   CBC with Differential/Platelet   Lipase   Troponin I   EKG 12-Lead (Completed)   Type 2 diabetes, controlled, with peripheral neuropathy (HCC)       Follow up plan: Return if symptoms worsen or fail to improve.  JRia Bush MD

## 2016-11-26 NOTE — Patient Instructions (Signed)
Comienze omeprazole 40mg  diarios.  Revisaremos sangre hoy.  Electrocardiograma salio bien hoy.  Si no mejora con omeprazole, la remitiremos a Academic librarian.  Gusto verla hoy, llamenos con preguntas.

## 2016-11-26 NOTE — Assessment & Plan Note (Addendum)
Describes intermittent episodes of bandlike upper abdominal pressure and discomfort over last several months. Cardiac risk factors include diabetes, obesity, fmhx CAD. Ddx includes GERD, ulcer, r/o CAD.  S/p cholecystectomy Check EKG today and labs.  Start omeprazole 40mg  daily.  Consider cards evaluation, consider abdominal US.  Pt agrees with plan.

## 2017-01-15 LAB — HM DIABETES EYE EXAM

## 2017-01-25 ENCOUNTER — Ambulatory Visit: Payer: Medicare Other

## 2017-01-26 ENCOUNTER — Other Ambulatory Visit: Payer: Self-pay | Admitting: Family Medicine

## 2017-01-26 ENCOUNTER — Other Ambulatory Visit (INDEPENDENT_AMBULATORY_CARE_PROVIDER_SITE_OTHER): Payer: Medicare Other

## 2017-01-26 DIAGNOSIS — E1142 Type 2 diabetes mellitus with diabetic polyneuropathy: Secondary | ICD-10-CM

## 2017-01-26 DIAGNOSIS — E039 Hypothyroidism, unspecified: Secondary | ICD-10-CM | POA: Diagnosis not present

## 2017-01-26 LAB — TSH: TSH: 1.56 u[IU]/mL (ref 0.35–4.50)

## 2017-01-26 LAB — HEMOGLOBIN A1C: Hgb A1c MFr Bld: 6.7 % — ABNORMAL HIGH (ref 4.6–6.5)

## 2017-01-26 LAB — LIPID PANEL
CHOL/HDL RATIO: 3
CHOLESTEROL: 139 mg/dL (ref 0–200)
HDL: 46.3 mg/dL (ref >39.00)
LDL CALC: 74 mg/dL (ref 0–99)
NonHDL: 92.3
TRIGLYCERIDES: 93 mg/dL (ref 0.0–149.0)
VLDL: 18.6 mg/dL (ref 0.0–40.0)

## 2017-01-26 LAB — T4, FREE: Free T4: 1.02 ng/dL (ref 0.60–1.60)

## 2017-01-27 ENCOUNTER — Ambulatory Visit (INDEPENDENT_AMBULATORY_CARE_PROVIDER_SITE_OTHER): Payer: Medicare Other | Admitting: Family Medicine

## 2017-01-27 ENCOUNTER — Encounter: Payer: Self-pay | Admitting: Family Medicine

## 2017-01-27 VITALS — BP 130/78 | HR 63 | Temp 98.2°F | Ht 62.0 in | Wt 174.2 lb

## 2017-01-27 DIAGNOSIS — I10 Essential (primary) hypertension: Secondary | ICD-10-CM

## 2017-01-27 DIAGNOSIS — Z8619 Personal history of other infectious and parasitic diseases: Secondary | ICD-10-CM

## 2017-01-27 DIAGNOSIS — R1013 Epigastric pain: Secondary | ICD-10-CM | POA: Diagnosis not present

## 2017-01-27 DIAGNOSIS — M8580 Other specified disorders of bone density and structure, unspecified site: Secondary | ICD-10-CM | POA: Diagnosis not present

## 2017-01-27 DIAGNOSIS — Z0001 Encounter for general adult medical examination with abnormal findings: Secondary | ICD-10-CM | POA: Diagnosis not present

## 2017-01-27 DIAGNOSIS — K760 Fatty (change of) liver, not elsewhere classified: Secondary | ICD-10-CM | POA: Insufficient documentation

## 2017-01-27 DIAGNOSIS — Z7189 Other specified counseling: Secondary | ICD-10-CM

## 2017-01-27 DIAGNOSIS — E039 Hypothyroidism, unspecified: Secondary | ICD-10-CM | POA: Diagnosis not present

## 2017-01-27 DIAGNOSIS — E1142 Type 2 diabetes mellitus with diabetic polyneuropathy: Secondary | ICD-10-CM

## 2017-01-27 DIAGNOSIS — M5414 Radiculopathy, thoracic region: Secondary | ICD-10-CM

## 2017-01-27 DIAGNOSIS — Z0000 Encounter for general adult medical examination without abnormal findings: Secondary | ICD-10-CM

## 2017-01-27 DIAGNOSIS — H409 Unspecified glaucoma: Secondary | ICD-10-CM

## 2017-01-27 NOTE — Patient Instructions (Addendum)
Pare omeprazole. regrese a laboratorio para H pylori examen en 2-3 semanas. Tambien revisaremos sonograma abdominal.  Evite comidas que producen gas (beans, onions, celery, carrots, raisins, bananas, apricots, prunes, brussel sprouts, wheat germ, pretzels) Regresar en 3-4 meses para proxima visita.   Health Maintenance, Female Adopting a healthy lifestyle and getting preventive care can go a long way to promote health and wellness. Talk with your health care provider about what schedule of regular examinations is right for you. This is a good chance for you to check in with your provider about disease prevention and staying healthy. In between checkups, there are plenty of things you can do on your own. Experts have done a lot of research about which lifestyle changes and preventive measures are most likely to keep you healthy. Ask your health care provider for more information. Weight and diet Eat a healthy diet  Be sure to include plenty of vegetables, fruits, low-fat dairy products, and lean protein.  Do not eat a lot of foods high in solid fats, added sugars, or salt.  Get regular exercise. This is one of the most important things you can do for your health. ? Most adults should exercise for at least 150 minutes each week. The exercise should increase your heart rate and make you sweat (moderate-intensity exercise). ? Most adults should also do strengthening exercises at least twice a week. This is in addition to the moderate-intensity exercise.  Maintain a healthy weight  Body mass index (BMI) is a measurement that can be used to identify possible weight problems. It estimates body fat based on height and weight. Your health care provider can help determine your BMI and help you achieve or maintain a healthy weight.  For females 30 years of age and older: ? A BMI below 18.5 is considered underweight. ? A BMI of 18.5 to 24.9 is normal. ? A BMI of 25 to 29.9 is considered overweight. ? A  BMI of 30 and above is considered obese.  Watch levels of cholesterol and blood lipids  You should start having your blood tested for lipids and cholesterol at 80 years of age, then have this test every 5 years.  You may need to have your cholesterol levels checked more often if: ? Your lipid or cholesterol levels are high. ? You are older than 80 years of age. ? You are at high risk for heart disease.  Cancer screening Lung Cancer  Lung cancer screening is recommended for adults 52-1 years old who are at high risk for lung cancer because of a history of smoking.  A yearly low-dose CT scan of the lungs is recommended for people who: ? Currently smoke. ? Have quit within the past 15 years. ? Have at least a 30-pack-year history of smoking. A pack year is smoking an average of one pack of cigarettes a day for 1 year.  Yearly screening should continue until it has been 15 years since you quit.  Yearly screening should stop if you develop a health problem that would prevent you from having lung cancer treatment.  Breast Cancer  Practice breast self-awareness. This means understanding how your breasts normally appear and feel.  It also means doing regular breast self-exams. Let your health care provider know about any changes, no matter how small.  If you are in your 20s or 30s, you should have a clinical breast exam (CBE) by a health care provider every 1-3 years as part of a regular health exam.  If you  are 40 or older, have a CBE every year. Also consider having a breast X-ray (mammogram) every year.  If you have a family history of breast cancer, talk to your health care provider about genetic screening.  If you are at high risk for breast cancer, talk to your health care provider about having an MRI and a mammogram every year.  Breast cancer gene (BRCA) assessment is recommended for women who have family members with BRCA-related cancers. BRCA-related cancers  include: ? Breast. ? Ovarian. ? Tubal. ? Peritoneal cancers.  Results of the assessment will determine the need for genetic counseling and BRCA1 and BRCA2 testing.  Cervical Cancer Your health care provider may recommend that you be screened regularly for cancer of the pelvic organs (ovaries, uterus, and vagina). This screening involves a pelvic examination, including checking for microscopic changes to the surface of your cervix (Pap test). You may be encouraged to have this screening done every 3 years, beginning at age 21.  For women ages 30-65, health care providers may recommend pelvic exams and Pap testing every 3 years, or they may recommend the Pap and pelvic exam, combined with testing for human papilloma virus (HPV), every 5 years. Some types of HPV increase your risk of cervical cancer. Testing for HPV may also be done on women of any age with unclear Pap test results.  Other health care providers may not recommend any screening for nonpregnant women who are considered low risk for pelvic cancer and who do not have symptoms. Ask your health care provider if a screening pelvic exam is right for you.  If you have had past treatment for cervical cancer or a condition that could lead to cancer, you need Pap tests and screening for cancer for at least 20 years after your treatment. If Pap tests have been discontinued, your risk factors (such as having a new sexual partner) need to be reassessed to determine if screening should resume. Some women have medical problems that increase the chance of getting cervical cancer. In these cases, your health care provider may recommend more frequent screening and Pap tests.  Colorectal Cancer  This type of cancer can be detected and often prevented.  Routine colorectal cancer screening usually begins at 80 years of age and continues through 80 years of age.  Your health care provider may recommend screening at an earlier age if you have risk factors  for colon cancer.  Your health care provider may also recommend using home test kits to check for hidden blood in the stool.  A small camera at the end of a tube can be used to examine your colon directly (sigmoidoscopy or colonoscopy). This is done to check for the earliest forms of colorectal cancer.  Routine screening usually begins at age 50.  Direct examination of the colon should be repeated every 5-10 years through 80 years of age. However, you may need to be screened more often if early forms of precancerous polyps or small growths are found.  Skin Cancer  Check your skin from head to toe regularly.  Tell your health care provider about any new moles or changes in moles, especially if there is a change in a mole's shape or color.  Also tell your health care provider if you have a mole that is larger than the size of a pencil eraser.  Always use sunscreen. Apply sunscreen liberally and repeatedly throughout the day.  Protect yourself by wearing long sleeves, pants, a wide-brimmed hat, and sunglasses whenever   you are outside.  Heart disease, diabetes, and high blood pressure  High blood pressure causes heart disease and increases the risk of stroke. High blood pressure is more likely to develop in: ? People who have blood pressure in the high end of the normal range (130-139/85-89 mm Hg). ? People who are overweight or obese. ? People who are African American.  If you are 32-81 years of age, have your blood pressure checked every 3-5 years. If you are 2 years of age or older, have your blood pressure checked every year. You should have your blood pressure measured twice-once when you are at a hospital or clinic, and once when you are not at a hospital or clinic. Record the average of the two measurements. To check your blood pressure when you are not at a hospital or clinic, you can use: ? An automated blood pressure machine at a pharmacy. ? A home blood pressure monitor.  If  you are between 63 years and 30 years old, ask your health care provider if you should take aspirin to prevent strokes.  Have regular diabetes screenings. This involves taking a blood sample to check your fasting blood sugar level. ? If you are at a normal weight and have a low risk for diabetes, have this test once every three years after 80 years of age. ? If you are overweight and have a high risk for diabetes, consider being tested at a younger age or more often. Preventing infection Hepatitis B  If you have a higher risk for hepatitis B, you should be screened for this virus. You are considered at high risk for hepatitis B if: ? You were born in a country where hepatitis B is common. Ask your health care provider which countries are considered high risk. ? Your parents were born in a high-risk country, and you have not been immunized against hepatitis B (hepatitis B vaccine). ? You have HIV or AIDS. ? You use needles to inject street drugs. ? You live with someone who has hepatitis B. ? You have had sex with someone who has hepatitis B. ? You get hemodialysis treatment. ? You take certain medicines for conditions, including cancer, organ transplantation, and autoimmune conditions.  Hepatitis C  Blood testing is recommended for: ? Everyone born from [birth year] through 1963. ? Anyone with known risk factors for hepatitis C.  Sexually transmitted infections (STIs)  You should be screened for sexually transmitted infections (STIs) including gonorrhea and chlamydia if: ? You are sexually active and are younger than 80 years of age. ? You are older than 80 years of age and your health care provider tells you that you are at risk for this type of infection. ? Your sexual activity has changed since you were last screened and you are at an increased risk for chlamydia or gonorrhea. Ask your health care provider if you are at risk.  If you do not have HIV, but are at risk, it may be recommended  that you take a prescription medicine daily to prevent HIV infection. This is called pre-exposure prophylaxis (PrEP). You are considered at risk if: ? You are sexually active and do not regularly use condoms or know the HIV status of your partner(s). ? You take drugs by injection. ? You are sexually active with a partner who has HIV.  Talk with your health care provider about whether you are at high risk of being infected with HIV. If you choose to begin PrEP, you should first  be tested for HIV. You should then be tested every 3 months for as long as you are taking PrEP. Pregnancy  If you are premenopausal and you may become pregnant, ask your health care provider about preconception counseling.  If you may become pregnant, take 400 to 800 micrograms (mcg) of folic acid every day.  If you want to prevent pregnancy, talk to your health care provider about birth control (contraception). Osteoporosis and menopause  Osteoporosis is a disease in which the bones lose minerals and strength with aging. This can result in serious bone fractures. Your risk for osteoporosis can be identified using a bone density scan.  If you are 65 years of age or older, or if you are at risk for osteoporosis and fractures, ask your health care provider if you should be screened.  Ask your health care provider whether you should take a calcium or vitamin D supplement to lower your risk for osteoporosis.  Menopause may have certain physical symptoms and risks.  Hormone replacement therapy may reduce some of these symptoms and risks. Talk to your health care provider about whether hormone replacement therapy is right for you. Follow these instructions at home:  Schedule regular health, dental, and eye exams.  Stay current with your immunizations.  Do not use any tobacco products including cigarettes, chewing tobacco, or electronic cigarettes.  If you are pregnant, do not drink alcohol.  If you are  breastfeeding, limit how much and how often you drink alcohol.  Limit alcohol intake to no more than 1 drink per day for nonpregnant women. One drink equals 12 ounces of beer, 5 ounces of wine, or 1 ounces of hard liquor.  Do not use street drugs.  Do not share needles.  Ask your health care provider for help if you need support or information about quitting drugs.  Tell your health care provider if you often feel depressed.  Tell your health care provider if you have ever been abused or do not feel safe at home. This information is not intended to replace advice given to you by your health care provider. Make sure you discuss any questions you have with your health care provider. Document Released: 08/17/2010 Document Revised: 07/10/2015 Document Reviewed: 11/05/2014 Elsevier Interactive Patient Education  2018 [employer]  

## 2017-01-27 NOTE — Assessment & Plan Note (Addendum)
Chronic, stable. Discussed correct home arm cuff fit. Home BP cuff may not be as accurate as desired. She will buy new cuff. Continue current antihypertensive regimen.

## 2017-01-27 NOTE — Assessment & Plan Note (Signed)
Advanced directive discussion - does not have available. Packet provided last year. has not reviewed yet.

## 2017-01-27 NOTE — Progress Notes (Signed)
BP 130/78 (BP Location: Left Arm, Patient Position: Sitting, Cuff Size: Normal)   Pulse 63   Temp 98.2 F (36.8 C) (Oral)   Ht 5' 2"  (1.575 m)   Wt 174 lb 4 oz (79 kg)   SpO2 98%   BMI 31.87 kg/m   Rpt BP with home cuff - 156/84, HR 62  CC: CPE/AMW Subjective:    Patient ID: [name], female    DOB: 1934/05/20, 82 y.o.   MRN: [id number]  HPI: [name] is a 82 y.o. female presenting on 01/27/2017 for Medicare Wellness and Elevated BP (Pt has had elevated BP readings at home. Brought home monitor which read 161/82 at [facility])   See prior note for details. Ongoing upper abdominal tightness associated with bloating and gassiness not resolved with PPI. Gas X helps. Did not tolerate PPI. Coke zero helps. She drinks lactaid.   Did not see Katha Cabal this year.   Vision screen - failed R eye. Sees eye doctor regularly Hearing screen - failed R ear - declines audiology referral at this time  Hearing Screening   125Hz  250Hz  500Hz  1000Hz  2000Hz  3000Hz  4000Hz  6000Hz  8000Hz   Right ear:   40 40 0  0    Left ear:   40 40 40  0      Visual Acuity Screening   Right eye Left eye Both eyes  Without correction:     With correction: 20/50 20/30 20/30     No falls in past year No depression  Preventative: COLONOSCOPY WITH PROPOFOL 07/22/2016 diverticulosis Allen Norris, Darren, MD) Lung cancer screening - not indicated Breast cancer screening - mammo WNL 07/2016 Well woman exam - Last normal pap 2014. Will age out.  DEXA scan 2014 T -1.5 hip osteopenia Flu shot - yearly Tetanus shot - declines prevnar 2015, pneumovax 2013 Shingrix - discussed, declines  Advanced directive discussion - does not have available. Packet provided last year. has not reviewed yet.  Seat belt use discussed Sunscreen use discussed. No changing moles on skin.  Non smoker Rare alcohol.   Lives in [place], from [place]. Daughter lives here. 4 daughters Work - retired Data processing manager Diet - avoids sugar Exercise - stationary  bike daily at home   Relevant past medical, surgical, family and social history reviewed and updated as indicated. Interim medical history since our last visit reviewed. Allergies and medications reviewed and updated. Outpatient Medications Prior to Visit  Medication Sig Dispense Refill  . aspirin 81 MG tablet Take 81 mg by mouth every Monday, Wednesday, and Friday.     Marland Kitchen atenolol (TENORMIN) 50 MG tablet Take 1 tablet (50 mg total) by mouth daily. 90 tablet 1  . Blood Glucose Monitoring Suppl (ONE TOUCH ULTRA SYSTEM KIT) W/DEVICE KIT 1 kit by Does not apply route once. 1 each 0  . cyanocobalamin (V-R VITAMIN B-12) 500 MCG tablet Take 1 tablet (500 mcg total) by mouth daily.    Marland Kitchen glucose blood (ONE TOUCH ULTRA TEST) test strip Use to check sugar daily. Dx: E11.9 100 each 3  . Lancet Device MISC One touch delica - Use as directed 1 each 1  . Lancets Misc. (ACCU-CHEK SOFTCLIX LANCET DEV) KIT Use at home to test blood sugars daily. 1 kit 0  . levothyroxine (SYNTHROID, LEVOTHROID) 112 MCG tablet Take 1 tablet (112 mcg total) by mouth daily. 90 tablet 1  . losartan (COZAAR) 50 MG tablet Take 1 tablet (50 mg total) by mouth daily. 90 tablet 1  . montelukast (  SINGULAIR) 10 MG tablet Take 1 tablet (10 mg total) by mouth daily. 90 tablet 1  . Multiple Vitamins-Minerals (HAIR SKIN AND NAILS FORMULA PO) Take by mouth daily.    . mupirocin ointment (BACTROBAN) 2 % Place 1 application into the nose 2 (two) times daily. 22 g 0  . omeprazole (PRILOSEC) 40 MG capsule Take 1 capsule (40 mg total) by mouth daily. Take daily for 3 weeks then as needed 30 capsule 3  . ONETOUCH DELICA LANCETS FINE MISC Use 1-2 times daily as needed 100 each 6   No facility-administered medications prior to visit.      Per HPI unless specifically indicated in ROS section below Review of Systems  Constitutional: Negative for activity change, appetite change, chills, fatigue, fever and unexpected weight change.  HENT: Negative for  hearing loss.   Eyes: Negative for visual disturbance.  Respiratory: Positive for cough. Negative for chest tightness, shortness of breath and wheezing.   Cardiovascular: Negative for chest pain, palpitations and leg swelling.  Gastrointestinal: Positive for abdominal distention, abdominal pain and nausea (mild). Negative for blood in stool, constipation, diarrhea and vomiting.  Endocrine: Positive for cold intolerance.  Genitourinary: Negative for difficulty urinating and hematuria.  Musculoskeletal: Negative for arthralgias, myalgias and neck pain.  Skin: Negative for rash.  Neurological: Negative for dizziness, seizures, syncope and headaches.  Hematological: Negative for adenopathy. Does not bruise/bleed easily.  Psychiatric/Behavioral: Negative for dysphoric mood. The patient is not nervous/anxious.        Objective:    BP 130/78 (BP Location: Left Arm, Patient Position: Sitting, Cuff Size: Normal)   Pulse 63   Temp 98.2 F (36.8 C) (Oral)   Ht 5' 2"  (1.575 m)   Wt 174 lb 4 oz (79 kg)   SpO2 98%   BMI 31.87 kg/m   Wt Readings from Last 3 Encounters:  01/27/17 174 lb 4 oz (79 kg)  11/26/16 173 lb (78.5 kg)  10/26/16 173 lb (78.5 kg)    Physical Exam  Constitutional: She is oriented to person, place, and time. She appears well-developed and well-nourished. No distress.  HENT:  Head: Normocephalic and atraumatic.  Right Ear: Hearing, tympanic membrane, external ear and ear canal normal.  Left Ear: Hearing, tympanic membrane, external ear and ear canal normal.  Nose: Nose normal.  Mouth/Throat: Uvula is midline, oropharynx is clear and moist and mucous membranes are normal. No oropharyngeal exudate, posterior oropharyngeal edema or posterior oropharyngeal erythema.  Eyes: Conjunctivae and EOM are normal. Pupils are equal, round, and reactive to light. No scleral icterus.  Neck: Normal range of motion. Neck supple. Carotid bruit is not present. No thyromegaly present.    Cardiovascular: Normal rate, regular rhythm and intact distal pulses.  Murmur (2/6 SEM) heard. Pulses:      Radial pulses are 2+ on the right side, and 2+ on the left side.  Pulmonary/Chest: Effort normal and breath sounds normal. No respiratory distress. She has no wheezes. She has no rales.  Abdominal: Soft. Bowel sounds are normal. She exhibits no distension and no mass. There is no tenderness. There is no rebound and no guarding.  Musculoskeletal: Normal range of motion. She exhibits no edema.  Lymphadenopathy:    She has no cervical adenopathy.  Neurological: She is alert and oriented to person, place, and time.  CN grossly intact, station and gait intact Recall 3/3 Calculation O-[id number]-N-U-M  Skin: Skin is warm and dry. No rash noted.  Psychiatric: She has a normal mood and affect. Her  behavior is normal. Judgment and thought content normal.  Nursing note and vitals reviewed.  Results for orders placed or performed in visit on 01/26/17  T4, free  Result Value Ref Range   Free T4 1.02 0.60 - 1.60 ng/dL  TSH  Result Value Ref Range   TSH 1.56 0.35 - 4.50 uIU/mL  Lipid panel  Result Value Ref Range   Cholesterol 139 0 - 200 mg/dL   Triglycerides 93.0 0.0 - 149.0 mg/dL   HDL 46.30 >39.00 mg/dL   VLDL 18.6 0.0 - 40.0 mg/dL   LDL Cholesterol 74 0 - 99 mg/dL   Total CHOL/HDL Ratio 3    NonHDL 92.30   Hemoglobin A1c  Result Value Ref Range   Hgb A1c MFr Bld 6.7 (H) 4.6 - 6.5 %      Assessment & Plan:   Problem List Items Addressed This Visit    Abdominal discomfort, epigastric    Ongoing pressure discomfort associated with bloating/gassiness improved with gas X. Recent labwork unrevealing. S/p cholecystectomy. rec avoid gas producing foods. Will stop omeprazole as it was ineffective. Will return in 2-3 wks for H pylori testing. Will check abd [place].       Relevant Orders   US Abdomen Complete   Helicobacter pylori special antigen   Advanced care planning/counseling discussion     Advanced directive discussion - does not have available. Packet provided last year. has not reviewed yet.       Encounter for general adult medical examination with abnormal findings    Preventative protocols reviewed and updated unless pt declined. Discussed healthy diet and lifestyle.       Fatty liver disease, nonalcoholic   Relevant Orders   US Abdomen Complete   Glaucoma    Regularly sees ophtho, not on drops.      History of Helicobacter pylori infection   Relevant Orders   Helicobacter pylori special antigen   Hypertension    Chronic, stable. Discussed correct home arm cuff fit. Home BP cuff may not be as accurate as desired. She will buy new cuff. Continue current antihypertensive regimen.      Hypothyroidism    Chronic, stable. Continue levothyroxine 117mg daily.       Medicare annual wellness visit, subsequent - Primary    I have personally reviewed the Medicare Annual Wellness questionnaire and have noted 1. The patient's medical and social history 2. Their use of alcohol, tobacco or illicit drugs 3. Their current medications and supplements 4. The patient's functional ability including ADL's, fall risks, home safety risks and hearing or visual impairment. Cognitive function has been assessed and addressed as indicated.  5. Diet and physical activity 6. Evidence for depression or mood disorders The patients weight, height, BMI have been recorded in the chart. I have made referrals, counseling and provided education to the patient based on review of the above and I have provided the pt with a written personalized care plan for preventive services. Provider list updated.. See scanned questionairre as needed for further documentation. Reviewed preventative protocols and updated unless pt declined.       Osteopenia    Last DEXA 2014 - T -1.5 at hip.       Thoracic radiculitis    Well controlled with gabapentin - continue.       Type 2 diabetes, controlled,  with peripheral neuropathy (HCC)    Chronic, stable, diet controlled. Reviewed low carb low sugar diabetic diet.       Relevant Orders  US Abdomen Complete       Follow up plan: No Follow-up on file.  Ria Bush, MD

## 2017-01-27 NOTE — Assessment & Plan Note (Signed)
Chronic, stable, diet controlled. Reviewed low carb low sugar diabetic diet.

## 2017-01-27 NOTE — Assessment & Plan Note (Signed)
Preventative protocols reviewed and updated unless pt declined. Discussed healthy diet and lifestyle.  

## 2017-01-27 NOTE — Assessment & Plan Note (Signed)
Regularly sees ophtho, not on drops.

## 2017-01-27 NOTE — Assessment & Plan Note (Signed)
Last DEXA 2014 - T -1.5 at hip.

## 2017-01-27 NOTE — Assessment & Plan Note (Signed)

## 2017-01-27 NOTE — Assessment & Plan Note (Signed)
Chronic, stable. Continue levothyroxine 120mcg daily.

## 2017-01-27 NOTE — Assessment & Plan Note (Signed)
Well controlled with gabapentin - continue.

## 2017-01-27 NOTE — Assessment & Plan Note (Signed)
Ongoing pressure discomfort associated with bloating/gassiness improved with gas X. Recent labwork unrevealing. S/p cholecystectomy. rec avoid gas producing foods. Will stop omeprazole as it was ineffective. Will return in 2-3 wks for H pylori testing. Will check abd [place].

## 2017-01-31 ENCOUNTER — Ambulatory Visit
Admission: RE | Admit: 2017-01-31 | Discharge: 2017-01-31 | Disposition: A | Discharge status: Home / Self Care | Payer: Medicare Other | Source: Ambulatory Visit | Attending: Family Medicine | Admitting: Family Medicine

## 2017-01-31 DIAGNOSIS — E1142 Type 2 diabetes mellitus with diabetic polyneuropathy: Secondary | ICD-10-CM | POA: Diagnosis not present

## 2017-01-31 DIAGNOSIS — K760 Fatty (change of) liver, not elsewhere classified: Secondary | ICD-10-CM | POA: Insufficient documentation

## 2017-01-31 DIAGNOSIS — R1013 Epigastric pain: Secondary | ICD-10-CM | POA: Diagnosis not present

## 2017-01-31 DIAGNOSIS — K7689 Other specified diseases of liver: Secondary | ICD-10-CM | POA: Diagnosis not present

## 2017-02-04 ENCOUNTER — Encounter: Payer: Self-pay | Admitting: Family Medicine

## 2017-02-04 ENCOUNTER — Ambulatory Visit (INDEPENDENT_AMBULATORY_CARE_PROVIDER_SITE_OTHER): Payer: Medicare Other | Admitting: Family Medicine

## 2017-02-04 VITALS — BP 118/60 | HR 60 | Temp 98.2°F | Wt 175.0 lb

## 2017-02-04 DIAGNOSIS — R1013 Epigastric pain: Secondary | ICD-10-CM | POA: Diagnosis not present

## 2017-02-04 DIAGNOSIS — Z8619 Personal history of other infectious and parasitic diseases: Secondary | ICD-10-CM | POA: Diagnosis not present

## 2017-02-04 DIAGNOSIS — J019 Acute sinusitis, unspecified: Secondary | ICD-10-CM | POA: Diagnosis not present

## 2017-02-04 DIAGNOSIS — K760 Fatty (change of) liver, not elsewhere classified: Secondary | ICD-10-CM

## 2017-02-04 MED ORDER — FLUTICASONE PROPIONATE 50 MCG/ACT NA SUSP: 2.0000 | Freq: Every day | NASAL | 6 refills | Status: DC

## 2017-02-04 MED ORDER — DOXYCYCLINE HYCLATE 100 MG PO TABS: 100.0000 mg | ORAL_TABLET | Freq: Two times a day (BID) | ORAL | 0 refills | Status: DC

## 2017-02-04 NOTE — Assessment & Plan Note (Signed)
Reviewed recent US with patient.

## 2017-02-04 NOTE — Patient Instructions (Signed)
Tiene una sinusitis - puede ser bacterial o viral.  Empieze flonase nasal spray.  Mucho liquido y Development worker, community.  Busque mucinex simple con un vaso de agua para mobilizar flema.  [facility]	[place] - doxycycline por 10 dias con comida Si no mejora o si fiebre >101, o empeora la tos, dejenos saber.

## 2017-02-04 NOTE — Assessment & Plan Note (Signed)
Anticipate acute sinusitis of 8d duration. Supportive care reviewed - rec flonase and mucinex 1-2 days and if no improvement will fill WASP for doxy provided today. Pt agrees with plan.

## 2017-02-04 NOTE — Progress Notes (Signed)
BP 118/60 (BP Location: Left Arm, Patient Position: Sitting, Cuff Size: Normal)   Pulse 60   Temp 98.2 F (36.8 C) (Oral)   Wt 175 lb (79.4 kg)   SpO2 97%   BMI 32.01 kg/m    CC: URI sxs Subjective:    Patient ID: [name], female    DOB: 05-Feb-1935, 82 y.o.   MRN: [id number]  HPI: [name] is a 82 y.o. female presenting on 02/04/2017 for URI symptoms (Was seen last week but worse. C/o nasal and head congestion, postnasal drip which triggers cough. Has also had some dizziness and is low energy)   See prior note for details.  1+ wk h/o URI sxs - sore throat, productive cough, sinus pressure, L earache. Fatigue and malaise. Mild dizziness. Some cold chills today.   Treating with listerine gargles, robitussin. No sick contacts at home.  Non smoker No h/o asthma.   Relevant past medical, surgical, family and social history reviewed and updated as indicated. Interim medical history since our last visit reviewed. Allergies and medications reviewed and updated. Outpatient Medications Prior to Visit  Medication Sig Dispense Refill  . aspirin 81 MG tablet Take 81 mg by mouth every Monday, Wednesday, and Friday.     Marland Kitchen atenolol (TENORMIN) 50 MG tablet Take 1 tablet (50 mg total) by mouth daily. 90 tablet 1  . Blood Glucose Monitoring Suppl (ONE TOUCH ULTRA SYSTEM KIT) W/DEVICE KIT 1 kit by Does not apply route once. 1 each 0  . cyanocobalamin (V-R VITAMIN B-12) 500 MCG tablet Take 1 tablet (500 mcg total) by mouth daily.    Marland Kitchen glucose blood (ONE TOUCH ULTRA TEST) test strip Use to check sugar daily. Dx: E11.9 100 each 3  . Lancet Device MISC One touch delica - Use as directed 1 each 1  . Lancets Misc. (ACCU-CHEK SOFTCLIX LANCET DEV) KIT Use at home to test blood sugars daily. 1 kit 0  . levothyroxine (SYNTHROID, LEVOTHROID) 112 MCG tablet Take 1 tablet (112 mcg total) by mouth daily. 90 tablet 1  . losartan (COZAAR) 50 MG tablet Take 1 tablet (50 mg total) by mouth daily. 90  tablet 1  . montelukast (SINGULAIR) 10 MG tablet Take 1 tablet (10 mg total) by mouth daily. 90 tablet 1  . Multiple Vitamins-Minerals (HAIR SKIN AND NAILS FORMULA PO) Take by mouth daily.    . mupirocin ointment (BACTROBAN) 2 % Place 1 application into the nose 2 (two) times daily. 22 g 0  . omeprazole (PRILOSEC) 40 MG capsule Take 1 capsule (40 mg total) by mouth daily. Take daily for 3 weeks then as needed 30 capsule 3  . ONETOUCH DELICA LANCETS FINE MISC Use 1-2 times daily as needed 100 each 6   No facility-administered medications prior to visit.      Per HPI unless specifically indicated in ROS section below Review of Systems     Objective:    BP 118/60 (BP Location: Left Arm, Patient Position: Sitting, Cuff Size: Normal)   Pulse 60   Temp 98.2 F (36.8 C) (Oral)   Wt 175 lb (79.4 kg)   SpO2 97%   BMI 32.01 kg/m   Wt Readings from Last 3 Encounters:  02/04/17 175 lb (79.4 kg)  01/27/17 174 lb 4 oz (79 kg)  11/26/16 173 lb (78.5 kg)    Physical Exam  Constitutional: She appears well-developed and well-nourished. No distress.  HENT:  Head: Normocephalic and atraumatic.  Right Ear: Hearing, tympanic membrane,  external ear and ear canal normal.  Left Ear: Hearing, tympanic membrane, external ear and ear canal normal.  Nose: Mucosal edema (nasal mucosal congestion) present. No rhinorrhea. Right sinus exhibits maxillary sinus tenderness and frontal sinus tenderness. Left sinus exhibits maxillary sinus tenderness and frontal sinus tenderness.  Mouth/Throat: Uvula is midline, oropharynx is clear and moist and mucous membranes are normal. No oropharyngeal exudate, posterior oropharyngeal edema, posterior oropharyngeal erythema or tonsillar abscesses.  Eyes: Conjunctivae and EOM are normal. Pupils are equal, round, and reactive to light. No scleral icterus.  Neck: Normal range of motion. Neck supple.  Cardiovascular: Normal rate, regular rhythm, normal heart sounds and intact  distal pulses.  No murmur heard. Pulmonary/Chest: Effort normal and breath sounds normal. No respiratory distress. She has no wheezes. She has no rales.  Lymphadenopathy:    She has no cervical adenopathy.  Skin: Skin is warm and dry. No rash noted.  Nursing note and vitals reviewed.     Assessment & Plan:   Problem List Items Addressed This Visit    Acute sinusitis - Primary    Anticipate acute sinusitis of 8d duration. Supportive care reviewed - rec flonase and mucinex 1-2 days and if no improvement will fill WASP for doxy provided today. Pt agrees with plan.       Relevant Medications   fluticasone (FLONASE) 50 MCG/ACT nasal spray   doxycycline (VIBRA-TABS) 100 MG tablet   Fatty liver disease, nonalcoholic    Reviewed recent US with patient.           Follow up plan: Return if symptoms worsen or fail to improve.  Ria Bush, MD

## 2017-02-17 ENCOUNTER — Other Ambulatory Visit: Payer: Medicare Other

## 2017-02-21 DIAGNOSIS — Z8619 Personal history of other infectious and parasitic diseases: Secondary | ICD-10-CM | POA: Diagnosis not present

## 2017-02-21 DIAGNOSIS — R1013 Epigastric pain: Secondary | ICD-10-CM | POA: Diagnosis not present

## 2017-02-21 NOTE — Addendum Note (Signed)
Addended by: Ellamae Sia on: 02/21/2017 08:57 AM   Modules accepted: Orders

## 2017-02-22 LAB — HELICOBACTER PYLORI  SPECIAL ANTIGEN
MICRO NUMBER:: 90025398
SPECIMEN QUALITY: ADEQUATE

## 2017-03-14 DIAGNOSIS — H401133 Primary open-angle glaucoma, bilateral, severe stage: Secondary | ICD-10-CM | POA: Diagnosis not present

## 2017-03-25 ENCOUNTER — Encounter: Payer: Self-pay | Admitting: Family Medicine

## 2017-03-25 ENCOUNTER — Ambulatory Visit (INDEPENDENT_AMBULATORY_CARE_PROVIDER_SITE_OTHER): Payer: Medicare Other | Admitting: Family Medicine

## 2017-03-25 VITALS — BP 118/72 | HR 56 | Temp 98.0°F | Wt 170.0 lb

## 2017-03-25 DIAGNOSIS — E1142 Type 2 diabetes mellitus with diabetic polyneuropathy: Secondary | ICD-10-CM | POA: Diagnosis not present

## 2017-03-25 DIAGNOSIS — J029 Acute pharyngitis, unspecified: Secondary | ICD-10-CM | POA: Diagnosis not present

## 2017-03-25 DIAGNOSIS — J111 Influenza due to unidentified influenza virus with other respiratory manifestations: Secondary | ICD-10-CM | POA: Insufficient documentation

## 2017-03-25 LAB — POCT RAPID STREP A (OFFICE): Rapid Strep A Screen: NEGATIVE

## 2017-03-25 MED ORDER — OSELTAMIVIR PHOSPHATE 75 MG PO CAPS: 75.0000 mg | ORAL_CAPSULE | Freq: Two times a day (BID) | ORAL | 0 refills | Status: DC

## 2017-03-25 NOTE — Assessment & Plan Note (Addendum)
Clinical diagnosis (we have no flu swabs available).  Rx tamiflu given age/comorbidities. Further supportive care as per instructions.  Update if not improving with treatment. Pt agrees with plan.

## 2017-03-25 NOTE — Patient Instructions (Signed)
Creo que tiene influenza - tome tamiflu mandado a su farmacia.  Descansa y toma mucho liquido este fin de [place]. Dejenos saber si no mejora con tratamiento.  Gripe en los adultos (Influenza, Adult) La gripe es una infeccin viral que afecta principalmente las vas respiratorias. Las vas respiratorias incluyen rganos que ayudan a Ambulance person, [employer], la nariz y Patent examiner. La gripe provoca muchos sntomas del resfro comn, as como fiebre alta y Hydrologist. Se transmite fcilmente de persona a persona (es contagiosa). La mejor manera de prevenir la gripe es aplicndose la vacuna contra la gripe todos los aos. CAUSAS La gripe es causada por un virus. Se puede contagiar el virus de las siguientes [place]:  Al aspirar las gotitas que una persona infectada elimina al toser o Brewing technologist.  Al tocar algo que fue recientemente contaminado con el virus y [employer] mano a la boca, la nariz o los ojos. [place] pueden hacer que usted sea propenso a Museum/gallery curator la gripe:  No lavarse las manos frecuentemente con agua y [place] o un desinfectante de manos a base de alcohol.  Tener contacto cercano con [employer] durante la temporada de resfro y gripe.  Tocarse la boca, los ojos o la nariz sin lavarse ni desinfectarse las manos primero.  No beber suficientes lquidos o no seguir una dieta saludable.  No dormir lo suficiente o no hacer suficiente actividad fsica.  Tener un alto grado de estrs.  No colocarse la vacuna anual contra la gripe. Puede correr un mayor riesgo de complicaciones de la gripe, como una infeccin pulmonar grave (neumona) si:  Tiene ms de 67aos.  Est embarazada.  Tiene un sistema que combate las defensas (sistema inmunitario) debilitado. Puede tener un sistema inmunitario debilitado si: ? [place]. ? Est recibiendo quimioterapia. ? [place] medicamentos que reducen [employer] (suprimen) del sistema  inmunitario.  Tiene una enfermedad a largo plazo (crnica), como cardiopata coronaria, enfermedad renal, diabetes o enfermedad pulmonar.  Tiene un trastorno heptico.  Son obesas.  Tiene anemia. SNTOMAS Los sntomas de esta afeccin por lo general duran entre 4 y 46das, y Location manager los siguientes sntomas:  Cristy Hilts.  Escalofros.  Dolor de [place], dolores en el cuerpo o dolores musculares.  Dolor de Investment banker, operational.  Tos.  Secrecin o congestin nasal.  Molestias en el pecho y tos.  Prdida del apetito.  Debilidad o cansancio (fatiga).  Mareos.  Nuseas o vmitos. DIAGNSTICO Esta afeccin se puede diagnosticar en funcin de los antecedentes mdicos y un examen fsico. El mdico puede indicarle un cultivo farngeo o nasal para confirmar el diagnstico. TRATAMIENTO Si la gripe se detecta de manera temprana, puede recibir tratamiento con medicamentos antivirales que pueden reducir la duracin de la enfermedad y la gravedad de los sntomas. Este medicamento se puede administrar por va oral o por va intravenosa (IV), es decir, a travs de un tubo que se coloca en una vena. El objetivo del tratamiento es aliviar los sntomas cuidndose en su casa. Esto puede incluir usar medicamentos de venta libre, beber una gran cantidad de lquidos y agregar humedad al aire en su casa. En algunos casos, la gripe se cura sin tratamiento. [place] gripe se pueden tratar en un hospital. [place] los medicamentos de venta libre y los recetados solamente como se lo haya indicado el mdico.  Use un humidificador de aire fro para agregar humedad al  aire de su casa. Esto puede ayudarlo a Fish farm manager.  Descanse todo lo que sea necesario.  Beba suficiente lquido para Consulting civil engineer orina clara o de color amarillo plido.  Al toser o estornudar, cbrase la boca y la [place].  Lvese las manos con agua y jabn frecuentemente,  en especial despus de toser o Brewing technologist. Use desinfectante para manos si no dispone de [place] y [place].  Foy [place] en su casa y no concurra al [place] o a la escuela, como se lo haya indicado el mdico. A menos que visite al [employer], evite salir de su casa hasta que no tenga fiebre durante 24horas sin el uso de medicamentos.  Concurra a todas las visitas de control como se lo haya indicado el mdico. Esto es importante.  PREVENCIN  Aplicarse la vacuna anual contra la gripe es la mejor manera de evitar contagiarse la gripe. Puede aplicarse la vacuna contra la gripe a fines de verano, en otoo o en invierno. Pregntele al mdico cundo debe aplicarse la vacuna contra la gripe.  Lvese las manos o use un desinfectante de manos con frecuencia.  Evite el contacto con personas que estn enfermas durante la temporada de resfro y gripe.  Siga una dieta saludable, beba muchos lquidos, duerma lo suficiente y realice actividad fsica con regularidad.  SOLICITE ATENCIN MDICA SI:  Presenta nuevos sntomas.  Tiene los siguientes sntomas: ? Tourist information centre manager. ? Diarrea. ? Fiebre.  La tos empeora.  Produce ms mucosidad.  Siente nuseas o vomita.  SOLICITE ATENCIN MDICA DE INMEDIATO SI:  Le falta el aire o tiene dificultad para respirar.  La piel o las uas se tornan de un color [place].  Presenta dolor intenso o rigidez de cuello.  Le duele la cabeza de forma repentina o tiene dolor en la cara o el odo.  No puede dejar de vomitar.  Esta informacin no tiene Marine scientist el consejo del mdico. Asegrese de hacerle al mdico cualquier pregunta que tenga. Document Released: 11/11/2004 Document Revised: 05/26/2015 Document Reviewed: 11/26/2014 Elsevier Interactive Patient Education  2017 [employer].

## 2017-03-25 NOTE — Progress Notes (Signed)
BP 118/72 (BP Location: Left Arm, Patient Position: Sitting, Cuff Size: Normal)   Pulse (!) 56   Temp 98 F (36.7 C) (Oral)   Wt 170 lb (77.1 kg)   SpO2 96%   BMI 31.09 kg/m    CC: sinus congestion Subjective:    Patient ID: [name], female    DOB: May 23, 1934, 83 y.o.   MRN: [id number]  HPI: [name] is a 83 y.o. female presenting on 03/25/2017 for Sinus Problem (Sinus pressure, fever- max 101.2, facial pain, bilateral ear pain and HA. Started 03/23/17.)   2d h/o facial pain, ear pain, sinus pressure, fever Tmax 101.2, severe ST. Body aches, back pain.   No cough, chest pain, dyspnea.   Husband recently sick with URI.  No h/o asthma.  Relevant past medical, surgical, family and social history reviewed and updated as indicated. Interim medical history since our last visit reviewed. Allergies and medications reviewed and updated. Outpatient Medications Prior to Visit  Medication Sig Dispense Refill  . aspirin 81 MG tablet Take 81 mg by mouth every Monday, Wednesday, and Friday.     Marland Kitchen atenolol (TENORMIN) 50 MG tablet Take 1 tablet (50 mg total) by mouth daily. 90 tablet 1  . Blood Glucose Monitoring Suppl (ONE TOUCH ULTRA SYSTEM KIT) W/DEVICE KIT 1 kit by Does not apply route once. 1 each 0  . cyanocobalamin (V-R VITAMIN B-12) 500 MCG tablet Take 1 tablet (500 mcg total) by mouth daily.    Marland Kitchen glucose blood (ONE TOUCH ULTRA TEST) test strip Use to check sugar daily. Dx: E11.9 100 each 3  . Lancet Device MISC One touch delica - Use as directed 1 each 1  . Lancets Misc. (ACCU-CHEK SOFTCLIX LANCET DEV) KIT Use at home to test blood sugars daily. 1 kit 0  . levothyroxine (SYNTHROID, LEVOTHROID) 112 MCG tablet Take 1 tablet (112 mcg total) by mouth daily. 90 tablet 1  . losartan (COZAAR) 50 MG tablet Take 1 tablet (50 mg total) by mouth daily. 90 tablet 1  . montelukast (SINGULAIR) 10 MG tablet Take 1 tablet (10 mg total) by mouth daily. 90 tablet 1  . mupirocin ointment  (BACTROBAN) 2 % Place 1 application into the nose 2 (two) times daily. 22 g 0  . ONETOUCH DELICA LANCETS FINE MISC Use 1-2 times daily as needed 100 each 6  . doxycycline (VIBRA-TABS) 100 MG tablet Take 1 tablet (100 mg total) by mouth 2 (two) times daily. 20 tablet 0  . fluticasone (FLONASE) 50 MCG/ACT nasal spray Place 2 sprays into both nostrils daily. 16 g 6  . Multiple Vitamins-Minerals (HAIR SKIN AND NAILS FORMULA PO) Take by mouth daily.    Marland Kitchen omeprazole (PRILOSEC) 40 MG capsule Take 1 capsule (40 mg total) by mouth daily. Take daily for 3 weeks then as needed 30 capsule 3   No facility-administered medications prior to visit.      Per HPI unless specifically indicated in ROS section below Review of Systems     Objective:    BP 118/72 (BP Location: Left Arm, Patient Position: Sitting, Cuff Size: Normal)   Pulse (!) 56   Temp 98 F (36.7 C) (Oral)   Wt 170 lb (77.1 kg)   SpO2 96%   BMI 31.09 kg/m   Wt Readings from Last 3 Encounters:  03/25/17 170 lb (77.1 kg)  02/04/17 175 lb (79.4 kg)  01/27/17 174 lb 4 oz (79 kg)    Physical Exam  Constitutional: She appears  well-developed and well-nourished. No distress.  HENT:  Head: Normocephalic and atraumatic.  Right Ear: Hearing, tympanic membrane, external ear and ear canal normal.  Left Ear: Hearing, tympanic membrane, external ear and ear canal normal.  Nose: Mucosal edema present. No rhinorrhea. Right sinus exhibits no maxillary sinus tenderness and no frontal sinus tenderness. Left sinus exhibits no maxillary sinus tenderness and no frontal sinus tenderness.  Mouth/Throat: Uvula is midline and mucous membranes are normal. Posterior oropharyngeal edema and posterior oropharyngeal erythema (marked) present. No oropharyngeal exudate or tonsillar abscesses.  Eyes: Conjunctivae and EOM are normal. Pupils are equal, round, and reactive to light. No scleral icterus.  Neck: Normal range of motion. Neck supple.  Cardiovascular: Normal  rate, regular rhythm, normal heart sounds and intact distal pulses.  No murmur heard. Pulmonary/Chest: Effort normal and breath sounds normal. No respiratory distress. She has no wheezes. She has no rales.  Lymphadenopathy:    She has no cervical adenopathy.  Skin: Skin is warm and dry. No rash noted.  Nursing note and vitals reviewed.  Results for orders placed or performed in visit on 03/25/17  POCT rapid strep A  Result Value Ref Range   Rapid Strep A Screen Negative Negative      Assessment & Plan:   Problem List Items Addressed This Visit    Influenza - Primary    Clinical diagnosis (we have no flu swabs available).  Rx tamiflu given age/comorbidities. Further supportive care as per instructions.  Update if not improving with treatment. Pt agrees with plan.       Relevant Medications   oseltamivir (TAMIFLU) 75 MG capsule   Type 2 diabetes, controlled, with peripheral neuropathy ([place])    Other Visit Diagnoses    Sore throat       Relevant Orders   POCT rapid strep A (Completed)       Follow up plan: Return if symptoms worsen or fail to improve.  Ria Bush, MD

## 2017-04-15 ENCOUNTER — Telehealth: Payer: Self-pay | Admitting: Family Medicine

## 2017-04-15 DIAGNOSIS — R1013 Epigastric pain: Secondary | ICD-10-CM

## 2017-04-15 NOTE — Telephone Encounter (Signed)
Copied from [place] [id number]. Topic: Quick Communication - See Telephone Encounter >> Apr 15, 2017  1:19 PM Ether Griffins B wrote: CRM for notification. See Telephone encounter for:  Pt would like recommendation for a female OBGYN.  04/15/17.

## 2017-04-15 NOTE — Telephone Encounter (Signed)
I have seen her for upper abd pain and bloating, not for lower abdominal or lower back pain or dysuria. These are new symptoms. Recommend eval in office prior to GYN referral. If pt declines, would recommend proceeding with CT scan abd/pelvis. If ongoing dysuria, would still need to come in for eval for UTI.

## 2017-04-15 NOTE — Telephone Encounter (Signed)
Pt said for years have cyst on ovary; for a while pt could not say how long pt has had lower abd pain, bloating, low back pain with burning upon urination. Pt has not had pap in 3 yrs and pt wants to see female GYN. Pt did not want to make appt at [facility]. Please advise. Last annual 01/27/17.

## 2017-04-18 NOTE — Telephone Encounter (Signed)
Attempted to contact pt again. Lvm asking pt to call back.  Need to relay Dr. Bosie Clos message and schedule OV, if necessary.

## 2017-04-18 NOTE — Telephone Encounter (Signed)
Left message on vm for pt to call back.  Need to relay message per Dr. Darnell Level and schedule OV, if necessary.

## 2017-04-18 NOTE — Telephone Encounter (Signed)
Patient calling again back, triage RN unavailable. Call back [phone]

## 2017-04-19 NOTE — Telephone Encounter (Signed)
Spoke with pt relaying Dr. Synthia Innocent message about pt's abd and back pain.  Says she just wants to go to GYN.  Says she was told by a nurse-friend of hers, she did not need a referral to see GYN. Informed her that is usually true.  Says she will call a GYN office she has in mind but will let us know if she needs anything from Dr. Darnell Level. Send to Dr. Darnell Level as fyi.

## 2017-04-19 NOTE — Telephone Encounter (Signed)
Copied from [place]. Topic: Quick Communication - See Telephone Encounter >> Apr 19, 2017  9:44 AM Lolita Rieger, RMA wrote: CRM for notification. See Telephone encounter for:   04/19/17.pt returning call

## 2017-04-21 ENCOUNTER — Encounter: Payer: Self-pay | Admitting: Family Medicine

## 2017-04-21 ENCOUNTER — Ambulatory Visit (INDEPENDENT_AMBULATORY_CARE_PROVIDER_SITE_OTHER)
Admission: RE | Admit: 2017-04-21 | Discharge: 2017-04-21 | Disposition: A | Discharge status: Home / Self Care | Payer: Medicare Other | Source: Ambulatory Visit | Attending: Family Medicine | Admitting: Family Medicine

## 2017-04-21 ENCOUNTER — Ambulatory Visit (INDEPENDENT_AMBULATORY_CARE_PROVIDER_SITE_OTHER): Payer: Medicare Other | Admitting: Family Medicine

## 2017-04-21 VITALS — BP 120/68 | HR 60 | Temp 97.9°F | Wt 170.0 lb

## 2017-04-21 DIAGNOSIS — J019 Acute sinusitis, unspecified: Secondary | ICD-10-CM | POA: Diagnosis not present

## 2017-04-21 DIAGNOSIS — R059 Cough, unspecified: Secondary | ICD-10-CM

## 2017-04-21 DIAGNOSIS — R1031 Right lower quadrant pain: Secondary | ICD-10-CM | POA: Diagnosis not present

## 2017-04-21 DIAGNOSIS — E739 Lactose intolerance, unspecified: Secondary | ICD-10-CM | POA: Insufficient documentation

## 2017-04-21 DIAGNOSIS — R05 Cough: Secondary | ICD-10-CM | POA: Diagnosis not present

## 2017-04-21 DIAGNOSIS — R300 Dysuria: Secondary | ICD-10-CM | POA: Diagnosis not present

## 2017-04-21 LAB — POC URINALSYSI DIPSTICK (AUTOMATED)
Glucose, UA: NEGATIVE
Nitrite, UA: NEGATIVE
PH UA: 6 (ref 5.0–8.0)
UROBILINOGEN UA: 0.2 U/dL

## 2017-04-21 MED ORDER — MUPIROCIN 2 % EX OINT: 1.0000 "application " | TOPICAL_OINTMENT | Freq: Two times a day (BID) | CUTANEOUS | 0 refills | Status: DC

## 2017-04-21 MED ORDER — DOXYCYCLINE HYCLATE 100 MG PO TABS: 100.0000 mg | ORAL_TABLET | Freq: Two times a day (BID) | ORAL | 0 refills | Status: DC

## 2017-04-21 NOTE — Progress Notes (Signed)
BP 120/68 (BP Location: Left Arm, Patient Position: Sitting, Cuff Size: Normal)   Pulse 60   Temp 97.9 F (36.6 C) (Oral)   Wt 170 lb (77.1 kg)   SpO2 97%   BMI 31.09 kg/m    CC: persistent illness Subjective:    Patient ID: [name], female    DOB: [birth year], 83 y.o.   MRN: [id number]  HPI: [name] is a 83 y.o. female presenting on 04/21/2017 for URI (States she feels better but now has productive cough and hoarseness, which started 04/18/17.)   Seen 1 month ago with clinical dx influenza treated with tamiflu. She did improve after treatment. Acute worsening over last 3 days associated with hoarseness, gen weakness (in bed all day), fever. Productive cough, blood tinged sputum today. No appetite. ST. Itchy ears. Sinus pressure worse yesterday.   No HA.  Has tried robitussin for cough.  No h/o asthma.   Requests return to OBGYN to eval h/o ovarian cyst, along with ongoing lower abd pain, bloating, R pelvic discomfort, R lower back pain, dysuria. We had not evaluated her for this in the past. Last pap 3 yrs ago per patient. Last pelvic imaging reviewed 2013 [place] - R ovarian cyst had resolved.   Epigastric pain - omeprazole caused nausea/dizziness. She has been treating with gas X. Restarted omeprazole last week with intermittent effect. Lactose intolerance.   Relevant past medical, surgical, family and social history reviewed and updated as indicated. Interim medical history since our last visit reviewed. Allergies and medications reviewed and updated. Outpatient Medications Prior to Visit  Medication Sig Dispense Refill  . aspirin 81 MG tablet Take 81 mg by mouth every Monday, Wednesday, and Friday.     Marland Kitchen atenolol (TENORMIN) 50 MG tablet Take 1 tablet (50 mg total) by mouth daily. 90 tablet 1  . Blood Glucose Monitoring Suppl (ONE TOUCH ULTRA SYSTEM KIT) W/DEVICE KIT 1 kit by Does not apply route once. 1 each 0  . cyanocobalamin (V-R VITAMIN B-12) 500 MCG tablet Take 1  tablet (500 mcg total) by mouth daily.    Marland Kitchen glucose blood (ONE TOUCH ULTRA TEST) test strip Use to check sugar daily. Dx: E11.9 100 each 3  . Lancet Device MISC One touch delica - Use as directed 1 each 1  . Lancets Misc. (ACCU-CHEK SOFTCLIX LANCET DEV) KIT Use at home to test blood sugars daily. 1 kit 0  . levothyroxine (SYNTHROID, LEVOTHROID) 112 MCG tablet Take 1 tablet (112 mcg total) by mouth daily. 90 tablet 1  . losartan (COZAAR) 50 MG tablet Take 1 tablet (50 mg total) by mouth daily. 90 tablet 1  . montelukast (SINGULAIR) 10 MG tablet Take 1 tablet (10 mg total) by mouth daily. 90 tablet 1  . ONETOUCH DELICA LANCETS FINE MISC Use 1-2 times daily as needed 100 each 6  . mupirocin ointment (BACTROBAN) 2 % Place 1 application into the nose 2 (two) times daily. 22 g 0  . oseltamivir (TAMIFLU) 75 MG capsule Take 1 capsule (75 mg total) by mouth 2 (two) times daily. 10 capsule 0   No facility-administered medications prior to visit.      Per HPI unless specifically indicated in ROS section below Review of Systems     Objective:    BP 120/68 (BP Location: Left Arm, Patient Position: Sitting, Cuff Size: Normal)   Pulse 60   Temp 97.9 F (36.6 C) (Oral)   Wt 170 lb (77.1 kg)   SpO2 97%  BMI 31.09 kg/m   Wt Readings from Last 3 Encounters:  04/21/17 170 lb (77.1 kg)  03/25/17 170 lb (77.1 kg)  02/04/17 175 lb (79.4 kg)    Physical Exam  Constitutional: She appears well-developed and well-nourished. No distress.  HENT:  Head: Normocephalic and atraumatic.  Right Ear: Hearing, tympanic membrane, external ear and ear canal normal.  Left Ear: Hearing, tympanic membrane, external ear and ear canal normal.  Nose: Mucosal edema (nasal pallor) present. No rhinorrhea. Right sinus exhibits no maxillary sinus tenderness and no frontal sinus tenderness. Left sinus exhibits no maxillary sinus tenderness and no frontal sinus tenderness.  Mouth/Throat: Uvula is midline and mucous membranes  are normal. Posterior oropharyngeal edema and posterior oropharyngeal erythema present. No oropharyngeal exudate or tonsillar abscesses.  Eyes: Conjunctivae and EOM are normal. Pupils are equal, round, and reactive to light. No scleral icterus.  Neck: Normal range of motion. Neck supple.  Cardiovascular: Normal rate, regular rhythm, normal heart sounds and intact distal pulses.  No murmur heard. Pulmonary/Chest: Effort normal and breath sounds normal. No respiratory distress. She has no wheezes. She has no rales.  Lungs clear Nagging cough present  Lymphadenopathy:    She has no cervical adenopathy.  Skin: Skin is warm and dry. No rash noted.  Nursing note and vitals reviewed.  Results for orders placed or performed in visit on 04/21/17  POCT Urinalysis Dipstick (Automated)  Result Value Ref Range   Color, UA yellow    Clarity, UA clear    Glucose, UA negative    Bilirubin, UA 1+    Ketones, UA trace    Spec Grav, UA >=1.030 (A) 1.010 - 1.025   Blood, UA 1+    pH, UA 6.0 5.0 - 8.0   Protein, UA trace    Urobilinogen, UA 0.2 0.2 or 1.0 E.U./dL   Nitrite, UA negative    Leukocytes, UA Small (1+) (A) Negative   Lab Results  Component Value Date   HGBA1C 6.7 (H) 01/26/2017    Lab Results  Component Value Date   CREATININE 0.77 11/26/2016       Assessment & Plan:   Problem List Items Addressed This Visit    Acute sinusitis - Primary    Anticipate acute bacterial sinusitis complication after influenza dx last month - will treat with 10d doxy course. Check CXR today as well. Further supportive care as per instructions - NSAID, robitussin.       Relevant Medications   doxycycline (VIBRA-TABS) 100 MG tablet   Lactose intolerance    Limiting lactose helps symptoms - using lactaid.       Right lower quadrant abdominal pain    Endorses longstanding history of lower abdominal discomfort localized to RLQ/R pelvis/R lower back. H/o ovarian cyst, however latest pelvic imaging 2013  with resolution. Given ongoing symptoms as well as longstanding epigastric pain/bloating, I think it most prudent to recheck pelvic imaging r/o cyst, other ovarian pathology. Pt agrees with plan.  For endorsed dysuria over the past month - check UA today - UA possibly contaminated - I have asked pt to recollect clean catch with discussion how to collect, and will send this off for UCx.       Relevant Orders   US Pelvis Complete   US Transvaginal Non-OB    Other Visit Diagnoses    Cough       Relevant Orders   DG Chest 2 View   Dysuria       Relevant Orders  POCT Urinalysis Dipstick (Automated) (Completed)   Urine Culture       Meds ordered this encounter  Medications  . doxycycline (VIBRA-TABS) 100 MG tablet    Sig: Take 1 tablet (100 mg total) by mouth 2 (two) times daily.    Dispense:  20 tablet    Refill:  0  . mupirocin ointment (BACTROBAN) 2 %    Sig: Place 1 application into the nose 2 (two) times daily.    Dispense:  22 g    Refill:  0   Orders Placed This Encounter  Procedures  . Urine Culture  . DG Chest 2 View    Standing Status:   Future    Number of Occurrences:   1    Standing Expiration Date:   06/22/2018    Order Specific Question:   Reason for Exam (SYMPTOM  OR DIAGNOSIS REQUIRED)    Answer:   worsening cough after flu last month    Order Specific Question:   Preferred imaging location?    Answer:   [facility]    Order Specific Question:   Radiology Contrast Protocol - do NOT remove file path    Answer:   [email or web address]  . US Pelvis Complete    Standing Status:   Future    Standing Expiration Date:   06/22/2018    Order Specific Question:   Reason for Exam (SYMPTOM  OR DIAGNOSIS REQUIRED)    Answer:   R pelvic pain, bloating, h/o ovarian cyst    Order Specific Question:   Preferred imaging location?    Answer:   [facility]  . US Transvaginal Non-OB    Standing Status:   Future    Standing  Expiration Date:   06/22/2018    Order Specific Question:   Reason for Exam (SYMPTOM  OR DIAGNOSIS REQUIRED)    Answer:   R pelvic pain, bloating, h/o ovarian cyst    Order Specific Question:   Preferred imaging location?    Answer:   [facility]  . POCT Urinalysis Dipstick (Automated)    Follow up plan: No Follow-up on file.  Ria Bush, MD

## 2017-04-21 NOTE — Assessment & Plan Note (Signed)
Limiting lactose helps symptoms - using lactaid.

## 2017-04-21 NOTE — Assessment & Plan Note (Signed)
Anticipate acute bacterial sinusitis complication after influenza dx last month - will treat with 10d doxy course. Check CXR today as well. Further supportive care as per instructions - NSAID, robitussin.

## 2017-04-21 NOTE — Assessment & Plan Note (Addendum)
Endorses longstanding history of lower abdominal discomfort localized to RLQ/R pelvis/R lower back. H/o ovarian cyst, however latest pelvic imaging 2013 with resolution. Given ongoing symptoms as well as longstanding epigastric pain/bloating, I think it most prudent to recheck pelvic imaging r/o cyst, other ovarian pathology. Pt agrees with plan.  For endorsed dysuria over the past month - check UA today - UA possibly contaminated - I have asked pt to recollect clean catch with discussion how to collect, and will send this off for UCx.

## 2017-04-21 NOTE — Patient Instructions (Addendum)
Creo que tiene una sinusitis - tome doxycilina 100mg  dos veces al dia con comida.  Siga robitussin para tos.  Tome ibuprofeno 400mg  con comida por los proximos 3-4 dias.  Chest xray today.  Urinalysis today.  Pelvic ultrasound ordered.

## 2017-04-22 LAB — URINE CULTURE
MICRO NUMBER:: 90294353
Result:: NO GROWTH
SPECIMEN QUALITY:: ADEQUATE

## 2017-04-26 ENCOUNTER — Ambulatory Visit
Admission: RE | Admit: 2017-04-26 | Discharge: 2017-04-26 | Disposition: A | Discharge status: Home / Self Care | Payer: Medicare Other | Source: Ambulatory Visit | Attending: Family Medicine | Admitting: Family Medicine

## 2017-04-26 DIAGNOSIS — R140 Abdominal distension (gaseous): Secondary | ICD-10-CM | POA: Diagnosis not present

## 2017-04-26 DIAGNOSIS — R102 Pelvic and perineal pain: Secondary | ICD-10-CM | POA: Diagnosis not present

## 2017-04-26 DIAGNOSIS — R1031 Right lower quadrant pain: Secondary | ICD-10-CM | POA: Diagnosis not present

## 2017-05-12 DIAGNOSIS — B351 Tinea unguium: Secondary | ICD-10-CM | POA: Diagnosis not present

## 2017-05-12 DIAGNOSIS — M79671 Pain in right foot: Secondary | ICD-10-CM | POA: Diagnosis not present

## 2017-05-12 DIAGNOSIS — D2371 Other benign neoplasm of skin of right lower limb, including hip: Secondary | ICD-10-CM | POA: Diagnosis not present

## 2017-05-19 ENCOUNTER — Encounter: Payer: Self-pay | Admitting: Family Medicine

## 2017-05-19 ENCOUNTER — Ambulatory Visit (INDEPENDENT_AMBULATORY_CARE_PROVIDER_SITE_OTHER): Payer: Medicare Other | Admitting: Family Medicine

## 2017-05-19 VITALS — BP 122/68 | HR 57 | Temp 97.8°F | Ht 62.0 in | Wt 172.2 lb

## 2017-05-19 DIAGNOSIS — E1142 Type 2 diabetes mellitus with diabetic polyneuropathy: Secondary | ICD-10-CM | POA: Diagnosis not present

## 2017-05-19 DIAGNOSIS — K130 Diseases of lips: Secondary | ICD-10-CM | POA: Diagnosis not present

## 2017-05-19 LAB — POCT GLYCOSYLATED HEMOGLOBIN (HGB A1C): Hemoglobin A1C: 6.8

## 2017-05-19 MED ORDER — ONETOUCH DELICA LANCETS FINE MISC: 3 refills | Status: DC

## 2017-05-19 NOTE — Progress Notes (Signed)
BP 122/68 (BP Location: Left Arm, Patient Position: Sitting, Cuff Size: Normal)   Pulse (!) 57   Temp 97.8 F (36.6 C) (Oral)   Ht _0  (1.575 m)   Wt 172 lb 4 oz (78.1 kg)   SpO2 96%   BMI 31.50 kg/m    CC: 4 mo f/u visit Subjective:    Patient ID: [name], female    DOB: 1934/08/15, 83 y.o.   MRN: [id number]  HPI: [name] is a 83 y.o. female presenting on 05/19/2017 for 4 mo follow-up (Pt needs printed 90-day rx for lancets.)   Recent sinusitis completely resolved after doxycycline course.  Intermittent cough - wonders if related to allergies. Treats this with robitussin with good effect.  Discoloration of lip that at times scales despite using chap stick.  Upcoming trip to [place] in June. Asks about PRN medication for diarrheal illness. Discussed loperamide use as long as no blood or fever.   DM - managed with diabetic diet. She recently saw podiatrist with treatment of painful callus.   Relevant past medical, surgical, family and social history reviewed and updated as indicated. Interim medical history since our last visit reviewed. Allergies and medications reviewed and updated. Outpatient Medications Prior to Visit  Medication Sig Dispense Refill  . aspirin 81 MG tablet Take 81 mg by mouth every Monday, Wednesday, and Friday.     Marland Kitchen atenolol (TENORMIN) 50 MG tablet Take 1 tablet (50 mg total) by mouth daily. 90 tablet 1  . Blood Glucose Monitoring Suppl (ONE TOUCH ULTRA SYSTEM KIT) W/DEVICE KIT 1 kit by Does not apply route once. 1 each 0  . cyanocobalamin (V-R VITAMIN B-12) 500 MCG tablet Take 1 tablet (500 mcg total) by mouth daily.    Marland Kitchen glucose blood (ONE TOUCH ULTRA TEST) test strip Use to check sugar daily. Dx: E11.9 100 each 3  . Lancet Device MISC One touch delica - Use as directed 1 each 1  . Lancets Misc. (ACCU-CHEK SOFTCLIX LANCET DEV) KIT Use at home to test blood sugars daily. 1 kit 0  . levothyroxine (SYNTHROID, LEVOTHROID) 112 MCG tablet Take 1  tablet (112 mcg total) by mouth daily. 90 tablet 1  . losartan (COZAAR) 50 MG tablet Take 1 tablet (50 mg total) by mouth daily. 90 tablet 1  . montelukast (SINGULAIR) 10 MG tablet Take 1 tablet (10 mg total) by mouth daily. 90 tablet 1  . mupirocin ointment (BACTROBAN) 2 % Place 1 application into the nose 2 (two) times daily. 22 g 0  . ONETOUCH DELICA LANCETS FINE MISC Use 1-2 times daily as needed 100 each 6  . doxycycline (VIBRA-TABS) 100 MG tablet Take 1 tablet (100 mg total) by mouth 2 (two) times daily. 20 tablet 0   No facility-administered medications prior to visit.      Per HPI unless specifically indicated in ROS section below Review of Systems     Objective:    BP 122/68 (BP Location: Left Arm, Patient Position: Sitting, Cuff Size: Normal)   Pulse (!) 57   Temp 97.8 F (36.6 C) (Oral)   Ht _1  (1.575 m)   Wt 172 lb 4 oz (78.1 kg)   SpO2 96%   BMI 31.50 kg/m   Wt Readings from Last 3 Encounters:  05/19/17 172 lb 4 oz (78.1 kg)  04/21/17 170 lb (77.1 kg)  03/25/17 170 lb (77.1 kg)    Physical Exam  Constitutional: She appears well-developed and well-nourished. No distress.  HENT:  Head: Normocephalic and atraumatic.  Right Ear: External ear normal.  Left Ear: External ear normal.  Nose: Nose normal.  Mouth/Throat: Oropharynx is clear and moist. No oropharyngeal exudate.  Eyes: Pupils are equal, round, and reactive to light. Conjunctivae and EOM are normal. No scleral icterus.  Neck: Normal range of motion. Neck supple.  Cardiovascular: Normal rate, regular rhythm, normal heart sounds and intact distal pulses.  No murmur heard. Pulmonary/Chest: Effort normal and breath sounds normal. No respiratory distress. She has no wheezes. She has no rales.  Musculoskeletal: She exhibits no edema.  See HPI for foot exam if done  Lymphadenopathy:    She has no cervical adenopathy.  Skin: Skin is warm and dry. No rash noted.  Bluish macule lower R lip  Psychiatric: She  has a normal mood and affect.  Nursing note and vitals reviewed.  Diabetic Foot Exam - Simple   Simple Foot Form Diabetic Foot exam was performed with the following findings:  Yes 05/19/2017 12:25 PM  Visual Inspection No deformities, no ulcerations, no other skin breakdown bilaterally:  Yes Sensation Testing See comments:  Yes Pulse Check Posterior Tibialis and Dorsalis pulse intact bilaterally:  Yes Comments Diminished sensation to monofilament testing throughout     Lab Results  Component Value Date   HGBA1C 6.8 05/19/2017       Assessment & Plan:   Problem List Items Addressed This Visit    Lesion of lip    Nevus vs venous lake - advised to monitor and let me know if enlarging or changing for derm referral.       Type 2 diabetes, controlled, with peripheral neuropathy ([place]) - Primary    POCT A1c today. Lancet Rx printed today - discussed based on good sugar control insurance may not cover sugar monitoring >1/day. Encouraged continued healthy diet choices.       Relevant Orders   POCT glycosylated hemoglobin (Hb A1C) (Completed)       Meds ordered this encounter  Medications  . ONETOUCH DELICA LANCETS FINE MISC    Sig: Use 1-2 times daily as needed E11.42    Dispense:  100 each    Refill:  3   Orders Placed This Encounter  Procedures  . POCT glycosylated hemoglobin (Hb A1C)  . HM DIABETES EYE EXAM    This external order was created through the Results Console.    Follow up plan: Return in about 4 months (around 09/18/2017) for follow up visit.  Ria Bush, MD

## 2017-05-19 NOTE — Patient Instructions (Addendum)
Puede comprar loperamide para diarrhea.  Revisar A1c hoy.  Revisar los pies diarios para asegurar que no haya ninguna herida.  Regresar en 4 meses para proxima visita

## 2017-05-19 NOTE — Assessment & Plan Note (Signed)
Nevus vs venous lake - advised to monitor and let me know if enlarging or changing for derm referral.

## 2017-05-19 NOTE — Assessment & Plan Note (Addendum)
POCT A1c today. Lancet Rx printed today - discussed based on good sugar control insurance may not cover sugar monitoring >1/day. Encouraged continued healthy diet choices.

## 2017-06-07 ENCOUNTER — Other Ambulatory Visit: Payer: Self-pay | Admitting: Family Medicine

## 2017-06-07 DIAGNOSIS — Z1231 Encounter for screening mammogram for malignant neoplasm of breast: Secondary | ICD-10-CM

## 2017-06-08 ENCOUNTER — Other Ambulatory Visit: Payer: Self-pay | Admitting: Family Medicine

## 2017-07-13 ENCOUNTER — Telehealth: Payer: Self-pay | Admitting: Family Medicine

## 2017-07-13 NOTE — Telephone Encounter (Signed)
Copied from [place] [phone] [phone]. Topic: Quick Communication - Rx Refill/Question >> Jul 13, 2017  4:02 PM Mcneil, Ja-Kwan wrote: Medication: montelukast (SINGULAIR) 10 MG tablet   Preferred Pharmacy (with phone number or street name): [facility], [place] [phone] (Phone) [phone] (Fax)  Agent: Please be advised that RX refills may take up to 3 business days. We ask that you follow-up with your pharmacy.

## 2017-07-14 DIAGNOSIS — H401133 Primary open-angle glaucoma, bilateral, severe stage: Secondary | ICD-10-CM | POA: Diagnosis not present

## 2017-07-14 NOTE — Telephone Encounter (Signed)
Pt request Rx refill of   Singular 10, mg  LOV:  10/12/16 with Dr. Danise Mina  Last refill:  06/08/17                                            Patient should have enough refills

## 2017-07-14 NOTE — Telephone Encounter (Signed)
Noted  

## 2017-07-14 NOTE — Telephone Encounter (Signed)
Lm on pts vm and advised to contact mail order pharmacy. Rx was filled #[id number] 05/2017

## 2017-07-19 ENCOUNTER — Ambulatory Visit
Admission: RE | Admit: 2017-07-19 | Discharge: 2017-07-19 | Disposition: A | Discharge status: Home / Self Care | Payer: Medicare Other | Source: Ambulatory Visit | Attending: Family Medicine | Admitting: Family Medicine

## 2017-07-19 DIAGNOSIS — Z1231 Encounter for screening mammogram for malignant neoplasm of breast: Secondary | ICD-10-CM

## 2017-08-10 ENCOUNTER — Ambulatory Visit: Payer: Self-pay | Admitting: *Deleted

## 2017-08-10 ENCOUNTER — Ambulatory Visit (INDEPENDENT_AMBULATORY_CARE_PROVIDER_SITE_OTHER): Payer: Medicare Other | Admitting: Family Medicine

## 2017-08-10 ENCOUNTER — Encounter: Payer: Self-pay | Admitting: Family Medicine

## 2017-08-10 VITALS — BP 118/70 | HR 68 | Temp 99.2°F | Ht 62.0 in | Wt 167.0 lb

## 2017-08-10 DIAGNOSIS — J019 Acute sinusitis, unspecified: Secondary | ICD-10-CM | POA: Diagnosis not present

## 2017-08-10 MED ORDER — IBUPROFEN 600 MG PO TABS: 600.0000 mg | ORAL_TABLET | Freq: Two times a day (BID) | ORAL | 0 refills | Status: DC | PRN

## 2017-08-10 NOTE — Telephone Encounter (Signed)
Pt has appt 08/10/17 at 1:30 with Dr Darnell Level.

## 2017-08-10 NOTE — Assessment & Plan Note (Signed)
Anticipate acute viral sinusitis given short duration, bilateral symptoms. Supportive care reviewed. rec limited ibuprofen course with food - discussed GI precautions - and she may continue her home cough syrup which is effective.  Red flags to update [place] for further eval or treatment reviewed. Pt and husband agree with plan.

## 2017-08-10 NOTE — Progress Notes (Signed)
 BP 118/70 (BP Location: Left Arm, Patient Position: Sitting, Cuff Size: Normal)   Pulse 68   Temp 99.2 F (37.3 C) (Oral)   Ht 5' 2" (1.575 m)   Wt 167 lb (75.8 kg)   SpO2 97%   BMI 30.54 kg/m    CC: cough, sinus pressure Subjective:    Patient ID: [name], female    DOB: 01/27/1935, 82 y.o.   MRN: [id number]  HPI: [name] is a 82 y.o. female presenting on 08/10/2017 for Cough (Dry cough since yesterday after returning from a trip out of the country. ) and Sinus Problem (Has severe sinus pressure, nasal congestion, bilateral ear pressure and a little nausea.)   Just arrived from trip to [place] with daughters and grandchildren (throughout [place]).   1d h/o sinus > chest congestion, cough productive of phlegm, runny nose, bilateral ear pain, sore throat with PNDrainage, headache, fever. Some nausea prior to feeling ill. Burning pain of mouth. Tmax at home 100.3.   No h/o asthma. No known sick contacts  Has treated with tylenol without much benefit. Has tried home cough syrup - unsure what type.   Relevant past medical, surgical, family and social history reviewed and updated as indicated. Interim medical history since our last visit reviewed. Allergies and medications reviewed and updated. Outpatient Medications Prior to Visit  Medication Sig Dispense Refill  . aspirin 81 MG tablet Take 81 mg by mouth every Monday, Wednesday, and Friday.     . atenolol (TENORMIN) 50 MG tablet TAKE 1 TABLET BY MOUTH  DAILY 90 tablet 2  . Blood Glucose Monitoring Suppl (ONE TOUCH ULTRA SYSTEM KIT) W/DEVICE KIT 1 kit by Does not apply route once. 1 each 0  . cyanocobalamin (V-R VITAMIN B-12) 500 MCG tablet Take 1 tablet (500 mcg total) by mouth daily.    . glucose blood (ONE TOUCH ULTRA TEST) test strip Use to check sugar daily. Dx: E11.9 100 each 3  . Lancet Device MISC One touch delica - Use as directed 1 each 1  . Lancets Misc. (ACCU-CHEK SOFTCLIX LANCET DEV) KIT Use at home to test  blood sugars daily. 1 kit 0  . levothyroxine (SYNTHROID, LEVOTHROID) 112 MCG tablet TAKE 1 TABLET BY MOUTH  DAILY 90 tablet 2  . losartan (COZAAR) 50 MG tablet TAKE 1 TABLET BY MOUTH  DAILY 90 tablet 2  . montelukast (SINGULAIR) 10 MG tablet TAKE 1 TABLET BY MOUTH  DAILY 90 tablet 2  . mupirocin ointment (BACTROBAN) 2 % Place 1 application into the nose 2 (two) times daily. 22 g 0  . ONETOUCH DELICA LANCETS FINE MISC Use 1-2 times daily as needed E11.42 100 each 3   No facility-administered medications prior to visit.      Per HPI unless specifically indicated in ROS section below Review of Systems     Objective:    BP 118/70 (BP Location: Left Arm, Patient Position: Sitting, Cuff Size: Normal)   Pulse 68   Temp 99.2 F (37.3 C) (Oral)   Ht 5' 2" (1.575 m)   Wt 167 lb (75.8 kg)   SpO2 97%   BMI 30.54 kg/m   Wt Readings from Last 3 Encounters:  08/10/17 167 lb (75.8 kg)  05/19/17 172 lb 4 oz (78.1 kg)  04/21/17 170 lb (77.1 kg)    Physical Exam  Constitutional: She appears well-developed and well-nourished. No distress.  HENT:  Head: Normocephalic and atraumatic.  Right Ear: Hearing, tympanic membrane, external ear and   ear canal normal.  Left Ear: Hearing, tympanic membrane, external ear and ear canal normal.  Nose: Mucosal edema and rhinorrhea present. Right sinus exhibits maxillary sinus tenderness. Right sinus exhibits no frontal sinus tenderness. Left sinus exhibits maxillary sinus tenderness and frontal sinus tenderness.  Mouth/Throat: Uvula is midline and mucous membranes are normal. Posterior oropharyngeal edema and posterior oropharyngeal erythema present. No oropharyngeal exudate or tonsillar abscesses.  Eyes: Pupils are equal, round, and reactive to light. Conjunctivae and EOM are normal. No scleral icterus.  Neck: Normal range of motion. Neck supple.  Cardiovascular: Normal rate, regular rhythm, normal heart sounds and intact distal pulses.  No murmur  heard. Pulmonary/Chest: Effort normal and breath sounds normal. No respiratory distress. She has no wheezes. She has no rales.  Cough present Lungs clear  Lymphadenopathy:    She has no cervical adenopathy.  Skin: Skin is warm and dry. No rash noted.  Nursing note and vitals reviewed.  Results for orders placed or performed in visit on 05/19/17  POCT glycosylated hemoglobin (Hb A1C)  Result Value Ref Range   Hemoglobin A1C 6.8   HM DIABETES EYE EXAM  Result Value Ref Range   HM Diabetic Eye Exam No Retinopathy No Retinopathy   Lab Results  Component Value Date   CREATININE 0.77 11/26/2016   BUN 18 11/26/2016   NA 138 11/26/2016   K 4.2 11/26/2016   CL 102 11/26/2016   CO2 31 11/26/2016       Assessment & Plan:   Problem List Items Addressed This Visit    Acute sinusitis - Primary    Anticipate acute viral sinusitis given short duration, bilateral symptoms. Supportive care reviewed. rec limited ibuprofen course with food - discussed GI precautions - and she may continue her home cough syrup which is effective.  Red flags to update us for further eval or treatment reviewed. Pt and husband agree with plan.           Meds ordered this encounter  Medications  . DISCONTD: ibuprofen (ADVIL,MOTRIN) 600 MG tablet    Sig: Take 1 tablet (600 mg total) by mouth 2 (two) times daily as needed for fever or moderate pain (take with food).    Dispense:  20 tablet    Refill:  0  . ibuprofen (ADVIL,MOTRIN) 600 MG tablet    Sig: Take 1 tablet (600 mg total) by mouth 2 (two) times daily as needed for fever or moderate pain (take with food).    Dispense:  14 tablet    Refill:  0    Use this #, not 20   No orders of the defined types were placed in this encounter.   Follow up plan: No follow-ups on file.  Javier Gutierrez, MD   

## 2017-08-10 NOTE — Telephone Encounter (Signed)
Pt called with nausea, cough, sore throat, and feeling tired. She was in [place] last week. T max is 99.7.  Feel like some shortness of breath at times. Stuffy and runny nose. Mouth breathing could be heard over the phone. Symptoms started yesterday.  Pt requesting an appointment for today. Taking her husband to the eye doctor for surgery Thursday morning.  Notified Rena at [facility] at [facility].  Appointment scheduled per [facility] for today. Pt advised to drink plenty of fluids, take Tylenol for any aches or pain and get plenty of rest until appt and until she is over this.  Pt voiced understanding.  Reason for Disposition . Patient is HIGH RISK (e.g., age > 26 years, pregnant, HIV+, or chronic medical condition)  Answer Assessment - Initial Assessment Questions 1. WORST SYMPTOM: "What is your worst symptom?" (e.g., cough, runny nose, muscle aches, headache, sore throat, fever)      fever 2. ONSET: "When did your flu symptoms start?"      yesterday 3. COUGH: "How bad is the cough?"       Non productive 4. RESPIRATORY DISTRESS: "Describe your breathing."      Stuffy nose 5. FEVER: "Do you have a fever?" If so, ask: "What is your temperature, how was it measured, and when did it start?"     99.7 6. EXPOSURE: "Were you exposed to someone with influenza?"       She did not think so, but last week she was in [place] 7. FLU VACCINE: "Did you get a flu shot this year?"     Yes in 2019 8. HIGH RISK DISEASE: "Do you any chronic medical problems?" (e.g., heart or lung disease, asthma, weak immune system, or other HIGH RISK conditions)     no 10. OTHER SYMPTOMS: "Do you have any other symptoms?"  (e.g., runny nose, muscle aches, headache, sore throat)       Sore throat, runny nose, nausea, headache off and on  Protocols used: INFLUENZA - SEASONAL-A-AH

## 2017-08-10 NOTE — Patient Instructions (Addendum)
Creo que tiene infeccion de sinuses Probable que sea infeccion viral.  Tome ibuprofeno 600mg  dos veces al dia con comida y siga jarabe de tos que tiene en casa.  Me deja saber si fiebre >101, tos productiva empeora or sintomas siguen mas de 7-10 dias - estos son signos de infeccion bacterial.   Sinusitis, en adultos Sinusitis, Adult La sinusitis es la inflamacin y Conservation officer, historic buildings en los senos paranasales. Los senos paranasales son espacios vacos en los huesos alrededor del rostro. Los senos paranasales se encuentran en estos lugares:  Alrededor de los ojos.  En la mitad de la frente.  Detrs de Mudlogger.  En los pmulos.  Los senos y las fosas nasales estn cubiertos de un lquido fibroso (mucosidad). Normalmente, la mucosidad drena a travs de los senos. Cuando los tejidos nasales se inflaman o hinchan, la mucosidad puede quedar atrapada o bloqueada, por lo que el aire no puede fluir por los senos paranasales. Esto fomenta la proliferacin de bacterias, virus y hongos, lo que produce infecciones. La sinusitis puede desarrollarse rpidamente y durar entre 7 y 10das (aguda) o ms de 12das (crnica). A menudo, esta afeccin surge despus de un resfriado. Cules son las causas? Esta afeccin es causada por cualquier sustancia que inflame los senos o evite que la mucosidad drene, por ejemplo:  Alergias.  Asma.  Infecciones virales o bacterianas.  Huesos con forma [employer] las fosas nasales.  Crecimientos nasales que contienen mucosidad (plipos nasales).  Aberturas sinusales estrechas.  Agentes contaminantes, como sustancias qumicas o irritantes presentes en el aire.  Un cuerpo extrao atorado [employer].  Infecciones por hongos. Esto es raro.  Qu incrementa el riesgo? Los siguientes factores pueden hacer que usted sea ms propenso a tener esta enfermedad:  Product manager o asma.  Haber tenido una infeccin reciente en las vas respiratorias superiores o un  resfriado.  Tener deformidades estructurales u obstrucciones en la nariz o los senos.  Tener un sistema inmunitario dbil.  Nadar o bucear mucho.  Abusar de los [employer].  Fumar.  Cules son los signos o los sntomas? Los principales sntomas de esta afeccin son dolor y sensacin de presin alrededor de los senos afectados. Otros sntomas pueden ser los siguientes:  Dolor en los dientes superiores.  Dolor de odos.  Dolor de [place].  Mal aliento.  Disminucin del sentido del olfato y del gusto.  Tos que empeora por la noche.  Fatiga.  Cristy Hilts.  Drenaje de mucosidad espesa que sale de la Lawyer. Generalmente, es de color verde y puede contener pus (purulento).  Nariz tapada o congestin nasal.  Goteo posnasal. Esto ocurre cuando se acumula mucosidad adicional en la garganta o la parte de atrs de la Lawyer.  Hinchazn y calor en los senos paranasales afectados.  Dolor de Investment banker, operational.  Sensibilidad a Naval architect.  Cmo se diagnostica? Esta enfermedad se diagnostica en funcin de los sntomas, los antecedentes mdicos y un examen fsico. Para descubrir si su afeccin es aguda o crnica, el mdico podra hacer lo siguiente:  Revisar su nariz en busca de plipos nasales.  Palpar los senos paranasales afectados para buscar signos de infeccin.  Observar la parte interna de los senos paranasales con un dispositivo que tiene una luz (endoscopio).  Si el mdico sospecha que usted padece sinusitis crnica, podra indicarle lo siguiente:  Pruebas de alergias.  Una muestra de mucosidad de la nariz (cultivo nasal) para buscar bacterias.  Examen de [place] de mucosidad, para ver si la  sinusitis se relaciona con alguna alergia.  Si la sinusitis no responde al tratamiento y dura ms de 8semanas, se le podra pedir una resonancia magntica (RM) o una exploracin por tomografa computarizada (TC) para examinar los senos paranasales. Estos estudios tambin ayudan a  Office manager gravedad de la infeccin. En contadas ocasiones, se puede ordenar una biopsia de hueso para descartar tipos ms graves de infecciones por hongos en los senos paranasales. Cmo se trata? El tratamiento para la sinusitis depende de la causa y de si la afeccin es [place]. Si lo que causa la sinusitis es un virus, los sntomas desparecern por s solos en el trmino de 10das. Podran recetarle medicamentos para [employer], entre los que se incluyen los siguientes:  Descongestivos nasales tpicos. [place] y permiten que la mucosidad drene por los senos paranasales.  Antihistamnicos. Este tipo de medicamento bloquea la inflamacin que ocasionan las [place]. Pueden ayudar a reducir la inflamacin en la nariz y los senos.  Corticoesteroides nasales tpicos. Son aerosoles nasales que reducen la inflamacin e hinchazn en la Doran Durand y los senos.  Lavados nasales con solucin salina. Estos enjuagues pueden ayudar a eliminar la mucosidad espesa en la nariz.  Si la afeccin es causada por una bacteria, se le recetarn antibiticos. Si es causada por un hongo, se le recetarn antimicticos. Se podra necesitar ciruga para tratar enfermedades preexistentes, como las fosas nasales estrechas. Tambin podra ser necesaria para eliminar plipos. Siga estas indicaciones en su casa: [employer], use o aplquese los medicamentos de venta libre y Editor, commissioning como se lo haya indicado el mdico. Estos pueden incluir aerosoles nasales.  Si le recetaron un antibitico, tmelo como se lo haya indicado el mdico. No deje de tomar el antibitico aunque comience a sentirse mejor. Hidrtese y humidifique los ambientes  Beba suficiente agua para Theatre manager la orina clara o de color amarillo plido. Mantenerse hidratado lo ayudar a [employer].  Use un humidificador de vapor fro para mantener la humedad de su hogar por encima del  50%.  Realice inhalaciones de vapor por 10 a 41minutos, de 3 a 4veces al da o tal como se lo haya indicado el mdico. Puede hacer esto en el bao con el vapor del agua caliente de la ducha.  Limite la exposicin al aire fro o seco. Reposo  Descanse todo lo que pueda.  Duerma con la cabeza levantada (elevada).  Asegrese de dormir lo suficiente cada noche. Instrucciones generales  Aplquese un pao tibio y hmedo en la cara 3 o 4veces al da o como se lo haya indicado el mdico. Esto ayuda a Actor las [place].  Lvese las manos frecuentemente con agua y jabn para reducir la exposicin a virus y otras bacterias. Use desinfectante para manos si no dispone de [place] y [place].  No fume. Evite estar cerca de personas que fuman (fumador pasivo).  Concurra a todas las visitas de seguimiento como se lo haya indicado el mdico. Esto es importante. Comunquese con un mdico si:  Tiene fiebre.  Los sntomas empeoran.  Los sntomas no mejoran en el trmino de 10das. Solicite ayuda de inmediato si:  Tiene un dolor de cabeza intenso.  Tiene vmitos persistentes.  Tiene dolor o hinchazn en la zona del rostro o los ojos.  Tiene problemas de visin.  Se siente confundido.  Tiene el cuello rgido.  Tiene dificultad para respirar. Esta informacin no tiene Marine scientist el consejo del mdico. Asegrese de hacerle al  mdico cualquier pregunta que tenga. Document Released: 11/11/2004 Document Revised: 06/15/2016 Document Reviewed: 11/27/2014 Elsevier Interactive Patient Education  [employer].

## 2017-09-20 ENCOUNTER — Ambulatory Visit (INDEPENDENT_AMBULATORY_CARE_PROVIDER_SITE_OTHER): Payer: Medicare Other | Admitting: Family Medicine

## 2017-09-20 ENCOUNTER — Encounter: Payer: Self-pay | Admitting: Family Medicine

## 2017-09-20 VITALS — BP 122/82 | HR 62 | Temp 98.2°F | Ht 62.0 in | Wt 169.5 lb

## 2017-09-20 DIAGNOSIS — R053 Chronic cough: Secondary | ICD-10-CM | POA: Insufficient documentation

## 2017-09-20 DIAGNOSIS — R05 Cough: Secondary | ICD-10-CM | POA: Insufficient documentation

## 2017-09-20 DIAGNOSIS — I10 Essential (primary) hypertension: Secondary | ICD-10-CM

## 2017-09-20 DIAGNOSIS — E1142 Type 2 diabetes mellitus with diabetic polyneuropathy: Secondary | ICD-10-CM

## 2017-09-20 DIAGNOSIS — R059 Cough, unspecified: Secondary | ICD-10-CM

## 2017-09-20 LAB — POCT GLYCOSYLATED HEMOGLOBIN (HGB A1C): HEMOGLOBIN A1C: 6.5 % — AB (ref 4.0–5.6)

## 2017-09-20 NOTE — Assessment & Plan Note (Signed)
Chronic, stable. Update A1c.  Continue controlling with healthy diet choices.

## 2017-09-20 NOTE — Assessment & Plan Note (Signed)
Chronic, stable. Continue current regimen. 

## 2017-09-20 NOTE — Patient Instructions (Addendum)
A1c today.  Trate zyrtec o claritin anti alergia diario por 2 semanas para tos - si no mejora con esto dejeme saber para referirla a pulmonologo.  Regresar en 4 meses (despues de 01/27/2018) para examen fisico.

## 2017-09-20 NOTE — Assessment & Plan Note (Signed)
Ongoing intermittent flares of coughing fits since recent respiratory infections earlier this year (influenza 02/2017, recurrent sinusitis x3).  Denies significant GERD sxs at this time. ?allergic rhinitis related despite regular use of singulair - will rec start daily antihistamine for 2 wk trial. If ongoing symptoms, will refer to pulm. Pt agrees with plan.

## 2017-09-20 NOTE — Progress Notes (Signed)
BP 122/82 (BP Location: Left Arm, Patient Position: Sitting, Cuff Size: Normal)   Pulse 62   Temp 98.2 F (36.8 C) (Oral)   Ht _0  (1.575 m)   Wt 169 lb 8 oz (76.9 kg)   SpO2 98%   BMI 31.00 kg/m    CC: 4 mo f/u visit Subjective:    Patient ID: [name], female    DOB: Jun 23, 1934, 83 y.o.   MRN: [id number]  HPI: [name] is a 83 y.o. female presenting on 09/20/2017 for 4 mo follow up (Pt accompanied by her husband.)   DM - does regularly check sugars fasting low 100s, occasionally checks at night time 150-180. Compliant with antihyperglycemic regimen which includes: diet controlled. Denies low sugars or hypoglycemic symptoms. Denies paresthesias. Last diabetic eye exam 01/2017. Pneumovax: 2013. Prevnar: 2015. Glucometer brand: one touch. DSME: remotely - declines refresher course.  Lab Results  Component Value Date   HGBA1C 6.5 (A) 09/20/2017   Diabetic Foot Exam - Simple   Simple Foot Form Diabetic Foot exam was performed with the following findings:  Yes 09/20/2017  1:05 PM  Visual Inspection No deformities, no ulcerations, no other skin breakdown bilaterally:  Yes Sensation Testing Intact to touch and monofilament testing bilaterally:  Yes Pulse Check Posterior Tibialis and Dorsalis pulse intact bilaterally:  Yes Comments Mildly diminished to monofilament R>L soles    Lab Results  Component Value Date   MICROALBUR 2.2 (H) 08/13/2015     Ongoing coughing fits that come and go. Not productive.  Takes singulair regularly. Some drainage down throat, some rhinorrhea, no congestion.  Abd bloating treated with gas X.  Relevant past medical, surgical, family and social history reviewed and updated as indicated. Interim medical history since our last visit reviewed. Allergies and medications reviewed and updated. Outpatient Medications Prior to Visit  Medication Sig Dispense Refill  . aspirin 81 MG tablet Take 81 mg by mouth every Monday, Wednesday, and  Friday.     Marland Kitchen atenolol (TENORMIN) 50 MG tablet TAKE 1 TABLET BY MOUTH  DAILY 90 tablet 2  . Blood Glucose Monitoring Suppl (ONE TOUCH ULTRA SYSTEM KIT) W/DEVICE KIT 1 kit by Does not apply route once. 1 each 0  . cyanocobalamin (V-R VITAMIN B-12) 500 MCG tablet Take 1 tablet (500 mcg total) by mouth daily.    Marland Kitchen glucose blood (ONE TOUCH ULTRA TEST) test strip Use to check sugar daily. Dx: E11.9 100 each 3  . Lancet Device MISC One touch delica - Use as directed 1 each 1  . Lancets Misc. (ACCU-CHEK SOFTCLIX LANCET DEV) KIT Use at home to test blood sugars daily. 1 kit 0  . levothyroxine (SYNTHROID, LEVOTHROID) 112 MCG tablet TAKE 1 TABLET BY MOUTH  DAILY 90 tablet 2  . losartan (COZAAR) 50 MG tablet TAKE 1 TABLET BY MOUTH  DAILY 90 tablet 2  . montelukast (SINGULAIR) 10 MG tablet TAKE 1 TABLET BY MOUTH  DAILY 90 tablet 2  . mupirocin ointment (BACTROBAN) 2 % Place 1 application into the nose 2 (two) times daily. 22 g 0  . ONETOUCH DELICA LANCETS FINE MISC Use 1-2 times daily as needed E11.42 100 each 3  . ibuprofen (ADVIL,MOTRIN) 600 MG tablet Take 1 tablet (600 mg total) by mouth 2 (two) times daily as needed for fever or moderate pain (take with food). 14 tablet 0   No facility-administered medications prior to visit.      Per HPI unless specifically indicated in ROS section  below Review of Systems     Objective:    BP 122/82 (BP Location: Left Arm, Patient Position: Sitting, Cuff Size: Normal)   Pulse 62   Temp 98.2 F (36.8 C) (Oral)   Ht _0  (1.575 m)   Wt 169 lb 8 oz (76.9 kg)   SpO2 98%   BMI 31.00 kg/m   Wt Readings from Last 3 Encounters:  09/20/17 169 lb 8 oz (76.9 kg)  08/10/17 167 lb (75.8 kg)  05/19/17 172 lb 4 oz (78.1 kg)    Physical Exam  Constitutional: She appears well-developed and well-nourished. No distress.  HENT:  Head: Normocephalic and atraumatic.  Mouth/Throat: Oropharynx is clear and moist. No oropharyngeal exudate.  Eyes: Pupils are equal, round,  and reactive to light. Conjunctivae and EOM are normal. No scleral icterus.  Neck: Normal range of motion. Neck supple.  Cardiovascular: Normal rate, regular rhythm, normal heart sounds and intact distal pulses.  No murmur heard. Pulmonary/Chest: Effort normal and breath sounds normal. No respiratory distress. She has no wheezes. She has no rales.  Musculoskeletal: She exhibits no edema.  See HPI for foot exam if done Varicose veins BLE   Lymphadenopathy:    She has no cervical adenopathy.  Skin: Skin is warm and dry. No rash noted.  Psychiatric: She has a normal mood and affect.  Nursing note and vitals reviewed.  Results for orders placed or performed in visit on 09/20/17  POCT glycosylated hemoglobin (Hb A1C)  Result Value Ref Range   Hemoglobin A1C 6.5 (A) 4.0 - 5.6 %   HbA1c POC (<> result, manual entry)  4.0 - 5.6 %   HbA1c, POC (prediabetic range)  5.7 - 6.4 %   HbA1c, POC (controlled diabetic range)  0.0 - 7.0 %      Assessment & Plan:   Problem List Items Addressed This Visit    Type 2 diabetes, controlled, with peripheral neuropathy ([place]) - Primary    Chronic, stable. Update A1c.  Continue controlling with healthy diet choices.       Relevant Orders   POCT glycosylated hemoglobin (Hb A1C) (Completed)   Hypertension    Chronic, stable. Continue current regimen.       Cough    Ongoing intermittent flares of coughing fits since recent respiratory infections earlier this year (influenza 02/2017, recurrent sinusitis x3).  Denies significant GERD sxs at this time. ?allergic rhinitis related despite regular use of singulair - will rec start daily antihistamine for 2 wk trial. If ongoing symptoms, will refer to pulm. Pt agrees with plan.           No orders of the defined types were placed in this encounter.  Orders Placed This Encounter  Procedures  . POCT glycosylated hemoglobin (Hb A1C)    Follow up plan: Return in about 4 months (around 01/27/2018) for  annual exam, prior fasting for blood work, medicare wellness visit.  Ria Bush, MD

## 2017-11-14 DIAGNOSIS — H401133 Primary open-angle glaucoma, bilateral, severe stage: Secondary | ICD-10-CM | POA: Diagnosis not present

## 2017-11-24 ENCOUNTER — Other Ambulatory Visit: Payer: Self-pay | Admitting: Family Medicine

## 2017-11-24 ENCOUNTER — Ambulatory Visit (INDEPENDENT_AMBULATORY_CARE_PROVIDER_SITE_OTHER): Payer: Medicare Other

## 2017-11-24 DIAGNOSIS — Z23 Encounter for immunization: Secondary | ICD-10-CM | POA: Diagnosis not present

## 2017-11-24 NOTE — Telephone Encounter (Signed)
Mupirocin oint Last rx:  04/21/17, #22 g Last OV:  09/20/17, f/u Next OV:  02/02/18, CPE

## 2017-11-24 NOTE — Telephone Encounter (Signed)
Pt is requesting a refill for mupirocin to be sent to [facility].

## 2017-11-27 MED ORDER — MUPIROCIN 2 % EX OINT: 1.0000 "application " | TOPICAL_OINTMENT | Freq: Two times a day (BID) | CUTANEOUS | 0 refills | Status: DC

## 2017-12-20 ENCOUNTER — Other Ambulatory Visit: Payer: Self-pay | Admitting: Family Medicine

## 2017-12-22 NOTE — Progress Notes (Signed)
BP 136/80 (BP Location: Right Arm, Patient Position: Sitting, Cuff Size: Normal)   Pulse 65   Temp 97.8 F (36.6 C) (Oral)   Ht 5' 2" (1.575 m)   Wt 172 lb (78 kg)   SpO2 95%   BMI 31.46 kg/m    CC: chest pain, nausea Subjective:    Patient ID: [name], female    DOB: September 02, 1934, 83 y.o.   MRN: [id number]  HPI: EVALEEN SANT is a 83 y.o. female presenting on 12/23/2017 for Nausea (C/o nausea that started about 2 wks ago. ) and Chest Pain (C/o occosional chest pain down left arm then sometimes right arm. Started about 1 mo ago. )   2 wk h/o nausea worse with meals especially greasy fatty meal. No vomiting. Associated with bloating. Mint can improve nausea temporarily. Also noticing L sided chest pain at rest described as sharp fleeting pains, no pressure/tightness. Also notes mild bilateral arm pain/heaviness. Denies dysphagia. No early satiety, good appetite.   No bowel changes - regularly stools 2-3 times daily. No fevers/chills.   Known chronic abdominal bloating treated with gasX.  Normal pelvic US 04/2017.  Normal abd [place] 01/2017.   Relevant past medical, surgical, family and social history reviewed and updated as indicated. Interim medical history since our last visit reviewed. Allergies and medications reviewed and updated. Outpatient Medications Prior to Visit  Medication Sig Dispense Refill  . aspirin 81 MG tablet Take 81 mg by mouth every Monday, Wednesday, and Friday.     Marland Kitchen atenolol (TENORMIN) 50 MG tablet TAKE 1 TABLET BY MOUTH  DAILY 90 tablet 2  . Blood Glucose Monitoring Suppl (ONE TOUCH ULTRA SYSTEM KIT) W/DEVICE KIT 1 kit by Does not apply route once. 1 each 0  . cyanocobalamin (V-R VITAMIN B-12) 500 MCG tablet Take 1 tablet (500 mcg total) by mouth daily.    Marland Kitchen glucose blood (ONE TOUCH ULTRA TEST) test strip USE AS DIRECTED TO CHECK BLOOD SUGAR ONCE A DAY 100 each 0  . Lancet Device MISC One touch delica - Use as directed 1 each 1  . Lancets Misc.  (ACCU-CHEK SOFTCLIX LANCET DEV) KIT Use at home to test blood sugars daily. 1 kit 0  . levothyroxine (SYNTHROID, LEVOTHROID) 112 MCG tablet TAKE 1 TABLET BY MOUTH  DAILY 90 tablet 2  . losartan (COZAAR) 50 MG tablet TAKE 1 TABLET BY MOUTH  DAILY 90 tablet 2  . montelukast (SINGULAIR) 10 MG tablet TAKE 1 TABLET BY MOUTH  DAILY 90 tablet 2  . mupirocin ointment (BACTROBAN) 2 % Place 1 application into the nose 2 (two) times daily. 22 g 0  . ONETOUCH DELICA LANCETS FINE MISC Use 1-2 times daily as needed E11.42 100 each 3   No facility-administered medications prior to visit.      Per HPI unless specifically indicated in ROS section below Review of Systems     Objective:    BP 136/80 (BP Location: Right Arm, Patient Position: Sitting, Cuff Size: Normal)   Pulse 65   Temp 97.8 F (36.6 C) (Oral)   Ht 5' 2" (1.575 m)   Wt 172 lb (78 kg)   SpO2 95%   BMI 31.46 kg/m   Wt Readings from Last 3 Encounters:  12/23/17 172 lb (78 kg)  09/20/17 169 lb 8 oz (76.9 kg)  08/10/17 167 lb (75.8 kg)    Physical Exam  Constitutional: She appears well-developed and well-nourished. No distress.  HENT:  Head: Normocephalic and atraumatic.  Mouth/Throat: Oropharynx is clear and moist. No oropharyngeal exudate.  Cardiovascular: Normal rate, regular rhythm and normal heart sounds.  No murmur heard. Pulmonary/Chest: Effort normal and breath sounds normal. No respiratory distress. She has no wheezes. She has no rales.  Abdominal: Soft. Bowel sounds are normal. She exhibits no distension and no mass. There is no tenderness. There is no rebound and no guarding. No hernia.  Musculoskeletal: She exhibits no edema.  Psychiatric: She has a normal mood and affect.  Nursing note and vitals reviewed.  Results for orders placed or performed in visit on 09/20/17  POCT glycosylated hemoglobin (Hb A1C)  Result Value Ref Range   Hemoglobin A1C 6.5 (A) 4.0 - 5.6 %   HbA1c POC (<> result, manual entry)  4.0 - 5.6 %    HbA1c, POC (prediabetic range)  5.7 - 6.4 %   HbA1c, POC (controlled diabetic range)  0.0 - 7.0 %   Lab Results  Component Value Date   TSH 1.56 01/26/2017      Assessment & Plan:  Over 25 minutes were spent face-to-face with the patient during this encounter and >50% of that time was spent on counseling and coordination of care  Problem List Items Addressed This Visit    Type 2 diabetes, controlled, with peripheral neuropathy (H[facility])   Nausea - Primary    Predominant symptom of nausea without significant abdominal discomfort, worse with greasy fatty meals. Reviewed last year's reassuring workup to date (labs, abd [place], pelvic US). ?SIBO - will trial align probiotic x2 wks and update with effect. zofran for nausea. Consider CT abd/pelvis for further evaluation vs GI referral. Consider carb breath test, consider rifaximin.  Chest discomfort does not sound cardiac in nature. Discussed this. ?GI etiology.      Fatty liver disease, nonalcoholic    Again reviewed with patient.           Meds ordered this encounter  Medications  . ondansetron (ZOFRAN) 4 MG tablet    Sig: Take 1 tablet (4 mg total) by mouth every 8 (eight) hours as needed for nausea or vomiting.    Dispense:  20 tablet    Refill:  0   No orders of the defined types were placed in this encounter.   Follow up plan: No follow-ups on file.  Ria Bush, MD

## 2017-12-23 ENCOUNTER — Encounter: Payer: Self-pay | Admitting: Family Medicine

## 2017-12-23 ENCOUNTER — Ambulatory Visit (INDEPENDENT_AMBULATORY_CARE_PROVIDER_SITE_OTHER): Payer: Medicare Other | Admitting: Family Medicine

## 2017-12-23 VITALS — BP 136/80 | HR 65 | Temp 97.8°F | Ht 62.0 in | Wt 172.0 lb

## 2017-12-23 DIAGNOSIS — R110 Nausea: Secondary | ICD-10-CM | POA: Diagnosis not present

## 2017-12-23 DIAGNOSIS — K760 Fatty (change of) liver, not elsewhere classified: Secondary | ICD-10-CM

## 2017-12-23 DIAGNOSIS — E1142 Type 2 diabetes mellitus with diabetic polyneuropathy: Secondary | ICD-10-CM | POA: Diagnosis not present

## 2017-12-23 MED ORDER — ONDANSETRON HCL 4 MG PO TABS: 4.0000 mg | ORAL_TABLET | Freq: Three times a day (TID) | ORAL | 0 refills | Status: DC | PRN

## 2017-12-23 NOTE — Assessment & Plan Note (Addendum)
Predominant symptom of nausea without significant abdominal discomfort, worse with greasy fatty meals. Reviewed last year's reassuring workup to date (labs, abd [place], pelvic US). ?SIBO - will trial align probiotic x2 wks and update with effect. zofran for nausea. Consider CT abd/pelvis for further evaluation vs GI referral. Consider carb breath test, consider rifaximin.  Chest discomfort does not sound cardiac in nature. Discussed this. ?GI etiology.

## 2017-12-23 NOTE — Assessment & Plan Note (Signed)
Again reviewed with patient

## 2017-12-23 NOTE — Patient Instructions (Addendum)
Labs today. Exclude gas producing foods (beans, onions, celery, carrots, raisins, bananas, apricots, prunes, brussel sprouts, wheat germ, pretzels) Trate Align probiotico sin receta para gases/indigestion. Puede usar zofran para la nausea.  Me deja saber si no mejora con esto para tomografia.

## 2017-12-27 ENCOUNTER — Other Ambulatory Visit: Payer: Self-pay

## 2018-01-25 ENCOUNTER — Other Ambulatory Visit: Payer: Self-pay | Admitting: Family Medicine

## 2018-01-26 DIAGNOSIS — H401133 Primary open-angle glaucoma, bilateral, severe stage: Secondary | ICD-10-CM | POA: Diagnosis not present

## 2018-01-26 DIAGNOSIS — L03213 Periorbital cellulitis: Secondary | ICD-10-CM | POA: Diagnosis not present

## 2018-01-26 DIAGNOSIS — R5381 Other malaise: Secondary | ICD-10-CM | POA: Diagnosis not present

## 2018-01-26 DIAGNOSIS — Z5321 Procedure and treatment not carried out due to patient leaving prior to being seen by health care provider: Secondary | ICD-10-CM | POA: Diagnosis not present

## 2018-01-26 NOTE — ED Triage Notes (Signed)
Patient reports she began taking Azithromycin on 12/12 for eye infection. Patient c/o malaise and "shaking".

## 2018-01-27 ENCOUNTER — Telehealth: Payer: Self-pay | Admitting: *Deleted

## 2018-01-27 ENCOUNTER — Emergency Department
Admission: EM | Admit: 2018-01-27 | Discharge: 2018-01-27 | Disposition: A | Discharge status: Home / Self Care | Payer: Medicare Other | Attending: Emergency Medicine | Admitting: Emergency Medicine

## 2018-01-27 LAB — COMPREHENSIVE METABOLIC PANEL
ALBUMIN: 4.2 g/dL (ref 3.5–5.0)
ALK PHOS: 77 U/L (ref 38–126)
ALT: 35 U/L (ref 0–44)
ANION GAP: 7 (ref 5–15)
AST: 24 U/L (ref 15–41)
BUN: 19 mg/dL (ref 8–23)
CALCIUM: 8.9 mg/dL (ref 8.9–10.3)
CHLORIDE: 105 mmol/L (ref 98–111)
CO2: 26 mmol/L (ref 22–32)
CREATININE: 0.83 mg/dL (ref 0.44–1.00)
GFR calc Af Amer: >60 mL/min (ref >60)
GFR calc non Af Amer: >60 mL/min (ref >60)
GLUCOSE: 138 mg/dL — AB (ref 70–99)
Potassium: 3.7 mmol/L (ref 3.5–5.1)
Sodium: 138 mmol/L (ref 135–145)
Total Bilirubin: 0.6 mg/dL (ref 0.3–1.2)
Total Protein: 7.1 g/dL (ref 6.5–8.1)

## 2018-01-27 LAB — URINALYSIS, COMPLETE (UACMP) WITH MICROSCOPIC
BILIRUBIN URINE: NEGATIVE
Glucose, UA: NEGATIVE mg/dL
HGB URINE DIPSTICK: NEGATIVE
Ketones, ur: NEGATIVE mg/dL
NITRITE: NEGATIVE
PROTEIN: NEGATIVE mg/dL
SPECIFIC GRAVITY, URINE: 1.006 (ref 1.005–1.030)
pH: 7 (ref 5.0–8.0)

## 2018-01-27 LAB — CBC WITH DIFFERENTIAL/PLATELET
ABS IMMATURE GRANULOCYTES: 0.02 10*3/uL (ref 0.00–0.07)
BASOS ABS: 0 10*3/uL (ref 0.0–0.1)
Basophils Relative: 0 %
Eosinophils Absolute: 0.2 10*3/uL (ref 0.0–0.5)
Eosinophils Relative: 3 %
HCT: 42.9 % (ref 36.0–46.0)
HEMOGLOBIN: 14.1 g/dL (ref 12.0–15.0)
Immature Granulocytes: 0 %
LYMPHS PCT: 33 %
Lymphs Abs: 2.5 10*3/uL (ref 0.7–4.0)
MCH: 29.6 pg (ref 26.0–34.0)
MCHC: 32.9 g/dL (ref 30.0–36.0)
MCV: 90.1 fL (ref 80.0–100.0)
Monocytes Absolute: 0.7 10*3/uL (ref 0.1–1.0)
Monocytes Relative: 9 %
NEUTROS ABS: 4.3 10*3/uL (ref 1.7–7.7)
NEUTROS PCT: 55 %
NRBC: 0 % (ref 0.0–0.2)
PLATELETS: 220 10*3/uL (ref 150–400)
RBC: 4.76 MIL/uL (ref 3.87–5.11)
RDW: 13.5 % (ref 11.5–15.5)
WBC: 7.7 10*3/uL (ref 4.0–10.5)

## 2018-01-27 LAB — TROPONIN I: Troponin I: <0.03 ng/mL (ref <0.03)

## 2018-01-27 NOTE — Telephone Encounter (Signed)
Spoke to pts husband who states pts was d/c from the hospital and he was advised to contact PCP regarding results. pls advise

## 2018-01-27 NOTE — ED Notes (Signed)
Pt to  STAT desk with family; st that she is leaving and will f/u with PCP in the morning; instr to return for any new or worsening symptoms

## 2018-01-27 NOTE — Telephone Encounter (Signed)
Spoke with pt relaying results per Dr. Darnell Level.  Pt verbalizes understanding.  States she is feeling much better. Says her eye is still red but improving.

## 2018-01-27 NOTE — Telephone Encounter (Signed)
Plz notify labs and urine returned normal.  How is she feeling? Seen at ER for med reaction?

## 2018-01-30 ENCOUNTER — Other Ambulatory Visit: Payer: Self-pay | Admitting: Family Medicine

## 2018-01-30 DIAGNOSIS — E039 Hypothyroidism, unspecified: Secondary | ICD-10-CM

## 2018-01-30 DIAGNOSIS — K760 Fatty (change of) liver, not elsewhere classified: Secondary | ICD-10-CM

## 2018-01-30 DIAGNOSIS — E1142 Type 2 diabetes mellitus with diabetic polyneuropathy: Secondary | ICD-10-CM

## 2018-01-30 DIAGNOSIS — E538 Deficiency of other specified B group vitamins: Secondary | ICD-10-CM

## 2018-01-31 ENCOUNTER — Ambulatory Visit: Payer: Medicare Other

## 2018-01-31 ENCOUNTER — Ambulatory Visit (INDEPENDENT_AMBULATORY_CARE_PROVIDER_SITE_OTHER): Payer: Medicare Other

## 2018-01-31 VITALS — BP 138/84 | HR 56 | Temp 97.6°F | Ht 62.75 in | Wt 173.5 lb

## 2018-01-31 DIAGNOSIS — Z0000 Encounter for general adult medical examination without abnormal findings: Secondary | ICD-10-CM

## 2018-01-31 DIAGNOSIS — E1142 Type 2 diabetes mellitus with diabetic polyneuropathy: Secondary | ICD-10-CM | POA: Diagnosis not present

## 2018-01-31 DIAGNOSIS — E538 Deficiency of other specified B group vitamins: Secondary | ICD-10-CM | POA: Diagnosis not present

## 2018-01-31 DIAGNOSIS — R7989 Other specified abnormal findings of blood chemistry: Secondary | ICD-10-CM

## 2018-01-31 DIAGNOSIS — K760 Fatty (change of) liver, not elsewhere classified: Secondary | ICD-10-CM

## 2018-01-31 DIAGNOSIS — E039 Hypothyroidism, unspecified: Secondary | ICD-10-CM | POA: Diagnosis not present

## 2018-01-31 LAB — COMPREHENSIVE METABOLIC PANEL
ALK PHOS: 65 U/L (ref 39–117)
ALT: 29 U/L (ref 0–35)
AST: 18 U/L (ref 0–37)
Albumin: 4.3 g/dL (ref 3.5–5.2)
BILIRUBIN TOTAL: 0.7 mg/dL (ref 0.2–1.2)
BUN: 16 mg/dL (ref 6–23)
CALCIUM: 9.6 mg/dL (ref 8.4–10.5)
CO2: 28 meq/L (ref 19–32)
Chloride: 103 mEq/L (ref 96–112)
Creatinine, Ser: 0.81 mg/dL (ref 0.40–1.20)
GFR: 72.13 mL/min (ref >60.00)
Glucose, Bld: 123 mg/dL — ABNORMAL HIGH (ref 70–99)
Potassium: 4.2 mEq/L (ref 3.5–5.1)
Sodium: 140 mEq/L (ref 135–145)
Total Protein: 7.1 g/dL (ref 6.0–8.3)

## 2018-01-31 LAB — VITAMIN B12: Vitamin B-12: 564 pg/mL (ref 211–911)

## 2018-01-31 LAB — LIPID PANEL
CHOLESTEROL: 132 mg/dL (ref 0–200)
HDL: 50.2 mg/dL (ref >39.00)
LDL Cholesterol: 65 mg/dL (ref 0–99)
NonHDL: 81.65
Total CHOL/HDL Ratio: 3
Triglycerides: 84 mg/dL (ref 0.0–149.0)
VLDL: 16.8 mg/dL (ref 0.0–40.0)

## 2018-01-31 LAB — HEMOGLOBIN A1C: HEMOGLOBIN A1C: 6.9 % — AB (ref 4.6–6.5)

## 2018-01-31 LAB — T4, FREE: Free T4: 0.81 ng/dL (ref 0.60–1.60)

## 2018-01-31 LAB — TSH: TSH: 3.17 u[IU]/mL (ref 0.35–4.50)

## 2018-01-31 NOTE — Progress Notes (Signed)
PCP notes:   Health maintenance:  A1C - completed Tetanus vaccine - postponed/insurance  Abnormal screenings:   Fall risk - hx of single fall Fall Risk  01/31/2018 01/27/2017 12/19/2015 08/13/2015 12/13/2014  Falls in the past year? 1 No No No Yes  Comment lost balance and fell on floor in bedroom - - - -  Number falls in past yr: 0 - - - -  Injury with Fall? 0 - - - No    Patient concerns:   None  Nurse concerns:  None  Next PCP appt:   02/02/18 @ 1130

## 2018-01-31 NOTE — Progress Notes (Signed)
Subjective:   [name] is a 83 y.o. female who presents for Medicare Annual (Subsequent) preventive examination.  Review of Systems:  N/A Cardiac Risk Factors include: advanced age (>43mn, >>41women);diabetes mellitus;obesity (BMI >30kg/m2);hypertension     Objective:     Vitals: BP 138/84 (BP Location: Left Arm, Patient Position: Sitting, Cuff Size: Normal)   Pulse (!) 56   Temp 97.6 F (36.4 C) (Oral)   Ht 5' 2.75" (1.594 m) Comment: shoes  Wt 173 lb 8 oz (78.7 kg)   SpO2 97%   BMI 30.98 kg/m   Body mass index is 30.98 kg/m.  Advanced Directives 01/27/2018 07/22/2016 12/19/2015 12/13/2014  Does Patient Have a Medical Advance Directive? No No No No  Would patient like information on creating a medical advance directive? No - Patient declined No - Patient declined No - patient declined information Yes - [employer]ist, clinical (histocompatibility and immunogenetics)given    Tobacco Social History   Tobacco Use  Smoking Status Never Smoker  Smokeless Tobacco Never Used     Counseling given: No   Clinical Intake:  Pre-visit preparation completed: Yes  Pain : No/denies pain Pain Score: 0-No pain     Nutritional Status: BMI 25 -29 Overweight Nutritional Risks: None Diabetes: No  How often do you need to have someone help you when you read instructions, pamphlets, or other written materials from your doctor or pharmacy?: 1 - Never What is the last grade level you completed in school?: 12th grade  Interpreter Needed?: No  Comments: pt lives with spouse Information entered by :: LPinson, LPN  Past Medical History:  Diagnosis Date  . Allergy    hay fever  . Arthritis   . Cataract    resolved with surgery  . Chicken pox   . Colon polyp   . Diabetes mellitus 2008  . Generalized headaches    thinks caused by glaucoma  . Glaucoma   . Hypertension   . Right sided sciatica 07/03/2013  . Thyroid disease    hypothyroidism  . Urinary incontinence   . UTI (urinary tract infection)    Past  Surgical History:  Procedure Laterality Date  . APPENDECTOMY  1998  . BUNIONECTOMY    . CHOLECYSTECTOMY    . COLONOSCOPY  2007   int hem, poor bowel prep, normal barium enema in following ([facility])  . COLONOSCOPY WITH PROPOFOL N/A 07/22/2016   diverticulosis (Allen Norris Darren, MD)  . EYE SURGERY     R  eye-lid drop surgery  . TONSILLECTOMY     Family History  Problem Relation Age of Onset  . Heart disease Mother   . Asthma Mother   . Heart attack Mother 655      aneurysm ruptured by heart  . Heart disease Father   . Deep vein thrombosis Father   . Leukemia Brother   . Cancer Brother   . Diabetes Sister   . Crohn's disease Brother   . Diabetes Brother   . Arthritis Other   . Diabetes Other   . Cancer Maternal Aunt        ovarian?  . Cancer Maternal Grandfather        colon   Social History   Socioeconomic History  . Marital status: Married    Spouse name: Not on file  . Number of children: Not on file  . Years of education: Not on file  . Highest education level: Not on file  Occupational History  . Not on file  Social  Needs  . Financial resource strain: Not on file  . Food insecurity:    Worry: Not on file    Inability: Not on file  . Transportation needs:    Medical: Not on file    Non-medical: Not on file  Tobacco Use  . Smoking status: Never Smoker  . Smokeless tobacco: Never Used  Substance and Sexual Activity  . Alcohol use: Yes    Alcohol/week: 2.0 standard drinks    Types: 2 Glasses of wine per week    Comment: occasional  . Drug use: No  . Sexual activity: Yes  Lifestyle  . Physical activity:    Days per week: Not on file    Minutes per session: Not on file  . Stress: Not on file  Relationships  . Social connections:    Talks on phone: Not on file    Gets together: Not on file    Attends religious service: Not on file    Active member of club or organization: Not on file    Attends meetings of clubs or organizations: Not on file     Relationship status: Not on file  Other Topics Concern  . Not on file  Social History Narrative   Lives in [place], from [place]. Daughter lives here. 4 daughters.   From [place]   Work - retired Data processing manager   Diet - regular   Exercise - bike daily at home    Outpatient Encounter Medications as of 01/31/2018  Medication Sig  . aspirin 81 MG tablet Take 81 mg by mouth every Monday, Wednesday, and Friday.   Marland Kitchen atenolol (TENORMIN) 50 MG tablet TAKE 1 TABLET BY MOUTH  DAILY  . Blood Glucose Monitoring Suppl (ONE TOUCH ULTRA SYSTEM KIT) W/DEVICE KIT 1 kit by Does not apply route once.  . cyanocobalamin (V-R VITAMIN B-12) 500 MCG tablet Take 1 tablet (500 mcg total) by mouth daily.  Marland Kitchen glucose blood (ONE TOUCH ULTRA TEST) test strip USE AS DIRECTED TO CHECK BLOOD SUGAR ONCE A DAY  . Lancet Device MISC One touch delica - Use as directed  . Lancets Misc. (ACCU-CHEK SOFTCLIX LANCET DEV) KIT Use at home to test blood sugars daily.  Marland Kitchen levothyroxine (SYNTHROID, LEVOTHROID) 112 MCG tablet TAKE 1 TABLET BY MOUTH  DAILY  . losartan (COZAAR) 50 MG tablet TAKE 1 TABLET BY MOUTH  DAILY  . montelukast (SINGULAIR) 10 MG tablet TAKE 1 TABLET BY MOUTH  DAILY  . mupirocin ointment (BACTROBAN) 2 % Place 1 application into the nose 2 (two) times daily.  . ondansetron (ZOFRAN) 4 MG tablet Take 1 tablet (4 mg total) by mouth every 8 (eight) hours as needed for nausea or vomiting.  Glory Rosebush DELICA LANCETS FINE MISC Use 1-2 times daily as needed E11.42   No facility-administered encounter medications on file as of 01/31/2018.     Activities of Daily Living In your present state of health, do you have any difficulty performing the following activities: 01/31/2018  Hearing? Y  Vision? N  Difficulty concentrating or making decisions? Y  Walking or climbing stairs? Y  Dressing or bathing? N  Doing errands, shopping? N  Preparing Food and eating ? N  Using the Toilet? N  In the past six months, have you accidently  leaked urine? Y  Do you have problems with loss of bowel control? N  Managing your Medications? N  Managing your Finances? N  Housekeeping or managing your Housekeeping? N  Some recent data might be hidden  Patient Care Team: Ria Bush, MD as PCP - General (Family Medicine) Karren Burly Deirdre Peer, MD as Referring Physician (Ophthalmology)    Assessment:   This is a routine wellness examination for Kariann.   Hearing Screening   125Hz  250Hz  500Hz  1000Hz  2000Hz  3000Hz  4000Hz  6000Hz  8000Hz   Right ear:   40 40 40  40    Left ear:   40 40 40  40    Vision Screening Comments: Vision exam in Dec 2019 with Dr. Nathaniel Man   Exercise Activities and Dietary recommendations Current Exercise Habits: Home exercise routine, Type of exercise: Other - see comments(stationary bike), Time (Minutes): 30, Frequency (Times/Week): 7, Weekly Exercise (Minutes/Week): 210, Intensity: Mild, Exercise limited by: None identified  Goals    . Patient Stated     Starting 01/31/2018, I will continue to exercise on stationary bike for 30 minutes daily.        Fall Risk Fall Risk  01/31/2018 01/27/2017 12/19/2015 08/13/2015 12/13/2014  Falls in the past year? 1 No No No Yes  Comment lost balance and fell on floor in bedroom - - - -  Number falls in past yr: 0 - - - -  Injury with Fall? 0 - - - No   Depression Screen PHQ 2/9 Scores 01/31/2018 01/27/2017 12/19/2015 08/13/2015  PHQ - 2 Score 0 0 0 0  PHQ- 9 Score 0 - - -     Cognitive Function MMSE - Mini Mental State Exam 01/31/2018 12/19/2015 12/13/2014  Orientation to time 5 5 5   Orientation to Place 5 5 5   Registration 3 3 3   Attention/ Calculation 0 0 5  Recall 3 3 3   Language- name 2 objects 0 0 2  Language- repeat 1 1 1   Language- follow 3 step command 3 3 3   Language- read & follow direction 0 0 1  Write a sentence 0 0 1  Copy design 0 0 1  Total score 20 20 30        PLEASE NOTE: A Mini-Cog screen was completed. Maximum score  is 20. A value of 0 denotes this part of Folstein MMSE was not completed or the patient failed this part of the Mini-Cog screening.   Mini-Cog Screening Orientation to Time - Max 5 pts Orientation to Place - Max 5 pts Registration - Max 3 pts Recall - Max 3 pts Language Repeat - Max 1 pts Language Follow 3 Step Command - Max 3 pts   Immunization History  Administered Date(s) Administered  . Influenza Split 11/29/2011  . Influenza, High Dose Seasonal PF 12/13/2014  . Influenza,inj,Quad PF,6+ Mos 12/15/2012, 11/21/2013, 11/13/2015, 10/26/2016, 11/24/2017  . Pneumococcal Conjugate-13 03/23/2013  . Pneumococcal Polysaccharide-23 07/22/1998, 12/31/2011    Screening Tests Health Maintenance  Topic Date Due  . DTaP/Tdap/Td (1 - Tdap) 02/15/2019 (Originally 01/27/1946)  . TETANUS/TDAP  02/15/2019 (Originally 01/27/1954)  . MAMMOGRAM  07/20/2018  . HEMOGLOBIN A1C  08/02/2018  . FOOT EXAM  09/21/2018  . OPHTHALMOLOGY EXAM  01/25/2019  . INFLUENZA VACCINE  Completed  . DEXA SCAN  Completed  . PNA vac Low Risk Adult  Completed      Plan:     I have personally reviewed, addressed, and noted the following in the patient's chart:  A. Medical and social history B. Use of alcohol, tobacco or illicit drugs  C. Current medications and supplements D. Functional ability and status E.  Nutritional status F.  Physical activity G. Advance directives H. List of other physicians I.  Hospitalizations,  surgeries, and ER visits in previous 12 months J.  [place] to include hearing, vision, cognitive, depression L. Referrals and appointments - none  In addition, I have reviewed and discussed with patient certain preventive protocols, quality metrics, and best practice recommendations. A written personalized care plan for preventive services as well as general preventive health recommendations were provided to patient.  See attached scanned questionnaire for additional information.     Signed,   Lindell Noe, MHA, BS, LPN Health Coach

## 2018-01-31 NOTE — Patient Instructions (Signed)
[name] , Thank you for taking time to come for your Medicare Wellness Visit. I appreciate your ongoing commitment to your health goals. Please review the following plan we discussed and let me know if I can assist you in the future.   These are the goals we discussed: Goals    . Patient Stated     Starting 01/31/2018, I will continue to exercise on stationary bike for 30 minutes daily.        This is a list of the screening recommended for you and due dates:  Health Maintenance  Topic Date Due  . DTaP/Tdap/Td vaccine (1 - Tdap) 02/15/2019*  . Tetanus Vaccine  02/15/2019*  . Mammogram  07/20/2018  . Hemoglobin A1C  08/02/2018  . Complete foot exam   09/21/2018  . Eye exam for diabetics  01/25/2019  . Flu Shot  Completed  . DEXA scan (bone density measurement)  Completed  . Pneumonia vaccines  Completed  *Topic was postponed. The date shown is not the original due date.   Preventive Care for Adults  A healthy lifestyle and preventive care can promote health and wellness. Preventive health guidelines for adults include the following key practices.  . A routine yearly physical is a good way to check with your health care provider about your health and preventive screening. It is a chance to share any concerns and updates on your health and to receive a thorough exam.  . Visit your dentist for a routine exam and preventive care every 6 months. Brush your teeth twice a day and floss once a day. Good oral hygiene prevents tooth decay and gum disease.  . The frequency of eye exams is based on your age, health, family medical history, use  of contact lenses, and other factors. Follow your health care provider's recommendations for frequency of eye exams.  . Eat a healthy diet. Foods like vegetables, fruits, whole grains, low-fat dairy products, and lean protein foods contain the nutrients you need without too many calories. Decrease your intake of foods high in solid fats, added  sugars, and salt. Eat the right amount of calories for you. Get information about a proper diet from your health care provider, if necessary.  . Regular physical exercise is one of the most important things you can do for your health. Most adults should get at least 150 minutes of moderate-intensity exercise (any activity that increases your heart rate and causes you to sweat) each week. In addition, most adults need muscle-strengthening exercises on 2 or more days a week.  Silver Sneakers may be a benefit available to you. To determine eligibility, you may visit the website: www.silversneakers.com or contact program at 587-655-2317 Mon-Fri between 8AM-8PM.   . Maintain a healthy weight. The body mass index (BMI) is a screening tool to identify possible weight problems. It provides an estimate of body fat based on height and weight. Your health care provider can find your BMI and can help you achieve or maintain a healthy weight.   For adults 20 years and older: ? A BMI below 18.5 is considered underweight. ? A BMI of 18.5 to 24.9 is normal. ? A BMI of 25 to 29.9 is considered overweight. ? A BMI of 30 and above is considered obese.   . Maintain normal blood lipids and cholesterol levels by exercising and minimizing your intake of saturated fat. Eat a balanced diet with plenty of fruit and vegetables. Blood tests for lipids and cholesterol should begin at age  20 and be repeated every 5 years. If your lipid or cholesterol levels are high, you are over 50, or you are at high risk for heart disease, you may need your cholesterol levels checked more frequently. Ongoing high lipid and cholesterol levels should be treated with medicines if diet and exercise are not working.  . If you smoke, find out from your health care provider how to quit. If you do not use tobacco, please do not start.  . If you choose to drink alcohol, please do not consume more than 2 drinks per day. One drink is considered to  be 12 ounces (355 mL) of beer, 5 ounces (148 mL) of wine, or 1.5 ounces (44 mL) of liquor.  . If you are 53-65 years old, ask your health care provider if you should take aspirin to prevent strokes.  . Use sunscreen. Apply sunscreen liberally and repeatedly throughout the day. You should seek shade when your shadow is shorter than you. Protect yourself by wearing long sleeves, pants, a wide-brimmed hat, and sunglasses year round, whenever you are outdoors.  . Once a month, do a whole body skin exam, using a mirror to look at the skin on your back. Tell your health care provider of new moles, moles that have irregular borders, moles that are larger than a pencil eraser, or moles that have changed in shape or color.

## 2018-02-02 ENCOUNTER — Encounter: Payer: Self-pay | Admitting: Family Medicine

## 2018-02-02 ENCOUNTER — Ambulatory Visit (INDEPENDENT_AMBULATORY_CARE_PROVIDER_SITE_OTHER): Payer: Medicare Other | Admitting: Family Medicine

## 2018-02-02 VITALS — BP 132/82 | HR 58 | Temp 97.8°F | Ht 62.75 in | Wt 173.0 lb

## 2018-02-02 DIAGNOSIS — I10 Essential (primary) hypertension: Secondary | ICD-10-CM | POA: Diagnosis not present

## 2018-02-02 DIAGNOSIS — M8580 Other specified disorders of bone density and structure, unspecified site: Secondary | ICD-10-CM | POA: Diagnosis not present

## 2018-02-02 DIAGNOSIS — K760 Fatty (change of) liver, not elsewhere classified: Secondary | ICD-10-CM

## 2018-02-02 DIAGNOSIS — Z7189 Other specified counseling: Secondary | ICD-10-CM

## 2018-02-02 DIAGNOSIS — E039 Hypothyroidism, unspecified: Secondary | ICD-10-CM | POA: Diagnosis not present

## 2018-02-02 DIAGNOSIS — Z8619 Personal history of other infectious and parasitic diseases: Secondary | ICD-10-CM

## 2018-02-02 DIAGNOSIS — E538 Deficiency of other specified B group vitamins: Secondary | ICD-10-CM

## 2018-02-02 DIAGNOSIS — E1142 Type 2 diabetes mellitus with diabetic polyneuropathy: Secondary | ICD-10-CM

## 2018-02-02 DIAGNOSIS — Z0001 Encounter for general adult medical examination with abnormal findings: Secondary | ICD-10-CM

## 2018-02-02 DIAGNOSIS — M5442 Lumbago with sciatica, left side: Secondary | ICD-10-CM

## 2018-02-02 DIAGNOSIS — M5416 Radiculopathy, lumbar region: Secondary | ICD-10-CM | POA: Insufficient documentation

## 2018-02-02 NOTE — Assessment & Plan Note (Addendum)
Exam not consistent with hip osteoarthritis - anticipate L lower bck pain with L sciatica. Provided with piriformis exercises. Discussed tylenol use PRN for discomfort. If home exercises not helpful, will recommend formal PT course. Pt agrees with plan.

## 2018-02-02 NOTE — Progress Notes (Signed)
BP 132/82 (BP Location: Left Arm, Patient Position: Sitting, Cuff Size: Normal)   Pulse (!) 58   Temp 97.8 F (36.6 C) (Oral)   Ht 5' 2.75" (1.594 m)   Wt 173 lb (78.5 kg)   SpO2 96%   BMI 30.89 kg/m    CC: CPE Subjective:    Patient ID: [name], female    DOB: January 29, 1935, 83 y.o.   MRN: [id number]  HPI: [name] is a 83 y.o. female presenting on 02/02/2018 for Annual Exam (Pt 2. Pt brought in home BP monitor to compare. Hers read, 143/90 in office. Pt accompanied by her husband, [name].)   Saw Lesia last week for medicare wellness visit. Note reviewed.    Saw [facility] for glaucoma and preseptal cellulitis of eyelid - treated with zpack and warm compresses. Seen at ER for possible reaction after azithromycin (uncontrolled tremors), left without being seen. Labs were unrevealing. This has improved. Husband thinks she may have had a panic attack.   Some L lower back pain with radiation to LLQ abd and down left leg. Denies inciting trauma/injury. She did have a fall without injury over the summer. Some hip pain worse with ambulation. No significant imbalance or paresthesias/numbness of leg.   She has been taking mupirocin for facial rash Align probiotic caused worsening abdominal bloating/pressure.   Preventative: COLONOSCOPY WITH PROPOFOL 07/22/2016 diverticulosis Allen Norris, Darren, MD) Lung cancer screening -notindicated Well woman exam -Last normal pap 2014.[place] out. Breast cancer screening -mammo WNL6/2019 DEXA [id number] T -1.5 hip osteopenia.  Flu shot -yearly Tetanus shot -declines prevnar 2015, pneumovax 2013 Shingrix - discussed, declines Advanced directive discussion -does not have available. Planning on seeing lawyer to work on this. Would want daughter Hassan Rowan to be HCPOA. Seat belt use discussed Sunscreen use discussed.No changing moles on skin. Non smoker Alcohol rare. Dentist due - last seen 3 yrs ago Eye exam regularly Q4  months  Lives in [place], from [place]. Daughter lives here. 4daughters Work - retired Data processing manager Diet -avoids sugar Exercise -stationarybike daily at home  Relevant past medical, surgical, family and social history reviewed and updated as indicated. Interim medical history since our last visit reviewed. Allergies and medications reviewed and updated. Outpatient Medications Prior to Visit  Medication Sig Dispense Refill  . aspirin 81 MG tablet Take 81 mg by mouth every Monday, Wednesday, and Friday.     Marland Kitchen atenolol (TENORMIN) 50 MG tablet TAKE 1 TABLET BY MOUTH  DAILY 90 tablet 2  . Blood Glucose Monitoring Suppl (ONE TOUCH ULTRA SYSTEM KIT) W/DEVICE KIT 1 kit by Does not apply route once. 1 each 0  . cyanocobalamin (V-R VITAMIN B-12) 500 MCG tablet Take 1 tablet (500 mcg total) by mouth daily.    Marland Kitchen glucose blood (ONE TOUCH ULTRA TEST) test strip USE AS DIRECTED TO CHECK BLOOD SUGAR ONCE A DAY 100 each 0  . Lancet Device MISC One touch delica - Use as directed 1 each 1  . Lancets Misc. (ACCU-CHEK SOFTCLIX LANCET DEV) KIT Use at home to test blood sugars daily. 1 kit 0  . levothyroxine (SYNTHROID, LEVOTHROID) 112 MCG tablet TAKE 1 TABLET BY MOUTH  DAILY 90 tablet 2  . losartan (COZAAR) 50 MG tablet TAKE 1 TABLET BY MOUTH  DAILY 90 tablet 2  . montelukast (SINGULAIR) 10 MG tablet TAKE 1 TABLET BY MOUTH  DAILY 90 tablet 2  . mupirocin ointment (BACTROBAN) 2 % Place 1 application into the nose 2 (two) times daily.  22 g 0  . ondansetron (ZOFRAN) 4 MG tablet Take 1 tablet (4 mg total) by mouth every 8 (eight) hours as needed for nausea or vomiting. 20 tablet 0  . ONETOUCH DELICA LANCETS FINE MISC Use 1-2 times daily as needed E11.42 100 each 3   No facility-administered medications prior to visit.      Per HPI unless specifically indicated in ROS section below Review of Systems  Constitutional: Negative for activity change, appetite change, chills, fatigue, fever and unexpected weight change.    HENT: Negative for hearing loss.   Eyes: Negative for visual disturbance.  Respiratory: Positive for cough (intermittently) and shortness of breath. Negative for chest tightness and wheezing.   Cardiovascular: Negative for chest pain, palpitations and leg swelling.  Gastrointestinal: Positive for nausea (has improved). Negative for abdominal distention, abdominal pain, blood in stool, constipation, diarrhea and vomiting.       Nausea may have been brought on by levothyroxine.   Endocrine:       Ongoing hair loss  Genitourinary: Negative for difficulty urinating and hematuria.  Musculoskeletal: Negative for arthralgias, myalgias and neck pain.  Skin: Negative for rash.  Neurological: Negative for dizziness, seizures, syncope and headaches.  Hematological: Negative for adenopathy. Does not bruise/bleed easily.  Psychiatric/Behavioral: Negative for dysphoric mood. The patient is not nervous/anxious.        Objective:    BP 132/82 (BP Location: Left Arm, Patient Position: Sitting, Cuff Size: Normal)   Pulse (!) 58   Temp 97.8 F (36.6 C) (Oral)   Ht 5' 2.75" (1.594 m)   Wt 173 lb (78.5 kg)   SpO2 96%   BMI 30.89 kg/m   Wt Readings from Last 3 Encounters:  02/02/18 173 lb (78.5 kg)  01/31/18 173 lb 8 oz (78.7 kg)  01/27/18 169 lb (76.7 kg)    Physical Exam Vitals signs and nursing note reviewed.  Constitutional:      General: She is not in acute distress.    Appearance: She is well-developed.  HENT:     Head: Normocephalic and atraumatic.     Right Ear: Hearing, tympanic membrane, ear canal and external ear normal.     Left Ear: Hearing, tympanic membrane, ear canal and external ear normal.     Nose: Nose normal.     Mouth/Throat:     Pharynx: Uvula midline. No oropharyngeal exudate or posterior oropharyngeal erythema.  Eyes:     General: No scleral icterus.    Conjunctiva/sclera: Conjunctivae normal.     Pupils: Pupils are equal, round, and reactive to light.  Neck:      Musculoskeletal: Normal range of motion and neck supple.     Vascular: No carotid bruit.  Cardiovascular:     Rate and Rhythm: Normal rate and regular rhythm.     Pulses:          Radial pulses are 2+ on the right side and 2+ on the left side.     Heart sounds: Normal heart sounds. No murmur.  Pulmonary:     Effort: Pulmonary effort is normal. No respiratory distress.     Breath sounds: Normal breath sounds. No wheezing or rales.  Abdominal:     General: Bowel sounds are normal. There is no distension.     Palpations: Abdomen is soft. There is no mass.     Tenderness: There is no abdominal tenderness. There is no guarding or rebound.  Musculoskeletal: Normal range of motion.     Comments: No pain  midline spine No paraspinous mm tenderness Neg SLR bilaterally. No pain with int/ext rotation at hip. No pain at SIJ, GTB or sciatic notch bilaterally.    Lymphadenopathy:     Cervical: No cervical adenopathy.  Skin:    General: Skin is warm and dry.     Findings: No rash.  Neurological:     Mental Status: She is alert and oriented to person, place, and time.     Comments: CN grossly intact, station intact 5/5 strength BLE Sensation intact Antalgic gait L leg   Psychiatric:        Behavior: Behavior normal.        Thought Content: Thought content normal.        Judgment: Judgment normal.    Results for orders placed or performed in visit on 01/31/18  Vitamin B12  Result Value Ref Range   Vitamin B-12 564 211 - 911 pg/mL  T4, free  Result Value Ref Range   Free T4 0.81 0.60 - 1.60 ng/dL  Comprehensive metabolic panel  Result Value Ref Range   Sodium 140 135 - 145 mEq/L   Potassium 4.2 3.5 - 5.1 mEq/L   Chloride 103 96 - 112 mEq/L   CO2 28 19 - 32 mEq/L   Glucose, Bld 123 (H) 70 - 99 mg/dL   BUN 16 6 - 23 mg/dL   Creatinine, Ser 0.81 0.40 - 1.20 mg/dL   Total Bilirubin 0.7 0.2 - 1.2 mg/dL   Alkaline Phosphatase 65 39 - 117 U/L   AST 18 0 - 37 U/L   ALT 29 0 - 35 U/L    Total Protein 7.1 6.0 - 8.3 g/dL   Albumin 4.3 3.5 - 5.2 g/dL   Calcium 9.6 8.4 - 10.5 mg/dL   GFR 72.13 >60.00 mL/min  TSH  Result Value Ref Range   TSH 3.17 0.35 - 4.50 uIU/mL  Hemoglobin A1c  Result Value Ref Range   Hgb A1c MFr Bld 6.9 (H) 4.6 - 6.5 %  Lipid panel  Result Value Ref Range   Cholesterol 132 0 - 200 mg/dL   Triglycerides 84.0 0.0 - 149.0 mg/dL   HDL 50.20 >39.00 mg/dL   VLDL 16.8 0.0 - 40.0 mg/dL   LDL Cholesterol 65 0 - 99 mg/dL   Total CHOL/HDL Ratio 3    NonHDL 81.65       Assessment & Plan:   Problem List Items Addressed This Visit    Type 2 diabetes, controlled, with peripheral neuropathy ([place])    Deteriorated control noted - recommend stricter adherence to low sugar low carb diabetic diet.       Osteopenia    Reviewed with patient. Will repeat DEXA 07/2018 when she returns for next mammogram.       Relevant Orders   DG Bone Density   Low vitamin B12 level    Levels stable on 542mg daily.      Left-sided low back pain with sciatica    Exam not consistent with hip osteoarthritis - anticipate L lower bck pain with L sciatica. Provided with piriformis exercises. Discussed tylenol use PRN for discomfort. If home exercises not helpful, will recommend formal PT course. Pt agrees with plan.       Hypothyroidism    Chronic, stable continue current regimen.       Hypertension    Chronic, stable. Continue current regimen.       History of Helicobacter pylori infection   Fatty liver disease, nonalcoholic    Reviewed  with patient. LFTs stable.       Encounter for general adult medical examination with abnormal findings - Primary    Preventative protocols reviewed and updated unless pt declined. Discussed healthy diet and lifestyle.       Advanced care planning/counseling discussion    Advanced directive discussion -does not have available. Planning on seeing lawyer to work on this. Would want daughter Hassan Rowan to be HCPOA.          No  orders of the defined types were placed in this encounter.  Orders Placed This Encounter  Procedures  . DG Bone Density    Standing Status:   Future    Standing Expiration Date:   04/06/2019    Order Specific Question:   Reason for Exam (SYMPTOM  OR DIAGNOSIS REQUIRED)    Answer:   osteopenia f/u - try to schedule at same time as mammogram 07/2018    Order Specific Question:   Preferred imaging location?    Answer:   [facility]    Follow up plan: Return in about 4 months (around 06/04/2018).  Ria Bush, MD

## 2018-02-02 NOTE — Assessment & Plan Note (Signed)
Advanced directive discussion -does not have available. Planning on seeing lawyer to work on this. Would want daughter Hassan Rowan to be HCPOA.

## 2018-02-02 NOTE — Patient Instructions (Addendum)
We will check DEXA in June 2020 same day as mammogram - he ordenado densitometria para junio 2020 (tratar el mismo dia del [place]) If interested, check with pharmacy about new 2 shot shingles series (shingrix).  No use mupirocina para rosacea.  Tome tylenol para dolor de espalda y pierna. haga ejercicios para dolor de pierna, me parce que es un dolor sciatico. si no mejora, la refiriremos a terapia fisica.   Health Maintenance After Age 81 After age 47, you are at a higher risk for certain long-term diseases and infections as well as injuries from falls. Falls are a major cause of broken bones and head injuries in people who are older than age 16. Getting regular preventive care can help to keep you healthy and well. Preventive care includes getting regular testing and making lifestyle changes as recommended by your health care provider. Talk with your health care provider about:  Which screenings and tests you should have. A screening is a test that checks for a disease when you have no symptoms.  A diet and exercise plan that is right for you. What should I know about screenings and tests to prevent falls? Screening and testing are the best ways to find a health problem early. Early diagnosis and treatment give you the best chance of managing medical conditions that are common after age 75. Certain conditions and lifestyle choices may make you more likely to have a fall. Your health care provider may recommend:  Regular vision checks. Poor vision and conditions such as cataracts can make you more likely to have a fall. If you wear glasses, make sure to get your prescription updated if your vision changes.  Medicine review. Work with your health care provider to regularly review all of the medicines you are taking, including over-the-counter medicines. Ask your health care provider about any side effects that may make you more likely to have a fall. Tell your health care provider if any medicines  that you take make you feel dizzy or sleepy.  Osteoporosis screening. Osteoporosis is a condition that causes the bones to get weaker. This can make the bones weak and cause them to break more easily.  Blood pressure screening. Blood pressure changes and medicines to control blood pressure can make you feel dizzy.  Strength and balance checks. Your health care provider may recommend certain tests to check your strength and balance while standing, walking, or changing positions.  Foot health exam. Foot pain and numbness, as well as not wearing proper footwear, can make you more likely to have a fall.  Depression screening. You may be more likely to have a fall if you have a fear of falling, feel emotionally low, or feel unable to do activities that you used to do.  Alcohol use screening. Using too much alcohol can affect your balance and may make you more likely to have a fall. What actions can I take to lower my risk of falls? General instructions  Talk with your health care provider about your risks for falling. Tell your health care provider if: ? You fall. Be sure to tell your health care provider about all falls, even ones that seem minor. ? You feel dizzy, sleepy, or off-balance.  Take over-the-counter and prescription medicines only as told by your health care provider. These include any supplements.  Eat a healthy diet and maintain a healthy weight. A healthy diet includes low-fat dairy products, low-fat (lean) meats, and fiber from whole grains, beans, and lots of fruits  and vegetables. Home safety  Remove any tripping hazards, such as rugs, cords, and clutter.  Install safety equipment such as grab bars in bathrooms and safety rails on stairs.  Keep rooms and walkways well-lit. Activity   Follow a regular exercise program to stay fit. This will help you maintain your balance. Ask your health care provider what types of exercise are appropriate for you.  If you need a cane  or walker, use it as recommended by your health care provider.  Wear supportive shoes that have nonskid soles. Lifestyle  Do not drink alcohol if your health care provider tells you not to drink.  If you drink alcohol, limit how much you have: ? 0-1 drink a day for women. ? 0-2 drinks a day for men.  Be aware of how much alcohol is in your drink. In the [place] one drink equals one typical bottle of beer (12 oz), one-half glass of wine (5 oz), or one shot of hard liquor (1 oz).  Do not use any products that contain nicotine or tobacco, such as cigarettes and e-cigarettes. If you need help quitting, ask your health care provider. Summary  Having a healthy lifestyle and getting preventive care can help to protect your health and wellness after age 74.  Screening and testing are the best way to find a health problem early and help you avoid having a fall. Early diagnosis and treatment give you the best chance for managing medical conditions that are more common for people who are older than age 35.  Falls are a major cause of broken bones and head injuries in people who are older than age 49. Take precautions to prevent a fall at home.  Work with your health care provider to learn what changes you can make to improve your health and wellness and to prevent falls. This information is not intended to replace advice given to you by your health care provider. Make sure you discuss any questions you have with your health care provider. Document Released: 12/15/2016 Document Revised: 12/15/2016 Document Reviewed: 12/15/2016 Elsevier Interactive Patient Education  2019 [employer].

## 2018-02-02 NOTE — Assessment & Plan Note (Signed)
Chronic, stable continue current regimen.  

## 2018-02-02 NOTE — Assessment & Plan Note (Signed)
Chronic, stable. Continue current regimen. 

## 2018-02-02 NOTE — Assessment & Plan Note (Signed)
Preventative protocols reviewed and updated unless pt declined. Discussed healthy diet and lifestyle.  

## 2018-02-02 NOTE — Assessment & Plan Note (Signed)
Levels stable on 582mcg daily.

## 2018-02-02 NOTE — Assessment & Plan Note (Signed)
Deteriorated control noted - recommend stricter adherence to low sugar low carb diabetic diet.

## 2018-02-02 NOTE — Assessment & Plan Note (Signed)
Reviewed with patient. LFTs stable.

## 2018-02-02 NOTE — Assessment & Plan Note (Signed)
Reviewed with patient. Will repeat DEXA 07/2018 when she returns for next mammogram.

## 2018-02-07 ENCOUNTER — Other Ambulatory Visit: Payer: Self-pay

## 2018-02-07 MED ORDER — MONTELUKAST SODIUM 10 MG PO TABS: 10.0000 mg | ORAL_TABLET | Freq: Every day | ORAL | 0 refills | Status: DC

## 2018-02-07 MED ORDER — LEVOTHYROXINE SODIUM 112 MCG PO TABS: 112.0000 ug | ORAL_TABLET | Freq: Every day | ORAL | 0 refills | Status: DC

## 2018-02-07 NOTE — Telephone Encounter (Signed)
Pt has spoken with [facility] and [facility]ine and montelukast on 02/10/18. Refilled each # 15 to [facility]. Pt voiced understanding.

## 2018-02-09 ENCOUNTER — Other Ambulatory Visit: Payer: Self-pay | Admitting: Family Medicine

## 2018-03-16 ENCOUNTER — Telehealth: Payer: Self-pay

## 2018-03-16 MED ORDER — NAPROXEN 375 MG PO TABS: 375.0000 mg | ORAL_TABLET | Freq: Two times a day (BID) | ORAL | 0 refills | Status: DC

## 2018-03-16 NOTE — Telephone Encounter (Signed)
Patient walked in with husband, states dentist told her to come follow up with her primary due to back/leg pain level when she arrived for root canal procedure today. She could not sit through procedure and it had to be rescheduled.    Patient reports sudden onset this am with pain radiating from lower back down both legs to feet.  She denies any numbness this am but occasionally has hx of bilateral tingling in her feet, none at the present.    She had difficulty ambulating this am due to the pain.  Pain has been coming and going in waves this am with a rating of 10 at the worst. Current gait WNL with no issues with balance or mobility.    Pain level currently a 5 on a 0-10 scale.   Husband states she has hx of this intermittent pain and has been treated for sciatica in the past by another physician (unsure what she was given).   She denies any other symptoms, alert and oriented X 3 without any other neurologic symptoms.   Bowel/Bladder patterns are normal.    Discussed patient's current complaints with Dr Darnell Level.  he orders Naprosyn 500mg  twice daily with food (sent to [facility]) and set up for appointment tomorrow.    Note: she is currently on amoxicillin 500mg  1 pill 3x daily prior to procedure and for an abscess tooth for the next 4 days.      Patient/husband instructed as above and verbalize understanding; appointment made for 1200pm tomorrow 03/17/18 with Dr. Darnell Level.

## 2018-03-17 ENCOUNTER — Ambulatory Visit (INDEPENDENT_AMBULATORY_CARE_PROVIDER_SITE_OTHER): Payer: Medicare Other | Admitting: Family Medicine

## 2018-03-17 ENCOUNTER — Encounter: Payer: Self-pay | Admitting: Family Medicine

## 2018-03-17 VITALS — BP 120/80 | HR 58 | Temp 98.2°F | Ht 62.75 in | Wt 174.5 lb

## 2018-03-17 DIAGNOSIS — M5416 Radiculopathy, lumbar region: Secondary | ICD-10-CM | POA: Diagnosis not present

## 2018-03-17 NOTE — Assessment & Plan Note (Signed)
Exam today more consistent with DDD with bilateral radiculopathy but with benign exam (except for decreased achilles reflexes) ?lumbar stenosis. Symptoms seem to be improving with conservative management. Discussed treatment with cautious NSAID course, rest, stretching, provided exercises on lower back pain. If not improving, to let us know for stronger pain medication or PT course. Discussed red flags to seek further care, reviewed indications for further lumbar imaging with updated films and MRI

## 2018-03-17 NOTE — Patient Instructions (Addendum)
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 Siga curso de naprosen 375mg  dos veces al dia con comida.  Mire ejercicios de hoy.  Dejeme saber si no mejora con esto - para considerar curso de fisioterapia.

## 2018-03-17 NOTE — Progress Notes (Signed)
BP 120/80 (BP Location: Left Arm, Patient Position: Sitting, Cuff Size: Normal)   Pulse (!) 58   Temp 98.2 F (36.8 C) (Oral)   Ht 5' 2.75" (1.594 m)   Wt 174 lb 8 oz (79.2 kg)   SpO2 99%   BMI 31.16 kg/m    CC: back pain Subjective:    Patient ID: [name], female    DOB: [birth year], 84 y.o.   MRN: [id number]  HPI: [name] is a 84 y.o. female presenting on 03/17/2018 for Sciatica (C/o siatica nerve pain starting in posterior bilateral legs at the knees radiating up to lower back. Started yrs ago, but recent episoded started 2 days ago. Recently started naproxen. Pt accompanied by her husband, [name].)   See yesterday's note for details.   2d h/o lower back pain with radiation down posterior legs to knees bilaterally. No fever, bowel/bladder incontinence, leg numbness/weakness.   She had a fall down 1 stair 2 wks ago landed on left knee and arms.  Longstanding h/o chronic lower back pain with R sciatica previously managed with PRN aspirin. Noticing L leg weakness of a long duration as well - has to support L leg weight when going down stairs. Endorses bilateral leg weakness with prolonged standing or walking.      Relevant past medical, surgical, family and social history reviewed and updated as indicated. Interim medical history since our last visit reviewed. Allergies and medications reviewed and updated. Outpatient Medications Prior to Visit  Medication Sig Dispense Refill  . aspirin 81 MG tablet Take 81 mg by mouth every Monday, Wednesday, and Friday.     Marland Kitchen atenolol (TENORMIN) 50 MG tablet TAKE 1 TABLET BY MOUTH  DAILY 90 tablet 2  . Blood Glucose Monitoring Suppl (ONE TOUCH ULTRA SYSTEM KIT) W/DEVICE KIT 1 kit by Does not apply route once. 1 each 0  . cyanocobalamin (V-R VITAMIN B-12) 500 MCG tablet Take 1 tablet (500 mcg total) by mouth daily.    Marland Kitchen glucose blood (ONE TOUCH ULTRA TEST) test strip USE AS DIRECTED TO CHECK BLOOD SUGAR ONCE A DAY 100 each 0  .  Lancet Device MISC One touch delica - Use as directed 1 each 1  . Lancets Misc. (ACCU-CHEK SOFTCLIX LANCET DEV) KIT Use at home to test blood sugars daily. 1 kit 0  . levothyroxine (SYNTHROID, LEVOTHROID) 112 MCG tablet Take 1 tablet (112 mcg total) by mouth daily. 15 tablet 0  . losartan (COZAAR) 50 MG tablet TAKE 1 TABLET BY MOUTH  DAILY 90 tablet 2  . montelukast (SINGULAIR) 10 MG tablet Take 1 tablet (10 mg total) by mouth daily. 15 tablet 0  . mupirocin ointment (BACTROBAN) 2 % Place 1 application into the nose 2 (two) times daily. 22 g 0  . naproxen (NAPROSYN) 375 MG tablet Take 1 tablet (375 mg total) by mouth 2 (two) times daily with a meal. For 5 days then as needed 30 tablet 0  . ONETOUCH DELICA LANCETS FINE MISC Use 1-2 times daily as needed E11.42 100 each 3  . ondansetron (ZOFRAN) 4 MG tablet Take 1 tablet (4 mg total) by mouth every 8 (eight) hours as needed for nausea or vomiting. 20 tablet 0   No facility-administered medications prior to visit.      Per HPI unless specifically indicated in ROS section below Review of Systems Objective:    BP 120/80 (BP Location: Left Arm, Patient Position: Sitting, Cuff Size: Normal)   Pulse (!) 58  Temp 98.2 F (36.8 C) (Oral)   Ht 5' 2.75" (1.594 m)   Wt 174 lb 8 oz (79.2 kg)   SpO2 99%   BMI 31.16 kg/m   Wt Readings from Last 3 Encounters:  03/17/18 174 lb 8 oz (79.2 kg)  02/02/18 173 lb (78.5 kg)  01/31/18 173 lb 8 oz (78.7 kg)    Physical Exam Vitals signs and nursing note reviewed.  Constitutional:      General: She is not in acute distress.    Appearance: Normal appearance.  Musculoskeletal: Normal range of motion.     Comments: No pain midline spine No paraspinous mm tenderness Neg SLR bilaterally - she does have marked tightness at bilateral hamstrings with SLR. No pain with int/ext rotation at hip. No pain at SIJ, GTB or sciatic notch bilaterally.   Neurological:     General: No focal deficit present.     Mental  Status: She is alert.     Deep Tendon Reflexes:     Reflex Scores:      Bicep reflexes are 3+ on the right side and 3+ on the left side.      Patellar reflexes are 3+ on the right side and 3+ on the left side.      Achilles reflexes are 1+ on the right side and 1+ on the left side.    Comments: 5/5 strength BLE Sensation intact Brisk reflexes throughout, attenuated at achilles bilaterally      LUMBAR SPINE IMPRESSION:  1. No acute findings. 2. Mild scoliotic curvature of the lumbar spine, convex to the right, presumably degenerative in etiology.  3. Mild-to-moderate multilevel DDD throughout the lumbar spine, likely worse at L5-S1.  Electronically Signed By: Sandi Mariscal M.D. On: 07/03/2013 11:00  Assessment & Plan:   Problem List Items Addressed This Visit    Bilateral lumbar radiculopathy - Primary    Exam today more consistent with DDD with bilateral radiculopathy but with benign exam (except for decreased achilles reflexes) ?lumbar stenosis. Symptoms seem to be improving with conservative management. Discussed treatment with cautious NSAID course, rest, stretching, provided exercises on lower back pain. If not improving, to let us know for stronger pain medication or PT course. Discussed red flags to seek further care, reviewed indications for further lumbar imaging with updated films and MRI          No orders of the defined types were placed in this encounter.  No orders of the defined types were placed in this encounter.   Follow up plan: No follow-ups on file.  Ria Bush, MD

## 2018-03-23 ENCOUNTER — Other Ambulatory Visit: Payer: Self-pay | Admitting: Family Medicine

## 2018-03-27 DIAGNOSIS — H401133 Primary open-angle glaucoma, bilateral, severe stage: Secondary | ICD-10-CM | POA: Diagnosis not present

## 2018-04-02 NOTE — Progress Notes (Signed)
I reviewed health advisor's note, was available for consultation, and agree with documentation and plan.  

## 2018-04-05 DIAGNOSIS — J019 Acute sinusitis, unspecified: Secondary | ICD-10-CM | POA: Diagnosis not present

## 2018-04-24 ENCOUNTER — Other Ambulatory Visit: Payer: Self-pay | Admitting: Family Medicine

## 2018-05-04 ENCOUNTER — Other Ambulatory Visit: Payer: Medicare Other

## 2018-06-09 ENCOUNTER — Other Ambulatory Visit: Payer: Self-pay

## 2018-06-09 ENCOUNTER — Other Ambulatory Visit: Payer: Self-pay | Admitting: Family Medicine

## 2018-06-09 ENCOUNTER — Other Ambulatory Visit (INDEPENDENT_AMBULATORY_CARE_PROVIDER_SITE_OTHER): Payer: Medicare Other

## 2018-06-09 DIAGNOSIS — K760 Fatty (change of) liver, not elsewhere classified: Secondary | ICD-10-CM

## 2018-06-09 DIAGNOSIS — E1142 Type 2 diabetes mellitus with diabetic polyneuropathy: Secondary | ICD-10-CM

## 2018-06-09 DIAGNOSIS — E538 Deficiency of other specified B group vitamins: Secondary | ICD-10-CM

## 2018-06-09 DIAGNOSIS — I10 Essential (primary) hypertension: Secondary | ICD-10-CM

## 2018-06-09 DIAGNOSIS — E039 Hypothyroidism, unspecified: Secondary | ICD-10-CM

## 2018-06-09 DIAGNOSIS — R7989 Other specified abnormal findings of blood chemistry: Secondary | ICD-10-CM

## 2018-06-09 LAB — BASIC METABOLIC PANEL
BUN: 19 mg/dL (ref 6–23)
CO2: 31 mEq/L (ref 19–32)
Calcium: 9.4 mg/dL (ref 8.4–10.5)
Chloride: 103 mEq/L (ref 96–112)
Creatinine, Ser: 0.8 mg/dL (ref 0.40–1.20)
GFR: 68.78 mL/min (ref >60.00)
Glucose, Bld: 122 mg/dL — ABNORMAL HIGH (ref 70–99)
Potassium: 4.4 mEq/L (ref 3.5–5.1)
Sodium: 141 mEq/L (ref 135–145)

## 2018-06-09 LAB — HEMOGLOBIN A1C: Hgb A1c MFr Bld: 6.9 % — ABNORMAL HIGH (ref 4.6–6.5)

## 2018-06-12 ENCOUNTER — Other Ambulatory Visit: Payer: Self-pay

## 2018-06-12 ENCOUNTER — Ambulatory Visit (INDEPENDENT_AMBULATORY_CARE_PROVIDER_SITE_OTHER): Payer: Medicare Other | Admitting: Family Medicine

## 2018-06-12 ENCOUNTER — Encounter: Payer: Self-pay | Admitting: Family Medicine

## 2018-06-12 VITALS — BP 138/76 | HR 59 | Temp 97.6°F | Ht 62.75 in | Wt 172.5 lb

## 2018-06-12 DIAGNOSIS — J069 Acute upper respiratory infection, unspecified: Secondary | ICD-10-CM

## 2018-06-12 DIAGNOSIS — E1142 Type 2 diabetes mellitus with diabetic polyneuropathy: Secondary | ICD-10-CM

## 2018-06-12 DIAGNOSIS — R7989 Other specified abnormal findings of blood chemistry: Secondary | ICD-10-CM

## 2018-06-12 DIAGNOSIS — E538 Deficiency of other specified B group vitamins: Secondary | ICD-10-CM

## 2018-06-12 DIAGNOSIS — R1013 Epigastric pain: Secondary | ICD-10-CM

## 2018-06-12 MED ORDER — OMEPRAZOLE 40 MG PO CPDR: 40.0000 mg | DELAYED_RELEASE_CAPSULE | Freq: Every day | ORAL | 1 refills | Status: DC

## 2018-06-12 NOTE — Assessment & Plan Note (Signed)
Intermittent epigastric abd discomfort described as pressure - does improve with gas-X and ppi which was refilled.

## 2018-06-12 NOTE — Patient Instructions (Signed)
Regresar en 3 meses para proxima visita.  Diabetes sigue controlada, pero al borde.

## 2018-06-12 NOTE — Assessment & Plan Note (Signed)
Chronic, stable. A1c adequate. She will renew efforts at diabetic diet. Reassess at 77mo f/u visit. If needed pt would be interested in DPPT, has previously not tolerated metformin.

## 2018-06-12 NOTE — Assessment & Plan Note (Signed)
Recent URI during cruise 03/2018 reviewed with patient. Not consistent with [phone] symptoms owever a possibility. Consider Ig testing in the future.

## 2018-06-12 NOTE — Progress Notes (Signed)
BP 138/76 (BP Location: Left Arm, Patient Position: Sitting, Cuff Size: Normal)   Pulse (!) 59   Temp 97.6 F (36.4 C) (Oral)   Ht 5' 2.75" (1.594 m)   Wt 172 lb 8 oz (78.2 kg)   SpO2 98%   BMI 30.80 kg/m    CC: DM f/u visit Subjective:    Patient ID: [name], female    DOB: Jan 08, 1935, 84 y.o.   MRN: [id number]  HPI: [name] is a 84 y.o. female presenting on 06/12/2018 for Diabetes (Here for f/u.)   Recent [place] cruise ([place], [place]) 04/05/2018 - felt ill with ST, congestion, runny nose and earache. Evaluated by cruise ship doctor and brings records - WBC 11.1, Hgb 13.8, plt 290, Cr 0.7, CK 259. Flu and strep negative. Treated with biaxin, corcidin and robitussin. Wonders if she may have had coronavirus. Has been taking vit C with benefit.   Ongoing gassiness/bloating despite eating healthy. She feels omeprazole is beneficial for this. Asks about using gasX with PPI.   Will be due for DEXA 6/020.   Did not tolerate metformin in the past.  Ongoing intermittent lower extremity myalgias.   DM - does regularly check sugars fasting today 136, low 98, high 211. Compliant with antihyperglycemic regimen which includes: diet controlled. Denies low sugars or hypoglycemic symptoms. Denies paresthesias. Last diabetic eye exam 01/2018. Pneumovax: 2013. Prevnar: 2015. Glucometer brand: onetouch. DSME: remotely. Declines refresher course.  Lab Results  Component Value Date   HGBA1C 6.9 (H) 06/09/2018   Diabetic Foot Exam - Simple   Simple Foot Form Diabetic Foot exam was performed with the following findings:  Yes 06/12/2018 11:44 AM  Visual Inspection See comments:  Yes Sensation Testing See comments:  Yes Pulse Check Posterior Tibialis and Dorsalis pulse intact bilaterally:  Yes Comments 2+ DP bilaterally Diminished sensation to monofilament testing Corns bilateral soles    Lab Results  Component Value Date   MICROALBUR 2.2 (H) 08/13/2015        Relevant past  medical, surgical, family and social history reviewed and updated as indicated. Interim medical history since our last visit reviewed. Allergies and medications reviewed and updated. Outpatient Medications Prior to Visit  Medication Sig Dispense Refill  . aspirin 81 MG tablet Take 81 mg by mouth every Monday, Wednesday, and Friday.     Marland Kitchen atenolol (TENORMIN) 50 MG tablet TAKE 1 TABLET BY MOUTH  DAILY 90 tablet 3  . Blood Glucose Monitoring Suppl (ONE TOUCH ULTRA SYSTEM KIT) W/DEVICE KIT 1 kit by Does not apply route once. 1 each 0  . cyanocobalamin (V-R VITAMIN B-12) 500 MCG tablet Take 1 tablet (500 mcg total) by mouth daily.    Marland Kitchen glucose blood test strip TEST ONCE A DAY 100 each 3  . Lancet Device MISC One touch delica - Use as directed 1 each 1  . Lancets Misc. (ACCU-CHEK SOFTCLIX LANCET DEV) KIT Use at home to test blood sugars daily. 1 kit 0  . levothyroxine (SYNTHROID, LEVOTHROID) 112 MCG tablet TAKE 1 TABLET BY MOUTH  DAILY 90 tablet 1  . losartan (COZAAR) 50 MG tablet TAKE 1 TABLET BY MOUTH  DAILY 90 tablet 3  . montelukast (SINGULAIR) 10 MG tablet TAKE 1 TABLET BY MOUTH  DAILY 90 tablet 3  . ONETOUCH DELICA LANCETS FINE MISC Use 1-2 times daily as needed E11.42 100 each 3  . omeprazole (PRILOSEC) 40 MG capsule Take 40 mg by mouth daily.    . mupirocin ointment (  BACTROBAN) 2 % Place 1 application into the nose 2 (two) times daily. 22 g 0  . naproxen (NAPROSYN) 375 MG tablet Take 1 tablet (375 mg total) by mouth 2 (two) times daily with a meal. For 5 days then as needed 30 tablet 0   No facility-administered medications prior to visit.      Per HPI unless specifically indicated in ROS section below Review of Systems Objective:    BP 138/76 (BP Location: Left Arm, Patient Position: Sitting, Cuff Size: Normal)   Pulse (!) 59   Temp 97.6 F (36.4 C) (Oral)   Ht 5' 2.75" (1.594 m)   Wt 172 lb 8 oz (78.2 kg)   SpO2 98%   BMI 30.80 kg/m   Wt Readings from Last 3 Encounters:   06/12/18 172 lb 8 oz (78.2 kg)  03/17/18 174 lb 8 oz (79.2 kg)  02/02/18 173 lb (78.5 kg)    Physical Exam Vitals signs and nursing note reviewed.  Constitutional:      General: She is not in acute distress.    Appearance: She is well-developed.  HENT:     Head: Normocephalic and atraumatic.     Right Ear: External ear normal.     Left Ear: External ear normal.     Nose: Nose normal.     Mouth/Throat:     Mouth: Mucous membranes are moist.     Pharynx: No oropharyngeal exudate.  Eyes:     General: No scleral icterus.    Conjunctiva/sclera: Conjunctivae normal.     Pupils: Pupils are equal, round, and reactive to light.  Neck:     Musculoskeletal: Normal range of motion and neck supple.  Cardiovascular:     Rate and Rhythm: Normal rate and regular rhythm.     Heart sounds: Normal heart sounds. No murmur.  Pulmonary:     Effort: Pulmonary effort is normal. No respiratory distress.     Breath sounds: Normal breath sounds. No wheezing or rales.  Musculoskeletal:     Comments: See HPI for foot exam if done  Lymphadenopathy:     Cervical: No cervical adenopathy.  Skin:    General: Skin is warm and dry.     Findings: No rash.       Results for orders placed or performed in visit on 06/09/18  Hemoglobin A1c  Result Value Ref Range   Hgb A1c MFr Bld 6.9 (H) 4.6 - 6.5 %  Basic metabolic panel  Result Value Ref Range   Sodium 141 135 - 145 mEq/L   Potassium 4.4 3.5 - 5.1 mEq/L   Chloride 103 96 - 112 mEq/L   CO2 31 19 - 32 mEq/L   Glucose, Bld 122 (H) 70 - 99 mg/dL   BUN 19 6 - 23 mg/dL   Creatinine, Ser 0.80 0.40 - 1.20 mg/dL   Calcium 9.4 8.4 - 10.5 mg/dL   GFR 68.78 >60.00 mL/min   Lab Results  Component Value Date   VITAMINB12 564 01/31/2018    Assessment & Plan:   Problem List Items Addressed This Visit    URI (upper respiratory infection)    Recent URI during cruise 03/2018 reviewed with patient. Not consistent with [phone] symptoms owever a possibility.  Consider Ig testing in the future.       Type 2 diabetes, controlled, with peripheral neuropathy (HCC) - Primary    Chronic, stable. A1c adequate. She will renew efforts at diabetic diet. Reassess at 41mof/u visit. If needed pt would  be interested in DPPT, has previously not tolerated metformin.       Low vitamin B12 level    Pt feels vit b12 was not tolerated well. She has stopped this. Consider updated levels at next labwork.       Abdominal discomfort, epigastric    Intermittent epigastric abd discomfort described as pressure - does improve with gas-X and ppi which was refilled.           Meds ordered this encounter  Medications  . omeprazole (PRILOSEC) 40 MG capsule    Sig: Take 1 capsule (40 mg total) by mouth daily.    Dispense:  90 capsule    Refill:  1   No orders of the defined types were placed in this encounter.   Follow up plan: Return in about 3 months (around 09/11/2018) for follow up visit.  Ria Bush, MD

## 2018-06-12 NOTE — Assessment & Plan Note (Signed)
Pt feels vit b12 was not tolerated well. She has stopped this. Consider updated levels at next labwork.

## 2018-06-13 ENCOUNTER — Other Ambulatory Visit: Payer: Self-pay

## 2018-06-14 ENCOUNTER — Telehealth: Payer: Self-pay

## 2018-06-14 MED ORDER — ONETOUCH DELICA LANCETS 33G MISC: 1 refills | Status: DC

## 2018-06-14 NOTE — Telephone Encounter (Signed)
pts husband left v/m that pharmacy had been trying get refill for one touch lancets since 06/12/18. I did not see request in pts chart. I called [facility] and they were faxing refill request to Dr Danise Mina at [phone]. That is not Dr Gutierrez's fax #. I gave [facility] in [place] correct fax # and advised they did not have to request again; I would send in electronically to [facility]. Pt last seen 06/12/18.i called and spoke with pt and apologized but explained we were not getting the request for refill of lancets. I advised pt I sent approval for refill of lancets to [facility]. Pt voiced understanding.

## 2018-08-01 ENCOUNTER — Other Ambulatory Visit: Payer: Self-pay | Admitting: Family Medicine

## 2018-08-01 DIAGNOSIS — Z1231 Encounter for screening mammogram for malignant neoplasm of breast: Secondary | ICD-10-CM

## 2018-08-30 ENCOUNTER — Other Ambulatory Visit: Payer: Self-pay | Admitting: Family Medicine

## 2018-08-30 DIAGNOSIS — E538 Deficiency of other specified B group vitamins: Secondary | ICD-10-CM

## 2018-08-30 DIAGNOSIS — E1142 Type 2 diabetes mellitus with diabetic polyneuropathy: Secondary | ICD-10-CM

## 2018-09-01 ENCOUNTER — Other Ambulatory Visit (INDEPENDENT_AMBULATORY_CARE_PROVIDER_SITE_OTHER): Payer: Medicare Other

## 2018-09-01 DIAGNOSIS — E538 Deficiency of other specified B group vitamins: Secondary | ICD-10-CM

## 2018-09-01 DIAGNOSIS — E1142 Type 2 diabetes mellitus with diabetic polyneuropathy: Secondary | ICD-10-CM

## 2018-09-01 LAB — BASIC METABOLIC PANEL
BUN: 20 mg/dL (ref 6–23)
CO2: 30 mEq/L (ref 19–32)
Calcium: 9.3 mg/dL (ref 8.4–10.5)
Chloride: 102 mEq/L (ref 96–112)
Creatinine, Ser: 0.92 mg/dL (ref 0.40–1.20)
GFR: 58.5 mL/min — ABNORMAL LOW (ref >60.00)
Glucose, Bld: 107 mg/dL — ABNORMAL HIGH (ref 70–99)
Potassium: 4.2 mEq/L (ref 3.5–5.1)
Sodium: 138 mEq/L (ref 135–145)

## 2018-09-01 LAB — VITAMIN B12: Vitamin B-12: 246 pg/mL (ref 211–911)

## 2018-09-01 LAB — HEMOGLOBIN A1C: Hgb A1c MFr Bld: 6.7 % — ABNORMAL HIGH (ref 4.6–6.5)

## 2018-09-05 ENCOUNTER — Ambulatory Visit (INDEPENDENT_AMBULATORY_CARE_PROVIDER_SITE_OTHER): Payer: Medicare Other | Admitting: Family Medicine

## 2018-09-05 ENCOUNTER — Ambulatory Visit (INDEPENDENT_AMBULATORY_CARE_PROVIDER_SITE_OTHER)
Admission: RE | Admit: 2018-09-05 | Discharge: 2018-09-05 | Disposition: A | Discharge status: Home / Self Care | Payer: Medicare Other | Source: Ambulatory Visit | Attending: Family Medicine | Admitting: Family Medicine

## 2018-09-05 ENCOUNTER — Encounter: Payer: Self-pay | Admitting: Family Medicine

## 2018-09-05 ENCOUNTER — Other Ambulatory Visit: Payer: Self-pay

## 2018-09-05 VITALS — BP 135/78 | HR 61 | Temp 98.0°F | Ht 63.0 in | Wt 170.0 lb

## 2018-09-05 DIAGNOSIS — M5416 Radiculopathy, lumbar region: Secondary | ICD-10-CM

## 2018-09-05 DIAGNOSIS — E1142 Type 2 diabetes mellitus with diabetic polyneuropathy: Secondary | ICD-10-CM

## 2018-09-05 DIAGNOSIS — M545 Low back pain: Secondary | ICD-10-CM | POA: Diagnosis not present

## 2018-09-05 DIAGNOSIS — M8580 Other specified disorders of bone density and structure, unspecified site: Secondary | ICD-10-CM | POA: Diagnosis not present

## 2018-09-05 DIAGNOSIS — R7989 Other specified abnormal findings of blood chemistry: Secondary | ICD-10-CM

## 2018-09-05 DIAGNOSIS — E538 Deficiency of other specified B group vitamins: Secondary | ICD-10-CM

## 2018-09-05 NOTE — Progress Notes (Signed)
This visit was conducted in person.  BP 135/78 (BP Location: Left Arm, Patient Position: Sitting, Cuff Size: Normal)    Pulse 61    Temp 98 F (36.7 C) (Temporal)    Ht 5' 3"  (1.6 m)    Wt 170 lb (77.1 kg)    SpO2 96%    BMI 30.11 kg/m    CC: 3 mo f/u visit Subjective:    Patient ID: [name], female    DOB: 06-09-1934, 84 y.o.   MRN: [id number]  HPI: [name] is a 84 y.o. female presenting on 09/05/2018 for Follow-up (no new concerns)   Vit B12 deficiency - has not been taking regularly.   Recurrent R sciatica from lower back with radiation down posterior R leg. Acutely worse over the past month. Points to groin as worse pain as well. More noticeable going up and down stairs. Nothing improves pain - has tried bayer aspirin for arthritis with some benefit. Pain when supine. However ,even when in pain, able to ride stationary bike without pain.   DM - does regularly check sugars 90-110s. Compliant with antihyperglycemic regimen which includes: diet control. Watching portion sizes, limiting ice cream. Has previously not tolerated metformin. Denies low sugars or hypoglycemic symptoms. Denies paresthesias. Last diabetic eye exam 01/2018. Pneumovax: 2013. Prevnar: 2015. Glucometer brand: onetouch. DSME: completed remotely, declines refresher course. Lab Results  Component Value Date   HGBA1C 6.7 (H) 09/01/2018   Diabetic Foot Exam - Simple   No data filed     Lab Results  Component Value Date   MICROALBUR 2.2 (H) 08/13/2015     Due for DEXA 07/2018     Relevant past medical, surgical, family and social history reviewed and updated as indicated. Interim medical history since our last visit reviewed. Allergies and medications reviewed and updated. Outpatient Medications Prior to Visit  Medication Sig Dispense Refill   aspirin 81 MG tablet Take 81 mg by mouth every Monday, Wednesday, and Friday.      atenolol (TENORMIN) 50 MG tablet TAKE 1 TABLET BY MOUTH  DAILY 90  tablet 3   Blood Glucose Monitoring Suppl (ONE TOUCH ULTRA SYSTEM KIT) W/DEVICE KIT 1 kit by Does not apply route once. 1 each 0   cyanocobalamin (V-R VITAMIN B-12) 500 MCG tablet Take 1 tablet (500 mcg total) by mouth daily.     glucose blood test strip TEST ONCE A DAY 100 each 3   Lancet Device MISC One touch delica - Use as directed 1 each 1   Lancets Misc. (ACCU-CHEK SOFTCLIX LANCET DEV) KIT Use at home to test blood sugars daily. 1 kit 0   levothyroxine (SYNTHROID, LEVOTHROID) 112 MCG tablet TAKE 1 TABLET BY MOUTH  DAILY 90 tablet 1   losartan (COZAAR) 50 MG tablet TAKE 1 TABLET BY MOUTH  DAILY 90 tablet 3   montelukast (SINGULAIR) 10 MG tablet TAKE 1 TABLET BY MOUTH  DAILY 90 tablet 3   omeprazole (PRILOSEC) 40 MG capsule Take 1 capsule (40 mg total) by mouth daily. 90 capsule 1   OneTouch Delica Lancets 54Y MISC Check blood sugar 1 - 2 times daily as needed. E11.42 100 each 1   No facility-administered medications prior to visit.      Per HPI unless specifically indicated in ROS section below Review of Systems Objective:    BP 135/78 (BP Location: Left Arm, Patient Position: Sitting, Cuff Size: Normal)    Pulse 61    Temp 98 F (36.7 C) (  Temporal)    Ht 5' 3"  (1.6 m)    Wt 170 lb (77.1 kg)    SpO2 96%    BMI 30.11 kg/m   Wt Readings from Last 3 Encounters:  09/05/18 170 lb (77.1 kg)  06/12/18 172 lb 8 oz (78.2 kg)  03/17/18 174 lb 8 oz (79.2 kg)    Physical Exam Vitals signs and nursing note reviewed.  Constitutional:      General: She is not in acute distress.    Appearance: Normal appearance. She is well-developed. She is not ill-appearing.  HENT:     Head: Normocephalic and atraumatic.     Mouth/Throat:     Mouth: Mucous membranes are moist.     Pharynx: No oropharyngeal exudate or posterior oropharyngeal erythema.  Eyes:     General: No scleral icterus.    Conjunctiva/sclera: Conjunctivae normal.     Pupils: Pupils are equal, round, and reactive to light.    Neck:     Musculoskeletal: Normal range of motion and neck supple.  Cardiovascular:     Rate and Rhythm: Normal rate and regular rhythm.     Pulses: Normal pulses.     Heart sounds: Normal heart sounds. No murmur.  Pulmonary:     Effort: Pulmonary effort is normal. No respiratory distress.     Breath sounds: Normal breath sounds. No wheezing, rhonchi or rales.  Musculoskeletal: Normal range of motion.     Comments:  See HPI for foot exam if done No pain midline spine No paraspinous mm tenderness Neg SLR bilaterally. No pain with int/ext rotation at hip. Neg FABER. No pain at SIJ, GTB bilaterally. Mild discomfort at R sciatic notch   Lymphadenopathy:     Cervical: No cervical adenopathy.  Skin:    General: Skin is warm and dry.     Findings: No rash.  Neurological:     General: No focal deficit present.     Mental Status: She is alert.     Comments: 5/5 strength BLE       Results for orders placed or performed in visit on 09/01/18  Vitamin B12  Result Value Ref Range   Vitamin B-12 246 211 - 911 pg/mL  Hemoglobin A1c  Result Value Ref Range   Hgb A1c MFr Bld 6.7 (H) 4.6 - 6.5 %  Basic metabolic panel  Result Value Ref Range   Sodium 138 135 - 145 mEq/L   Potassium 4.2 3.5 - 5.1 mEq/L   Chloride 102 96 - 112 mEq/L   CO2 30 19 - 32 mEq/L   Glucose, Bld 107 (H) 70 - 99 mg/dL   BUN 20 6 - 23 mg/dL   Creatinine, Ser 0.92 0.40 - 1.20 mg/dL   Calcium 9.3 8.4 - 10.5 mg/dL   GFR 58.50 (L) >60.00 mL/min   Assessment & Plan:   Problem List Items Addressed This Visit    Type 2 diabetes, controlled, with peripheral neuropathy (HCC)    Chronic, stable. Pt motivated to continue managing diabetes with healthy diet choices.       Osteopenia    Due for DEXA, I have already ordered - advised she call [facility] to see if she can get done when she goes for mammogram on Friday.       Lumbar back pain with radiculopathy affecting right lower extremity - Primary    Exam today not  consistent with hip pathology but rather R sided sciatica. Limited in treatment options (NSAIDs worsen GERD, prednisone could worsen diabetes).  rec tylenol 557m TID with meals, discussed stretching exercises but forgot to provide so will mail. Will update lumbar films (known h/o lumbar DDD). She declines PT referral at this time. Update if not improving to consider prednisone course.       Relevant Orders   DG Lumbar Spine Complete   Low vitamin B12 level    B12 level remains low normal. She has not been regular with her replacement - advised daily b12 tablet.           No orders of the defined types were placed in this encounter.  Orders Placed This Encounter  Procedures   DG Lumbar Spine Complete    Standing Status:   Future    Number of Occurrences:   1    Standing Expiration Date:   11/06/2019    Order Specific Question:   Reason for Exam (SYMPTOM  OR DIAGNOSIS REQUIRED)    Answer:   chronic lower back pain with R sided sciatica    Order Specific Question:   [facility]    [facility]   Order Specific Question:   Radiology Contrast Protocol - do NOT remove file path    Answer:   [email or web address]    Patient Instructions  Xray of lumbar spine  Felicidades con mejoria de diabetes.   Creo que dolor de espalda y pierna viene de sciatica. Haga ejercicios para esto. Puede usar tylenol 5067m3 veces al dia. Camine cuanto pueda. Si no esta mejorando, dejeme saber para curso de prednisona.  Tome vitamina B12 diaria porque niveles siguen bajos.  Regresar en 5 meses para examen fisico.   Follow up plan: Return in about 5 months (around 02/05/2019) for annual exam, prior fasting for blood work, medicare wellness visit.  JaRia BushMD

## 2018-09-05 NOTE — Assessment & Plan Note (Signed)
Exam today not consistent with hip pathology but rather R sided sciatica. Limited in treatment options (NSAIDs worsen GERD, prednisone could worsen diabetes). rec tylenol 500mg  TID with meals, discussed stretching exercises but forgot to provide so will mail. Will update lumbar films (known h/o lumbar DDD). She declines PT referral at this time. Update if not improving to consider prednisone course.

## 2018-09-05 NOTE — Patient Instructions (Addendum)
Xray of lumbar spine  Felicidades con mejoria de diabetes.   Creo que dolor de espalda y pierna viene de sciatica. Haga ejercicios para esto. Puede usar tylenol 500mg  3 veces al dia. Camine cuanto pueda. Si no esta mejorando, dejeme saber para curso de prednisona.  Tome vitamina B12 diaria porque niveles siguen bajos.  Regresar en 5 meses para examen fisico.

## 2018-09-05 NOTE — Assessment & Plan Note (Signed)
B12 level remains low normal. She has not been regular with her replacement - advised daily b12 tablet.

## 2018-09-05 NOTE — Assessment & Plan Note (Addendum)
Chronic, stable. Pt motivated to continue managing diabetes with healthy diet choices.

## 2018-09-05 NOTE — Assessment & Plan Note (Signed)
Due for DEXA, I have already ordered - advised she call [facility] to see if she can get done when she goes for mammogram on Friday.

## 2018-09-08 ENCOUNTER — Ambulatory Visit
Admission: RE | Admit: 2018-09-08 | Discharge: 2018-09-08 | Disposition: A | Discharge status: Home / Self Care | Payer: Medicare Other | Source: Ambulatory Visit | Attending: Family Medicine | Admitting: Family Medicine

## 2018-09-08 DIAGNOSIS — Z1231 Encounter for screening mammogram for malignant neoplasm of breast: Secondary | ICD-10-CM | POA: Diagnosis not present

## 2018-09-08 LAB — HM MAMMOGRAPHY

## 2018-09-11 ENCOUNTER — Encounter: Payer: Self-pay | Admitting: Family Medicine

## 2018-09-15 ENCOUNTER — Other Ambulatory Visit: Payer: Self-pay | Admitting: Family Medicine

## 2018-09-25 DIAGNOSIS — H401133 Primary open-angle glaucoma, bilateral, severe stage: Secondary | ICD-10-CM | POA: Diagnosis not present

## 2018-10-05 ENCOUNTER — Telehealth: Payer: Self-pay | Admitting: Family Medicine

## 2018-10-05 NOTE — Telephone Encounter (Signed)
Patient saw Dr.G about 3 weeks ago for sciatica.  Patient was told to take Tylenol 2 x a day. Dr. Darnell Level told patient to call back, if the Tylenol didn't help.  Patient said the Tylenol hasn't helped at all.  [facility] uses [facility]

## 2018-10-06 MED ORDER — PREDNISONE 20 MG PO TABS: ORAL_TABLET | ORAL | 0 refills | Status: DC

## 2018-10-06 NOTE — Telephone Encounter (Signed)
Spoke with pt asking about other sxs.  Denies any numbness or loss of bowel/bladder.  However, she is having some weakness in right leg.  I relayed Dr. Synthia Innocent message and pt wants to try prednisone taper.

## 2018-10-06 NOTE — Addendum Note (Signed)
Addended by: Ria Bush on: 10/06/2018 05:13 PM   Modules accepted: Orders

## 2018-10-06 NOTE — Telephone Encounter (Signed)
Prednisone course sent to pharmacy. rec OV if no improvement with this.

## 2018-10-06 NOTE — Telephone Encounter (Signed)
Ensure no numbness in groin, weakness of legs, loss of bowel/bladder. If these symptoms, would suggest lumbar MRI.  Otherwise options would be either formal PT course or prednisone taper (caution with hyperglycemia). Let me know which route she'd prefer to go.

## 2018-10-09 NOTE — Telephone Encounter (Signed)
Spoke with patient. She took her first dose of Prednisone yesterday 1 in the morning and 1 in the afternoon. This morning she woke up and her cheeks were and felt hot, redness was present. No SOB, no choking sensation. Patient states this has resolved now. Lasted a few hours. No itching. No vomiting or nausea. Patient has taking Prednisone dose this morning not tonight yet. Patient does not recall if she has taking steroids in the past. Advised patient I would check with PCP and make sure this is not from the medication. Patient felt fine when I spoke to her.

## 2018-10-09 NOTE — Telephone Encounter (Signed)
Left message to call back  

## 2018-10-09 NOTE — Telephone Encounter (Addendum)
This is definitely a flushing side effect from prednisone use.  Would decrease prednisone to 1 a day, and if she continues to have this side effect rec stop prednisone altogether and let us know.

## 2018-10-10 NOTE — Telephone Encounter (Signed)
Spoke with pt relaying Dr. G's instructions.  Pt verbalizes understanding.  

## 2018-10-19 ENCOUNTER — Ambulatory Visit (INDEPENDENT_AMBULATORY_CARE_PROVIDER_SITE_OTHER): Payer: Medicare Other

## 2018-10-19 ENCOUNTER — Telehealth: Payer: Self-pay | Admitting: Family Medicine

## 2018-10-19 ENCOUNTER — Other Ambulatory Visit: Payer: Self-pay

## 2018-10-19 DIAGNOSIS — Z23 Encounter for immunization: Secondary | ICD-10-CM

## 2018-10-19 DIAGNOSIS — M5416 Radiculopathy, lumbar region: Secondary | ICD-10-CM

## 2018-10-19 NOTE — Telephone Encounter (Signed)
Late entry- seen at husband's appt today with ongoing activity limiting lower back pain with R radiculopathy x 2+ months. Failed conservative management with scheduled tylenol, home rehab exercises.  Lumbar films abnormal.  Will proceed with MRI for further evaluation.

## 2018-10-20 NOTE — Telephone Encounter (Signed)
MRI scheduled and patient aware.

## 2018-11-02 ENCOUNTER — Ambulatory Visit
Admission: RE | Admit: 2018-11-02 | Discharge: 2018-11-02 | Disposition: A | Discharge status: Home / Self Care | Payer: Medicare Other | Source: Ambulatory Visit | Attending: Family Medicine | Admitting: Family Medicine

## 2018-11-02 ENCOUNTER — Other Ambulatory Visit: Payer: Self-pay

## 2018-11-02 DIAGNOSIS — M545 Low back pain: Secondary | ICD-10-CM | POA: Diagnosis not present

## 2018-11-02 DIAGNOSIS — M5416 Radiculopathy, lumbar region: Secondary | ICD-10-CM | POA: Insufficient documentation

## 2018-12-27 ENCOUNTER — Other Ambulatory Visit: Payer: Self-pay

## 2018-12-27 MED ORDER — ONETOUCH DELICA LANCETS 33G MISC: 1 refills | Status: DC

## 2018-12-27 NOTE — Telephone Encounter (Signed)
E-scribed refills.  

## 2019-01-09 ENCOUNTER — Ambulatory Visit: Payer: Medicare Other | Admitting: Podiatry

## 2019-01-09 ENCOUNTER — Encounter: Payer: Self-pay | Admitting: Podiatry

## 2019-01-09 ENCOUNTER — Other Ambulatory Visit: Payer: Self-pay

## 2019-01-09 DIAGNOSIS — M79674 Pain in right toe(s): Secondary | ICD-10-CM

## 2019-01-09 DIAGNOSIS — Q828 Other specified congenital malformations of skin: Secondary | ICD-10-CM | POA: Diagnosis not present

## 2019-01-09 DIAGNOSIS — M79675 Pain in left toe(s): Secondary | ICD-10-CM

## 2019-01-09 DIAGNOSIS — L84 Corns and callosities: Secondary | ICD-10-CM

## 2019-01-09 DIAGNOSIS — M79671 Pain in right foot: Secondary | ICD-10-CM

## 2019-01-09 DIAGNOSIS — B351 Tinea unguium: Secondary | ICD-10-CM

## 2019-01-09 DIAGNOSIS — M79672 Pain in left foot: Secondary | ICD-10-CM

## 2019-01-12 ENCOUNTER — Encounter: Payer: Self-pay | Admitting: Podiatry

## 2019-01-12 NOTE — Progress Notes (Signed)
  Subjective:  Patient ID: [name], female    DOB: [birth year],  MRN: [id number]  Chief Complaint  Patient presents with  . Callouses    Patient presents today for painful callous lesions bottom of bilat feet x years   She states "it feels like Im walking on a rock"   84 y.o. female returns for the above complaint.  Patient presents with painful bilateral submet 2 hyperkeratotic lesions.  Patient states is worse when ambulating.  Patient denies any acute treatment for them.  She states that she has been trying to debride them down with a puma stone.  Which has not helped.  She states that she has tried different shoe gear modification which has not helped.  It has been progressively getting worse.  She has tried lotion which has not helped either.  She denies any other treatment modalities or seeing any other podiatrist.  She also has a secondary complaint of elongated thickened discolored toenails x10.  Objective:  There were no vitals filed for this visit. Podiatric Exam: Vascular: dorsalis pedis and posterior tibial pulses are palpable bilateral. Capillary return is immediate. Temperature gradient is WNL. Skin turgor WNL  Sensorium: Normal Semmes Weinstein monofilament test. Normal tactile sensation bilaterally. Nail Exam: Pt has thick disfigured discolored nails with subungual debris noted bilateral entire nail hallux through fifth toenails Ulcer Exam: There is no evidence of ulcer or pre-ulcerative changes or infection. Orthopedic Exam: Muscle tone and strength are WNL. No limitations in general ROM. No crepitus or effusions noted. HAV  B/L.  Hammer toes 2-5  B/L. Skin: No Porokeratosis. No infection or ulcers  Assessment & Plan:  Patient was evaluated and treated and all questions answered.  Porokeratosis/hyperkeratotic lesion with destruction of the lesion -Using a chisel blade and a handle, the lesions were aggressively debrided down to healthy striated tissue.  No pinpoint  bleeding.  No complication noted.  I then applied Cantharone therapy to help decrease the recurrence of porokeratosis by destroying the lesion.  Onychomycosis with pain  -Nails palliatively debrided as below. -Educated on self-care  Procedure: Nail Debridement Rationale: pain  Type of Debridement: manual, sharp debridement. Instrumentation: Nail nipper, rotary burr. Number of Nails: 10  Procedures and Treatment: Consent by patient was obtained for treatment procedures. The patient understood the discussion of treatment and procedures well. All questions were answered thoroughly reviewed. Debridement of mycotic and hypertrophic toenails, 1 through 5 bilateral and clearing of subungual debris. No ulceration, no infection noted.  Return Visit-Office Procedure: Patient instructed to return to the office for a follow up visit 3 months for continued evaluation and treatment.  Boneta Lucks, DPM    No follow-ups on file.

## 2019-01-17 ENCOUNTER — Telehealth: Payer: Self-pay | Admitting: *Deleted

## 2019-01-17 NOTE — Telephone Encounter (Signed)
April Gilery NP with [employer] left a voicemail stating that they performed a circulation test on lower extremities which showed some deficiency in that which could be a problem. April stated that patient is not having any symptoms. April stated that if you have any questions to give her a call.

## 2019-01-17 NOTE — Telephone Encounter (Signed)
Will await report. 

## 2019-01-29 DIAGNOSIS — H401133 Primary open-angle glaucoma, bilateral, severe stage: Secondary | ICD-10-CM | POA: Diagnosis not present

## 2019-02-01 ENCOUNTER — Other Ambulatory Visit: Payer: Self-pay | Admitting: Family Medicine

## 2019-02-01 DIAGNOSIS — E1142 Type 2 diabetes mellitus with diabetic polyneuropathy: Secondary | ICD-10-CM

## 2019-02-01 DIAGNOSIS — E538 Deficiency of other specified B group vitamins: Secondary | ICD-10-CM

## 2019-02-01 DIAGNOSIS — K760 Fatty (change of) liver, not elsewhere classified: Secondary | ICD-10-CM

## 2019-02-01 DIAGNOSIS — E039 Hypothyroidism, unspecified: Secondary | ICD-10-CM

## 2019-02-02 ENCOUNTER — Other Ambulatory Visit (INDEPENDENT_AMBULATORY_CARE_PROVIDER_SITE_OTHER): Payer: Medicare Other

## 2019-02-02 ENCOUNTER — Other Ambulatory Visit: Payer: Medicare Other

## 2019-02-02 ENCOUNTER — Other Ambulatory Visit: Payer: Self-pay

## 2019-02-02 ENCOUNTER — Ambulatory Visit: Payer: Medicare Other

## 2019-02-02 ENCOUNTER — Ambulatory Visit (INDEPENDENT_AMBULATORY_CARE_PROVIDER_SITE_OTHER): Payer: Medicare Other

## 2019-02-02 DIAGNOSIS — E039 Hypothyroidism, unspecified: Secondary | ICD-10-CM

## 2019-02-02 DIAGNOSIS — K760 Fatty (change of) liver, not elsewhere classified: Secondary | ICD-10-CM | POA: Diagnosis not present

## 2019-02-02 DIAGNOSIS — E1142 Type 2 diabetes mellitus with diabetic polyneuropathy: Secondary | ICD-10-CM | POA: Diagnosis not present

## 2019-02-02 DIAGNOSIS — Z0000 Encounter for general adult medical examination without abnormal findings: Secondary | ICD-10-CM

## 2019-02-02 DIAGNOSIS — E538 Deficiency of other specified B group vitamins: Secondary | ICD-10-CM | POA: Diagnosis not present

## 2019-02-02 LAB — LIPID PANEL
Cholesterol: 122 mg/dL (ref 0–200)
HDL: 45.2 mg/dL (ref >39.00)
LDL Cholesterol: 59 mg/dL (ref 0–99)
NonHDL: 76.67
Total CHOL/HDL Ratio: 3
Triglycerides: 90 mg/dL (ref 0.0–149.0)
VLDL: 18 mg/dL (ref 0.0–40.0)

## 2019-02-02 LAB — COMPREHENSIVE METABOLIC PANEL
ALT: 29 U/L (ref 0–35)
AST: 18 U/L (ref 0–37)
Albumin: 4.3 g/dL (ref 3.5–5.2)
Alkaline Phosphatase: 72 U/L (ref 39–117)
BUN: 19 mg/dL (ref 6–23)
CO2: 29 mEq/L (ref 19–32)
Calcium: 9.4 mg/dL (ref 8.4–10.5)
Chloride: 103 mEq/L (ref 96–112)
Creatinine, Ser: 0.79 mg/dL (ref 0.40–1.20)
GFR: 69.67 mL/min (ref >60.00)
Glucose, Bld: 95 mg/dL (ref 70–99)
Potassium: 4.5 mEq/L (ref 3.5–5.1)
Sodium: 138 mEq/L (ref 135–145)
Total Bilirubin: 0.6 mg/dL (ref 0.2–1.2)
Total Protein: 6.9 g/dL (ref 6.0–8.3)

## 2019-02-02 LAB — HEMOGLOBIN A1C: Hgb A1c MFr Bld: 6.5 % (ref 4.6–6.5)

## 2019-02-02 LAB — VITAMIN B12: Vitamin B-12: 480 pg/mL (ref 211–911)

## 2019-02-02 LAB — TSH: TSH: 1.34 u[IU]/mL (ref 0.35–4.50)

## 2019-02-02 NOTE — Progress Notes (Signed)
PCP notes:  Health Maintenance: No Gaps noted   Abnormal Screenings: none   Patient concerns: Patient is having chronic pain in her right leg    Nurse concerns: none   Next PCP appt.: 02/14/2019 @ 10:30 am

## 2019-02-02 NOTE — Progress Notes (Addendum)
Subjective:   [name] is a 84 y.o. female who presents for Medicare Annual (Subsequent) preventive examination.  Review of Systems: N/A   This visit is being conducted through telemedicine via telephone at the nurse health advisor's home address due to the COVID-19 pandemic. This patient has given me verbal consent via doximity to conduct this visit, patient states they are participating from their home address. Patient and myself are on the telephone call. There is no referral for this visit. Some vital signs may be absent or patient reported.    Patient identification: identified by name, DOB, and current address   Cardiac Risk Factors include: advanced age (>71mn, >>34women);diabetes mellitus;hypertension     Objective:     Vitals: There were no vitals taken for this visit.  There is no height or weight on file to calculate BMI.  Advanced Directives 02/02/2019 01/27/2018 07/22/2016 12/19/2015 12/13/2014  Does Patient Have a Medical Advance Directive? No No No No No  Would patient like information on creating a medical advance directive? Yes (MAU/Ambulatory/Procedural Areas - Information given) No - Patient declined No - Patient declined No - patient declined information Yes - [employer]ist, clinical (histocompatibility and immunogenetics)given    Tobacco Social History   Tobacco Use  Smoking Status Never Smoker  Smokeless Tobacco Never Used     Counseling given: Not Answered   Clinical Intake:  Pre-visit preparation completed: Yes  Pain : No/denies pain     Nutritional Risks: None Diabetes: Yes CBG done?: No Did pt. bring in CBG monitor from home?: No  How often do you need to have someone help you when you read instructions, pamphlets, or other written materials from your doctor or pharmacy?: 1 - Never What is the last grade level you completed in school?: 12th  Interpreter Needed?: No  Information entered by :: CJohnson, LPN  Past Medical History:  Diagnosis Date  . Allergy    hay fever    . Arthritis   . Cataract    resolved with surgery  . Chicken pox   . Colon polyp   . Diabetes mellitus 2008  . Generalized headaches    thinks caused by glaucoma  . Glaucoma   . Hypertension   . Right sided sciatica 07/03/2013  . Thyroid disease    hypothyroidism  . Urinary incontinence   . UTI (urinary tract infection)    Past Surgical History:  Procedure Laterality Date  . APPENDECTOMY  1998  . BUNIONECTOMY    . CHOLECYSTECTOMY    . COLONOSCOPY  2007   int hem, poor bowel prep, normal barium enema in following ([place])  . COLONOSCOPY WITH PROPOFOL N/A 07/22/2016   diverticulosis (Allen Norris Darren, MD)  . EYE SURGERY     R  eye-lid drop surgery  . TONSILLECTOMY     Family History  Problem Relation Age of Onset  . Heart disease Mother   . Asthma Mother   . Heart attack Mother 69      aneurysm ruptured by heart  . Heart disease Father   . Deep vein thrombosis Father   . Leukemia Brother   . Cancer Brother   . Diabetes Sister   . Crohn's disease Brother   . Diabetes Brother   . Arthritis Other   . Diabetes Other   . Cancer Maternal Aunt        ovarian?  . Cancer Maternal Grandfather        colon  . Breast cancer Neg Hx  Social History   Socioeconomic History  . Marital status: Married    Spouse name: Not on file  . Number of children: Not on file  . Years of education: Not on file  . Highest education level: Not on file  Occupational History  . Not on file  Tobacco Use  . Smoking status: Never Smoker  . Smokeless tobacco: Never Used  Substance and Sexual Activity  . Alcohol use: Not Currently    Alcohol/week: 0.0 standard drinks    Comment: occasional  . Drug use: No  . Sexual activity: Yes  Other Topics Concern  . Not on file  Social History Narrative   Lives in [place], from [place]. Daughter lives here. 4 daughters.   From [place]   Work - retired Data processing manager   Diet - regular   Exercise - bike daily at home   Social Determinants of Health    Financial Resource Strain: [place]   . Difficulty of Paying Living Expenses: Not hard at all  Food Insecurity: No Food Insecurity  . Worried About Charity fundraiser in the Last Year: Never true  . Ran Out of Food in the Last Year: Never true  Transportation Needs: No Transportation Needs  . Lack of Transportation (Medical): No  . Lack of Transportation (Non-Medical): No  Physical Activity: Insufficiently Active  . Days of Exercise per Week: 3 days  . Minutes of Exercise per Session: 30 min  Stress: No Stress Concern Present  . Feeling of Stress : Not at all  Social Connections:   . Frequency of Communication with Friends and Family: Not on file  . Frequency of Social Gatherings with Friends and Family: Not on file  . Attends Religious Services: Not on file  . Active Member of Clubs or Organizations: Not on file  . Attends Archivist Meetings: Not on file  . Marital Status: Not on file    Outpatient Encounter Medications as of 02/02/2019  Medication Sig  . aspirin 81 MG tablet Take 81 mg by mouth every Monday, Wednesday, and Friday.   Marland Kitchen atenolol (TENORMIN) 50 MG tablet TAKE 1 TABLET BY MOUTH  DAILY  . Blood Glucose Monitoring Suppl (ONE TOUCH ULTRA SYSTEM KIT) W/DEVICE KIT 1 kit by Does not apply route once.  . cyanocobalamin (V-R VITAMIN B-12) 500 MCG tablet Take 1 tablet (500 mcg total) by mouth daily.  Marland Kitchen glucose blood test strip TEST ONCE A DAY  . Lancet Device MISC One touch delica - Use as directed  . Lancets Misc. (ACCU-CHEK SOFTCLIX LANCET DEV) KIT Use at home to test blood sugars daily.  Marland Kitchen levothyroxine (SYNTHROID) 112 MCG tablet TAKE 1 TABLET BY MOUTH  DAILY  . losartan (COZAAR) 50 MG tablet TAKE 1 TABLET BY MOUTH  DAILY  . montelukast (SINGULAIR) 10 MG tablet TAKE 1 TABLET BY MOUTH  DAILY  . omeprazole (PRILOSEC) 40 MG capsule Take 1 capsule (40 mg total) by mouth daily.  Glory Rosebush Delica Lancets 49Z MISC Check blood sugar 1 - 2 times daily as needed.  E11.42   No facility-administered encounter medications on file as of 02/02/2019.    Activities of Daily Living In your present state of health, do you have any difficulty performing the following activities: 02/02/2019  Hearing? Y  Comment slight hearing loss  Vision? N  Difficulty concentrating or making decisions? N  Walking or climbing stairs? N  Dressing or bathing? N  Doing errands, shopping? N  Preparing Food and eating ?  N  Using the Toilet? N  In the past six months, have you accidently leaked urine? Y  Comment wears a pantyliner  Do you have problems with loss of bowel control? N  Managing your Medications? N  Managing your Finances? N  Housekeeping or managing your Housekeeping? N  Some recent data might be hidden    Patient Care Team: Ria Bush, MD as PCP - General (Family Medicine) Karren Burly Deirdre Peer, MD as Referring Physician (Ophthalmology)    Assessment:   This is a routine wellness examination for Ajiah.  Exercise Activities and Dietary recommendations Current Exercise Habits: Home exercise routine, Time (Minutes): 30, Frequency (Times/Week): 3, Weekly Exercise (Minutes/Week): 90, Intensity: Mild, Exercise limited by: None identified  Goals    . Patient Stated     Starting 01/31/2018, I will continue to exercise on stationary bike for 30 minutes daily.     . Patient Stated     02/02/2019, I will maintain and continue medications as prescribed.       Fall Risk Fall Risk  02/02/2019 01/31/2018 01/27/2017 12/19/2015 08/13/2015  Falls in the past year? 0 1 No No No  Comment - lost balance and fell on floor in bedroom - - -  Number falls in past yr: 0 0 - - -  Injury with Fall? 0 0 - - -  Risk for fall due to : Medication side effect - - - -  Follow up Falls evaluation completed;Falls prevention discussed - - - -   Is the patient's home free of loose throw rugs in walkways, pet beds, electrical cords, etc?   yes      Grab bars in the  bathroom? yes      Handrails on the stairs?   yes      Adequate lighting?   yes  Timed Get Up and Go performed: N/A  Depression Screen PHQ 2/9 Scores 02/02/2019 01/31/2018 01/27/2017 12/19/2015  PHQ - 2 Score 0 0 0 0  PHQ- 9 Score 0 0 - -     Cognitive Function MMSE - Mini Mental State Exam 02/02/2019 01/31/2018 12/19/2015 12/13/2014  Orientation to time 5 5 5 5   Orientation to Place 5 5 5 5   Registration 3 3 3 3   Attention/ Calculation 5 0 0 5  Recall 3 3 3 3   Language- name 2 objects - 0 0 2  Language- repeat 1 1 1 1   Language- follow 3 step command - 3 3 3   Language- read & follow direction - 0 0 1  Write a sentence - 0 0 1  Copy design - 0 0 1  Total score - 20 20 30   Mini Cog  Mini-Cog screen was completed. Maximum score is 22. A value of 0 denotes this part of the MMSE was not completed or the patient failed this part of the Mini-Cog screening.       Immunization History  Administered Date(s) Administered  . Fluad Quad(high Dose 65+) 10/19/2018  . Influenza Split 11/29/2011  . Influenza, High Dose Seasonal PF 12/13/2014  . Influenza,inj,Quad PF,6+ Mos 12/15/2012, 11/21/2013, 11/13/2015, 10/26/2016, 11/24/2017  . Pneumococcal Conjugate-13 03/23/2013  . Pneumococcal Polysaccharide-23 07/22/1998, 12/31/2011    Qualifies for Shingles Vaccine? Yes  Screening Tests Health Maintenance  Topic Date Due  . DTaP/Tdap/Td (1 - Tdap) 02/15/2019 (Originally 01/27/1954)  . TETANUS/TDAP  02/15/2019 (Originally 01/27/1954)  . HEMOGLOBIN A1C  03/04/2019  . FOOT EXAM  06/12/2019  . MAMMOGRAM  09/08/2019  . OPHTHALMOLOGY EXAM  01/26/2020  .  INFLUENZA VACCINE  Completed  . DEXA SCAN  Completed  . PNA vac Low Risk Adult  Completed    Cancer Screenings: Lung: Low Dose CT Chest recommended if Age 44-80 years, 30 pack-year currently smoking OR have quit w/in 15years. Patient does not qualify. Breast:  Up to date on Mammogram? Yes, completed 09/08/2018   Up to date of Bone  Density/Dexa? Yes, completed 03/21/2012 Colorectal: no longer required  Additional Screenings:  Hepatitis C Screening: N/A     Plan:    Patient will maintain and continue medications as prescribed.    I have personally reviewed and noted the following in the patient's chart:   . Medical and social history . Use of alcohol, tobacco or illicit drugs  . Current medications and supplements . Functional ability and status . Nutritional status . Physical activity . Advanced directives . List of other physicians . Hospitalizations, surgeries, and ER visits in previous 12 months . Vitals . Screenings to include cognitive, depression, and falls . Referrals and appointments  In addition, I have reviewed and discussed with patient certain preventive protocols, quality metrics, and best practice recommendations. A written personalized care plan for preventive services as well as general preventive health recommendations were provided to patient.     Andrez Grime, LPN  [id number]

## 2019-02-02 NOTE — Patient Instructions (Signed)
[name] , Thank you for taking time to come for your Medicare Wellness Visit. I appreciate your ongoing commitment to your health goals. Please review the following plan we discussed and let me know if I can assist you in the future.   Screening recommendations/referrals: Colonoscopy: no longer required Mammogram: Up to date, completed 09/08/2018 Bone Density: Up to date, completed 03/21/2012 Recommended yearly ophthalmology/optometry visit for glaucoma screening and checkup Recommended yearly dental visit for hygiene and checkup  Vaccinations: Influenza vaccine: Up to date, completed 10/19/2018 Pneumococcal vaccine: Completed series Tdap vaccine: decline Shingles vaccine: discussed    Advanced directives: Advance directive discussed with you today. I have provided a copy for you to complete at home and have notarized. Once this is complete please bring a copy in to our office so we can scan it into your chart.  Conditions/risks identified: diabetes, hypertension  Next appointment: 02/14/2019 @ 10:30 am    Preventive Care 65 Years and Older, Female Preventive care refers to lifestyle choices and visits with your health care provider that can promote health and wellness. What does preventive care include?  A yearly physical exam. This is also called an annual well check.  Dental exams once or twice a year.  Routine eye exams. Ask your health care provider how often you should have your eyes checked.  Personal lifestyle choices, including:  Daily care of your teeth and gums.  Regular physical activity.  Eating a healthy diet.  Avoiding tobacco and drug use.  Limiting alcohol use.  Practicing safe sex.  Taking low-dose aspirin every day.  Taking vitamin and mineral supplements as recommended by your health care provider. What happens during an annual well check? The services and screenings done by your health care provider during your annual well check will depend on  your age, overall health, lifestyle risk factors, and family history of disease. Counseling  Your health care provider may ask you questions about your:  Alcohol use.  Tobacco use.  Drug use.  Emotional well-being.  Home and relationship well-being.  Sexual activity.  Eating habits.  History of falls.  Memory and ability to understand (cognition).  Work and work Statistician.  Reproductive health. Screening  You may have the following tests or measurements:  Height, weight, and BMI.  Blood pressure.  Lipid and cholesterol levels. These may be checked every 5 years, or more frequently if you are over 82 years old.  Skin check.  Lung cancer screening. You may have this screening every year starting at age 82 if you have a 30-pack-year history of smoking and currently smoke or have quit within the past 15 years.  Fecal occult blood test (FOBT) of the stool. You may have this test every year starting at age 82.  Flexible sigmoidoscopy or colonoscopy. You may have a sigmoidoscopy every 5 years or a colonoscopy every 10 years starting at age 82.  Hepatitis C blood test.  Hepatitis B blood test.  Sexually transmitted disease (STD) testing.  Diabetes screening. This is done by checking your blood sugar (glucose) after you have not eaten for a while (fasting). You may have this done every 1-3 years.  Bone density scan. This is done to screen for osteoporosis. You may have this done starting at age 82.  Mammogram. This may be done every 1-2 years. Talk to your health care provider about how often you should have regular mammograms. Talk with your health care provider about your test results, treatment options, and if necessary, the  need for more tests. Vaccines  Your health care provider may recommend certain vaccines, such as:  Influenza vaccine. This is recommended every year.  Tetanus, diphtheria, and acellular pertussis (Tdap, Td) vaccine. You may need a Td booster  every 10 years.  Zoster vaccine. You may need this after age 82.  Pneumococcal 13-valent conjugate (PCV13) vaccine. One dose is recommended after age 82.  Pneumococcal polysaccharide (PPSV23) vaccine. One dose is recommended after age 82. Talk to your health care provider about which screenings and vaccines you need and how often you need them. This information is not intended to replace advice given to you by your health care provider. Make sure you discuss any questions you have with your health care provider. Document Released: 02/28/2015 Document Revised: 10/22/2015 Document Reviewed: 12/03/2014 Elsevier Interactive Patient Education  2017 [place] Prevention in the Home Falls can cause injuries. They can happen to people of all ages. There are many things you can do to make your home safe and to help prevent falls. What can I do on the outside of my home?  Regularly fix the edges of walkways and driveways and fix any cracks.  Remove anything that might make you trip as you walk through a door, such as a raised step or threshold.  Trim any bushes or trees on the path to your home.  Use bright outdoor lighting.  Clear any walking paths of anything that might make someone trip, such as rocks or tools.  Regularly check to see if handrails are loose or broken. Make sure that both sides of any steps have handrails.  Any raised decks and porches should have guardrails on the edges.  Have any leaves, snow, or ice cleared regularly.  Use sand or salt on walking paths during winter.  Clean up any spills in your garage right away. This includes oil or grease spills. What can I do in the bathroom?  Use night lights.  Install grab bars by the toilet and in the tub and shower. Do not use towel bars as grab bars.  Use non-skid mats or decals in the tub or shower.  If you need to sit down in the shower, use a plastic, non-slip stool.  Keep the floor dry. Clean up any  water that spills on the floor as soon as it happens.  Remove soap buildup in the tub or shower regularly.  Attach bath mats securely with double-sided non-slip rug tape.  Do not have throw rugs and other things on the floor that can make you trip. What can I do in the bedroom?  Use night lights.  Make sure that you have a light by your bed that is easy to reach.  Do not use any sheets or blankets that are too big for your bed. They should not hang down onto the floor.  Have a firm chair that has side arms. You can use this for support while you get dressed.  Do not have throw rugs and other things on the floor that can make you trip. What can I do in the kitchen?  Clean up any spills right away.  Avoid walking on wet floors.  Keep items that you use a lot in easy-to-reach places.  If you need to reach something above you, use a strong step stool that has a grab bar.  Keep electrical cords out of the way.  Do not use floor polish or wax that makes floors slippery. If you must use wax,  use non-skid floor wax.  Do not have throw rugs and other things on the floor that can make you trip. What can I do with my stairs?  Do not leave any items on the stairs.  Make sure that there are handrails on both sides of the stairs and use them. Fix handrails that are broken or loose. Make sure that handrails are as long as the stairways.  Check any carpeting to make sure that it is firmly attached to the stairs. Fix any carpet that is loose or worn.  Avoid having throw rugs at the top or bottom of the stairs. If you do have throw rugs, attach them to the floor with carpet tape.  Make sure that you have a light switch at the top of the stairs and the bottom of the stairs. If you do not have them, ask someone to add them for you. What else can I do to help prevent falls?  Wear shoes that:  Do not have high heels.  Have rubber bottoms.  Are comfortable and fit you well.  Are closed  at the toe. Do not wear sandals.  If you use a stepladder:  Make sure that it is fully opened. Do not climb a closed stepladder.  Make sure that both sides of the stepladder are locked into place.  Ask someone to hold it for you, if possible.  Clearly mark and make sure that you can see:  Any grab bars or handrails.  First and last steps.  Where the edge of each step is.  Use tools that help you move around (mobility aids) if they are needed. These include:  Canes.  Walkers.  Scooters.  Crutches.  Turn on the lights when you go into a dark area. Replace any light bulbs as soon as they burn out.  Set up your furniture so you have a clear path. Avoid moving your furniture around.  If any of your floors are uneven, fix them.  If there are any pets around you, be aware of where they are.  Review your medicines with your doctor. Some medicines can make you feel dizzy. This can increase your chance of falling. Ask your doctor what other things that you can do to help prevent falls. This information is not intended to replace advice given to you by your health care provider. Make sure you discuss any questions you have with your health care provider. Document Released: 11/28/2008 Document Revised: 07/10/2015 Document Reviewed: 03/08/2014 Elsevier Interactive Patient Education  2017 [employer].

## 2019-02-08 ENCOUNTER — Other Ambulatory Visit: Payer: Self-pay | Admitting: Family Medicine

## 2019-02-14 ENCOUNTER — Encounter: Payer: Self-pay | Admitting: Family Medicine

## 2019-02-14 ENCOUNTER — Other Ambulatory Visit: Payer: Self-pay

## 2019-02-14 ENCOUNTER — Ambulatory Visit (INDEPENDENT_AMBULATORY_CARE_PROVIDER_SITE_OTHER): Payer: Medicare Other | Admitting: Family Medicine

## 2019-02-14 VITALS — BP 134/82 | HR 55 | Temp 97.4°F | Ht 62.0 in | Wt 169.0 lb

## 2019-02-14 DIAGNOSIS — Z0000 Encounter for general adult medical examination without abnormal findings: Secondary | ICD-10-CM | POA: Diagnosis not present

## 2019-02-14 DIAGNOSIS — M8580 Other specified disorders of bone density and structure, unspecified site: Secondary | ICD-10-CM

## 2019-02-14 DIAGNOSIS — E1142 Type 2 diabetes mellitus with diabetic polyneuropathy: Secondary | ICD-10-CM

## 2019-02-14 DIAGNOSIS — Z7189 Other specified counseling: Secondary | ICD-10-CM | POA: Diagnosis not present

## 2019-02-14 DIAGNOSIS — I8311 Varicose veins of right lower extremity with inflammation: Secondary | ICD-10-CM

## 2019-02-14 DIAGNOSIS — H409 Unspecified glaucoma: Secondary | ICD-10-CM | POA: Diagnosis not present

## 2019-02-14 DIAGNOSIS — E538 Deficiency of other specified B group vitamins: Secondary | ICD-10-CM

## 2019-02-14 DIAGNOSIS — E039 Hypothyroidism, unspecified: Secondary | ICD-10-CM | POA: Diagnosis not present

## 2019-02-14 DIAGNOSIS — R252 Cramp and spasm: Secondary | ICD-10-CM

## 2019-02-14 DIAGNOSIS — I10 Essential (primary) hypertension: Secondary | ICD-10-CM

## 2019-02-14 MED ORDER — ASCORBIC ACID 500 MG PO TABS: 500.0000 mg | ORAL_TABLET | Freq: Every day | ORAL | Status: DC

## 2019-02-14 MED ORDER — VITAMIN D3 25 MCG (1000 UT) PO CAPS: 1.0000 | ORAL_CAPSULE | Freq: Every day | ORAL | Status: DC

## 2019-02-14 NOTE — Patient Instructions (Addendum)
Llame para cita para densitometra para seguimiento de osteopenia.  Puede tratar [employer] (pero pare si da diarrea) para prevenir calambre - tambien siga tomado mucha agua.  Resultados de sangre salieron [employer].  Diabetes sigue mejorando, cerca de niveles de prediabetes.  regresar en 4 meses para seguimiento de diabetes.  Trate compression stockings para dolor de venas.   Mantenimiento de la salud despus de los 78 aos de edad Health Maintenance After Age 84 Despus de los 65 aos de edad, corre un riesgo mayor de Tourist information centre manager enfermedades e infecciones a Barrister's clerk, como tambin de sufrir lesiones por cadas. Las cadas son la causa principal de las fracturas de huesos y lesiones en la cabeza de personas mayores de 70 aos de edad. Recibir cuidados preventivos de forma regular puede ayudarlo a mantenerse saludable y en buen [place]. Los cuidados preventivos incluyen realizarse anlisis de forma regular y Actor en el estilo de vida segn las recomendaciones del mdico. Converse con el profesional que lo asiste sobre:  Las pruebas de deteccin y los anlisis que debe Dispensing optician. Una prueba de deteccin es un estudio que se para Hydrographic surveyor la presencia de una enfermedad cuando no tiene sntomas.  Un plan de dieta y ejercicios adecuado para usted. Qu debo saber sobre las pruebas de deteccin y los anlisis para prevenir cadas? Realizarse pruebas de deteccin y [employer] es la mejor manera de Hydrographic surveyor un problema de salud de forma temprana. El diagnstico y tratamiento tempranos le brindan la mejor oportunidad de Chief Technology Officer las afecciones mdicas que son comunes despus de los 2 aos de edad. Ciertas afecciones y elecciones de estilo de vida pueden hacer que sea ms propenso a sufrir Engineer, manufacturing. El mdico puede recomendarle lo siguiente:  Controles regulares de la visin. Una visin deficiente y afecciones como las cataratas pueden hacer que sea ms propenso a sufrir Engineer, manufacturing. Si [place]  lentes, asegrese de obtener una receta actualizada si su visin cambia.  Revisin de medicamentos. Revise regularmente con el mdico todos los medicamentos que toma, incluidos los medicamentos de [place]. Consulte al [employer] efectos secundarios que pueden hacer que sea ms propenso a sufrir Engineer, manufacturing. Informe al mdico si alguno de los medicamentos que toma lo hace sentir mareado o somnoliento.  Pruebas de deteccin para la osteoporosis. La osteoporosis es una afeccin que hace que los huesos se vuelvan ms frgiles. En consecuencia, los huesos pueden debilitarse y quebrarse ms fcilmente.  Pruebas de deteccin para la presin arterial. Los cambios en la presin arterial y los medicamentos para Chief Technology Officer la presin arterial pueden hacerlo sentir mareado.  Controles de fuerza y equilibrio. El mdico puede recomendar ciertos estudios para controlar su fuerza y equilibrio al estar de pie, al caminar o al cambiar de posicin.  Examen de los pies. El dolor y Chiropractor en los pies, como tambin no utilizar el calzado [place], pueden hacer que sea ms propenso a sufrir Engineer, manufacturing.  Prueba de deteccin de la depresin. Es ms probable que sufra una cada si tiene miedo a caerse, se siente mal emocionalmente o se siente incapaz de Patent examiner.  Prueba de deteccin de consumo de alcohol. Beber demasiado alcohol puede afectar su equilibrio y puede hacer que sea ms propenso a sufrir Engineer, manufacturing. Qu medidas puedo tomar para reducir mi riesgo de sufrir una cada? Instrucciones generales  Hable con el mdico sobre sus riesgos de sufrir una cada. Infrmele a su mdico si: ? Se cae. Asegrese de  informarle a su mdico acerca de todas las cadas, incluso aquellas que parecen ser [employer] ? Se siente mareado, somnoliento o que pierde el equilibrio.  Tome los medicamentos de venta libre y los recetados solamente como se lo haya indicado el mdico. Estos incluyen  todos los suplementos.  Siga una dieta sana y [place] un peso saludable. Una dieta saludable incluye productos lcteos descremados, carnes bajas en contenido de grasa ([place], [place] de granos enteros, frijoles y [place] frutas y verduras. La seguridad en el hogar  Retire los objetos que puedan causar tropiezos tales como alfombras, cables u obstculos.  Instale equipos de seguridad, como barras para sostn en los baos y barandas de seguridad en las escaleras.  Vevay habitaciones y los pasillos bien iluminados. Actividad   Siga un programa de ejercicio regular para mantenerse en forma. Esto lo ayudar a Contractor equilibrio. Consulte al mdico qu tipos de ejercicios son adecuados para usted.  Si necesita un bastn o un andador, selo segn las recomendaciones del mdico.  Utilice calzado con buen apoyo y suela antideslizante. Estilo de vida  No beba alcohol si el mdico le indica que no beba.  Si bebe alcohol, limite la cantidad que consume: ? De 0 a 1 medida por da para las mujeres. ? De 0 a 2 medidas por da para los hombres.  Est atento a la cantidad de alcohol que contiene su bebida. En los [place] una medida equivale a una botella tpica de cerveza (12 onzas), media copa de vino (5 onzas) o una medida de bebida blanca (1 onza).  No consuma ningn producto que contenga nicotina o tabaco, como cigarrillos y Psychologist, sport and exercise. Si necesita ayuda para dejar de fumar, consulte al mdico. Resumen  Tener un estilo de vida saludable y recibir cuidados preventivos pueden ayudar a Theatre stage manager salud y el bienestar despus de los 29 aos de [place].  Realizarse pruebas de deteccin y [employer] es la mejor manera de Hydrographic surveyor un problema de salud de forma temprana y Lourena Simmonds a Product/process development scientist una cada. El diagnstico y tratamiento tempranos le brindan la mejor oportunidad de Chief Technology Officer las afecciones mdicas ms comunes en las personas mayores de 63 aos de edad.  Las cadas son la  causa principal de las fracturas de huesos y lesiones en la cabeza de personas mayores de 59 aos de edad. Tome precauciones para evitar una cada en su casa.  Trabaje con el mdico para saber qu cambios que puede hacer para mejorar su salud y [place], y [place]. Esta informacin no tiene Marine scientist el consejo del mdico. Asegrese de hacerle al mdico cualquier pregunta que tenga. Document Released: 03/17/2017 Document Revised: 03/17/2017 Document Reviewed: 03/17/2017 Elsevier Patient Education  2020 [employer].

## 2019-02-14 NOTE — Assessment & Plan Note (Signed)
Advanced directive discussion -does not have available.Planning on seeing lawyer to work on this. Would want daughter [name] to be HCPOA. Packet provided today.

## 2019-02-14 NOTE — Assessment & Plan Note (Signed)
Preventative protocols reviewed and updated unless pt declined. Discussed healthy diet and lifestyle.  

## 2019-02-14 NOTE — Progress Notes (Signed)
This visit was conducted in person.  BP 134/82 (BP Location: Right Arm, Patient Position: Sitting, Cuff Size: Normal)   Pulse (!) 55   Temp (!) 97.4 F (36.3 C) (Temporal)   Ht 5' 2"  (1.575 m)   Wt 169 lb (76.7 kg)   SpO2 97%   BMI 30.91 kg/m    CC: CPE Subjective:    Patient ID: [name], female    DOB: 1934/02/28, 84 y.o.   MRN: [id number]  HPI: PALMA BUSTER is a 84 y.o. female presenting on 02/14/2019 for Annual Exam (Prt 2.  Pt accompanied by husband, [name] [temp, 97.6].) and Medication Refill (Requests new rx for triamcinolone cream 0.1%.)   Saw health advisor 2 weeks ago for medicare wellness visit. Note reviewed.   No exam data present    Clinical Support from 02/02/2019 in [facility]  PHQ-2 Total Score  0      Fall Risk  02/02/2019 01/31/2018 01/27/2017 12/19/2015 08/13/2015  Falls in the past year? 0 1 No No No  Comment - lost balance and fell on floor in bedroom - - -  Number falls in past yr: 0 0 - - -  Injury with Fall? 0 0 - - -  Risk for fall due to : Medication side effect - - - -  Follow up Falls evaluation completed;Falls prevention discussed - - - -    Ongoing chronic R leg pain since earlier this year, despite home exercises and prednisone course (did not tolerate this due to flushing side effect), previously thought sciatica. Describes cramping pain to R posterior thigh that can come randomly throughout the day even when laying down, cannot find comfortable position. Also endorses chronic R medial lower leg pain at venous varicosities. No claudication symptoms.   Preventative: COLONOSCOPY WITH PROPOFOL 07/22/2016 diverticulosis Allen Norris, Darren, MD) Well woman exam -Last normal pap 2014. Aged out. Breast cancer screening -mammo 08/2018 [facility]1 Lung cancer screening -notindicated DEXA scan2014 T -1.5 hip osteopenia - due for repeat Flu shot -yearly Tetanus shot -declines  prevnar 2015, pneumovax 2013 Shingrix -  discussed, declines Advanced directive discussion -does not have available.Planning on seeing lawyer to work on this. Would want daughter Hassan Rowan to be HCPOA. Seat belt use discussed Sunscreen use discussed.No changing moles on skin. Non smoker  Alcoholrare- 1/2 cup wine occasionally  Dentist due - Q2 yrs Eye exam regularly Q4 months   Lives in [place], from [place]. Daughter lives here. 4daughters Work - retired Data processing manager Diet -avoids sugar Exercise -stationarybike daily at home     Relevant past medical, surgical, family and social history reviewed and updated as indicated. Interim medical history since our last visit reviewed. Allergies and medications reviewed and updated. Outpatient Medications Prior to Visit  Medication Sig Dispense Refill  . aspirin 81 MG tablet Take 81 mg by mouth every Monday, Wednesday, and Friday.     Marland Kitchen atenolol (TENORMIN) 50 MG tablet TAKE 1 TABLET BY MOUTH  DAILY 90 tablet 3  . Blood Glucose Monitoring Suppl (ONE TOUCH ULTRA SYSTEM KIT) W/DEVICE KIT 1 kit by Does not apply route once. 1 each 0  . cyanocobalamin (V-R VITAMIN B-12) 500 MCG tablet Take 1 tablet (500 mcg total) by mouth daily.    Marland Kitchen glucose blood test strip TEST ONCE A DAY 100 each 3  . Lancet Device MISC One touch delica - Use as directed 1 each 1  . Lancets Misc. (ACCU-CHEK SOFTCLIX LANCET DEV) KIT Use at home  to test blood sugars daily. 1 kit 0  . levothyroxine (SYNTHROID) 112 MCG tablet TAKE 1 TABLET BY MOUTH  DAILY 90 tablet 3  . losartan (COZAAR) 50 MG tablet TAKE 1 TABLET BY MOUTH  DAILY 90 tablet 3  . montelukast (SINGULAIR) 10 MG tablet TAKE 1 TABLET BY MOUTH  DAILY 90 tablet 3  . omeprazole (PRILOSEC) 40 MG capsule Take 1 capsule (40 mg total) by mouth daily. 90 capsule 1  . OneTouch Delica Lancets 96V MISC Check blood sugar 1 - 2 times daily as needed. E11.42 100 each 1   No facility-administered medications prior to visit.     Per HPI unless specifically indicated in ROS  section below Review of Systems  Constitutional: Negative for activity change, appetite change, chills, fatigue, fever and unexpected weight change.  HENT: Negative for hearing loss.   Eyes: Negative for visual disturbance.  Respiratory: Negative for cough, chest tightness, shortness of breath and wheezing.   Cardiovascular: Positive for leg swelling (occasional). Negative for chest pain and palpitations.  Gastrointestinal: Negative for abdominal distention, abdominal pain, blood in stool, constipation, diarrhea, nausea and vomiting.  Genitourinary: Negative for difficulty urinating and hematuria.  Musculoskeletal: Negative for arthralgias, myalgias and neck pain.  Skin: Negative for rash.  Neurological: Positive for headaches. Negative for dizziness, seizures and syncope.  Hematological: Negative for adenopathy. Does not bruise/bleed easily.  Psychiatric/Behavioral: Negative for dysphoric mood. The patient is not nervous/anxious.    Objective:    BP 134/82 (BP Location: Right Arm, Patient Position: Sitting, Cuff Size: Normal)   Pulse (!) 55   Temp (!) 97.4 F (36.3 C) (Temporal)   Ht 5' 2"  (1.575 m)   Wt 169 lb (76.7 kg)   SpO2 97%   BMI 30.91 kg/m   Wt Readings from Last 3 Encounters:  02/14/19 169 lb (76.7 kg)  09/05/18 170 lb (77.1 kg)  06/12/18 172 lb 8 oz (78.2 kg)    Physical Exam Vitals and nursing note reviewed.  Constitutional:      General: She is not in acute distress.    Appearance: Normal appearance. She is well-developed. She is not ill-appearing.  HENT:     Head: Normocephalic and atraumatic.     Right Ear: Hearing, tympanic membrane, ear canal and external ear normal.     Left Ear: Hearing, tympanic membrane, ear canal and external ear normal.     Mouth/Throat:     Pharynx: Uvula midline.  Eyes:     General: No scleral icterus.    Extraocular Movements: Extraocular movements intact.     Conjunctiva/sclera: Conjunctivae normal.     Pupils: Pupils are  equal, round, and reactive to light.  Neck:     Vascular: No carotid bruit.  Cardiovascular:     Rate and Rhythm: Normal rate and regular rhythm.     Pulses: Normal pulses.          Radial pulses are 2+ on the right side and 2+ on the left side.     Heart sounds: Normal heart sounds. No murmur.  Pulmonary:     Effort: Pulmonary effort is normal. No respiratory distress.     Breath sounds: Normal breath sounds. No wheezing, rhonchi or rales.  Abdominal:     General: Abdomen is flat. Bowel sounds are normal. There is no distension.     Palpations: Abdomen is soft. There is no mass.     Tenderness: There is no abdominal tenderness. There is no guarding or rebound.  Hernia: No hernia is present.  Musculoskeletal:        General: Normal range of motion.     Cervical back: Normal range of motion and neck supple.     Right lower leg: No edema.     Left lower leg: No edema.       Legs:     Comments:  2+ rad pulses bilaterally R upper lower leg with tender varicosities medial to anserine bursa  Lymphadenopathy:     Cervical: No cervical adenopathy.  Skin:    General: Skin is warm and dry.     Findings: No rash.  Neurological:     General: No focal deficit present.     Mental Status: She is alert and oriented to person, place, and time.     Comments: CN grossly intact, station and gait intact  Psychiatric:        Mood and Affect: Mood normal.        Behavior: Behavior normal.        Thought Content: Thought content normal.        Judgment: Judgment normal.    Diabetic Foot Exam - Simple   Simple Foot Form Diabetic Foot exam was performed with the following findings: Yes 02/14/2019 11:18 AM  Visual Inspection No deformities, no ulcerations, no other skin breakdown bilaterally: Yes Sensation Testing See comments: Yes Pulse Check Posterior Tibialis and Dorsalis pulse intact bilaterally: Yes Comments Diminished to monofilament throughout        Results for orders placed  or performed in visit on 02/02/19  TSH  Result Value Ref Range   TSH 1.34 0.35 - 4.50 uIU/mL  Hemoglobin A1c  Result Value Ref Range   Hgb A1c MFr Bld 6.5 4.6 - 6.5 %  Comprehensive metabolic panel  Result Value Ref Range   Sodium 138 135 - 145 mEq/L   Potassium 4.5 3.5 - 5.1 mEq/L   Chloride 103 96 - 112 mEq/L   CO2 29 19 - 32 mEq/L   Glucose, Bld 95 70 - 99 mg/dL   BUN 19 6 - 23 mg/dL   Creatinine, Ser 0.79 0.40 - 1.20 mg/dL   Total Bilirubin 0.6 0.2 - 1.2 mg/dL   Alkaline Phosphatase 72 39 - 117 U/L   AST 18 0 - 37 U/L   ALT 29 0 - 35 U/L   Total Protein 6.9 6.0 - 8.3 g/dL   Albumin 4.3 3.5 - 5.2 g/dL   GFR 69.67 >60.00 mL/min   Calcium 9.4 8.4 - 10.5 mg/dL  Lipid panel  Result Value Ref Range   Cholesterol 122 0 - 200 mg/dL   Triglycerides 90.0 0.0 - 149.0 mg/dL   HDL 45.20 >39.00 mg/dL   VLDL 18.0 0.0 - 40.0 mg/dL   LDL Cholesterol 59 0 - 99 mg/dL   Total CHOL/HDL Ratio 3    NonHDL 76.67   Vitamin B12  Result Value Ref Range   Vitamin B-12 480 211 - 911 pg/mL   Assessment & Plan:  This visit occurred during the SARS-CoV-2 public health emergency.  Safety protocols were in place, including screening questions prior to the visit, additional usage of staff PPE, and extensive cleaning of exam room while observing appropriate contact time as indicated for disinfecting solutions.   Problem List Items Addressed This Visit    Varicose veins of right lower extremity with inflammation    Exam suggestive of painful varicose veins. rec compression stocking use.       Type 2 diabetes,  controlled, with peripheral neuropathy (HCC)    Chronic, improving. Foot exam with evidence of diminished sensation to monofilament testing. Congratulated on improving readings.       Osteopenia    DEXA ordered over the summer but she has not had a chance to complete - will call to schedule. Continue good calcium intake in diet and regular weight bearing exercise.       Low vitamin B12 level     Continue b12 562mg replacement.       Leg cramps    Can be severe, worse at night. Suggested good water intake and trial of magnesium PRN.       Hypothyroidism    Levels stable- continue levothyroxine.       Hypertension    Chronic, stable. Continue current regimen.       Health maintenance examination - Primary    Preventative protocols reviewed and updated unless pt declined. Discussed healthy diet and lifestyle.       Glaucoma    Continues regular ophtho f/u.      Advanced care planning/counseling discussion    Advanced directive discussion -does not have available.Planning on seeing lawyer to work on this. Would want daughter BHassan Rowanto be HCPOA. Packet provided today.           Meds ordered this encounter  Medications  . Cholecalciferol (VITAMIN D3) 25 MCG (1000 UT) CAPS    Sig: Take 1 capsule (1,000 Units total) by mouth daily.    Dispense:  30 capsule  . ascorbic acid (VITAMIN C) 500 MG tablet    Sig: Take 1 tablet (500 mg total) by mouth daily.    Dispense:     . triamcinolone cream (KENALOG) 0.1 %    Sig: Apply 1 application topically 2 (two) times daily. Apply to AA.    Dispense:  45 g    Refill:  1   No orders of the defined types were placed in this encounter.   Patient instructions: Llame para cita para densitometra para seguimiento de osteopenia.  Puede tratar m[employer](pero pare si da diarrea) para prevenir calambre - tambien siga tomado mucha agua.  Resultados de sangre salieron [employer]  Diabetes sigue mejorando, cerca de niveles de prediabetes.  regresar en 4 meses para seguimiento de diabetes.  Trate compression stockings para dolor de venas.   Follow up plan: Return in about 4 months (around 06/15/2019) for follow up visit.  JRia Bush MD

## 2019-02-15 ENCOUNTER — Encounter: Payer: Self-pay | Admitting: Family Medicine

## 2019-02-15 DIAGNOSIS — I8311 Varicose veins of right lower extremity with inflammation: Secondary | ICD-10-CM | POA: Insufficient documentation

## 2019-02-15 MED ORDER — TRIAMCINOLONE ACETONIDE 0.1 % EX CREA: 1.0000 "application " | TOPICAL_CREAM | Freq: Two times a day (BID) | CUTANEOUS | 1 refills | Status: DC

## 2019-02-15 NOTE — Assessment & Plan Note (Signed)
Can be severe, worse at night. Suggested good water intake and trial of magnesium PRN.

## 2019-02-15 NOTE — Assessment & Plan Note (Signed)
Continue b12 565mcg replacement.

## 2019-02-15 NOTE — Assessment & Plan Note (Signed)
Chronic, stable. Continue current regimen. 

## 2019-02-15 NOTE — Assessment & Plan Note (Signed)
Chronic, improving. Foot exam with evidence of diminished sensation to monofilament testing. Congratulated on improving readings.

## 2019-02-15 NOTE — Assessment & Plan Note (Signed)
DEXA ordered over the summer but she has not had a chance to complete - will call to schedule. Continue good calcium intake in diet and regular weight bearing exercise.

## 2019-02-15 NOTE — Assessment & Plan Note (Signed)
Continues regular ophtho f/u.

## 2019-02-15 NOTE — Assessment & Plan Note (Signed)
Levels stable- continue levothyroxine.  

## 2019-02-16 NOTE — Assessment & Plan Note (Signed)
Exam suggestive of painful varicose veins. rec compression stocking use.

## 2019-02-16 NOTE — Addendum Note (Signed)
Addended by: Ria Bush on: 02/16/2019 01:48 PM   Modules accepted: Level of Service

## 2019-02-26 ENCOUNTER — Other Ambulatory Visit: Payer: Self-pay | Admitting: Family Medicine

## 2019-03-08 ENCOUNTER — Other Ambulatory Visit: Payer: Self-pay | Admitting: Family Medicine

## 2019-03-20 ENCOUNTER — Other Ambulatory Visit: Payer: Self-pay | Admitting: Family Medicine

## 2019-03-28 ENCOUNTER — Other Ambulatory Visit: Payer: Self-pay | Admitting: Family Medicine

## 2019-05-31 DIAGNOSIS — H401133 Primary open-angle glaucoma, bilateral, severe stage: Secondary | ICD-10-CM | POA: Diagnosis not present

## 2019-06-13 ENCOUNTER — Other Ambulatory Visit: Payer: Self-pay | Admitting: Family Medicine

## 2019-06-14 ENCOUNTER — Other Ambulatory Visit: Payer: Self-pay | Admitting: Family Medicine

## 2019-06-14 DIAGNOSIS — E1142 Type 2 diabetes mellitus with diabetic polyneuropathy: Secondary | ICD-10-CM

## 2019-06-14 DIAGNOSIS — E538 Deficiency of other specified B group vitamins: Secondary | ICD-10-CM

## 2019-06-14 DIAGNOSIS — K760 Fatty (change of) liver, not elsewhere classified: Secondary | ICD-10-CM

## 2019-06-14 DIAGNOSIS — E039 Hypothyroidism, unspecified: Secondary | ICD-10-CM

## 2019-06-15 ENCOUNTER — Other Ambulatory Visit (INDEPENDENT_AMBULATORY_CARE_PROVIDER_SITE_OTHER): Payer: Medicare Other

## 2019-06-15 ENCOUNTER — Other Ambulatory Visit: Payer: Self-pay

## 2019-06-15 DIAGNOSIS — K760 Fatty (change of) liver, not elsewhere classified: Secondary | ICD-10-CM | POA: Diagnosis not present

## 2019-06-15 DIAGNOSIS — E538 Deficiency of other specified B group vitamins: Secondary | ICD-10-CM | POA: Diagnosis not present

## 2019-06-15 DIAGNOSIS — E1142 Type 2 diabetes mellitus with diabetic polyneuropathy: Secondary | ICD-10-CM | POA: Diagnosis not present

## 2019-06-15 DIAGNOSIS — E039 Hypothyroidism, unspecified: Secondary | ICD-10-CM | POA: Diagnosis not present

## 2019-06-15 LAB — COMPREHENSIVE METABOLIC PANEL
ALT: 30 U/L (ref 0–35)
AST: 19 U/L (ref 0–37)
Albumin: 4.2 g/dL (ref 3.5–5.2)
Alkaline Phosphatase: 66 U/L (ref 39–117)
BUN: 17 mg/dL (ref 6–23)
CO2: 31 mEq/L (ref 19–32)
Calcium: 9.2 mg/dL (ref 8.4–10.5)
Chloride: 105 mEq/L (ref 96–112)
Creatinine, Ser: 0.85 mg/dL (ref 0.40–1.20)
GFR: 63.97 mL/min (ref >60.00)
Glucose, Bld: 124 mg/dL — ABNORMAL HIGH (ref 70–99)
Potassium: 4.6 mEq/L (ref 3.5–5.1)
Sodium: 142 mEq/L (ref 135–145)
Total Bilirubin: 0.7 mg/dL (ref 0.2–1.2)
Total Protein: 6.4 g/dL (ref 6.0–8.3)

## 2019-06-15 LAB — HEMOGLOBIN A1C: Hgb A1c MFr Bld: 6.6 % — ABNORMAL HIGH (ref 4.6–6.5)

## 2019-06-15 LAB — LIPID PANEL
Cholesterol: 128 mg/dL (ref 0–200)
HDL: 46.9 mg/dL (ref >39.00)
LDL Cholesterol: 70 mg/dL (ref 0–99)
NonHDL: 81.06
Total CHOL/HDL Ratio: 3
Triglycerides: 53 mg/dL (ref 0.0–149.0)
VLDL: 10.6 mg/dL (ref 0.0–40.0)

## 2019-06-15 LAB — VITAMIN B12: Vitamin B-12: 595 pg/mL (ref 211–911)

## 2019-06-15 LAB — TSH: TSH: 1.4 u[IU]/mL (ref 0.35–4.50)

## 2019-06-18 ENCOUNTER — Other Ambulatory Visit: Payer: Self-pay

## 2019-06-18 ENCOUNTER — Encounter: Payer: Self-pay | Admitting: Family Medicine

## 2019-06-18 ENCOUNTER — Ambulatory Visit (INDEPENDENT_AMBULATORY_CARE_PROVIDER_SITE_OTHER): Payer: Medicare Other | Admitting: Family Medicine

## 2019-06-18 VITALS — BP 118/72 | HR 60 | Temp 97.7°F | Ht 62.0 in | Wt 171.3 lb

## 2019-06-18 DIAGNOSIS — K760 Fatty (change of) liver, not elsewhere classified: Secondary | ICD-10-CM

## 2019-06-18 DIAGNOSIS — E1142 Type 2 diabetes mellitus with diabetic polyneuropathy: Secondary | ICD-10-CM | POA: Diagnosis not present

## 2019-06-18 DIAGNOSIS — Z01419 Encounter for gynecological examination (general) (routine) without abnormal findings: Secondary | ICD-10-CM | POA: Diagnosis not present

## 2019-06-18 DIAGNOSIS — R252 Cramp and spasm: Secondary | ICD-10-CM

## 2019-06-18 DIAGNOSIS — R1013 Epigastric pain: Secondary | ICD-10-CM | POA: Diagnosis not present

## 2019-06-18 DIAGNOSIS — E039 Hypothyroidism, unspecified: Secondary | ICD-10-CM

## 2019-06-18 MED ORDER — ATORVASTATIN CALCIUM 20 MG PO TABS: 20.0000 mg | ORAL_TABLET | Freq: Every day | ORAL | 3 refills | Status: DC

## 2019-06-18 MED ORDER — OMEPRAZOLE 40 MG PO CPDR: 40.0000 mg | DELAYED_RELEASE_CAPSULE | Freq: Every day | ORAL | 3 refills | Status: DC

## 2019-06-18 NOTE — Assessment & Plan Note (Addendum)
Chronic, stable. Diet control.

## 2019-06-18 NOTE — Patient Instructions (Addendum)
Gusto verla hoy. Empieze lipitor (atorvastatina) 20mg  diarios de noche.  Mantenga diario de comida cuando se le hincha la cara y me deja saber. Regresar en 4 meses para proxima visita.

## 2019-06-18 NOTE — Assessment & Plan Note (Signed)
TSH stable.  

## 2019-06-18 NOTE — Progress Notes (Signed)
This visit was conducted in person.  BP 118/72 (BP Location: Left Arm, Patient Position: Sitting, Cuff Size: Normal)   Pulse 60   Temp 97.7 F (36.5 C) (Temporal)   Ht 5' 2"  (1.575 m)   Wt 171 lb 5 oz (77.7 kg)   SpO2 96%   BMI 31.33 kg/m    CC: 4 mo f/u visit  Subjective:    Patient ID: [name], female    DOB: March 27, 1934, 85 y.o.   MRN: [id number]  HPI: [name] is a 85 y.o. female presenting on 06/18/2019 for Follow-up (Here for 4 mo f/u.)   Glaucoma followed by [facility] Dr Darrin Luis.   Notes swollen face occasionally - will start keeping food diary.   Notes swollen stomach in the morning, associated gassiness, indigestion and bloating. No early satiety, nausea/vomiting. Has been taking GasX PRN with some benefit. Not taking omeprazole. Would like to retry this. No nausea/vomiting diarrhea or constipation.   Ongoing leg cramping worse at night. She already uses loose fitting blankets, drinks plenty of water. She could do better with stretching and elliptical use.   DM - does regularly check sugars 110s fasting. Compliant with antihyperglycemic regimen which includes: diet controlled. Denies low sugars or hypoglycemic symptoms. Minimal paresthesias. Last diabetic eye exam 01/2019. Pneumovax: 2013, 2020. Prevnar: 2015. Glucometer brand: one-touch. DSME: completed remotely. Lab Results  Component Value Date   HGBA1C 6.6 (H) 06/15/2019   Diabetic Foot Exam - Simple   No data filed     Lab Results  Component Value Date   MICROALBUR 2.2 (H) 08/13/2015        Relevant past medical, surgical, family and social history reviewed and updated as indicated. Interim medical history since our last visit reviewed. Allergies and medications reviewed and updated. Outpatient Medications Prior to Visit  Medication Sig Dispense Refill  . ascorbic acid (VITAMIN C) 500 MG tablet Take 1 tablet (500 mg total) by mouth daily.    Marland Kitchen aspirin 81 MG tablet Take 81 mg by mouth  every Monday, Wednesday, and Friday.     Marland Kitchen atenolol (TENORMIN) 50 MG tablet TAKE 1 TABLET BY MOUTH  DAILY 90 tablet 3  . Blood Glucose Monitoring Suppl (ONE TOUCH ULTRA SYSTEM KIT) W/DEVICE KIT 1 kit by Does not apply route once. 1 each 0  . Cholecalciferol (VITAMIN D3) 25 MCG (1000 UT) CAPS Take 1 capsule (1,000 Units total) by mouth daily. 30 capsule   . cyanocobalamin (V-R VITAMIN B-12) 500 MCG tablet Take 1 tablet (500 mcg total) by mouth daily.    Elmore Guise Device MISC One touch delica - Use as directed 1 each 1  . Lancets (ONETOUCH DELICA PLUS [id number]) MISC CHECK BLOOD SUGAR 1-2 TIMES DAILY AS NEEDED 100 each 3  . Lancets Misc. (ACCU-CHEK SOFTCLIX LANCET DEV) KIT Use at home to test blood sugars daily. 1 kit 0  . levothyroxine (SYNTHROID) 112 MCG tablet TAKE 1 TABLET BY MOUTH  DAILY 90 tablet 3  . losartan (COZAAR) 50 MG tablet TAKE 1 TABLET BY MOUTH  DAILY 90 tablet 3  . montelukast (SINGULAIR) 10 MG tablet TAKE 1 TABLET BY MOUTH  DAILY 90 tablet 3  . ONETOUCH ULTRA test strip TEST AS DIRECTED ONCE DAILY 100 strip 3  . triamcinolone cream (KENALOG) 0.1 % Apply 1 application topically 2 (two) times daily. Apply to AA. 45 g 1  . omeprazole (PRILOSEC) 40 MG capsule TAKE 1 CAPSULE BY MOUTH  DAILY 90 capsule 1  No facility-administered medications prior to visit.     Per HPI unless specifically indicated in ROS section below Review of Systems Objective:  BP 118/72 (BP Location: Left Arm, Patient Position: Sitting, Cuff Size: Normal)   Pulse 60   Temp 97.7 F (36.5 C) (Temporal)   Ht 5' 2"  (1.575 m)   Wt 171 lb 5 oz (77.7 kg)   SpO2 96%   BMI 31.33 kg/m   Wt Readings from Last 3 Encounters:  06/18/19 171 lb 5 oz (77.7 kg)  02/14/19 169 lb (76.7 kg)  09/05/18 170 lb (77.1 kg)      Physical Exam Vitals reviewed.  Constitutional:      Appearance: Normal appearance. She is not ill-appearing.  HENT:     Head: Normocephalic and atraumatic.  Eyes:     General:        Right  eye: No discharge.        Left eye: No discharge.     Extraocular Movements: Extraocular movements intact.     Conjunctiva/sclera: Conjunctivae normal.     Pupils: Pupils are equal, round, and reactive to light.  Cardiovascular:     Rate and Rhythm: Normal rate and regular rhythm.     Pulses: Normal pulses.     Heart sounds: Normal heart sounds. No murmur.  Pulmonary:     Effort: Pulmonary effort is normal. No respiratory distress.     Breath sounds: Normal breath sounds. No wheezing, rhonchi or rales.  Abdominal:     General: Abdomen is flat. Bowel sounds are normal. There is no distension.     Palpations: Abdomen is soft. There is no mass.     Tenderness: There is no abdominal tenderness. There is no right CVA tenderness, left CVA tenderness, guarding or rebound.     Hernia: No hernia is present.  Musculoskeletal:     Right lower leg: No edema.     Left lower leg: No edema.  Skin:    General: Skin is warm and dry.     Findings: No rash.  Neurological:     Mental Status: She is alert.  Psychiatric:        Mood and Affect: Mood normal.        Behavior: Behavior normal.       Results for orders placed or performed in visit on 06/15/19  Vitamin B12  Result Value Ref Range   Vitamin B-12 595 211 - 911 pg/mL  TSH  Result Value Ref Range   TSH 1.40 0.35 - 4.50 uIU/mL  Hemoglobin A1c  Result Value Ref Range   Hgb A1c MFr Bld 6.6 (H) 4.6 - 6.5 %  Comprehensive metabolic panel  Result Value Ref Range   Sodium 142 135 - 145 mEq/L   Potassium 4.6 3.5 - 5.1 mEq/L   Chloride 105 96 - 112 mEq/L   CO2 31 19 - 32 mEq/L   Glucose, Bld 124 (H) 70 - 99 mg/dL   BUN 17 6 - 23 mg/dL   Creatinine, Ser 0.85 0.40 - 1.20 mg/dL   Total Bilirubin 0.7 0.2 - 1.2 mg/dL   Alkaline Phosphatase 66 39 - 117 U/L   AST 19 0 - 37 U/L   ALT 30 0 - 35 U/L   Total Protein 6.4 6.0 - 8.3 g/dL   Albumin 4.2 3.5 - 5.2 g/dL   GFR 63.97 >60.00 mL/min   Calcium 9.2 8.4 - 10.5 mg/dL  Lipid panel  Result  Value Ref Range   Cholesterol  128 0 - 200 mg/dL   Triglycerides 53.0 0.0 - 149.0 mg/dL   HDL 46.90 >39.00 mg/dL   VLDL 10.6 0.0 - 40.0 mg/dL   LDL Cholesterol 70 0 - 99 mg/dL   Total CHOL/HDL Ratio 3    NonHDL 81.06    Assessment & Plan:  This visit occurred during the SARS-CoV-2 public health emergency.  Safety protocols were in place, including screening questions prior to the visit, additional usage of staff PPE, and extensive cleaning of exam room while observing appropriate contact time as indicated for disinfecting solutions.  Requests GYN referral to establish for well woman exam (none recently).  Problem List Items Addressed This Visit    Type 2 diabetes, controlled, with peripheral neuropathy ([place]) - Primary    Chronic, stable. Diet control.       Relevant Medications   atorvastatin (LIPITOR) 20 MG tablet   Leg cramps    Ongoing, severe. No improvement despite good hydration. She uses loose fitting bed sheets.  Recent labs show normal lytes. Consider checking Mg for further evaluation.  Discussed importance of regular stretching and regular exercise. If no better with this, consider medications such as vitamin B complex and vitamin E, diphenhydramine, diltiazem, verapamil, or gabapentin, for which there are some data for efficacy for nocturnal leg cramps.       Hypothyroidism    TSH stable.       Fatty liver disease, nonalcoholic    H/o this, LFTs stable.       Abdominal discomfort, epigastric    Chronic, ongoing. PPI refilled. ?IBS component. Reviewed recent abdominal imaging including abd [place] and MRI from several years ago. She remains concerned about pelvic etiology despite previous unrevealing pelvic ultrasound 04/2017 (transabdominal only).        Other Visit Diagnoses    Well woman exam       Relevant Orders   Ambulatory referral to Gynecology       Meds ordered this encounter  Medications  . omeprazole (PRILOSEC) 40 MG capsule    Sig: Take 1 capsule (40  mg total) by mouth daily.    Dispense:  90 capsule    Refill:  3  . atorvastatin (LIPITOR) 20 MG tablet    Sig: Take 1 tablet (20 mg total) by mouth daily.    Dispense:  90 tablet    Refill:  3   Orders Placed This Encounter  Procedures  . Ambulatory referral to Gynecology    Referral Priority:   Routine    Referral Type:   Consultation    Referral Reason:   Specialty Services Required    Requested Specialty:   Gynecology    Number of Visits Requested:   1    Patient Instructions  Gusto verla hoy. Empieze lipitor (atorvastatina) 54m diarios de noche.  Mantenga diario de comida cuando se le hincha la cara y me deja saber. Regresar en 4 meses para proxima visita.    Follow up plan: Return in about 3 months (around 09/18/2019) for follow up visit.  JRia Bush MD

## 2019-06-19 NOTE — Assessment & Plan Note (Signed)
H/o this, LFTs stable.

## 2019-06-19 NOTE — Assessment & Plan Note (Addendum)
Chronic, ongoing. PPI refilled. ?IBS component. Reviewed recent abdominal imaging including abd [place] and MRI from several years ago. She remains concerned about pelvic etiology despite previous unrevealing pelvic ultrasound 04/2017 (transabdominal only).

## 2019-06-19 NOTE — Assessment & Plan Note (Addendum)
Ongoing, severe. No improvement despite good hydration. She uses loose fitting bed sheets.  Recent labs show normal lytes. Consider checking Mg for further evaluation.  Discussed importance of regular stretching and regular exercise. If no better with this, consider medications such as vitamin B complex and vitamin E, diphenhydramine, diltiazem, verapamil, or gabapentin, for which there are some data for efficacy for nocturnal leg cramps.

## 2019-06-25 ENCOUNTER — Ambulatory Visit (INDEPENDENT_AMBULATORY_CARE_PROVIDER_SITE_OTHER): Payer: Medicare Other | Admitting: Obstetrics

## 2019-06-25 ENCOUNTER — Other Ambulatory Visit: Payer: Self-pay

## 2019-06-25 ENCOUNTER — Encounter: Payer: Self-pay | Admitting: Obstetrics

## 2019-06-25 ENCOUNTER — Other Ambulatory Visit (HOSPITAL_COMMUNITY)
Admission: RE | Admit: 2019-06-25 | Discharge: 2019-06-25 | Disposition: A | Discharge status: Home / Self Care | Payer: Medicare Other | Source: Ambulatory Visit | Attending: Obstetrics | Admitting: Obstetrics

## 2019-06-25 VITALS — BP 158/88 | Ht 62.0 in | Wt 171.0 lb

## 2019-06-25 DIAGNOSIS — Z01419 Encounter for gynecological examination (general) (routine) without abnormal findings: Secondary | ICD-10-CM | POA: Diagnosis present

## 2019-06-25 NOTE — Progress Notes (Signed)
Obstetrics & Gynecology Office Visit   Chief Complaint:  Chief Complaint  Patient presents with  . Gynecologic Exam  . Abdominal Pain    central and right side    History of Present Illness: [name] presents as a new patient for an annual GYN physical. Her last GYN exam was over 5 years ago. Her previous GYN MD moved away from the area. [name] has had yearly mammograms. Her last pap smear was 5 years ago. She has never had an abnormal pap smear. She denies any major complaints. She has occasional abdominal pain, but this is largly digestive. She is married, with 4 adult children (all vaginal births, including twins)    Review of Systems: reveals a hx of headaches. ROS was reviewed with the patient.  Past Medical History:  Past Medical History:  Diagnosis Date  . Allergy    hay fever  . Arthritis   . Cataract    resolved with surgery  . Chicken pox   . Colon polyp   . Diabetes mellitus 2008  . Generalized headaches    thinks caused by glaucoma  . Glaucoma   . Hypertension   . Right sided sciatica 07/03/2013  . Thyroid disease    hypothyroidism  . Urinary incontinence   . UTI (urinary tract infection)     Past Surgical History:  Past Surgical History:  Procedure Laterality Date  . APPENDECTOMY  1998  . BUNIONECTOMY    . CHOLECYSTECTOMY    . COLONOSCOPY  2007   int hem, poor bowel prep, normal barium enema in following (Competiello)  . COLONOSCOPY WITH PROPOFOL N/A 07/22/2016   diverticulosis Allen Norris, Darren, MD)  . EYE SURGERY     R  eye-lid drop surgery  . TONSILLECTOMY      Gynecologic History: No LMP recorded. Patient is postmenopausal.  Obstetric History: No obstetric history on file.  Family History:  Family History  Problem Relation Age of Onset  . Heart disease Mother   . Asthma Mother   . Heart attack Mother 48       aneurysm ruptured by heart  . Heart disease Father   . Deep vein thrombosis Father   . Leukemia Brother   . Cancer Brother   .  Diabetes Sister   . Crohn's disease Brother   . Diabetes Brother   . Arthritis Other   . Diabetes Other   . Cancer Maternal Aunt        ovarian?  . Cancer Maternal Grandfather        colon  . Breast cancer Neg Hx     Social History:  Social History   Socioeconomic History  . Marital status: Married    Spouse name: Not on file  . Number of children: Not on file  . Years of education: Not on file  . Highest education level: Not on file  Occupational History  . Not on file  Tobacco Use  . Smoking status: Never Smoker  . Smokeless tobacco: Never Used  Substance and Sexual Activity  . Alcohol use: Not Currently    Alcohol/week: 0.0 standard drinks    Comment: occasional  . Drug use: No  . Sexual activity: Yes  Other Topics Concern  . Not on file  Social History Narrative   Lives in [place], from [place]. Daughter lives here. 4 daughters.   From [place]   Work - retired Data processing manager   Diet - regular   Exercise - bike daily at home  Social Determinants of Health   Financial Resource Strain: Low Risk   . Difficulty of Paying Living Expenses: Not hard at all  Food Insecurity: No Food Insecurity  . Worried About Charity fundraiser in the Last Year: Never true  . Ran Out of Food in the Last Year: Never true  Transportation Needs: No Transportation Needs  . Lack of Transportation (Medical): No  . Lack of Transportation (Non-Medical): No  Physical Activity: Insufficiently Active  . Days of Exercise per Week: 3 days  . Minutes of Exercise per Session: 30 min  Stress: No Stress Concern Present  . Feeling of Stress : Not at all  Social Connections:   . Frequency of Communication with Friends and Family:   . Frequency of Social Gatherings with Friends and Family:   . Attends Religious Services:   . Active Member of Clubs or Organizations:   . Attends Archivist Meetings:   Marland Kitchen Marital Status:   Intimate Partner Violence: Not At Risk  . Fear of Current or Ex-Partner: No    . Emotionally Abused: No  . Physically Abused: No  . Sexually Abused: No    Allergies:  Allergies  Allergen Reactions  . Align [Acidophilus]     Worsened abd bloating, pressure  . Augmentin [Amoxicillin-Pot Clavulanate]     Possible, had itchy rash  . Cefprozil Swelling  . Metformin And Related Other (See Comments)    Severe stomach pains, muscle aches  . Timolol     Local reaction to eye drops    Medications: Prior to Admission medications   Medication Sig Start Date End Date Taking? Authorizing Provider  ascorbic acid (VITAMIN C) 500 MG tablet Take 1 tablet (500 mg total) by mouth daily. 02/14/19  Yes Ria Bush, MD  aspirin 81 MG tablet Take 81 mg by mouth every Monday, Wednesday, and Friday.    Yes [provider]  atenolol (TENORMIN) 50 MG tablet TAKE 1 TABLET BY MOUTH  DAILY 03/09/19  Yes Ria Bush, MD  atorvastatin (LIPITOR) 20 MG tablet Take 1 tablet (20 mg total) by mouth daily. 06/18/19  Yes Ria Bush, MD  Blood Glucose Monitoring Suppl (ONE TOUCH ULTRA SYSTEM KIT) W/DEVICE KIT 1 kit by Does not apply route once. 12/27/13  Yes Jackolyn Confer, MD  Cholecalciferol (VITAMIN D3) 25 MCG (1000 UT) CAPS Take 1 capsule (1,000 Units total) by mouth daily. 02/14/19  Yes Ria Bush, MD  cyanocobalamin (V-R VITAMIN B-12) 500 MCG tablet Take 1 tablet (500 mcg total) by mouth daily. 06/24/16  Yes Ria Bush, MD  Lancet Device MISC One touch Donaciano Eva - Use as directed 06/24/16  Yes Ria Bush, MD  Lancets [facility] DELICA PLUS [id number]) MISC CHECK BLOOD SUGAR 1-2 TIMES DAILY AS NEEDED 06/13/19  Yes Ria Bush, MD  Lancets Misc. (ACCU-CHEK SOFTCLIX LANCET DEV) KIT Use at home to test blood sugars daily. 06/24/16  Yes Ria Bush, MD  levothyroxine (SYNTHROID) 112 MCG tablet TAKE 1 TABLET BY MOUTH  DAILY 02/13/19  Yes Ria Bush, MD  losartan (COZAAR) 50 MG tablet TAKE 1 TABLET BY MOUTH  DAILY 02/27/19  Yes Ria Bush, MD  montelukast (SINGULAIR) 10 MG tablet TAKE 1 TABLET BY MOUTH  DAILY 02/27/19  Yes Ria Bush, MD  omeprazole (PRILOSEC) 40 MG capsule Take 1 capsule (40 mg total) by mouth daily. 06/18/19  Yes Ria Bush, MD  [facility] ULTRA test strip TEST AS DIRECTED ONCE DAILY 03/28/19  Yes Ria Bush, MD  triamcinolone cream (  KENALOG) 0.1 % Apply 1 application topically 2 (two) times daily. Apply to [place]. 02/15/19 02/15/20 Yes Ria Bush, MD    Physical Exam Vitals:  Vitals:   06/25/19 1420  BP: (!) 158/88   No LMP recorded. Patient is postmenopausal.  General: NAD HEENT: normocephalic, anicteric Thyroid: no enlargement, no palpable nodules Pulmonary: No increased work of breathing Cardiovascular: RRR, distal pulses 2+ Abdomen: NABS, soft, non-tender, non-distended.  Umbilicus without lesions.  No hepatomegaly, splenomegaly or masses palpable. No evidence of hernia  Genitourinary:  External: Normal external female genitalia.  Normal urethral meatus, normal  Bartholin's and Skene's glands.    Vagina: Normal vaginal mucosa, no evidence of prolapse.    Cervix: Grossly normal in appearance, no bleeding  Uterus: Non-enlarged, mobile, normal contour.  No CMT  Adnexa: ovaries non-enlarged, no adnexal masses  Rectal: deferred  Lymphatic: no evidence of inguinal lymphadenopathy Extremities: no edema, erythema, or tenderness Neurologic: Grossly intact Psychiatric: mood appropriate, affect full  Female chaperone present for pelvic  portions of the physical exam  Assessment: 85 y.o. No obstetric history on file.New patietn for annual PE  Plan: Problem List Items Addressed This Visit    None    Visit Diagnoses    Encounter for annual routine gynecological examination    -  Primary   Relevant Orders   Cytology - PAP     We discussed the recommendation that women do not need a pap smear after age 61. We did perform a pap at her request, and she plans to stop having  paps after this one.   Annual mammograms advised. Her PCP has ordered her mammograms in the past. RTC in one year.     Jennifer Riches MSN, CNM  Jennifer Riches MSN, CNM

## 2019-06-26 ENCOUNTER — Ambulatory Visit: Payer: Medicare Other | Admitting: Podiatry

## 2019-06-27 LAB — CYTOLOGY - PAP: Diagnosis: NEGATIVE

## 2019-07-05 ENCOUNTER — Ambulatory Visit: Payer: Medicare Other | Admitting: Podiatry

## 2019-07-05 ENCOUNTER — Other Ambulatory Visit: Payer: Self-pay

## 2019-07-05 ENCOUNTER — Encounter: Payer: Self-pay | Admitting: Podiatry

## 2019-07-05 DIAGNOSIS — M79675 Pain in left toe(s): Secondary | ICD-10-CM | POA: Diagnosis not present

## 2019-07-05 DIAGNOSIS — E1142 Type 2 diabetes mellitus with diabetic polyneuropathy: Secondary | ICD-10-CM | POA: Diagnosis not present

## 2019-07-05 DIAGNOSIS — Q828 Other specified congenital malformations of skin: Secondary | ICD-10-CM | POA: Diagnosis not present

## 2019-07-05 DIAGNOSIS — B351 Tinea unguium: Secondary | ICD-10-CM | POA: Diagnosis not present

## 2019-07-05 DIAGNOSIS — M79674 Pain in right toe(s): Secondary | ICD-10-CM

## 2019-07-05 NOTE — Progress Notes (Signed)
This patient returns to my office for at risk foot care.  This patient requires this care by a professional since this patient will be at risk due to having diabetes.  Patient has painful callus both forefeet.  This patient is unable to cut nails and calluses  herself since the patient cannot reach her feet..These nails are painful walking and wearing shoes.  This patient presents for at risk foot care today.  General Appearance  Alert, conversant and in no acute stress.  Vascular  Dorsalis pedis and posterior tibial  pulses are palpable  bilaterally.  Capillary return is within normal limits  bilaterally. Temperature is within normal limits  bilaterally.  Neurologic  Senn-Weinstein monofilament wire test within normal limits  bilaterally. Muscle power within normal limits bilaterally.  Nails Thick disfigured discolored nails with subungual debris  from hallux to fifth toes bilaterally. No evidence of bacterial infection or drainage bilaterally.  Orthopedic  No limitations of motion  feet .  No crepitus or effusions noted.  No bony pathology or digital deformities noted. HAV  B/L.  Hammer toes 2-5 with plantar flexed second metatarsal head  B/L  Skin  normotropic skin with no porokeratosis noted bilaterally.  No signs of infections or ulcers noted.     Onychomycosis  Pain in right toes  Pain in left toes  Consent was obtained for treatment procedures.   Mechanical debridement of nails 1-5  bilaterally performed with a nail nipper.  Filed with dremel without incident. Debridement of porokeratosis with # 15 blade.   Return office visit  prn                    Told patient to return for periodic foot care and evaluation due to potential at risk complications.  To see Dr.  Posey Pronto if she desires to have foot surgery   Gardiner Barefoot DPM

## 2019-07-24 ENCOUNTER — Encounter: Payer: Self-pay | Admitting: Podiatry

## 2019-07-24 ENCOUNTER — Other Ambulatory Visit: Payer: Self-pay

## 2019-07-24 ENCOUNTER — Ambulatory Visit (INDEPENDENT_AMBULATORY_CARE_PROVIDER_SITE_OTHER): Payer: Medicare Other | Admitting: Podiatry

## 2019-07-24 DIAGNOSIS — M778 Other enthesopathies, not elsewhere classified: Secondary | ICD-10-CM

## 2019-07-24 DIAGNOSIS — E1142 Type 2 diabetes mellitus with diabetic polyneuropathy: Secondary | ICD-10-CM

## 2019-07-24 DIAGNOSIS — Q828 Other specified congenital malformations of skin: Secondary | ICD-10-CM | POA: Diagnosis not present

## 2019-07-24 NOTE — Progress Notes (Signed)
Subjective:  Patient ID: [name], female    DOB: May 05, 1934,  MRN: [id number]  Chief Complaint  Patient presents with  . Callouses    follow up porokeratosis right forefoot    85 y.o. female presents with the above complaint.  Patient presents with complaint of right submetatarsal 4 benign skin lesion with underlying associated capsulitis.  Patient states that this has been hurting for quite some time.  Patient was followed up with Dr. Prudence Davidson who had done debridement but seem to not help resolve.  Patient is interested in surgical intervention as well as discuss other conservative options as well.  She has not tried offloading it.  She has not tried any kind of orthotics management she denies any other acute complaints.   Review of Systems: Negative except as noted in the HPI. Denies N/V/F/Ch.  Past Medical History:  Diagnosis Date  . Allergy    hay fever  . Arthritis   . Cataract    resolved with surgery  . Chicken pox   . Colon polyp   . Diabetes mellitus 2008  . Generalized headaches    thinks caused by glaucoma  . Glaucoma   . Hypertension   . Right sided sciatica 07/03/2013  . Thyroid disease    hypothyroidism  . Urinary incontinence   . UTI (urinary tract infection)     Current Outpatient Medications:  .  ascorbic acid (VITAMIN C) 500 MG tablet, Take 1 tablet (500 mg total) by mouth daily., Disp:  , Rfl:  .  aspirin 81 MG tablet, Take 81 mg by mouth every Monday, Wednesday, and Friday. , Disp: , Rfl:  .  atenolol (TENORMIN) 50 MG tablet, TAKE 1 TABLET BY MOUTH  DAILY, Disp: 90 tablet, Rfl: 3 .  atorvastatin (LIPITOR) 20 MG tablet, Take 1 tablet (20 mg total) by mouth daily., Disp: 90 tablet, Rfl: 3 .  Blood Glucose Monitoring Suppl (ONE TOUCH ULTRA SYSTEM KIT) W/DEVICE KIT, 1 kit by Does not apply route once., Disp: 1 each, Rfl: 0 .  Cholecalciferol (VITAMIN D3) 25 MCG (1000 UT) CAPS, Take 1 capsule (1,000 Units total) by mouth daily., Disp: 30 capsule, Rfl:  .   cyanocobalamin (V-R VITAMIN B-12) 500 MCG tablet, Take 1 tablet (500 mcg total) by mouth daily., Disp: , Rfl:  .  Lancet Device MISC, One touch delica - Use as directed, Disp: 1 each, Rfl: 1 .  Lancets (ONETOUCH DELICA PLUS [id number]) MISC, CHECK BLOOD SUGAR 1-2 TIMES DAILY AS NEEDED, Disp: 100 each, Rfl: 3 .  Lancets Misc. (ACCU-CHEK SOFTCLIX LANCET DEV) KIT, Use at home to test blood sugars daily., Disp: 1 kit, Rfl: 0 .  levothyroxine (SYNTHROID) 112 MCG tablet, TAKE 1 TABLET BY MOUTH  DAILY, Disp: 90 tablet, Rfl: 3 .  losartan (COZAAR) 50 MG tablet, TAKE 1 TABLET BY MOUTH  DAILY, Disp: 90 tablet, Rfl: 3 .  montelukast (SINGULAIR) 10 MG tablet, TAKE 1 TABLET BY MOUTH  DAILY, Disp: 90 tablet, Rfl: 3 .  omeprazole (PRILOSEC) 40 MG capsule, Take 1 capsule (40 mg total) by mouth daily., Disp: 90 capsule, Rfl: 3 .  ONETOUCH ULTRA test strip, TEST AS DIRECTED ONCE DAILY, Disp: 100 strip, Rfl: 3 .  triamcinolone cream (KENALOG) 0.1 %, Apply 1 application topically 2 (two) times daily. Apply to AA., Disp: 45 g, Rfl: 1  Social History   Tobacco Use  Smoking Status Never Smoker  Smokeless Tobacco Never Used    Allergies  Allergen Reactions  . Align [  Acidophilus]     Worsened abd bloating, pressure  . Augmentin [Amoxicillin-Pot Clavulanate]     Possible, had itchy rash  . Cefprozil Swelling  . Metformin And Related Other (See Comments)    Severe stomach pains, muscle aches  . Timolol     Local reaction to eye drops   Objective:  There were no vitals filed for this visit. There is no height or weight on file to calculate BMI. Constitutional Well developed. Well nourished.  Vascular Dorsalis pedis pulses palpable bilaterally. Posterior tibial pulses palpable bilaterally. Capillary refill normal to all digits.  No cyanosis or clubbing noted. Pedal hair growth normal.  Neurologic Normal speech. Oriented to person, place, and time. Epicritic sensation to light touch grossly present  bilaterally.  Dermatologic Nails well groomed and normal in appearance. No open wounds. No skin lesions.  Orthopedic:  Pain on palpation to the right submetatarsal 4 porokeratosis.  Pain on palpation.  Plantarflexed fourth metatarsal noted.  Hyperkeratotic lesion noted submetatarsal 4.  Central nucleated core noted.   Radiographs: None Assessment:   1. Porokeratosis   2. Type 2 diabetes, controlled, with peripheral neuropathy (HCC)   3. Capsulitis of right foot    Plan:  Patient was evaluated and treated and all questions answered.  Right submetatarsal 4 porokeratosis with underlying capsulitis -I explained to the patient the etiology of porokeratosis and various treatment options were extensively discussed.  I believe the porokeratosis that led to underlying capsulitis and patient will benefit from a steroid injection.  Patient agrees with the plan would like to proceed with a steroid injection.  I briefly discussed with the patient surgical intervention that involves dorsiflexor osteotomy of the metatarsal head to take the pressure away.  I also discussed with the patient orthotics management as well. -Offloading pad was dispensed if they are beneficial will consider orthotics management versus surgical management. -Using chisel blade and a handle, the hyperkeratotic lesion was aggressively debrided down to healthy striated tissue.  No complications noted.  No pinpoint bleeding noted.  The excision of the nucleated core was performed. -A steroid injection was performed at right fourth metatarsophalangeal joint using 1% plain Lidocaine and 10 mg of Kenalog. This was well tolerated.   No follow-ups on file.

## 2019-08-17 ENCOUNTER — Encounter: Payer: Self-pay | Admitting: Internal Medicine

## 2019-08-17 ENCOUNTER — Other Ambulatory Visit: Payer: Self-pay

## 2019-08-17 ENCOUNTER — Ambulatory Visit (INDEPENDENT_AMBULATORY_CARE_PROVIDER_SITE_OTHER): Payer: Medicare Other | Admitting: Internal Medicine

## 2019-08-17 DIAGNOSIS — S46911A Strain of unspecified muscle, fascia and tendon at shoulder and upper arm level, right arm, initial encounter: Secondary | ICD-10-CM | POA: Insufficient documentation

## 2019-08-17 NOTE — Assessment & Plan Note (Signed)
No clear indication of rotator cuff injury Some tenderness over bursa--discussed ice Heat otherwise Tylenol regularly Could try 2 ibuprofen is pain is more severe  Orthopedist if not much improved in 2 weeks

## 2019-08-17 NOTE — Progress Notes (Signed)
Subjective:    Patient ID: [name], female    DOB: 19-Feb-1934, 85 y.o.   MRN: [id number]  HPI Here with husband due to arm pain This visit occurred during the SARS-CoV-2 public health emergency.  Safety protocols were in place, including screening questions prior to the visit, additional usage of staff PPE, and extensive cleaning of exam room while observing appropriate contact time as indicated for disinfecting solutions.   Started to fall ---grabbed freezer chest with both arms, but mainly stretched right arm to hold it She kept herself from falling but could feel it in her right shoulder to neck and arm since then  Points to anterior right shoulder--along trapezius to neck and down arm (to wrist) No pain at rest Able to lift arm--but has pain Is right handed--has limited activities  No Rx--no ice/heat or medications Just takes low dose ASA daily  Current Outpatient Medications on File Prior to Visit  Medication Sig Dispense Refill  . ascorbic acid (VITAMIN C) 500 MG tablet Take 1 tablet (500 mg total) by mouth daily.    Marland Kitchen aspirin 81 MG tablet Take 81 mg by mouth every Monday, Wednesday, and Friday.     Marland Kitchen atenolol (TENORMIN) 50 MG tablet TAKE 1 TABLET BY MOUTH  DAILY 90 tablet 3  . atorvastatin (LIPITOR) 20 MG tablet Take 1 tablet (20 mg total) by mouth daily. 90 tablet 3  . Blood Glucose Monitoring Suppl (ONE TOUCH ULTRA SYSTEM KIT) W/DEVICE KIT 1 kit by Does not apply route once. 1 each 0  . Cholecalciferol (VITAMIN D3) 25 MCG (1000 UT) CAPS Take 1 capsule (1,000 Units total) by mouth daily. 30 capsule   . cyanocobalamin (V-R VITAMIN B-12) 500 MCG tablet Take 1 tablet (500 mcg total) by mouth daily.    Elmore Guise Device MISC One touch delica - Use as directed 1 each 1  . Lancets (ONETOUCH DELICA PLUS [id number]) MISC CHECK BLOOD SUGAR 1-2 TIMES DAILY AS NEEDED 100 each 3  . Lancets Misc. (ACCU-CHEK SOFTCLIX LANCET DEV) KIT Use at home to test blood sugars daily. 1 kit 0  .  levothyroxine (SYNTHROID) 112 MCG tablet TAKE 1 TABLET BY MOUTH  DAILY 90 tablet 3  . losartan (COZAAR) 50 MG tablet TAKE 1 TABLET BY MOUTH  DAILY 90 tablet 3  . montelukast (SINGULAIR) 10 MG tablet TAKE 1 TABLET BY MOUTH  DAILY 90 tablet 3  . omeprazole (PRILOSEC) 40 MG capsule Take 1 capsule (40 mg total) by mouth daily. 90 capsule 3  . ONETOUCH ULTRA test strip TEST AS DIRECTED ONCE DAILY 100 strip 3  . triamcinolone cream (KENALOG) 0.1 % Apply 1 application topically 2 (two) times daily. Apply to AA. 45 g 1   No current facility-administered medications on file prior to visit.    Allergies  Allergen Reactions  . Align [Acidophilus]     Worsened abd bloating, pressure  . Augmentin [Amoxicillin-Pot Clavulanate]     Possible, had itchy rash  . Cefprozil Swelling  . Metformin And Related Other (See Comments)    Severe stomach pains, muscle aches  . Timolol     Local reaction to eye drops    Past Medical History:  Diagnosis Date  . Allergy    hay fever  . Arthritis   . Cataract    resolved with surgery  . Chicken pox   . Colon polyp   . Diabetes mellitus 2008  . Generalized headaches    thinks caused by glaucoma  .  Glaucoma   . Hypertension   . Right sided sciatica 07/03/2013  . Thyroid disease    hypothyroidism  . Urinary incontinence   . UTI (urinary tract infection)     Past Surgical History:  Procedure Laterality Date  . APPENDECTOMY  1998  . BUNIONECTOMY    . CHOLECYSTECTOMY    . COLONOSCOPY  2007   int hem, poor bowel prep, normal barium enema in following (Competiello)  . COLONOSCOPY WITH PROPOFOL N/A 07/22/2016   diverticulosis Allen Norris, Darren, MD)  . EYE SURGERY     R  eye-lid drop surgery  . TONSILLECTOMY      Family History  Problem Relation Age of Onset  . Heart disease Mother   . Asthma Mother   . Heart attack Mother 56       aneurysm ruptured by heart  . Heart disease Father   . Deep vein thrombosis Father   . Leukemia Brother   . Cancer  Brother   . Diabetes Sister   . Crohn's disease Brother   . Diabetes Brother   . Arthritis Other   . Diabetes Other   . Cancer Maternal Aunt        ovarian?  . Cancer Maternal Grandfather        colon  . Breast cancer Neg Hx     Social History   Socioeconomic History  . Marital status: Married    Spouse name: Not on file  . Number of children: Not on file  . Years of education: Not on file  . Highest education level: Not on file  Occupational History  . Not on file  Tobacco Use  . Smoking status: Never Smoker  . Smokeless tobacco: Never Used  Vaping Use  . Vaping Use: Never used  Substance and Sexual Activity  . Alcohol use: Not Currently    Alcohol/week: 0.0 standard drinks    Comment: occasional  . Drug use: No  . Sexual activity: Yes  Other Topics Concern  . Not on file  Social History Narrative   Lives in [place], from [place]. Daughter lives here. 4 daughters.   From [place]   Work - retired Data processing manager   Diet - regular   Exercise - bike daily at home   Social Determinants of Health   Financial Resource Strain: [place]   . Difficulty of Paying Living Expenses: Not hard at all  Food Insecurity: No Food Insecurity  . Worried About Charity fundraiser in the Last Year: Never true  . Ran Out of Food in the Last Year: Never true  Transportation Needs: No Transportation Needs  . Lack of Transportation (Medical): No  . Lack of Transportation (Non-Medical): No  Physical Activity: Insufficiently Active  . Days of Exercise per Week: 3 days  . Minutes of Exercise per Session: 30 min  Stress: No Stress Concern Present  . Feeling of Stress : Not at all  Social Connections:   . Frequency of Communication with Friends and Family:   . Frequency of Social Gatherings with Friends and Family:   . Attends Religious Services:   . Active Member of Clubs or Organizations:   . Attends Archivist Meetings:   Marland Kitchen Marital Status:   Intimate Partner Violence: Not At Risk  .  Fear of Current or Ex-Partner: No  . Emotionally Abused: No  . Physically Abused: No  . Sexually Abused: No   Review of Systems  Notes some weakness in arm Minor pain in  left shoulder only     Objective:   Physical Exam Neck:     Comments: Fairly normal ROM Tenderness along right trapezius Musculoskeletal:     Comments: No shoulder swelling Mild tenderness over subacromial bursa Active abduction to 45 degrees but passive 100 degrees Fairly normal internal rotation--and external rotation (but with pain) No crepitus  Neurological:     Comments: Mild weakness at right shoulder related to pain            Assessment & Plan:

## 2019-08-21 ENCOUNTER — Ambulatory Visit: Payer: Medicare Other | Admitting: Podiatry

## 2019-08-30 ENCOUNTER — Other Ambulatory Visit: Payer: Self-pay | Admitting: Family Medicine

## 2019-08-30 DIAGNOSIS — Z1231 Encounter for screening mammogram for malignant neoplasm of breast: Secondary | ICD-10-CM

## 2019-09-18 ENCOUNTER — Ambulatory Visit (INDEPENDENT_AMBULATORY_CARE_PROVIDER_SITE_OTHER): Payer: Medicare Other | Admitting: Family Medicine

## 2019-09-18 ENCOUNTER — Encounter: Payer: Self-pay | Admitting: Family Medicine

## 2019-09-18 ENCOUNTER — Other Ambulatory Visit: Payer: Self-pay

## 2019-09-18 VITALS — BP 126/74 | HR 63 | Temp 97.7°F | Ht 62.0 in | Wt 167.2 lb

## 2019-09-18 DIAGNOSIS — R252 Cramp and spasm: Secondary | ICD-10-CM | POA: Diagnosis not present

## 2019-09-18 DIAGNOSIS — H409 Unspecified glaucoma: Secondary | ICD-10-CM

## 2019-09-18 DIAGNOSIS — D492 Neoplasm of unspecified behavior of bone, soft tissue, and skin: Secondary | ICD-10-CM

## 2019-09-18 DIAGNOSIS — E1142 Type 2 diabetes mellitus with diabetic polyneuropathy: Secondary | ICD-10-CM | POA: Diagnosis not present

## 2019-09-18 LAB — POCT GLYCOSYLATED HEMOGLOBIN (HGB A1C): Hemoglobin A1C: 6.6 % — AB (ref 4.0–5.6)

## 2019-09-18 LAB — BASIC METABOLIC PANEL
BUN: 19 mg/dL (ref 6–23)
CO2: 32 mEq/L (ref 19–32)
Calcium: 9.4 mg/dL (ref 8.4–10.5)
Chloride: 104 mEq/L (ref 96–112)
Creatinine, Ser: 0.85 mg/dL (ref 0.40–1.20)
GFR: 63.93 mL/min (ref >60.00)
Glucose, Bld: 122 mg/dL — ABNORMAL HIGH (ref 70–99)
Potassium: 4.2 mEq/L (ref 3.5–5.1)
Sodium: 139 mEq/L (ref 135–145)

## 2019-09-18 LAB — CK: Total CK: 185 U/L — ABNORMAL HIGH (ref 7–177)

## 2019-09-18 LAB — MAGNESIUM: Magnesium: 2 mg/dL (ref 1.5–2.5)

## 2019-09-18 NOTE — Progress Notes (Signed)
This visit was conducted in person.  BP 126/74 (BP Location: Left Arm, Patient Position: Sitting, Cuff Size: Normal)   Pulse 63   Temp 97.7 F (36.5 C) (Temporal)   Ht _0  (1.575 m)   Wt 167 lb 3 oz (75.8 kg)   SpO2 96%   BMI 30.58 kg/m    CC: 3 mo f/u visit  Subjective:    Patient ID: [name], female    DOB: 16-Sep-1934, 85 y.o.   MRN: [id number]  HPI: [name] is a 85 y.o. female presenting on 09/18/2019 for Follow-up (Here for 3 mo f/u.  Pt accompanied by husband, [name]- temp 97.5.)   Husband recently underwent R inguinal hernia repair, recovering well.   Well woman exam through GYN with normal pap 06/2019, planning to stop paps for cervical cancer screening.   Known glaucoma sees [facility].  GI symptoms have improved with change in diet and daily omeprazole.   Ongoing leg cramping worse at night. She already uses loose fitting blankets, drinks plenty of water. Stretching legs, regularly uses exercise bicycle. Describes leg pains at night time, can feel heat emanating from thighs down to toes. Notes dry feeling to skin although she doesn't have actual dry skin.   Atorvastatin was started 06/2019, leg cramping worse since then.   2 wks ago noted some bumps to scalp initially tender then itchy  DM - does regularly check sugars 100-110 fasting in am, today 131. Compliant with antihyperglycemic regimen which includes: diet controlled. Denies low sugars or hypoglycemic symptoms. Some paresthesias to feet. Last diabetic eye exam 01/2019. Pneumovax: 2013, 2020. Prevnar: 2015. Glucometer brand: one-touch. DSME: completed remotely. Lab Results  Component Value Date   HGBA1C 6.6 (A) 09/18/2019   Diabetic Foot Exam - Simple   Simple Foot Form Diabetic Foot exam was performed with the following findings: Yes 09/18/2019  9:33 AM  Visual Inspection No deformities, no ulcerations, no other skin breakdown bilaterally: Yes Sensation Testing Intact to touch and  monofilament testing bilaterally: Yes Pulse Check Posterior Tibialis and Dorsalis pulse intact bilaterally: Yes Comments    Lab Results  Component Value Date   MICROALBUR 2.2 (H) 08/13/2015         Relevant past medical, surgical, family and social history reviewed and updated as indicated. Interim medical history since our last visit reviewed. Allergies and medications reviewed and updated. Outpatient Medications Prior to Visit  Medication Sig Dispense Refill  . ascorbic acid (VITAMIN C) 500 MG tablet Take 1 tablet (500 mg total) by mouth daily.    Marland Kitchen aspirin 81 MG tablet Take 81 mg by mouth every Monday, Wednesday, and Friday.     Marland Kitchen atenolol (TENORMIN) 50 MG tablet TAKE 1 TABLET BY MOUTH  DAILY 90 tablet 3  . atorvastatin (LIPITOR) 20 MG tablet Take 1 tablet (20 mg total) by mouth daily. 90 tablet 3  . Blood Glucose Monitoring Suppl (ONE TOUCH ULTRA SYSTEM KIT) W/DEVICE KIT 1 kit by Does not apply route once. 1 each 0  . Cholecalciferol (VITAMIN D3) 25 MCG (1000 UT) CAPS Take 1 capsule (1,000 Units total) by mouth daily. 30 capsule   . cyanocobalamin (V-R VITAMIN B-12) 500 MCG tablet Take 1 tablet (500 mcg total) by mouth daily.    Elmore Guise Device MISC One touch delica - Use as directed 1 each 1  . Lancets (ONETOUCH DELICA PLUS [id number]) MISC CHECK BLOOD SUGAR 1-2 TIMES DAILY AS NEEDED 100 each 3  . Lancets Misc. (  ACCU-CHEK SOFTCLIX LANCET DEV) KIT Use at home to test blood sugars daily. 1 kit 0  . levothyroxine (SYNTHROID) 112 MCG tablet TAKE 1 TABLET BY MOUTH  DAILY 90 tablet 3  . losartan (COZAAR) 50 MG tablet TAKE 1 TABLET BY MOUTH  DAILY 90 tablet 3  . montelukast (SINGULAIR) 10 MG tablet TAKE 1 TABLET BY MOUTH  DAILY 90 tablet 3  . omeprazole (PRILOSEC) 40 MG capsule Take 1 capsule (40 mg total) by mouth daily. 90 capsule 3  . ONETOUCH ULTRA test strip TEST AS DIRECTED ONCE DAILY 100 strip 3  . triamcinolone cream (KENALOG) 0.1 % Apply 1 application topically 2 (two) times  daily. Apply to AA. 45 g 1   No facility-administered medications prior to visit.     Per HPI unless specifically indicated in ROS section below Review of Systems Objective:  BP 126/74 (BP Location: Left Arm, Patient Position: Sitting, Cuff Size: Normal)   Pulse 63   Temp 97.7 F (36.5 C) (Temporal)   Ht _0  (1.575 m)   Wt 167 lb 3 oz (75.8 kg)   SpO2 96%   BMI 30.58 kg/m   Wt Readings from Last 3 Encounters:  09/18/19 167 lb 3 oz (75.8 kg)  08/17/19 170 lb (77.1 kg)  06/25/19 171 lb (77.6 kg)      Physical Exam Vitals and nursing note reviewed.  Constitutional:      General: She is not in acute distress.    Appearance: She is well-developed.  HENT:     Head: Normocephalic and atraumatic.  Eyes:     General: No scleral icterus.    Extraocular Movements: Extraocular movements intact.     Conjunctiva/sclera: Conjunctivae normal.     Pupils: Pupils are equal, round, and reactive to light.  Cardiovascular:     Rate and Rhythm: Normal rate and regular rhythm.     Pulses: Normal pulses.     Heart sounds: Normal heart sounds. No murmur heard.   Pulmonary:     Effort: Pulmonary effort is normal. No respiratory distress.     Breath sounds: Normal breath sounds. No wheezing, rhonchi or rales.  Musculoskeletal:     Cervical back: Normal range of motion and neck supple.     Right lower leg: No edema.     Left lower leg: No edema.     Comments: See HPI for foot exam if done  Lymphadenopathy:     Cervical: No cervical adenopathy.  Skin:    General: Skin is warm and dry.     Findings: No rash.     Comments: White scaly hyperkeratotic growth to left parietal scalp  Psychiatric:        Mood and Affect: Mood normal.        Behavior: Behavior normal.       Results for orders placed or performed in visit on 82/50/53  Basic metabolic panel  Result Value Ref Range   Sodium 139 135 - 145 mEq/L   Potassium 4.2 3.5 - 5.1 mEq/L   Chloride 104 96 - 112 mEq/L   CO2 32 19 - 32  mEq/L   Glucose, Bld 122 (H) 70 - 99 mg/dL   BUN 19 6 - 23 mg/dL   Creatinine, Ser 0.85 0.40 - 1.20 mg/dL   GFR 63.93 >60.00 mL/min   Calcium 9.4 8.4 - 10.5 mg/dL  CK  Result Value Ref Range   Total CK 185 (H) 7.0 - 177.0 U/L  Magnesium  Result Value Ref  Range   Magnesium 2.0 1.5 - 2.5 mg/dL  POCT glycosylated hemoglobin (Hb A1C)  Result Value Ref Range   Hemoglobin A1C 6.6 (A) 4.0 - 5.6 %   HbA1c POC (<> result, manual entry)     HbA1c, POC (prediabetic range)     HbA1c, POC (controlled diabetic range)     Lab Results  Component Value Date   VITAMINB12 595 06/15/2019    Assessment & Plan:  This visit occurred during the SARS-CoV-2 public health emergency.  Safety protocols were in place, including screening questions prior to the visit, additional usage of staff PPE, and extensive cleaning of exam room while observing appropriate contact time as indicated for disinfecting solutions.   Problem List Items Addressed This Visit    Type 2 diabetes, controlled, with peripheral neuropathy (HCC)    Chronic, stable. Continue diet control.       Relevant Orders   POCT glycosylated hemoglobin (Hb A1C) (Completed)   Skin growth    Scaly growth on left parietal scalp - will refer to derm for further evaluation.       Relevant Orders   Ambulatory referral to Dermatology   Leg cramps - Primary    Chronic, uncontrolled.  She did start lipitor 06/2019 and cramping may have worsened after this - will trial off lipitor for 2 weeks, check CPK, rec trial of co-enzyme q10. Update with effect.  Check Mg and other electrolytes today as well.  Consider medication for treatment such as vit B complex, vit E, benadryl, diltiazem or verapamil or gabapentin - some data for efficacy for nocturnal leg cramping.       Relevant Orders   Basic metabolic panel (Completed)   CK (Completed)   Magnesium (Completed)   Glaucoma    Appreciate ophtho care.          Meds ordered this encounter   Medications  . co-enzyme Q-10 50 MG capsule    Sig: Take 1 capsule (50 mg total) by mouth 2 (two) times daily.   Orders Placed This Encounter  Procedures  . Basic metabolic panel  . CK  . Magnesium  . Ambulatory referral to Dermatology    Referral Priority:   Routine    Referral Type:   Consultation    Referral Reason:   Specialty Services Required    Requested Specialty:   Dermatology    Number of Visits Requested:   1  . POCT glycosylated hemoglobin (Hb A1C)    Patient Instructions  Pare atorvastatina por 2 semanas para ver efecto en las piernas.  Laboratorios hoy  La remitiremos a dermatologo para Geophysical data processor cuero cabelludo  puede tratar medias de compresion por si hay insuficiencia venosa contribuyendo a dolor de piernas.    Follow up plan: Return in about 4 months (around 01/18/2020), or if symptoms worsen or fail to improve, for follow up visit.  Ria Bush, MD

## 2019-09-18 NOTE — Patient Instructions (Addendum)
Pare atorvastatina por 2 semanas para Education officer, community las piernas.  Laboratorios hoy  La remitiremos a dermatologo para Geophysical data processor cuero cabelludo  puede tratar medias de compresion por si hay insuficiencia venosa contribuyendo a dolor de piernas.

## 2019-09-19 ENCOUNTER — Ambulatory Visit
Admission: RE | Admit: 2019-09-19 | Discharge: 2019-09-19 | Disposition: A | Discharge status: Home / Self Care | Payer: Medicare Other | Source: Ambulatory Visit | Attending: Family Medicine | Admitting: Family Medicine

## 2019-09-19 ENCOUNTER — Encounter: Payer: Self-pay | Admitting: *Deleted

## 2019-09-19 DIAGNOSIS — Z1231 Encounter for screening mammogram for malignant neoplasm of breast: Secondary | ICD-10-CM | POA: Diagnosis not present

## 2019-09-21 LAB — HM MAMMOGRAPHY

## 2019-09-23 MED ORDER — CO-ENZYME Q-10 50 MG PO CAPS
50.0000 mg | ORAL_CAPSULE | Freq: Two times a day (BID) | ORAL | Status: DC
Start: 2019-09-23 — End: 2019-12-19

## 2019-09-23 NOTE — Assessment & Plan Note (Signed)
Scaly growth on left parietal scalp - will refer to derm for further evaluation.

## 2019-09-23 NOTE — Assessment & Plan Note (Signed)
Chronic, stable. Continue diet control.

## 2019-09-23 NOTE — Assessment & Plan Note (Addendum)
Appreciate ophtho care.  

## 2019-09-23 NOTE — Assessment & Plan Note (Addendum)
Chronic, uncontrolled.  She did start lipitor 06/2019 and cramping may have worsened after this - will trial off lipitor for 2 weeks, check CPK, rec trial of co-enzyme q10. Update with effect.  Check Mg and other electrolytes today as well.  Consider medication for treatment such as vit B complex, vit E, benadryl, diltiazem or verapamil or gabapentin - some data for efficacy for nocturnal leg cramping.

## 2019-09-24 ENCOUNTER — Encounter: Payer: Self-pay | Admitting: Family Medicine

## 2019-10-08 DIAGNOSIS — H401133 Primary open-angle glaucoma, bilateral, severe stage: Secondary | ICD-10-CM | POA: Diagnosis not present

## 2019-10-30 DIAGNOSIS — L578 Other skin changes due to chronic exposure to nonionizing radiation: Secondary | ICD-10-CM | POA: Diagnosis not present

## 2019-10-30 DIAGNOSIS — D485 Neoplasm of uncertain behavior of skin: Secondary | ICD-10-CM | POA: Diagnosis not present

## 2019-10-30 DIAGNOSIS — L821 Other seborrheic keratosis: Secondary | ICD-10-CM | POA: Diagnosis not present

## 2019-10-30 DIAGNOSIS — G548 Other nerve root and plexus disorders: Secondary | ICD-10-CM | POA: Diagnosis not present

## 2019-10-30 DIAGNOSIS — L570 Actinic keratosis: Secondary | ICD-10-CM | POA: Diagnosis not present

## 2019-10-30 DIAGNOSIS — D2271 Melanocytic nevi of right lower limb, including hip: Secondary | ICD-10-CM | POA: Diagnosis not present

## 2019-10-30 DIAGNOSIS — L718 Other rosacea: Secondary | ICD-10-CM | POA: Diagnosis not present

## 2019-11-05 ENCOUNTER — Telehealth: Payer: Self-pay

## 2019-11-05 NOTE — Telephone Encounter (Signed)
Noted  

## 2019-11-05 NOTE — Telephone Encounter (Signed)
Pt c/o unproductive cough, nasal and head congestion, sore throat, and earaches for 5 days,. Pt denies fever, loss of taste or smell, chest congestion, fatigue, SOB, HA, diarrhea, vomiting, chills, or muscle pain. Pt reports she has had sinus infections in the past and she thinks this is what she has now. She has been vaccinated for covid in Jan 2021, 2 doses [employer].  Pt reports she has taken OTC diabetic cough, chest congestion, sore throat syryp yesterday but has not taken any since. It is supposed to be taken Q4h. Pt has not used tylenol or NSAIDs. Pt is requesting OV. Advised pt no apts are available. Advised pt with these symptoms she will not be able to come into office and it would be best to go to UC to be assessed and then tested. Advised a msg would be sent to PCP to see if he would recommend a VV rather than UC and this office would f/u. Pt appreciative. Advised if any symptoms worsened or she developed any new symptoms to contact office. Pt verbalized understanding.

## 2019-11-05 NOTE — Telephone Encounter (Signed)
Please offer virtual visit with next available provider. Thank you.

## 2019-11-05 NOTE — Telephone Encounter (Signed)
Pt has VV on 9/22

## 2019-11-07 ENCOUNTER — Other Ambulatory Visit: Payer: Self-pay

## 2019-11-07 ENCOUNTER — Telehealth (INDEPENDENT_AMBULATORY_CARE_PROVIDER_SITE_OTHER): Payer: Medicare Other | Admitting: Family Medicine

## 2019-11-07 ENCOUNTER — Encounter: Payer: Self-pay | Admitting: Family Medicine

## 2019-11-07 DIAGNOSIS — Z20822 Contact with and (suspected) exposure to covid-19: Secondary | ICD-10-CM | POA: Diagnosis not present

## 2019-11-07 DIAGNOSIS — J209 Acute bronchitis, unspecified: Secondary | ICD-10-CM | POA: Diagnosis not present

## 2019-11-07 MED ORDER — AZITHROMYCIN 250 MG PO TABS: ORAL_TABLET | ORAL | 0 refills | Status: DC

## 2019-11-07 NOTE — Assessment & Plan Note (Addendum)
Anticipate developing acute bronchitis ?RSV given initial marked rhinorrhea vs COVID infection although she should have good protection with vaccines - I'm glad she has covid swap planned for this afternoon. Asked to let us know results.  Further supportive care reviewed.  Rx zpack to cover bacterial bronchitis.  Update if not improving with treatment.  [facility] for outpatient management.  Declines codeine cough syrup at this time

## 2019-11-07 NOTE — Progress Notes (Signed)
Virtual visit completed through MyChart, a video enabled telemedicine application. Due to national recommendations of social distancing due to COVID-19, a virtual visit is felt to be most appropriate for this patient at this time. Reviewed limitations, risks, security and privacy concerns of performing a virtual visit and the availability of in person appointments. I also reviewed that there may be a patient responsible charge related to this service. The patient agreed to proceed.   Patient location: home Provider location: [place] [facility], office Persons participating in this virtual visit: patient, provider, husband   If any vitals were documented, they were collected by patient at home unless specified below.    BP 128/77   Pulse (!) 58   Temp 97.6 F (36.4 C)   Ht $R'5\' 2"'Ck$  (1.575 m)   Wt 165 lb 4 oz (75 kg)   BMI 30.22 kg/m    CC: cough Subjective:    Patient ID: [name], female    DOB: Feb 12, 1935, 85 y.o.   MRN: [id number]  HPI: [name] is a 85 y.o. female presenting on 11/07/2019 for Cough (C/o productive cough with yellow/green mucous, sore throat and bilateral ear pain- worse in left.  Sxs started 11/01/19.  Tried Tylenol, OTC cough drops and cough syrup.   States BS this morning was 117.)   Several weeks of malaise, fatigue.  6d h/o dyspnea, marked rhinorrhea which then resolved but cough started. Having coughing fits with productive cough. ST, PNdrainage. Frontal sinuses started bothering her. Ongoing marked fatigue. Some sweats and nausea. Ongoing head congestion.   No fevers/chills, loss of taste/smell, abd pain, diarrhea. No body aches.  No sick contacts, no known COVID exposure.   Managing with tylenol, vicks, OTC cough drops, cough syrup (diabetic tussin)  Continues vit C and vit D and zinc.   Received COVID vaccine 02/2019 x2.   Planning to get COVID tested at [facility] afternoon.       Relevant past medical, surgical, family and social  history reviewed and updated as indicated. Interim medical history since our last visit reviewed. Allergies and medications reviewed and updated. Outpatient Medications Prior to Visit  Medication Sig Dispense Refill  . ascorbic acid (VITAMIN C) 500 MG tablet Take 1 tablet (500 mg total) by mouth daily.    Marland Kitchen aspirin 81 MG tablet Take 81 mg by mouth every Monday, Wednesday, and Friday.     Marland Kitchen atenolol (TENORMIN) 50 MG tablet TAKE 1 TABLET BY MOUTH  DAILY 90 tablet 3  . atorvastatin (LIPITOR) 20 MG tablet Take 1 tablet (20 mg total) by mouth daily. 90 tablet 3  . Blood Glucose Monitoring Suppl (ONE TOUCH ULTRA SYSTEM KIT) W/DEVICE KIT 1 kit by Does not apply route once. 1 each 0  . Cholecalciferol (VITAMIN D3) 25 MCG (1000 UT) CAPS Take 1 capsule (1,000 Units total) by mouth daily. 30 capsule   . co-enzyme Q-10 50 MG capsule Take 1 capsule (50 mg total) by mouth 2 (two) times daily.    . cyanocobalamin (V-R VITAMIN B-12) 500 MCG tablet Take 1 tablet (500 mcg total) by mouth daily.    Elmore Guise Device MISC One touch delica - Use as directed 1 each 1  . Lancets (ONETOUCH DELICA PLUS [id number]) MISC CHECK BLOOD SUGAR 1-2 TIMES DAILY AS NEEDED 100 each 3  . Lancets Misc. (ACCU-CHEK SOFTCLIX LANCET DEV) KIT Use at home to test blood sugars daily. 1 kit 0  . levothyroxine (SYNTHROID) 112 MCG tablet TAKE 1  TABLET BY MOUTH  DAILY 90 tablet 3  . losartan (COZAAR) 50 MG tablet TAKE 1 TABLET BY MOUTH  DAILY 90 tablet 3  . montelukast (SINGULAIR) 10 MG tablet TAKE 1 TABLET BY MOUTH  DAILY 90 tablet 3  . omeprazole (PRILOSEC) 40 MG capsule Take 1 capsule (40 mg total) by mouth daily. 90 capsule 3  . ONETOUCH ULTRA test strip TEST AS DIRECTED ONCE DAILY 100 strip 3  . triamcinolone cream (KENALOG) 0.1 % Apply 1 application topically 2 (two) times daily. Apply to AA. 45 g 1   No facility-administered medications prior to visit.     Per HPI unless specifically indicated in ROS section below Review of  Systems Objective:  BP 128/77   Pulse (!) 58   Temp 97.6 F (36.4 C)   Ht 5' 2" (1.575 m)   Wt 165 lb 4 oz (75 kg)   BMI 30.22 kg/m   Wt Readings from Last 3 Encounters:  11/07/19 165 lb 4 oz (75 kg)  09/18/19 167 lb 3 oz (75.8 kg)  08/17/19 170 lb (77.1 kg)       Physical exam: Gen: alert, NAD, not ill appearing Pulm: speaks in complete sentences without increased work of breathing Psych: normal mood, normal thought content      Results for orders placed or performed in visit on 09/24/19  HM MAMMOGRAPHY  Result Value Ref Range   HM Mammogram 0-4 Bi-Rad 0-4 Bi-Rad, Self Reported Normal   Assessment & Plan:   Problem List Items Addressed This Visit    Acute bronchitis    Anticipate developing acute bronchitis ?RSV given initial marked rhinorrhea vs COVID infection although she should have good protection with vaccines - I'm glad she has covid swap planned for this afternoon. Asked to let us know results.  Further supportive care reviewed.  Rx zpack to cover bacterial bronchitis.  Update if not improving with treatment.  [facility] for outpatient management.  Declines codeine cough syrup at this time           Meds ordered this encounter  Medications  . azithromycin (ZITHROMAX) 250 MG tablet    Sig: Take two tablets on day one followed by one tablet on days 2-5    Dispense:  6 each    Refill:  0   No orders of the defined types were placed in this encounter.   I discussed the assessment and treatment plan with the patient. The patient was provided an opportunity to ask questions and all were answered. The patient agreed with the plan and demonstrated an understanding of the instructions. The patient was advised to call back or seek an in-person evaluation if the symptoms worsen or if the condition fails to improve as anticipated.  Follow up plan: No follow-ups on file.  Ria Bush, MD

## 2019-11-13 ENCOUNTER — Other Ambulatory Visit: Payer: Self-pay

## 2019-11-13 ENCOUNTER — Encounter: Payer: Self-pay | Admitting: Family Medicine

## 2019-11-13 ENCOUNTER — Telehealth: Payer: Self-pay

## 2019-11-13 ENCOUNTER — Ambulatory Visit (INDEPENDENT_AMBULATORY_CARE_PROVIDER_SITE_OTHER)
Admission: RE | Admit: 2019-11-13 | Discharge: 2019-11-13 | Disposition: A | Discharge status: Home / Self Care | Payer: Medicare Other | Source: Ambulatory Visit | Attending: Family Medicine | Admitting: Family Medicine

## 2019-11-13 ENCOUNTER — Ambulatory Visit (INDEPENDENT_AMBULATORY_CARE_PROVIDER_SITE_OTHER): Payer: Medicare Other | Admitting: Family Medicine

## 2019-11-13 VITALS — BP 132/70 | HR 65 | Temp 97.7°F | Ht 62.0 in | Wt 167.5 lb

## 2019-11-13 DIAGNOSIS — R5383 Other fatigue: Secondary | ICD-10-CM | POA: Diagnosis not present

## 2019-11-13 DIAGNOSIS — R0989 Other specified symptoms and signs involving the circulatory and respiratory systems: Secondary | ICD-10-CM

## 2019-11-13 DIAGNOSIS — G478 Other sleep disorders: Secondary | ICD-10-CM | POA: Diagnosis not present

## 2019-11-13 DIAGNOSIS — R05 Cough: Secondary | ICD-10-CM | POA: Diagnosis not present

## 2019-11-13 DIAGNOSIS — R55 Syncope and collapse: Secondary | ICD-10-CM

## 2019-11-13 DIAGNOSIS — R0981 Nasal congestion: Secondary | ICD-10-CM | POA: Diagnosis not present

## 2019-11-13 DIAGNOSIS — J209 Acute bronchitis, unspecified: Secondary | ICD-10-CM | POA: Diagnosis not present

## 2019-11-13 DIAGNOSIS — R053 Chronic cough: Secondary | ICD-10-CM

## 2019-11-13 MED ORDER — PREDNISONE 20 MG PO TABS: ORAL_TABLET | ORAL | 0 refills | Status: DC

## 2019-11-13 NOTE — Telephone Encounter (Signed)
[facility] - Client TELEPHONE ADVICE RECORD [facility] Patient Name: [name] Gender: Female DOB: 25-Mar-1934 Age: 85 Y 9 M 15 D Return Phone Number: [phone] (Primary) Address: City/State/Zip: [place] [address] Client [facility] - Client Client Site [facility] - Day Physician Ria Bush - MD Contact Type Call Who Is Calling Patient / Member / Family / Caregiver Call Type Triage / Clinical Relationship To Patient Self Return Phone Number [phone] (Primary) Chief Complaint BREATHING - shortness of breath or sounds breathless Reason for Call Symptomatic / Request for Health Information Initial Comment Seen on 22 for sore throat, still sore and is fatigue, wakes in middle of night having problems [name] 1934-04-12 [phone]. is getting dizzy very often, has fallen several times Translation No Nurse Assessment Nurse: Claiborne Billings, RN, Maudie Mercury Date/Time (Eastern Time): 11/12/2019 3:49:52 PM Confirm and document reason for call. If symptomatic, describe symptoms. ---Caller states she had dizziness and falls for several months. States she was seen on 9/22 for sore throat, was given an antibiotic but does not feel any better. States she feels weak, tired, still has sore throat. States overall her sxs have been worse since the visit on 9/22. States she also wakes up in the middle of the night feeling short of breath. Does the patient have any new or worsening symptoms? ---Yes Will a triage be completed? ---Yes Related visit to physician within the last 2 weeks? ---Yes Does the PT have any chronic conditions? (i.e. diabetes, asthma, this includes High risk factors for pregnancy, etc.) ---Yes List chronic conditions. ---HTN, Thyroid disease Is this a behavioral health or substance abuse call? ---No Guidelines Guideline Title Affirmed Question Affirmed Notes Nurse Date/Time  Eilene Ghazi Time) Dizziness - Lightheadedness Difficulty breathing Suzette Battiest 11/12/2019 3:57:16 PM Disp. Time Eilene Ghazi Time) Disposition Final User 11/12/2019 3:43:24 PM Send to Urgent Queue Ilona Sorrel 11/12/2019 3:58:57 PM Go to ED Now Yes Claiborne Billings, RN, Kim PLEASE NOTE: All timestamps contained within this report are represented as [place] Standard Time. CONFIDENTIALTY NOTICE: This fax transmission is intended only for the addressee. It contains information that is legally privileged, confidential or otherwise protected from use or disclosure. If you are not the intended recipient, you are strictly prohibited from reviewing, disclosing, copying using or disseminating any of this information or taking any action in reliance on or regarding this information. If you have received this fax in error, please notify us immediately by telephone so that we can arrange for its return to [place]. Phone: [phone], Toll-Free: [phone], Fax: [phone] Page: 2 of 2 Call Id: [id number] [place] Disagree/Comply Comply Caller Understands Yes PreDisposition InappropriateToAsk Care Advice Given Per Guideline GO TO ED NOW: * You need to be seen in the Emergency Department. * Go to the ED at ___________ [place] now. Drive carefully. NOTE TO TRIAGER - DRIVING: * Another adult should drive. BRING MEDICINES: * Bring a list of your current medicines when you go to the Emergency Department (ER). CARE ADVICE given per Dizziness (Adult) guideline. Comments User: Suezanne Jacquet, RN Date/Time (Eastern Time): 11/12/2019 4:13:10 PM Caller initially stated she was not going to go to the ER, she was just calling to get a sooner appt with MD for the fatigue she has been experiencing and to get her flu shot. Caller advised that recommendation due to her dizziness, weakness, falls, shortness of breath, that she be seen in ED. She refused so nurse called backline and office number;  held > 5 minutes and was unable to  speak with anyone. Caller was informed nurse was unable to reach anyone in the office and the recommendation is that she be seen in ER. Caller verbalized understanding. Referrals GO TO FACILITY UNDECIDED

## 2019-11-13 NOTE — Telephone Encounter (Signed)
I was unable to speak with pt; I spoke with [name] (DPR signed);pt had virtual appt on 11/07/19; pt took abx; S/T is better but still there. According to pts husband pt SOB is no worse than usual. Access nurse note has pt feels weak,tired,SOB and dizziness and pt has fallen several times. pts husband said pt has been having these symptoms for long time. pts husband wants appt with [name]. [name] said pt [facility]. [name] said pt can have in office appt today at 4:30 with [name]. Pt is to come to [facility] at 4:20 call [phone] chose option 1 and let them know that pt is at back door and they will notify [name] CMA (who is aware of this) and [name] CMA will let pt in the back door. pts husband was advised for pt and if someone else comes with pt to wear mask. If condition changes or worsens [name] will cb to [facility] and UC & ED precautions also given; [name] voiced understanding. FYI to [name].

## 2019-11-13 NOTE — Patient Instructions (Addendum)
Xray today  Para inflamacion de pulmones y sinuses - use prednisona oral.  La voy a remitir a doctor de sueo para evaluar sleep apnea.  haremos evaluacion para desmayo (sincope)   Sncope Syncope  El sncope hace referencia a una afeccin en la cual una persona pierde la conciencia temporalmente. Tambin se lo Firefighter. La causa del sncope es una disminucin sbita del flujo de sangre al cerebro. Aunque la [employer] de las causas no implican un peligro, el sncope puede ser un signo de un problema mdico grave. El mdico puede hacerle estudios para encontrar el motivo por el cual tiene un sncope. Los signos de que alguien est por [employer] incluyen lo siguiente:  [employer] o aturdido.  Sentir nuseas.  Ver todo blanco o negro en el campo de visin.  Tener la piel fra y hmeda. Ante un desmayo, obtenga ayuda mdica de inmediato. Comunquese con el servicio de emergencias de su localidad (911 en los [place]). No conduzca por sus propios medios [facility] hospital. Siga estas indicaciones en su casa: Est atento a cualquier cambio en los sntomas. Para mantener su seguridad y ayudar a Public house manager sus sntomas, tome estas medidas: Estilo de vida  No conduzca vehculos, no use maquinarias ni practique deportes hasta que el mdico lo autorice.  No beba alcohol.  No consuma ningn producto que contenga nicotina o tabaco, como cigarrillos y Psychologist, sport and exercise. Si necesita ayuda para dejar de fumar, consulte al mdico.  Beba suficiente lquido como para mantener la orina de color amarillo plido. Instrucciones generales  [employer] de venta libre y los recetados solamente como se lo haya indicado el mdico.  Si est tomando medicamentos para la presin arterial o para el corazn, pngase de pie lentamente, tmese algunos minutos para permanecer sentado y luego prese. Esto puede reducir el mareo o el desvanecimiento.  Pdale a alguien que se  quede con usted hasta que se sienta estable.  Si comienza a sentir que [employer], recustese de inmediato y alce (eleve) los pies por encima del nivel del corazn. Respire profundamente y de Henning continua. Espere hasta que los sntomas hayan desaparecido.  Concurra a todas las visitas de control como se lo haya indicado el mdico. Esto es importante. Solicite ayuda inmediatamente si:  Tiene un dolor de cabeza intenso.  Se desmaya una o varias veces.  Tiene dolor en el pecho, el abdomen o la espalda.  Tiene latidos cardacos irregulares o muy rpidos (palpitaciones).  Siente dolor al respirar.  Sangra por la boca o el recto, o sus heces son de color negro o aspecto alquitranado.  Tiene una convulsin.  Se siente confundido.  Tiene dificultad para caminar.  Siente debilidad intensa.  Tiene problemas visuales. Estos sntomas pueden representar un problema grave que constituye Engineer, maintenance (IT). No espere hasta que los sntomas desaparezcan. Solicite atencin mdica de inmediato. Comunquese con el servicio de emergencias de su localidad (911 en los [place]). No conduzca por sus propios medios [facility] hospital. Resumen  El sncope hace referencia a una afeccin en la cual una persona pierde la conciencia temporalmente. La causa del sncope es una disminucin sbita del flujo de sangre al cerebro.  Los signos de que puede estar por [employer], sensacin de desvanecimiento, nuseas, cambios repentinos en la visin o piel fra y hmeda.  Aunque la [employer] de las causas no implican un peligro, el sncope puede ser un signo de un problema mdico grave. Ante un desmayo, [place]  mdica de inmediato. Esta informacin no tiene Marine scientist el consejo del mdico. Asegrese de hacerle al mdico cualquier pregunta que tenga. Document Revised: 05/14/2017 Document Reviewed: 03/11/2017 Elsevier Patient Education  2020 [employer].

## 2019-11-13 NOTE — Progress Notes (Addendum)
This visit was conducted in person.  BP 132/70 (BP Location: Left Arm, Patient Position: Sitting, Cuff Size: Normal)   Pulse 65   Temp 97.7 F (36.5 C) (Temporal)   Ht 5' 2"  (1.575 m)   Wt 167 lb 8 oz (76 kg)   SpO2 94%   BMI 30.64 kg/m    CC: ongoing fatigue, ST Subjective:    Patient ID: [name], female    DOB: 1934-07-01, 85 y.o.   MRN: [id number]  HPI: [name] is a 85 y.o. female presenting on 11/13/2019 for Sore Throat (C/o sore throat, SOB and fatigue.  Pt accompanied by husband, [name]- temp 97.9.)   See prior note for details. Seen for virtual visit 11/07/2019, dx acute bronchitis treated with zpack. She did initially feel better while on antibiotic, then symptoms recurred and may be progressing.   Ongoing fatigue, ongoing ST especially bad this afternoon pain lasted 5 minute. Other times no ST (currently). Ongoing fatigue. Waking up with frontal headache. Managed with scalp massage. Some facial pressure and sleepy eyes.   No fevers/chills.  Cough has continued improving, but endorses ongoing chronic cough presenting as intermittent fits of cough with prolonged normal periods in between. Endorses chronic episodes of dyspnea as well.  Endorses multiple episodes of PNDyspnea.  Snoring. No witnessed apneic episodes. Non-restorative poor sleeping. Several daytime naps due to ongoing fatigue.   Notes intermittent unsteadiness with ambulation. Has had several falls due to this. Last fall occurred 3 wks ago - several over the past year.  She self stopped lipitor - thinks this was causing dizziness.  Falls not due to tripping. She describes episodes of vision going dark then finds herself on the floor. No significant vertigo.   Remote h/o syncope s/p reassuring workup.   Also endorses frequent night sweats and hot flashes without swollen glands, unexpected weight loss, fever/chill.      Relevant past medical, surgical, family and social history reviewed and  updated as indicated. Interim medical history since our last visit reviewed. Allergies and medications reviewed and updated. Outpatient Medications Prior to Visit  Medication Sig Dispense Refill  . ascorbic acid (VITAMIN C) 500 MG tablet Take 1 tablet (500 mg total) by mouth daily.    Marland Kitchen aspirin 81 MG tablet Take 81 mg by mouth every Monday, Wednesday, and Friday.     Marland Kitchen atenolol (TENORMIN) 50 MG tablet TAKE 1 TABLET BY MOUTH  DAILY 90 tablet 3  . Blood Glucose Monitoring Suppl (ONE TOUCH ULTRA SYSTEM KIT) W/DEVICE KIT 1 kit by Does not apply route once. 1 each 0  . Cholecalciferol (VITAMIN D3) 25 MCG (1000 UT) CAPS Take 1 capsule (1,000 Units total) by mouth daily. 30 capsule   . co-enzyme Q-10 50 MG capsule Take 1 capsule (50 mg total) by mouth 2 (two) times daily.    . cyanocobalamin (V-R VITAMIN B-12) 500 MCG tablet Take 1 tablet (500 mcg total) by mouth daily.    Elmore Guise Device MISC One touch delica - Use as directed 1 each 1  . Lancets (ONETOUCH DELICA PLUS [id number]) MISC CHECK BLOOD SUGAR 1-2 TIMES DAILY AS NEEDED 100 each 3  . Lancets Misc. (ACCU-CHEK SOFTCLIX LANCET DEV) KIT Use at home to test blood sugars daily. 1 kit 0  . levothyroxine (SYNTHROID) 112 MCG tablet TAKE 1 TABLET BY MOUTH  DAILY 90 tablet 3  . losartan (COZAAR) 50 MG tablet TAKE 1 TABLET BY MOUTH  DAILY 90 tablet 3  . montelukast (  SINGULAIR) 10 MG tablet TAKE 1 TABLET BY MOUTH  DAILY 90 tablet 3  . omeprazole (PRILOSEC) 40 MG capsule Take 1 capsule (40 mg total) by mouth daily. 90 capsule 3  . ONETOUCH ULTRA test strip TEST AS DIRECTED ONCE DAILY 100 strip 3  . triamcinolone cream (KENALOG) 0.1 % Apply 1 application topically 2 (two) times daily. Apply to AA. 45 g 1  . atorvastatin (LIPITOR) 20 MG tablet Take 1 tablet (20 mg total) by mouth daily. (Patient not taking: Reported on 11/13/2019) 90 tablet 3  . azithromycin (ZITHROMAX) 250 MG tablet Take two tablets on day one followed by one tablet on days 2-5 6 each 0   No  facility-administered medications prior to visit.     Per HPI unless specifically indicated in ROS section below Review of Systems Objective:  BP 132/70 (BP Location: Left Arm, Patient Position: Sitting, Cuff Size: Normal)   Pulse 65   Temp 97.7 F (36.5 C) (Temporal)   Ht 5' 2"  (1.575 m)   Wt 167 lb 8 oz (76 kg)   SpO2 94%   BMI 30.64 kg/m   Wt Readings from Last 3 Encounters:  11/13/19 167 lb 8 oz (76 kg)  11/07/19 165 lb 4 oz (75 kg)  09/18/19 167 lb 3 oz (75.8 kg)      Physical Exam Vitals and nursing note reviewed.  Constitutional:      General: She is not in acute distress.    Appearance: Normal appearance. She is well-developed. She is not ill-appearing.  HENT:     Head: Normocephalic and atraumatic.     Right Ear: Hearing, tympanic membrane, ear canal and external ear normal.     Left Ear: Hearing, tympanic membrane, ear canal and external ear normal.     Nose: No mucosal edema.     Right Sinus: Maxillary sinus tenderness present. No frontal sinus tenderness.     Left Sinus: Maxillary sinus tenderness present. No frontal sinus tenderness.     Comments: Discomfort to palpation maxillary sinuses    Mouth/Throat:     Mouth: Mucous membranes are moist.     Pharynx: Oropharynx is clear. Uvula midline. No pharyngeal swelling, oropharyngeal exudate, posterior oropharyngeal erythema or uvula swelling.     Tonsils: No tonsillar exudate or tonsillar abscesses.  Eyes:     General: No scleral icterus.    Extraocular Movements: Extraocular movements intact.     Conjunctiva/sclera: Conjunctivae normal.     Pupils: Pupils are equal, round, and reactive to light.  Neck:     Thyroid: No thyroid mass or thyromegaly.     Vascular: Carotid bruit (left sided) present.  Cardiovascular:     Rate and Rhythm: Normal rate and regular rhythm.     Pulses: Normal pulses.     Heart sounds: Normal heart sounds. No murmur heard.   Pulmonary:     Effort: Pulmonary effort is normal. No  respiratory distress.     Breath sounds: Normal breath sounds. No wheezing, rhonchi or rales.     Comments: Lungs clear Musculoskeletal:     Cervical back: Normal range of motion and neck supple.     Right lower leg: No edema.     Left lower leg: No edema.  Lymphadenopathy:     Cervical: No cervical adenopathy.  Skin:    General: Skin is warm and dry.     Findings: No rash.  Neurological:     General: No focal deficit present.     Cranial  Nerves: Cranial nerves are intact.     Sensory: Sensation is intact.     Motor: Motor function is intact.     Coordination: Coordination is intact. Romberg sign negative. Finger-Nose-Finger Test normal.     Gait: Gait is intact.     Comments:  CN 2-12 intact FTN intact EOMI No pronator drift  Psychiatric:        Mood and Affect: Mood normal.        Behavior: Behavior normal.       Results for orders placed or performed in visit on 09/24/19  HM MAMMOGRAPHY  Result Value Ref Range   HM Mammogram 0-4 Bi-Rad 0-4 Bi-Rad, Self Reported Normal   Assessment & Plan:  This visit occurred during the SARS-CoV-2 public health emergency.  Safety protocols were in place, including screening questions prior to the visit, additional usage of staff PPE, and extensive cleaning of exam room while observing appropriate contact time as indicated for disinfecting solutions.  Over 45 minutes were spent face-to-face with the patient during this encounter or in coordination of care, reviewing past records, time spent documenting, lab results or radiology, or educating the patient or family.  Problem List Items Addressed This Visit    Syncope and collapse    Difficult to pinpoint exactly what happens during her falls - but it seems to be due to actual syncope. Reassuringly nonfocal neurological exam today. Will start syncope evaluation with carotid US, echocardiogram, head CT. Recent reassuring labs from this year reviewed. Update EKG at next visit.       Relevant  Orders   VAS US CAROTID   ECHOCARDIOGRAM COMPLETE   CT Head Wo Contrast   Non-restorative sleep    Describes non restorative sleep, daytime somnolence, snoring, and PNdyspnea. Will refer to pulm for OSA evaluation.       Relevant Orders   Ambulatory referral to Pulmonology   Nasal sinus congestion - Primary    Nasal sinus congestion with maxillary discomfort but without fever, purulent nasal discharge - anticipate sinus inflammation without true bacterial infection. Recommend prednisone course to help residual sinus and lung inflammation and see effect on chronic cough. Reviewed prednisone precautions especially in diabetic. Update with effect.       Chronic cough    Ongoing intermittent flares of coughing fits over the last several years, acutely worse after recent bronchitis earlier in the month. Rx short prednisone course.  Doubt GERD related as she's already on PPI without much improvement. Doubt allergic rhinitis related as she's already taking singulair as well.  Could consider pulm eval.       Relevant Orders   DG Chest 2 View    Other Visit Diagnoses    Left carotid bruit       Relevant Orders   VAS US CAROTID       Meds ordered this encounter  Medications  . predniSONE (DELTASONE) 20 MG tablet    Sig: Take two tablets daily for 3 days followed by one tablet daily for 3 days    Dispense:  9 tablet    Refill:  0   Orders Placed This Encounter  Procedures  . DG Chest 2 View    Standing Status:   Future    Number of Occurrences:   1    Standing Expiration Date:   11/12/2020    Order Specific Question:   Reason for Exam (SYMPTOM  OR DIAGNOSIS REQUIRED)    Answer:   cough, fatigue    Order  Specific Question:   Preferred imaging location?    Answer:   [facility]  . CT Head Wo Contrast    Standing Status:   Future    Standing Expiration Date:   11/13/2020    Order Specific Question:   Preferred imaging location?    Answer:   [facility]  .  Ambulatory referral to Pulmonology    Referral Priority:   Routine    Referral Type:   Consultation    Referral Reason:   Specialty Services Required    Requested Specialty:   Pulmonary Disease    Number of Visits Requested:   1  . ECHOCARDIOGRAM COMPLETE    Standing Status:   Future    Standing Expiration Date:   11/13/2020    Order Specific Question:   Where should this test be performed    Answer:   [facility]    Order Specific Question:   Perflutren DEFINITY (image enhancing agent) should be administered unless hypersensitivity or allergy exist    Answer:   Administer Perflutren    Order Specific Question:   Is a special reader required? (athlete or structural heart)    Answer:   No    Order Specific Question:   Does this study need to be read by the Structural team/Level 3 readers?    Answer:   No    Order Specific Question:   Reason for exam-Echo    Answer:   Syncope  780.2 / R55    Patient instructions: Xray today  Para inflamacion de pulmones y sinuses - use prednisona oral.  La voy a remitir a doctor de sueo para evaluar sleep apnea.  haremos evaluacion para desmayo (sincope)   Follow up plan: Return if symptoms worsen or fail to improve.  Ria Bush, MD

## 2019-11-14 ENCOUNTER — Encounter: Payer: Self-pay | Admitting: Family Medicine

## 2019-11-14 DIAGNOSIS — L719 Rosacea, unspecified: Secondary | ICD-10-CM | POA: Insufficient documentation

## 2019-11-14 DIAGNOSIS — R0981 Nasal congestion: Secondary | ICD-10-CM | POA: Insufficient documentation

## 2019-11-14 DIAGNOSIS — G478 Other sleep disorders: Secondary | ICD-10-CM | POA: Insufficient documentation

## 2019-11-14 DIAGNOSIS — R55 Syncope and collapse: Secondary | ICD-10-CM | POA: Insufficient documentation

## 2019-11-14 NOTE — Assessment & Plan Note (Signed)
Ongoing intermittent flares of coughing fits over the last several years, acutely worse after recent bronchitis earlier in the month. Rx short prednisone course.  Doubt GERD related as she's already on PPI without much improvement. Doubt allergic rhinitis related as she's already taking singulair as well.  Could consider pulm eval.

## 2019-11-14 NOTE — Assessment & Plan Note (Addendum)
Difficult to pinpoint exactly what happens during her falls - but it seems to be due to actual syncope. Reassuringly nonfocal neurological exam today. Will start syncope evaluation with carotid US, echocardiogram, head CT. Recent reassuring labs from this year reviewed. Update EKG at next visit.

## 2019-11-14 NOTE — Assessment & Plan Note (Signed)
Nasal sinus congestion with maxillary discomfort but without fever, purulent nasal discharge - anticipate sinus inflammation without true bacterial infection. Recommend prednisone course to help residual sinus and lung inflammation and see effect on chronic cough. Reviewed prednisone precautions especially in diabetic. Update with effect.

## 2019-11-14 NOTE — Assessment & Plan Note (Signed)
Describes non restorative sleep, daytime somnolence, snoring, and PNdyspnea. Will refer to pulm for OSA evaluation.

## 2019-11-16 ENCOUNTER — Ambulatory Visit: Payer: Medicare Other | Admitting: Family Medicine

## 2019-11-28 ENCOUNTER — Other Ambulatory Visit: Payer: Self-pay

## 2019-11-28 ENCOUNTER — Ambulatory Visit
Admission: RE | Admit: 2019-11-28 | Discharge: 2019-11-28 | Disposition: A | Discharge status: Home / Self Care | Payer: Medicare Other | Source: Ambulatory Visit | Attending: Family Medicine | Admitting: Family Medicine

## 2019-11-28 DIAGNOSIS — R55 Syncope and collapse: Secondary | ICD-10-CM | POA: Diagnosis not present

## 2019-11-28 DIAGNOSIS — R42 Dizziness and giddiness: Secondary | ICD-10-CM | POA: Diagnosis not present

## 2019-11-28 DIAGNOSIS — R519 Headache, unspecified: Secondary | ICD-10-CM | POA: Diagnosis not present

## 2019-11-29 ENCOUNTER — Ambulatory Visit (INDEPENDENT_AMBULATORY_CARE_PROVIDER_SITE_OTHER): Payer: Medicare Other

## 2019-11-29 DIAGNOSIS — Z23 Encounter for immunization: Secondary | ICD-10-CM

## 2019-12-04 ENCOUNTER — Other Ambulatory Visit: Payer: Self-pay

## 2019-12-04 ENCOUNTER — Ambulatory Visit (INDEPENDENT_AMBULATORY_CARE_PROVIDER_SITE_OTHER): Payer: Medicare Other

## 2019-12-04 DIAGNOSIS — R55 Syncope and collapse: Secondary | ICD-10-CM | POA: Diagnosis not present

## 2019-12-04 DIAGNOSIS — R0989 Other specified symptoms and signs involving the circulatory and respiratory systems: Secondary | ICD-10-CM | POA: Diagnosis not present

## 2019-12-04 LAB — ECHOCARDIOGRAM COMPLETE
AR max vel: 2.54 cm2
AV Area VTI: 2.62 cm2
AV Area mean vel: 2.64 cm2
AV Mean grad: 5 mmHg
AV Peak grad: 8.4 mmHg
Ao pk vel: 1.45 m/s
Area-P 1/2: 2.85 cm2
Calc EF: 53.3 %
S' Lateral: 2.3 cm
Single Plane A2C EF: 52.4 %
Single Plane A4C EF: 53.7 %

## 2019-12-07 ENCOUNTER — Telehealth: Payer: Self-pay | Admitting: Family Medicine

## 2019-12-07 ENCOUNTER — Other Ambulatory Visit: Payer: Self-pay | Admitting: Family Medicine

## 2019-12-07 DIAGNOSIS — R053 Chronic cough: Secondary | ICD-10-CM

## 2019-12-07 DIAGNOSIS — R55 Syncope and collapse: Secondary | ICD-10-CM

## 2019-12-07 DIAGNOSIS — I2721 Secondary pulmonary arterial hypertension: Secondary | ICD-10-CM

## 2019-12-07 NOTE — Telephone Encounter (Signed)
Spoke with pt.  (See Procedures, 12/04/19)

## 2019-12-07 NOTE — Telephone Encounter (Signed)
Patient's husband called.  He said Lattie Haw left a message for patient to return her call.  Please call patient back.

## 2019-12-07 NOTE — Telephone Encounter (Signed)
Pt and her husband returned your call.  Please call again. Thank you!

## 2019-12-19 ENCOUNTER — Other Ambulatory Visit: Payer: Self-pay

## 2019-12-19 ENCOUNTER — Ambulatory Visit (INDEPENDENT_AMBULATORY_CARE_PROVIDER_SITE_OTHER): Payer: Medicare Other | Admitting: Family Medicine

## 2019-12-19 ENCOUNTER — Encounter: Payer: Self-pay | Admitting: Family Medicine

## 2019-12-19 VITALS — BP 136/78 | HR 57 | Temp 97.5°F | Ht 62.0 in | Wt 171.2 lb

## 2019-12-19 DIAGNOSIS — R0981 Nasal congestion: Secondary | ICD-10-CM | POA: Diagnosis not present

## 2019-12-19 DIAGNOSIS — E1142 Type 2 diabetes mellitus with diabetic polyneuropathy: Secondary | ICD-10-CM

## 2019-12-19 DIAGNOSIS — R55 Syncope and collapse: Secondary | ICD-10-CM | POA: Diagnosis not present

## 2019-12-19 DIAGNOSIS — R053 Chronic cough: Secondary | ICD-10-CM

## 2019-12-19 DIAGNOSIS — G478 Other sleep disorders: Secondary | ICD-10-CM | POA: Diagnosis not present

## 2019-12-19 LAB — POCT GLYCOSYLATED HEMOGLOBIN (HGB A1C): Hemoglobin A1C: 6.7 % — AB (ref 4.0–5.6)

## 2019-12-19 MED ORDER — OMEPRAZOLE 40 MG PO CPDR
40.0000 mg | DELAYED_RELEASE_CAPSULE | Freq: Every day | ORAL | 3 refills | Status: DC
Start: 2019-12-19 — End: 2021-04-17

## 2019-12-19 MED ORDER — ATORVASTATIN CALCIUM 20 MG PO TABS: 20.0000 mg | ORAL_TABLET | ORAL | 3 refills | Status: DC

## 2019-12-19 NOTE — Progress Notes (Signed)
This visit was conducted in person.  BP 136/78 (BP Location: Left Arm, Patient Position: Sitting, Cuff Size: Normal)    Pulse (!) 57    Temp (!) 97.5 F (36.4 C) (Temporal)    Ht _0  (1.575 m)    Wt 171 lb 3 oz (77.7 kg)    SpO2 96%    BMI 31.31 kg/m    CC: f/u visit  Subjective:    Patient ID: [name], female    DOB: 10-04-1934, 85 y.o.   MRN: [id number]  HPI: [name] is a 85 y.o. female presenting on 12/19/2019 for Follow-up (Wants to discuss aspirin.)   See prior note for details.  Pending sleep eval [name] 01/2020). I also placed pulm referral for separate chronic cough, congestion, dyspnea and new PAH on echo. Referral was cancelled by pulm office given upcoming appt with Dr [name].   Syncope workup reassuringly ok - head CT, echocardiogram, carotid US.   Echocardiogram 11/2019 - EF 60-65%, mod LVH, G1DD, mild MVR, increased RA pressure (70mHg)  Carotid [place]10/2021 - WNL Head CT 11/2019 - mild gen cerebral atrophy, mild ethmoid sinus mucosal thickening.   Significant improvement in sinus congestion after prednisone course. Tolerated well. Chronic cough improved as well but continues. [facility] cough drops also help.   Asks about ongoing need for aspirin. Not taking atorvastatin - didn't like how she felt on it.   DM - foot exam by podiatrist - needs to schedule f/u Diabetic Foot Exam - Simple   Simple Foot Form Diabetic Foot exam was performed with the following findings: Yes 12/19/2019 10:47 AM  Visual Inspection See comments: Yes Sensation Testing See comments: Yes Pulse Check Posterior Tibialis and Dorsalis pulse intact bilaterally: Yes Comments 2+ DP bilaterally  Corn/callus present bilaterally Diminished sensation to monofilament testing        Relevant past medical, surgical, family and social history reviewed and updated as indicated. Interim medical history since our last visit reviewed. Allergies and medications reviewed and  updated. Outpatient Medications Prior to Visit  Medication Sig Dispense Refill   ascorbic acid (VITAMIN C) 500 MG tablet Take 1 tablet (500 mg total) by mouth daily.     atenolol (TENORMIN) 50 MG tablet TAKE 1 TABLET BY MOUTH  DAILY 90 tablet 3   Blood Glucose Monitoring Suppl (ONE TOUCH ULTRA SYSTEM KIT) W/DEVICE KIT 1 kit by Does not apply route once. 1 each 0   Cholecalciferol (VITAMIN D3) 25 MCG (1000 UT) CAPS Take 1 capsule (1,000 Units total) by mouth daily. 30 capsule    Coenzyme Q10 (COQ10) 100 MG CAPS Take 1 capsule by mouth daily.     cyanocobalamin (V-R VITAMIN B-12) 500 MCG tablet Take 1 tablet (500 mcg total) by mouth daily.     Lancet Device MISC One touch delica - Use as directed 1 each 1   Lancets (ONETOUCH DELICA PLUS [id number] MISC CHECK BLOOD SUGAR 1-2 TIMES DAILY AS NEEDED 100 each 3   Lancets Misc. (ACCU-CHEK SOFTCLIX LANCET DEV) KIT Use at home to test blood sugars daily. 1 kit 0   levothyroxine (SYNTHROID) 112 MCG tablet TAKE 1 TABLET BY MOUTH  DAILY 90 tablet 3   losartan (COZAAR) 50 MG tablet TAKE 1 TABLET BY MOUTH  DAILY 90 tablet 3   montelukast (SINGULAIR) 10 MG tablet TAKE 1 TABLET BY MOUTH  DAILY 90 tablet 3   ONETOUCH ULTRA test strip TEST AS DIRECTED ONCE DAILY 100 strip 3   triamcinolone cream (  KENALOG) 0.1 % Apply 1 application topically 2 (two) times daily. Apply to AA. 45 g 1   aspirin 81 MG tablet Take 81 mg by mouth every Monday, Wednesday, and Friday.      omeprazole (PRILOSEC) 40 MG capsule Take 1 capsule (40 mg total) by mouth daily. 90 capsule 3   atorvastatin (LIPITOR) 20 MG tablet Take 1 tablet (20 mg total) by mouth daily. (Patient not taking: Reported on 11/13/2019) 90 tablet 3   co-enzyme Q-10 50 MG capsule Take 1 capsule (50 mg total) by mouth 2 (two) times daily.     predniSONE (DELTASONE) 20 MG tablet Take two tablets daily for 3 days followed by one tablet daily for 3 days 9 tablet 0   No facility-administered medications  prior to visit.     Per HPI unless specifically indicated in ROS section below Review of Systems Objective:  BP 136/78 (BP Location: Left Arm, Patient Position: Sitting, Cuff Size: Normal)    Pulse (!) 57    Temp (!) 97.5 F (36.4 C) (Temporal)    Ht _0  (1.575 m)    Wt 171 lb 3 oz (77.7 kg)    SpO2 96%    BMI 31.31 kg/m   Wt Readings from Last 3 Encounters:  12/19/19 171 lb 3 oz (77.7 kg)  11/13/19 167 lb 8 oz (76 kg)  11/07/19 165 lb 4 oz (75 kg)      Physical Exam Vitals and nursing note reviewed.  Constitutional:      General: She is not in acute distress.    Appearance: Normal appearance. She is well-developed. She is not ill-appearing.  Eyes:     General: No scleral icterus.    Extraocular Movements: Extraocular movements intact.     Conjunctiva/sclera: Conjunctivae normal.     Pupils: Pupils are equal, round, and reactive to light.  Cardiovascular:     Rate and Rhythm: Normal rate and regular rhythm.     Pulses: Normal pulses.     Heart sounds: Normal heart sounds. No murmur heard.   Pulmonary:     Effort: Pulmonary effort is normal. No respiratory distress.     Breath sounds: Normal breath sounds. No wheezing, rhonchi or rales.  Musculoskeletal:     Cervical back: Normal range of motion and neck supple.     Right lower leg: No edema.     Left lower leg: No edema.     Comments: See HPI for foot exam if done  Lymphadenopathy:     Cervical: No cervical adenopathy.  Skin:    General: Skin is warm and dry.     Findings: No rash.  Neurological:     Mental Status: She is alert.       Results for orders placed or performed in visit on 12/19/19  POCT glycosylated hemoglobin (Hb A1C)  Result Value Ref Range   Hemoglobin A1C 6.7 (A) 4.0 - 5.6 %   HbA1c POC (<> result, manual entry)     HbA1c, POC (prediabetic range)     HbA1c, POC (controlled diabetic range)     Lab Results  Component Value Date   HGBA1C 6.7 (A) 12/19/2019   Assessment & Plan:  This visit  occurred during the SARS-CoV-2 public health emergency.  Safety protocols were in place, including screening questions prior to the visit, additional usage of staff PPE, and extensive cleaning of exam room while observing appropriate contact time as indicated for disinfecting solutions.   Problem List Items Addressed This  Visit    Type 2 diabetes, controlled, with peripheral neuropathy ([place]) - Primary    Chronic, diet controlled diabetes.  Evidence of neuropathy on monofilament testing. Discussed b12 and alpha lipoic acid supplementation.  She will call podiatry for exam.  Reviewed indication for statin in diabetic although she doesn't have HLD history. She did not tolerate daily atorvastatin. Will try once weekly atorva.  Ok to stop aspirin.       Relevant Medications   atorvastatin (LIPITOR) 20 MG tablet   Other Relevant Orders   POCT glycosylated hemoglobin (Hb A1C) (Completed)   Syncope and collapse    Workup reassuring (carotid US, echo, head CT).  No recurrent episode.       Non-restorative sleep    Endorses parox nocturnal dyspnea, snoring, daytime somnolence along with intermittent dyspnea and evidence of PAH on recent echo. Pending OSA evaluation next month.       Nasal sinus congestion    Recent head CT with evidence of mild ethmoid sinus mucosal thickening, and she had marked improvement after short prednisone course. Does not tolerate nasal steroids. Will continue to monitor for now on singulair.       Chronic cough    Pending pulm eval for this along with PAH.  Prednisone course helped but cough persists.           Meds ordered this encounter  Medications   omeprazole (PRILOSEC) 40 MG capsule    Sig: Take 1 capsule (40 mg total) by mouth daily.    Dispense:  90 capsule    Refill:  3   atorvastatin (LIPITOR) 20 MG tablet    Sig: Take 1 tablet (20 mg total) by mouth once a week.    Dispense:  12 tablet    Refill:  3   Orders Placed This Encounter   Procedures   POCT glycosylated hemoglobin (Hb A1C)    Patient Instructions  Pare aspirina.  Empieze lipitor 35m una vez semanalmente.  Esperaremos evaluacion de pulmonologo el mes entrante.  Para neuropatia - vitamin B12 es importante y [place]ued[place] tratar suplemento de acido alpha-lipoico.  Regresar en 3 meses para proximo examen fisico.    Follow up plan: Return in about 3 months (around 03/20/2020) for annual exam, prior fasting for blood work, medicare wellness visit.  JRia Bush MD

## 2019-12-19 NOTE — Patient Instructions (Addendum)
Pare aspirina.  Empieze lipitor 20mg  una vez semanalmente.  Esperaremos evaluacion de pulmonologo el mes entrante.  Para neuropatia - vitamin B12 es importante y [place] puede tratar suplemento de acido alpha-lipoico.  Regresar en 3 meses para proximo examen fisico.

## 2019-12-20 NOTE — Assessment & Plan Note (Deleted)
Endorses parox nocturnal dyspnea, snoring, along with intermittent dyspnea and evidence of PAH on recent echo. Pending OSA evaluation next month.

## 2019-12-20 NOTE — Assessment & Plan Note (Addendum)
Recent head CT with evidence of mild ethmoid sinus mucosal thickening, and she had marked improvement after short prednisone course. Does not tolerate nasal steroids. Will continue to monitor for now on singulair.

## 2019-12-20 NOTE — Assessment & Plan Note (Signed)
Endorses parox nocturnal dyspnea, snoring, daytime somnolence along with intermittent dyspnea and evidence of PAH on recent echo. Pending OSA evaluation next month.

## 2019-12-20 NOTE — Assessment & Plan Note (Addendum)
Chronic, diet controlled diabetes.  Evidence of neuropathy on monofilament testing. Discussed b12 and alpha lipoic acid supplementation.  She will call podiatry for exam.  Reviewed indication for statin in diabetic although she doesn't have HLD history. She did not tolerate daily atorvastatin. Will try once weekly atorva.  Ok to stop aspirin.

## 2019-12-20 NOTE — Assessment & Plan Note (Signed)
Pending pulm eval for this along with PAH.  Prednisone course helped but cough persists.

## 2019-12-20 NOTE — Assessment & Plan Note (Addendum)
Workup reassuring (carotid US, echo, head CT).  No recurrent episode.

## 2019-12-22 ENCOUNTER — Other Ambulatory Visit: Payer: Self-pay | Admitting: Family Medicine

## 2020-01-02 ENCOUNTER — Other Ambulatory Visit: Payer: Self-pay | Admitting: Family Medicine

## 2020-01-03 ENCOUNTER — Telehealth: Payer: Self-pay | Admitting: Family Medicine

## 2020-01-03 NOTE — Telephone Encounter (Signed)
E-scribed refill.  Pt scheduled for cpe 03/21/20.  Plz schedule wellness and lab visits before that OV with Dr. Darnell Level.

## 2020-01-03 NOTE — Telephone Encounter (Signed)
Needs to schedule MWV with health nurse and cpe labs EM Left vm for the patient to call our office. 01/03/20

## 2020-01-17 ENCOUNTER — Ambulatory Visit (INDEPENDENT_AMBULATORY_CARE_PROVIDER_SITE_OTHER): Payer: Medicare Other | Admitting: Pulmonary Disease

## 2020-01-17 ENCOUNTER — Encounter: Payer: Self-pay | Admitting: Pulmonary Disease

## 2020-01-17 ENCOUNTER — Other Ambulatory Visit: Payer: Self-pay

## 2020-01-17 ENCOUNTER — Other Ambulatory Visit: Payer: Self-pay | Admitting: Internal Medicine

## 2020-01-17 VITALS — BP 130/80 | HR 71 | Temp 97.5°F | Ht 62.0 in | Wt 173.0 lb

## 2020-01-17 DIAGNOSIS — J301 Allergic rhinitis due to pollen: Secondary | ICD-10-CM | POA: Diagnosis not present

## 2020-01-17 DIAGNOSIS — R0683 Snoring: Secondary | ICD-10-CM | POA: Diagnosis not present

## 2020-01-17 DIAGNOSIS — J342 Deviated nasal septum: Secondary | ICD-10-CM | POA: Diagnosis not present

## 2020-01-17 DIAGNOSIS — R053 Chronic cough: Secondary | ICD-10-CM

## 2020-01-17 MED ORDER — LEVOCETIRIZINE DIHYDROCHLORIDE 5 MG PO TABS: 5.0000 mg | ORAL_TABLET | Freq: Every evening | ORAL | 5 refills | Status: DC

## 2020-01-17 NOTE — Patient Instructions (Signed)
Xyzal 5 mg antihistamine pill nightly.  Continue using montelukast (singulair) 10 mg pill nightly.  Try using nasal irrigation (saline nasal spray) nightly.  You can purchase this from your pharmacy in the sinus medication section.  Will arrange for pulmonary function test and home sleep study  Follow up in 8 weeks

## 2020-01-17 NOTE — Progress Notes (Signed)
[name]  Chief Complaint  Patient presents with  . Consult    chronic cough >1 year, dry cough.  snoring, not sleeping well, daytime sleepiness.    Constitutional:  BP 130/80 (BP Location: Left Arm, Patient Position: Sitting, Cuff Size: Normal)   Pulse 71   Temp (!) 97.5 F (36.4 C) (Temporal)   Ht 5' 2"  (1.575 m)   Wt 173 lb (78.5 kg)   SpO2 97%   BMI 31.64 kg/m   Past Medical History:  OA, Cararact, Colon polyps, DM type 2, HA, Glaucoma, HTN, Hypothyroidism  Past Surgical History:  Her  has a past surgical history that includes Appendectomy (1998); Cholecystectomy; Tonsillectomy; Bunionectomy; Eye surgery; Colonoscopy (2007); and Colonoscopy with propofol (N/A, 07/22/2016).  Brief Summary:  [name] is a 85 y.o. female with chronic cough and snoring.      Subjective:   She has been short of breath her whole life.  She gets episodes of cough and sometimes gets wheezing.  She had allergy testing done years ago and was on allergy shots.  She doesn't remember why these were stopped.  She has trouble breathing through her nose, especially when asleep at night.  She feels congested and gets drainage.  She uses Vicks Vapor Rub and this helps.  She has been using singulair for years.  Her brother was prescribed some type of inhaler years ago and this apparently dried him out so much that it was life threatening.  One of her doctors told her years ago never to use any inhalers and she is very reluctant to try any inhalers.  She was seen by dermatology recently for itching on her back.  She was prescribed a cream and this helps.  She never smoked cigarettes.  No history of pneumonia.  She did get bronchitis several times before.  Never been told she has asthma.  Chest xray from 11/15/19 showed scarring at Lt base.    Her sleep has been an issue for years also.  She will snore and then wake up feeling like she has trouble with her  breathing.   She goes to sleep at 10 pm.  She falls asleep 4 to 5 minutes.  She wakes up 2 to 3 times to use the bathroom.  She gets out of bed at 8 am.  She feels tired in the morning.  She denies morning headache.  She does not use anything to help her fall sleep or stay awake.  She denies sleep walking, sleep talking, bruxism, or nightmares.  There is no history of restless legs.  She denies sleep hallucinations, sleep paralysis, or cataplexy.  The Epworth score is 14 out of 24.    Physical Exam:   Appearance - well kempt   ENMT - no sinus tenderness, no oral exudate, no LAN, Mallampati 4 airway, no stridor, scalloped tongue border, deviated nasal septum, clear nasal drainage  Respiratory - equal breath sounds bilaterally, no wheezing or rales  CV - s1s2 regular rate and rhythm, no murmurs  Ext - no clubbing, no edema  Skin - no rashes  Psych - normal mood and affect   Pulmonary testing:    Chest Imaging:    Sleep Tests:    Cardiac Tests:   Echo 12/04/19 >> EF 60 to 65%, grade 1 DD, mild MR  Social History:  She  reports that she has never smoked. She has never used smokeless tobacco. She reports previous alcohol use.  She reports that she does not use drugs.  Family History:  Her family history includes Arthritis in an other family member; Asthma in her mother; Cancer in her brother, maternal aunt, and maternal grandfather; Crohn's disease in her brother; Deep vein thrombosis in her father; Diabetes in her brother, sister, and another family member; Heart attack (age of onset: 65) in her mother; Heart disease in her father and mother; Leukemia in her brother.    Discussion:  She has several different issues that need to be addressed in relation to her cough and sleep issues: allergic rhinitis, asthma, nasal septal deviation and possible sleep apnea.  Assessment/Plan:   Chronic cough. - likely from allergic rhinitis made worse by deviated nasal septum and allergic  asthma - will arrange for pulmonary function testing to further assess - reviewed her chest xray findings from September 2021, and don't think she needs additional imaging studies at this time - will have her try nasal irrigation and xyzal - continue singulair - I am not sure which inhaler her brother was prescribed and explained that we have different inhaler options for her lungs and sinuses that shouldn't cause life threatening reactions; she is still very reluctant to try inhalers and will need to think this over further before agreeing to try inhalers - she might need additional allergy testing done  Snoring. - she has sleep disruption, apnea, and daytime sleepiness - she has history of hypertension and diabetes - I am concerned she could have obstructive sleep apnea - reviewed how sleep apnea can impact her health - will arrange for home sleep study to further assess  Time Spent Involved in Patient Care on Day of Examination:  50 minutes  Follow up:  Patient Instructions  Xyzal 5 mg antihistamine pill nightly.  Continue using montelukast (singulair) 10 mg pill nightly.  Try using nasal irrigation (saline nasal spray) nightly.  You can purchase this from your pharmacy in the sinus medication section.  Will arrange for pulmonary function test and home sleep study  Follow up in 8 weeks    Medication List:   Allergies as of 01/17/2020      Reactions   Align [acidophilus]    Worsened abd bloating, pressure   Augmentin [amoxicillin-pot Clavulanate]    Possible, had itchy rash   Cefprozil Swelling   Metformin And Related Other (See Comments)   Severe stomach pains, muscle aches   Timolol    Local reaction to eye drops      Medication List       Accurate as of January 17, 2020 12:02 PM. If you have any questions, ask your nurse or doctor.        Accu-Chek Softclix Lancet Dev Kit Use at home to test blood sugars daily.   ascorbic acid 500 MG tablet Commonly known  as: VITAMIN C Take 1 tablet (500 mg total) by mouth daily.   atenolol 50 MG tablet Commonly known as: TENORMIN TAKE 1 TABLET BY MOUTH  DAILY   atorvastatin 20 MG tablet Commonly known as: LIPITOR Take 1 tablet (20 mg total) by mouth once a week.   CoQ10 100 MG Caps Take 1 capsule by mouth daily.   Lancet Device Misc One touch delica - Use as directed   levocetirizine 5 MG tablet Commonly known as: Xyzal Take 1 tablet (5 mg total) by mouth every evening. Started by: Chesley Mires, MD   levothyroxine 112 MCG tablet Commonly known as: SYNTHROID TAKE 1 TABLET BY MOUTH  DAILY  losartan 50 MG tablet Commonly known as: COZAAR TAKE 1 TABLET BY MOUTH  DAILY   montelukast 10 MG tablet Commonly known as: SINGULAIR TAKE 1 TABLET BY MOUTH  DAILY   omeprazole 40 MG capsule Commonly known as: PRILOSEC Take 1 capsule (40 mg total) by mouth daily.   ONE TOUCH ULTRA SYSTEM KIT w/Device Kit 1 kit by Does not apply route once.   OneTouch Delica Plus [id number] Misc CHECK BLOOD SUGAR 1-2 TIMES DAILY AS NEEDED   OneTouch Ultra test strip Generic drug: glucose blood TEST AS DIRECTED ONCE DAILY   triamcinolone 0.1 % Commonly known as: KENALOG Apply 1 application topically 2 (two) times daily. Apply to AA.   vitamin B-12 500 MCG tablet Commonly known as: V-R VITAMIN B-12 Take 1 tablet (500 mcg total) by mouth daily.   Vitamin D3 25 MCG (1000 UT) Caps Take 1 capsule (1,000 Units total) by mouth daily.       Signature:  Chesley Mires, MD [place] Pager - [phone] 01/17/2020, 12:02 PM

## 2020-01-24 ENCOUNTER — Telehealth: Payer: Self-pay | Admitting: Family Medicine

## 2020-01-25 NOTE — Telephone Encounter (Signed)
E-scribed refills.  Pt scheduled for cpe on 03/21/20.  Plz schedule wellness and lab visits before cpe.

## 2020-01-31 ENCOUNTER — Ambulatory Visit: Payer: Medicare Other | Admitting: Podiatry

## 2020-01-31 ENCOUNTER — Encounter: Payer: Self-pay | Admitting: Podiatry

## 2020-01-31 ENCOUNTER — Other Ambulatory Visit: Payer: Self-pay

## 2020-01-31 DIAGNOSIS — Q828 Other specified congenital malformations of skin: Secondary | ICD-10-CM | POA: Diagnosis not present

## 2020-01-31 DIAGNOSIS — E1142 Type 2 diabetes mellitus with diabetic polyneuropathy: Secondary | ICD-10-CM | POA: Diagnosis not present

## 2020-01-31 DIAGNOSIS — M778 Other enthesopathies, not elsewhere classified: Secondary | ICD-10-CM | POA: Diagnosis not present

## 2020-01-31 NOTE — Progress Notes (Signed)
Subjective:  Patient ID: [name], female    DOB: 1934/06/20,  MRN: [id number]  Chief Complaint  Patient presents with  . Callouses    Patient presents for painful porokeratosis bilat forefeet, wants trimmed    85 y.o. female presents with the above complaint.  Patient presents with submetatarsal right benign skin lesion with associated capsulitis.  Patient states she got 3 to 4 months of relief after injection with debridement.  She would like to pursue this further.  She would also like to discuss surgical options as well.  She denies any other acute complaints   Review of Systems: Negative except as noted in the HPI. Denies N/V/F/Ch.  Past Medical History:  Diagnosis Date  . Allergy    hay fever  . Arthritis   . Cataract    resolved with surgery  . Chicken pox   . Colon polyp   . Diabetes mellitus 2008  . Generalized headaches    thinks caused by glaucoma  . Glaucoma   . Hypertension   . Right sided sciatica 07/03/2013  . Thyroid disease    hypothyroidism  . Urinary incontinence   . UTI (urinary tract infection)     Current Outpatient Medications:  .  ascorbic acid (VITAMIN C) 500 MG tablet, Take 1 tablet (500 mg total) by mouth daily., Disp:  , Rfl:  .  atenolol (TENORMIN) 50 MG tablet, TAKE 1 TABLET BY MOUTH  DAILY, Disp: 90 tablet, Rfl: 3 .  atorvastatin (LIPITOR) 20 MG tablet, Take 1 tablet (20 mg total) by mouth once a week., Disp: 12 tablet, Rfl: 3 .  Blood Glucose Monitoring Suppl (ONE TOUCH ULTRA SYSTEM KIT) W/DEVICE KIT, 1 kit by Does not apply route once., Disp: 1 each, Rfl: 0 .  Cholecalciferol (VITAMIN D3) 25 MCG (1000 UT) CAPS, Take 1 capsule (1,000 Units total) by mouth daily., Disp: 30 capsule, Rfl:  .  Coenzyme Q10 (COQ10) 100 MG CAPS, Take 1 capsule by mouth daily., Disp: , Rfl:  .  cyanocobalamin (V-R VITAMIN B-12) 500 MCG tablet, Take 1 tablet (500 mcg total) by mouth daily., Disp: , Rfl:  .  Lancet Device MISC, One touch delica - Use as  directed, Disp: 1 each, Rfl: 1 .  Lancets (ONETOUCH DELICA PLUS [id number]) MISC, CHECK BLOOD SUGAR 1-2 TIMES DAILY AS NEEDED, Disp: 100 each, Rfl: 3 .  Lancets Misc. (ACCU-CHEK SOFTCLIX LANCET DEV) KIT, Use at home to test blood sugars daily., Disp: 1 kit, Rfl: 0 .  levocetirizine (XYZAL) 5 MG tablet, Take 1 tablet (5 mg total) by mouth every evening., Disp: 30 tablet, Rfl: 5 .  levothyroxine (SYNTHROID) 112 MCG tablet, TAKE 1 TABLET BY MOUTH  DAILY, Disp: 90 tablet, Rfl: 0 .  losartan (COZAAR) 50 MG tablet, TAKE 1 TABLET BY MOUTH  DAILY, Disp: 90 tablet, Rfl: 0 .  montelukast (SINGULAIR) 10 MG tablet, TAKE 1 TABLET BY MOUTH  DAILY, Disp: 90 tablet, Rfl: 0 .  omeprazole (PRILOSEC) 40 MG capsule, Take 1 capsule (40 mg total) by mouth daily., Disp: 90 capsule, Rfl: 3 .  ONETOUCH ULTRA test strip, TEST AS DIRECTED ONCE DAILY, Disp: 100 strip, Rfl: 3 .  triamcinolone cream (KENALOG) 0.1 %, Apply 1 application topically 2 (two) times daily. Apply to AA., Disp: 45 g, Rfl: 1  Social History   Tobacco Use  Smoking Status Never Smoker  Smokeless Tobacco Never Used    Allergies  Allergen Reactions  . Align [Acidophilus]     Worsened abd  bloating, pressure  . Augmentin [Amoxicillin-Pot Clavulanate]     Possible, had itchy rash  . Cefprozil Swelling  . Metformin And Related Other (See Comments)    Severe stomach pains, muscle aches  . Timolol     Local reaction to eye drops   Objective:  There were no vitals filed for this visit. There is no height or weight on file to calculate BMI. Constitutional Well developed. Well nourished.  Vascular Dorsalis pedis pulses palpable bilaterally. Posterior tibial pulses palpable bilaterally. Capillary refill normal to all digits.  No cyanosis or clubbing noted. Pedal hair growth normal.  Neurologic Normal speech. Oriented to person, place, and time. Epicritic sensation to light touch grossly present bilaterally.  Dermatologic Nails well groomed and  normal in appearance. No open wounds. No skin lesions.  Orthopedic:  Pain on palpation to the right submetatarsal 4 porokeratosis.  Pain on palpation.  Plantarflexed fourth metatarsal noted.  Hyperkeratotic lesion noted submetatarsal 4.  Central nucleated core noted.   Radiographs: None Assessment:   1. Type 2 diabetes, controlled, with peripheral neuropathy (HCC)   2. Capsulitis of right foot   3. Porokeratosis    Plan:  Patient was evaluated and treated and all questions answered.  Right submetatarsal 4 porokeratosis with underlying capsulitis -I explained to the patient the etiology of porokeratosis and various treatment options were extensively discussed.  I believe the porokeratosis that led to underlying capsulitis and patient will benefit from a steroid injection.  Patient agrees with the plan would like to proceed with a steroid injection.  I briefly discussed with the patient surgical intervention that involves dorsiflexor osteotomy of the metatarsal head to take the pressure away.  I also discussed with the patient orthotics management as well. -Offloading pad was dispensed if they are beneficial will consider orthotics management versus surgical management. -Using chisel blade and a handle, the hyperkeratotic lesion was aggressively debrided down to healthy striated tissue.  No complications noted.  No pinpoint bleeding noted.  The excision of the nucleated core was performed. -A second steroid injection was performed at right fourth metatarsophalangeal joint using 1% plain Lidocaine and 10 mg of Kenalog. This was well tolerated.   No follow-ups on file.

## 2020-02-06 NOTE — Telephone Encounter (Signed)
Called and left vm for the patient to call and schedule wellness and labs EM 02/06/20

## 2020-02-07 DIAGNOSIS — H401133 Primary open-angle glaucoma, bilateral, severe stage: Secondary | ICD-10-CM | POA: Diagnosis not present

## 2020-02-11 ENCOUNTER — Other Ambulatory Visit: Payer: Self-pay | Admitting: Family Medicine

## 2020-03-21 ENCOUNTER — Ambulatory Visit: Payer: Medicare Other | Admitting: Family Medicine

## 2020-03-26 DIAGNOSIS — H029 Unspecified disorder of eyelid: Secondary | ICD-10-CM | POA: Diagnosis not present

## 2020-03-27 ENCOUNTER — Other Ambulatory Visit: Payer: Self-pay | Admitting: Family Medicine

## 2020-03-27 DIAGNOSIS — E538 Deficiency of other specified B group vitamins: Secondary | ICD-10-CM

## 2020-03-27 DIAGNOSIS — E1142 Type 2 diabetes mellitus with diabetic polyneuropathy: Secondary | ICD-10-CM

## 2020-03-27 DIAGNOSIS — E039 Hypothyroidism, unspecified: Secondary | ICD-10-CM

## 2020-03-28 ENCOUNTER — Other Ambulatory Visit: Payer: Self-pay

## 2020-03-28 ENCOUNTER — Other Ambulatory Visit (INDEPENDENT_AMBULATORY_CARE_PROVIDER_SITE_OTHER): Payer: Medicare Other

## 2020-03-28 DIAGNOSIS — E1142 Type 2 diabetes mellitus with diabetic polyneuropathy: Secondary | ICD-10-CM

## 2020-03-28 DIAGNOSIS — E039 Hypothyroidism, unspecified: Secondary | ICD-10-CM | POA: Diagnosis not present

## 2020-03-28 DIAGNOSIS — E538 Deficiency of other specified B group vitamins: Secondary | ICD-10-CM | POA: Diagnosis not present

## 2020-03-28 DIAGNOSIS — R7989 Other specified abnormal findings of blood chemistry: Secondary | ICD-10-CM

## 2020-03-28 LAB — CBC WITH DIFFERENTIAL/PLATELET
Basophils Absolute: 0 10*3/uL (ref 0.0–0.1)
Basophils Relative: 0.3 % (ref 0.0–3.0)
Eosinophils Absolute: 0.1 10*3/uL (ref 0.0–0.7)
Eosinophils Relative: 2.3 % (ref 0.0–5.0)
HCT: 42.9 % (ref 36.0–46.0)
Hemoglobin: 14.1 g/dL (ref 12.0–15.0)
Lymphocytes Relative: 35.9 % (ref 12.0–46.0)
Lymphs Abs: 1.9 10*3/uL (ref 0.7–4.0)
MCHC: 32.8 g/dL (ref 30.0–36.0)
MCV: 91.3 fl (ref 78.0–100.0)
Monocytes Absolute: 0.4 10*3/uL (ref 0.1–1.0)
Monocytes Relative: 8.4 % (ref 3.0–12.0)
Neutro Abs: 2.8 10*3/uL (ref 1.4–7.7)
Neutrophils Relative %: 53.1 % (ref 43.0–77.0)
Platelets: 205 10*3/uL (ref 150.0–400.0)
RBC: 4.7 Mil/uL (ref 3.87–5.11)
RDW: 14.1 % (ref 11.5–15.5)
WBC: 5.3 10*3/uL (ref 4.0–10.5)

## 2020-03-28 LAB — COMPREHENSIVE METABOLIC PANEL
ALT: 30 U/L (ref 0–35)
AST: 19 U/L (ref 0–37)
Albumin: 4.2 g/dL (ref 3.5–5.2)
Alkaline Phosphatase: 63 U/L (ref 39–117)
BUN: 21 mg/dL (ref 6–23)
CO2: 31 mEq/L (ref 19–32)
Calcium: 9.4 mg/dL (ref 8.4–10.5)
Chloride: 103 mEq/L (ref 96–112)
Creatinine, Ser: 0.91 mg/dL (ref 0.40–1.20)
GFR: 58.48 mL/min — ABNORMAL LOW (ref >60.00)
Glucose, Bld: 124 mg/dL — ABNORMAL HIGH (ref 70–99)
Potassium: 4.5 mEq/L (ref 3.5–5.1)
Sodium: 141 mEq/L (ref 135–145)
Total Bilirubin: 0.7 mg/dL (ref 0.2–1.2)
Total Protein: 6.9 g/dL (ref 6.0–8.3)

## 2020-03-28 LAB — LIPID PANEL
Cholesterol: 127 mg/dL (ref 0–200)
HDL: 50.1 mg/dL (ref >39.00)
LDL Cholesterol: 63 mg/dL (ref 0–99)
NonHDL: 76.52
Total CHOL/HDL Ratio: 3
Triglycerides: 70 mg/dL (ref 0.0–149.0)
VLDL: 14 mg/dL (ref 0.0–40.0)

## 2020-03-28 LAB — TSH: TSH: 0.88 u[IU]/mL (ref 0.35–4.50)

## 2020-03-28 LAB — VITAMIN B12: Vitamin B-12: 597 pg/mL (ref 211–911)

## 2020-03-31 ENCOUNTER — Encounter: Payer: Self-pay | Admitting: Family Medicine

## 2020-03-31 ENCOUNTER — Other Ambulatory Visit: Payer: Self-pay

## 2020-03-31 ENCOUNTER — Ambulatory Visit (INDEPENDENT_AMBULATORY_CARE_PROVIDER_SITE_OTHER): Payer: Medicare Other | Admitting: Family Medicine

## 2020-03-31 VITALS — BP 124/70 | HR 61 | Temp 97.8°F | Ht 62.0 in | Wt 171.2 lb

## 2020-03-31 DIAGNOSIS — H409 Unspecified glaucoma: Secondary | ICD-10-CM

## 2020-03-31 DIAGNOSIS — J309 Allergic rhinitis, unspecified: Secondary | ICD-10-CM

## 2020-03-31 DIAGNOSIS — E538 Deficiency of other specified B group vitamins: Secondary | ICD-10-CM

## 2020-03-31 DIAGNOSIS — Z0000 Encounter for general adult medical examination without abnormal findings: Secondary | ICD-10-CM | POA: Diagnosis not present

## 2020-03-31 DIAGNOSIS — E1142 Type 2 diabetes mellitus with diabetic polyneuropathy: Secondary | ICD-10-CM

## 2020-03-31 DIAGNOSIS — R053 Chronic cough: Secondary | ICD-10-CM

## 2020-03-31 DIAGNOSIS — M85852 Other specified disorders of bone density and structure, left thigh: Secondary | ICD-10-CM

## 2020-03-31 DIAGNOSIS — H9193 Unspecified hearing loss, bilateral: Secondary | ICD-10-CM

## 2020-03-31 DIAGNOSIS — Z7189 Other specified counseling: Secondary | ICD-10-CM

## 2020-03-31 DIAGNOSIS — G478 Other sleep disorders: Secondary | ICD-10-CM

## 2020-03-31 DIAGNOSIS — I10 Essential (primary) hypertension: Secondary | ICD-10-CM

## 2020-03-31 DIAGNOSIS — E039 Hypothyroidism, unspecified: Secondary | ICD-10-CM

## 2020-03-31 LAB — HEMOGLOBIN A1C: Hgb A1c MFr Bld: 6.8 % — ABNORMAL HIGH (ref 4.6–6.5)

## 2020-03-31 NOTE — Progress Notes (Unsigned)
Patient ID: [name], female    DOB: [birth year], 86 y.o.   MRN: [id number]  This visit was conducted in person.  BP 124/70   Pulse 61   Temp 97.8 F (36.6 C) (Temporal)   Ht 5' 2"  (1.575 m)   Wt 171 lb 4 oz (77.7 kg)   SpO2 98%   BMI 31.32 kg/m    CC: AMW f/u visit  Subjective:   HPI: [name] is a 86 y.o. female presenting on 03/31/2020 for Medicare Wellness (Pt accompanied by husband, [name]- temp 97.8.)   Did not see health advisor this year.    Hearing Screening   125Hz  250Hz  500Hz  1000Hz  2000Hz  3000Hz  4000Hz  6000Hz  8000Hz   Right ear:   25 40 0  0    Left ear:   25 40 0  0    Vision Screening Comments: Last eye exam, 01/2020.  [facility] Visit from 03/31/2020 in [place] [facility] [place]  PHQ-2 Total Score 0      Fall Risk  03/31/2020 02/02/2019 01/31/2018 01/27/2017 12/19/2015  Falls in the past year? 0 0 1 No No  Comment - - lost balance and fell on floor in bedroom - -  Number falls in past yr: - 0 0 - -  Injury with Fall? - 0 0 - -  Risk for fall due to : - Medication side effect - - -  Follow up - Falls evaluation completed;Falls prevention discussed - - -    Chronic cough and poor sleep - saw pulm Dr Halford Chessman - thought allergic rhinitis with allervic asthma - planned PFTs, rec HST eval OSA (both pending). Started on nasal saline irrigation and xyzal, continued singulair. Evidence of PAH on latest echo. She started using cough drops and this has significantly helped. We also started omeprazole 12/2019. She states she only had 1 adenoid removed in [place], wonders if remaining adenoid is causing choking with swallowing and globus sensation. She stopped it 2 weeks ago. She decided not to undergo HST.   Last visit we started weekly atorvastatin - she did not take.   Preventative: COLONOSCOPY WITH PROPOFOL 07/22/2016 diverticulosis [name], Darren, MD) Well woman exam -Last normal pap 2014. Aged out. Breast cancer screening -mammo 09/2019  Birads1 @ [facility] Lung cancer screening -notindicated DEXA [id number] T -1.5 hip osteopenia. Good calcium intake and vit D 1000 IU. Discussed weight bearing exercise - limited.  Flu shot -yearly  [place] 02/2019 x2, booster 11/2019 Tetanus shot -declines  prevnar 2015, pneumovax 2013  Shingrix - discussed, declines Advanced directive discussion -does not have available.Planning on seeing lawyer to work on this. Would want daughter [name] to be HCPOA. Handout provided today.  Seat belt use discussed.  Sunscreen use discussed.No changing moles on skin. Non smoker  Alcoholrare- 1/2 cup wine occasionally  Dentist yearly  Eye exam regularly Q4 months - known open angle glaucoma  Bowel - no constipation  Bladder - no incontinence   Lives in [place], from [place]. Daughter lives here. 4daughters Work - retired Data processing manager Diet -avoids sugar Exercise -stationarybike daily at home     Relevant past medical, surgical, family and social history reviewed and updated as indicated. Interim medical history since our last visit reviewed. Allergies and medications reviewed and updated. Outpatient Medications Prior to Visit  Medication Sig Dispense Refill  . ascorbic acid (VITAMIN C) 500 MG tablet Take 1 tablet (500 mg total) by mouth daily.    Marland Kitchen atenolol (TENORMIN)  50 MG tablet TAKE 1 TABLET BY MOUTH  DAILY 90 tablet 3  . Blood Glucose Monitoring Suppl (ONE TOUCH ULTRA SYSTEM KIT) W/DEVICE KIT 1 kit by Does not apply route once. 1 each 0  . Cholecalciferol (VITAMIN D3) 25 MCG (1000 UT) CAPS Take 1 capsule (1,000 Units total) by mouth daily. 30 capsule   . Coenzyme Q10 (COQ10) 100 MG CAPS Take 1 capsule by mouth daily.    . cyanocobalamin (V-R VITAMIN B-12) 500 MCG tablet Take 1 tablet (500 mcg total) by mouth daily.    Marland Kitchen erythromycin ophthalmic ointment SMARTSIG:Sparingly In Eye(s) Twice Daily    . glucose blood (ONETOUCH ULTRA) test strip Use to check blood sugar once daily.  Dx Code E11.42 100 strip 3  . Lancet Device MISC One touch delica - Use as directed 1 each 1  . Lancets (ONETOUCH DELICA PLUS [id number]) MISC CHECK BLOOD SUGAR 1-2 TIMES DAILY AS NEEDED 100 each 3  . Lancets Misc. (ACCU-CHEK SOFTCLIX LANCET DEV) KIT Use at home to test blood sugars daily. 1 kit 0  . levothyroxine (SYNTHROID) 112 MCG tablet TAKE 1 TABLET BY MOUTH  DAILY 90 tablet 0  . losartan (COZAAR) 50 MG tablet TAKE 1 TABLET BY MOUTH  DAILY 90 tablet 0  . montelukast (SINGULAIR) 10 MG tablet TAKE 1 TABLET BY MOUTH  DAILY 90 tablet 0  . omeprazole (PRILOSEC) 40 MG capsule Take 1 capsule (40 mg total) by mouth daily. 90 capsule 3  . atorvastatin (LIPITOR) 20 MG tablet Take 1 tablet (20 mg total) by mouth once a week. (Patient not taking: Reported on 03/31/2020) 12 tablet 3  . levocetirizine (XYZAL) 5 MG tablet Take 1 tablet (5 mg total) by mouth every evening. (Patient not taking: Reported on 03/31/2020) 30 tablet 5   No facility-administered medications prior to visit.     Per HPI unless specifically indicated in ROS section below Review of Systems  Constitutional: Negative for activity change, appetite change, chills, fatigue, fever and unexpected weight change.  HENT: Negative for hearing loss.   Eyes: Negative for visual disturbance.  Respiratory: Negative for cough, chest tightness, shortness of breath and wheezing.   Cardiovascular: Negative for chest pain, palpitations and leg swelling.  Gastrointestinal: Negative for abdominal distention, abdominal pain, blood in stool, constipation, diarrhea, nausea and vomiting.  Genitourinary: Negative for difficulty urinating and hematuria.  Musculoskeletal: Negative for arthralgias, myalgias and neck pain.  Skin: Negative for rash.  Neurological: Negative for dizziness, seizures, syncope and headaches.  Hematological: Negative for adenopathy. Does not bruise/bleed easily.  Psychiatric/Behavioral: Negative for dysphoric mood. The patient is not  nervous/anxious.    Objective:  BP 124/70   Pulse 61   Temp 97.8 F (36.6 C) (Temporal)   Ht 5' 2"  (1.575 m)   Wt 171 lb 4 oz (77.7 kg)   SpO2 98%   BMI 31.32 kg/m   Wt Readings from Last 3 Encounters:  03/31/20 171 lb 4 oz (77.7 kg)  01/17/20 173 lb (78.5 kg)  12/19/19 171 lb 3 oz (77.7 kg)      Physical Exam Vitals and nursing note reviewed.  Constitutional:      General: She is not in acute distress.    Appearance: Normal appearance. She is well-developed and well-nourished. She is not ill-appearing.  HENT:     Head: Normocephalic and atraumatic.     Right Ear: Hearing, tympanic membrane, ear canal and external ear normal.     Left Ear: Hearing, tympanic membrane, ear canal  and external ear normal.     Mouth/Throat:     Mouth: Oropharynx is clear and moist and mucous membranes are normal.     Pharynx: No posterior oropharyngeal edema.  Eyes:     General: No scleral icterus.    Extraocular Movements: Extraocular movements intact and EOM normal.     Conjunctiva/sclera: Conjunctivae normal.     Pupils: Pupils are equal, round, and reactive to light.  Neck:     Thyroid: No thyroid mass or thyromegaly.     Vascular: No carotid bruit.  Cardiovascular:     Rate and Rhythm: Normal rate and regular rhythm.     Pulses: Normal pulses and intact distal pulses.          Radial pulses are 2+ on the right side and 2+ on the left side.     Heart sounds: Normal heart sounds. No murmur heard.   Pulmonary:     Effort: Pulmonary effort is normal. No respiratory distress.     Breath sounds: Normal breath sounds. No wheezing, rhonchi or rales.  Abdominal:     General: Abdomen is flat. Bowel sounds are normal. There is no distension.     Palpations: Abdomen is soft. There is no mass.     Tenderness: There is no abdominal tenderness. There is no guarding or rebound.     Hernia: No hernia is present.  Musculoskeletal:        General: No edema. Normal range of motion.     Cervical  back: Normal range of motion and neck supple.     Right lower leg: No edema.     Left lower leg: No edema.  Lymphadenopathy:     Cervical: No cervical adenopathy.  Skin:    General: Skin is warm and dry.     Findings: No rash.  Neurological:     General: No focal deficit present.     Mental Status: She is alert and oriented to person, place, and time.     Comments: CN grossly intact, station and gait intact Recall 1/3, 1/3 with cue Calculation 5/5 ODNUM  Psychiatric:        Mood and Affect: Mood and affect and mood normal.        Behavior: Behavior normal.        Thought Content: Thought content normal.        Judgment: Judgment normal.       Results for orders placed or performed in visit on 03/28/20  CBC with Differential/Platelet  Result Value Ref Range   WBC 5.3 4.0 - 10.5 K/uL   RBC 4.70 3.87 - 5.11 Mil/uL   Hemoglobin 14.1 12.0 - 15.0 g/dL   HCT 42.9 36.0 - 46.0 %   MCV 91.3 78.0 - 100.0 fl   MCHC 32.8 30.0 - 36.0 g/dL   RDW 14.1 11.5 - 15.5 %   Platelets 205.0 150.0 - 400.0 K/uL   Neutrophils Relative % 53.1 43.0 - 77.0 %   Lymphocytes Relative 35.9 12.0 - 46.0 %   Monocytes Relative 8.4 3.0 - 12.0 %   Eosinophils Relative 2.3 0.0 - 5.0 %   Basophils Relative 0.3 0.0 - 3.0 %   Neutro Abs 2.8 1.4 - 7.7 K/uL   Lymphs Abs 1.9 0.7 - 4.0 K/uL   Monocytes Absolute 0.4 0.1 - 1.0 K/uL   Eosinophils Absolute 0.1 0.0 - 0.7 K/uL   Basophils Absolute 0.0 0.0 - 0.1 K/uL  Vitamin B12  Result Value Ref Range  Vitamin B-12 597 211 - 911 pg/mL  Hemoglobin A1c  Result Value Ref Range   Hgb A1c MFr Bld 6.8 (H) 4.6 - 6.5 %  TSH  Result Value Ref Range   TSH 0.88 0.35 - 4.50 uIU/mL  Comprehensive metabolic panel  Result Value Ref Range   Sodium 141 135 - 145 mEq/L   Potassium 4.5 3.5 - 5.1 mEq/L   Chloride 103 96 - 112 mEq/L   CO2 31 19 - 32 mEq/L   Glucose, Bld 124 (H) 70 - 99 mg/dL   BUN 21 6 - 23 mg/dL   Creatinine, Ser 0.91 0.40 - 1.20 mg/dL   Total Bilirubin 0.7  0.2 - 1.2 mg/dL   Alkaline Phosphatase 63 39 - 117 U/L   AST 19 0 - 37 U/L   ALT 30 0 - 35 U/L   Total Protein 6.9 6.0 - 8.3 g/dL   Albumin 4.2 3.5 - 5.2 g/dL   GFR 58.48 (L) >60.00 mL/min   Calcium 9.4 8.4 - 10.5 mg/dL  Lipid panel  Result Value Ref Range   Cholesterol 127 0 - 200 mg/dL   Triglycerides 70.0 0.0 - 149.0 mg/dL   HDL 50.10 >39.00 mg/dL   VLDL 14.0 0.0 - 40.0 mg/dL   LDL Cholesterol 63 0 - 99 mg/dL   Total CHOL/HDL Ratio 3    NonHDL 76.52    Assessment & Plan:  This visit occurred during the SARS-CoV-2 public health emergency.  Safety protocols were in place, including screening questions prior to the visit, additional usage of staff PPE, and extensive cleaning of exam room while observing appropriate contact time as indicated for disinfecting solutions.   Problem List Items Addressed This Visit    Type 2 diabetes, controlled, with peripheral neuropathy ([place])    Chronic remains diet controlled.  She did not start lipitor - again reviewed reasons to start statin in diabetic and rec try weekly dosing. = RTC 6 mo DM f/u visit.        Osteopenia    Overdue for DEXA - # provided to call and schedule. Reordered.       Relevant Orders   DG Bone Density   Non-restorative sleep    Previously endorsed parox noct dyspnea, snoring, daytime somnolence and intermittent dyspnea, PAH on echo. Needs HST for further evaluation of OSA - discussed.       Medicare annual wellness visit, subsequent - Primary    I have personally reviewed the Medicare Annual Wellness questionnaire and have noted 1. The patient's medical and social history 2. Their use of alcohol, tobacco or illicit drugs 3. Their current medications and supplements 4. The patient's functional ability including ADL's, fall risks, home safety risks and hearing or visual impairment. Cognitive function has been assessed and addressed as indicated.  5. Diet and physical activity 6. Evidence for depression or mood  disorders The patients weight, height, BMI have been recorded in the chart. I have made referrals, counseling and provided education to the patient based on review of the above and I have provided the pt with a written personalized care plan for preventive services. Provider list updated.. See scanned questionairre as needed for further documentation. Reviewed preventative protocols and updated unless pt declined.       Low vitamin B12 level    Good levels on 576mg daily replacement.       Hypothyroidism    Chronic, stable on current regimen - continue levothyroxine.       Hypertension  Chronic, stable on atenolol and losartan - continue.       Health maintenance examination    Preventative protocols reviewed and updated unless pt declined. Discussed healthy diet and lifestyle.       Glaucoma    Regularly sees eye doctor       Relevant Medications   erythromycin ophthalmic ointment   Chronic cough    Appreciate pulm care. Encouraged she undergo evaluation - PFTs and HST.       Bilateral hearing loss    Progressive bilateral hearing loss noted to higher frequencies on hearing screen - refer to audiologist.       Relevant Orders   Ambulatory referral to Audiology   Allergic rhinitis    Continue xyzal, nasal saline, singulair.       Advanced care planning/counseling discussion    Advanced directive discussion -does not have available.Planning on seeing lawyer to work on this. Would want daughter [name] to be HCPOA. Handout provided today.           No orders of the defined types were placed in this encounter.  Orders Placed This Encounter  Procedures  . DG Bone Density    Standing Status:   Future    Standing Expiration Date:   03/31/2021    Order Specific Question:   Reason for Exam (SYMPTOM  OR DIAGNOSIS REQUIRED)    Answer:   osteopenia    Order Specific Question:   Preferred imaging location?    Answer:   [facility]  . Ambulatory referral to  Audiology    Referral Priority:   Routine    Referral Type:   Audiology Exam    Referral Reason:   Specialty Services Required    Number of Visits Requested:   1    Patient instructions: Comienze de nuevo lipitor 1 vez a la semana, me deja saber si no lo tolera.  Llame a [place] at [facility] [phone] para hacer cita para densitometria.  Considere vacuna contra shingles - revise en farmacia sobre el precio.  Advanced directive packet provided today.  La remitiremos a Acupuncturist. Regresar en 6 meses par paroxima visita y revision de diabetes.  Follow up plan: Return in about 6 months (around 09/28/2020) for follow up visit.  Ria Bush, MD

## 2020-03-31 NOTE — Assessment & Plan Note (Addendum)
Advanced directive discussion - does not have available. Planning on seeing lawyer to work on this. Would want daughter [name] to be HCPOA. Handout provided today.  ?

## 2020-03-31 NOTE — Patient Instructions (Addendum)
Comienze de nuevo lipitor 1 vez a la semana, me deja saber si no lo tolera.  Llame a [place] at [facility] [phone] para hacer cita para densitometria.  Considere vacuna contra shingles - revise en farmacia sobre el precio.  Advanced directive packet provided today.  La remitiremos a Acupuncturist. Regresar en 6 meses par paroxima visita y revision de diabetes.  Mantenimiento de la salud despus de los 18 aos de edad Health Maintenance After Age 41 Despus de los 65 aos de edad, corre un riesgo mayor de Tourist information centre manager enfermedades e infecciones a Barrister's clerk, como tambin de sufrir lesiones por cadas. Las cadas son la causa principal de las fracturas de huesos y lesiones en la cabeza de personas mayores de 90 aos de edad. Recibir cuidados preventivos de forma regular puede ayudarlo a mantenerse saludable y en buen [place]. Los cuidados preventivos incluyen realizarse anlisis de forma regular y Actor en el estilo de vida segn las recomendaciones del mdico. Converse con el profesional que lo asiste sobre:  Las pruebas de deteccin y los anlisis que debe Dispensing optician. Una prueba de deteccin es un estudio que se para Hydrographic surveyor la presencia de una enfermedad cuando no tiene sntomas.  Un plan de dieta y ejercicios adecuado para usted. Qu debo saber sobre las pruebas de deteccin y los anlisis para prevenir cadas? Realizarse pruebas de deteccin y [employer] es la mejor manera de Hydrographic surveyor un problema de salud de forma temprana. El diagnstico y tratamiento tempranos le brindan la mejor oportunidad de Chief Technology Officer las afecciones mdicas que son comunes despus de los 17 aos de edad. Ciertas afecciones y elecciones de estilo de vida pueden hacer que sea ms propenso a sufrir Engineer, manufacturing. El mdico puede recomendarle lo siguiente:  Controles regulares de la visin. Una visin deficiente y afecciones como las cataratas pueden hacer que sea ms propenso a sufrir Engineer, manufacturing. Si [place]  lentes, asegrese de obtener una receta actualizada si su visin cambia.  Revisin de medicamentos. Revise regularmente con el mdico todos los medicamentos que toma, incluidos los medicamentos de [place]. Consulte al [employer] efectos secundarios que pueden hacer que sea ms propenso a sufrir Engineer, manufacturing. Informe al mdico si alguno de los medicamentos que toma lo hace sentir mareado o somnoliento.  Pruebas de deteccin para la osteoporosis. La osteoporosis es una afeccin que hace que los huesos se vuelvan ms frgiles. En consecuencia, los huesos pueden debilitarse y quebrarse ms fcilmente.  Pruebas de deteccin para la presin arterial. Los cambios en la presin arterial y los medicamentos para Chief Technology Officer la presin arterial pueden hacerlo sentir mareado.  Controles de fuerza y equilibrio. El mdico puede recomendar ciertos estudios para controlar su fuerza y equilibrio al estar de pie, al caminar o al cambiar de posicin.  Examen de los pies. El dolor y Chiropractor en los pies, como tambin no utilizar el calzado [place], pueden hacer que sea ms propenso a sufrir Engineer, manufacturing.  Prueba de deteccin de la depresin. Es ms probable que sufra una cada si tiene miedo a caerse, se siente mal emocionalmente o se siente incapaz de Patent examiner.  Prueba de deteccin de consumo de alcohol. Beber demasiado alcohol puede afectar su equilibrio y puede hacer que sea ms propenso a sufrir Engineer, manufacturing. Qu medidas puedo tomar para reducir mi riesgo de sufrir una cada? Instrucciones generales  Hable con el mdico sobre sus riesgos de sufrir una cada. Infrmele a su mdico si: ?  Se cae. Asegrese de informarle a su mdico acerca de todas las cadas, incluso aquellas que parecen ser [employer] ? Se siente mareado, somnoliento o que pierde el equilibrio.  Tome los medicamentos de venta libre y los recetados solamente como se lo haya indicado el mdico. Estos incluyen  todos los suplementos.  Siga una dieta sana y [place] un peso saludable. Una dieta saludable incluye productos lcteos descremados, carnes bajas en contenido de grasa ([place], [place] de granos enteros, frijoles y [place] frutas y verduras. La seguridad en el hogar  Retire los objetos que puedan causar tropiezos tales como alfombras, cables u obstculos.  Instale equipos de seguridad, como barras para sostn en los baos y barandas de seguridad en las escaleras.  [place] habitaciones y los pasillos bien iluminados. Actividad  Siga un programa de ejercicio regular para mantenerse en forma. Esto lo ayudar a Contractor equilibrio. Consulte al mdico qu tipos de ejercicios son adecuados para usted.  Si necesita un bastn o un andador, selo segn las recomendaciones del mdico.  Utilice calzado con buen apoyo y suela antideslizante.   Estilo de vida  No beba alcohol si el mdico le indica que no beba.  Si bebe alcohol, limite la cantidad que consume: ? De 0 a 1 medida por da para las mujeres. ? De 0 a 2 medidas por da para los hombres.  Est atento a la cantidad de alcohol que contiene su bebida. En los [place] una medida equivale a una botella tpica de cerveza (12 onzas), media copa de vino (5 onzas) o una medida de bebida blanca (1 onza).  No consuma ningn producto que contenga nicotina o tabaco, como cigarrillos y Psychologist, sport and exercise. Si necesita ayuda para dejar de fumar, consulte al mdico. Resumen  Tener un estilo de vida saludable y recibir cuidados preventivos pueden ayudar a Theatre stage manager salud y el bienestar despus de los 89 aos de [place].  Realizarse pruebas de deteccin y [employer] es la mejor manera de Hydrographic surveyor un problema de salud de forma temprana y Lourena Simmonds a Product/process development scientist una cada. El diagnstico y tratamiento tempranos le brindan la mejor oportunidad de Chief Technology Officer las afecciones mdicas ms comunes en las personas mayores de 45 aos de edad.  Las cadas son la  causa principal de las fracturas de huesos y lesiones en la cabeza de personas mayores de 37 aos de edad. Tome precauciones para evitar una cada en su casa.  Trabaje con el mdico para saber qu cambios que puede hacer para mejorar su salud y [place], y [place]. Esta informacin no tiene Marine scientist el consejo del mdico. Asegrese de hacerle al mdico cualquier pregunta que tenga. Document Revised: 03/17/2017 Document Reviewed: 03/17/2017 Elsevier Patient Education  2021 [employer].

## 2020-03-31 NOTE — Assessment & Plan Note (Signed)

## 2020-04-03 ENCOUNTER — Encounter: Payer: Self-pay | Admitting: Family Medicine

## 2020-04-03 DIAGNOSIS — H9193 Unspecified hearing loss, bilateral: Secondary | ICD-10-CM | POA: Insufficient documentation

## 2020-04-03 NOTE — Assessment & Plan Note (Signed)
Chronic, stable on current regimen - continue levothyroxine.

## 2020-04-03 NOTE — Assessment & Plan Note (Signed)
Regularly sees eye doctor.  

## 2020-04-03 NOTE — Assessment & Plan Note (Signed)
Chronic, stable on atenolol and losartan - continue.

## 2020-04-03 NOTE — Assessment & Plan Note (Addendum)
Chronic remains diet controlled.  She did not start lipitor - again reviewed reasons to start statin in diabetic and rec try weekly dosing. = RTC 6 mo DM f/u visit.

## 2020-04-03 NOTE — Assessment & Plan Note (Signed)
Appreciate pulm care. Encouraged she undergo evaluation - PFTs and HST.

## 2020-04-03 NOTE — Assessment & Plan Note (Signed)
Preventative protocols reviewed and updated unless pt declined. Discussed healthy diet and lifestyle.  

## 2020-04-03 NOTE — Assessment & Plan Note (Deleted)
Needs HST for further evaluation of OSA - discussed.

## 2020-04-03 NOTE — Assessment & Plan Note (Signed)
Overdue for DEXA - # provided to call and schedule. Reordered.

## 2020-04-03 NOTE — Assessment & Plan Note (Addendum)
Progressive bilateral hearing loss noted to higher frequencies on hearing screen - refer to audiologist.

## 2020-04-03 NOTE — Assessment & Plan Note (Signed)
Continue xyzal, nasal saline, singulair.

## 2020-04-03 NOTE — Assessment & Plan Note (Signed)
Good levels on 546mcg daily replacement.

## 2020-04-03 NOTE — Assessment & Plan Note (Addendum)
Previously endorsed parox noct dyspnea, snoring, daytime somnolence and intermittent dyspnea, PAH on echo. Needs HST for further evaluation of OSA - discussed.

## 2020-04-08 ENCOUNTER — Other Ambulatory Visit: Payer: Self-pay | Admitting: Family Medicine

## 2020-04-13 ENCOUNTER — Other Ambulatory Visit: Payer: Self-pay | Admitting: Family Medicine

## 2020-05-07 DIAGNOSIS — Z872 Personal history of diseases of the skin and subcutaneous tissue: Secondary | ICD-10-CM | POA: Diagnosis not present

## 2020-05-07 DIAGNOSIS — Z86018 Personal history of other benign neoplasm: Secondary | ICD-10-CM | POA: Diagnosis not present

## 2020-05-07 DIAGNOSIS — L718 Other rosacea: Secondary | ICD-10-CM | POA: Diagnosis not present

## 2020-05-07 DIAGNOSIS — L578 Other skin changes due to chronic exposure to nonionizing radiation: Secondary | ICD-10-CM | POA: Diagnosis not present

## 2020-05-07 DIAGNOSIS — L821 Other seborrheic keratosis: Secondary | ICD-10-CM | POA: Diagnosis not present

## 2020-05-07 DIAGNOSIS — L300 Nummular dermatitis: Secondary | ICD-10-CM | POA: Diagnosis not present

## 2020-06-03 ENCOUNTER — Telehealth: Payer: Self-pay

## 2020-06-03 ENCOUNTER — Ambulatory Visit
Admission: EM | Admit: 2020-06-03 | Discharge: 2020-06-03 | Disposition: A | Discharge status: Home / Self Care | Payer: Medicare Other | Attending: Emergency Medicine | Admitting: Emergency Medicine

## 2020-06-03 ENCOUNTER — Other Ambulatory Visit: Payer: Self-pay

## 2020-06-03 DIAGNOSIS — I10 Essential (primary) hypertension: Secondary | ICD-10-CM | POA: Diagnosis not present

## 2020-06-03 DIAGNOSIS — H8301 Labyrinthitis, right ear: Secondary | ICD-10-CM | POA: Diagnosis not present

## 2020-06-03 DIAGNOSIS — R739 Hyperglycemia, unspecified: Secondary | ICD-10-CM | POA: Diagnosis not present

## 2020-06-03 DIAGNOSIS — H6501 Acute serous otitis media, right ear: Secondary | ICD-10-CM | POA: Insufficient documentation

## 2020-06-03 LAB — GLUCOSE, CAPILLARY: Glucose-Capillary: 170 mg/dL — ABNORMAL HIGH (ref 70–99)

## 2020-06-03 MED ORDER — MECLIZINE HCL 12.5 MG PO TABS: 12.5000 mg | ORAL_TABLET | Freq: Three times a day (TID) | ORAL | 0 refills | Status: DC | PRN

## 2020-06-03 NOTE — Telephone Encounter (Signed)
On 06/01/20 pt started with on and off dizziness where things appear to be moving around and when pt walks she goes from side to side. This morning has been the worse and pt states she feels "bad". No H/A, CP or SOB.  Earlier pts BS was 102 and BP 158/80 pt does not know what heart rate was. Pt has never had before. No new meds or diet. No available appts at [facility] or [facility] and pt wants to go to [facility] in [place]. Someone is with the pt. Sending note to DR G.

## 2020-06-03 NOTE — Discharge Instructions (Addendum)
Avoid eating too much bread. Max one slice a day Cut down your carbs like pasta, rice, potatoes to 1/4 cup per serving  You may come back to see me and bring blood pressure diaries and glucose on Thursday this week if you cant get in to see your primary care doctor.

## 2020-06-03 NOTE — ED Provider Notes (Signed)
[facility]    CSN: [id number] Arrival date & time: 06/03/20  1031      History   Chief Complaint Chief Complaint  Patient presents with  . Dizziness    HPI [name] is a 86 y.o. female who presents due to feeling off balance since this am. She gets weak and strange feeling if her glucose drops to 120, or gets to 170. She also has felt a little off balance when she gets up from laying to sitting or stands. Her R ear has hurt off and on felt full.  Denies URI or allergy symptom, fever, chills, sweats, chest pains, HA or SOB Her PCP is on vacation this week and cant get in to see him til August. She is not on medications for DM. Eats anything she wants, but not much sweets.   Past Medical History:  Diagnosis Date  . Allergy    hay fever  . Arthritis   . Cataract    resolved with surgery  . Chicken pox   . Colon polyp   . Diabetes mellitus 2008  . Generalized headaches    thinks caused by glaucoma  . Glaucoma   . Hypertension   . Right sided sciatica 07/03/2013  . Thyroid disease    hypothyroidism  . Urinary incontinence   . UTI (urinary tract infection)     Patient Active Problem List   Diagnosis Date Noted  . Bilateral hearing loss 04/03/2020  . Nasal sinus congestion 11/14/2019  . Syncope and collapse 11/14/2019  . Non-restorative sleep 11/14/2019  . Rosacea 11/14/2019  . Shoulder strain, right, initial encounter 08/17/2019  . Varicose veins of right lower extremity with inflammation 02/15/2019  . Lumbar back pain with radiculopathy affecting right lower extremity 02/02/2018  . Chronic cough 09/20/2017  . Lesion of lip 05/19/2017  . Right lower quadrant abdominal pain 04/21/2017  . Lactose intolerance 04/21/2017  . Fatty liver disease, nonalcoholic 75/64/3329  . Abdominal discomfort, epigastric 11/26/2016  . Low vitamin B12 level 06/24/2016  . Thoracic radiculitis 06/04/2016  . Advanced care planning/counseling discussion 12/25/2015  .  Arthralgia 04/17/2015  . Abnormal CXR 01/17/2015  . Health maintenance examination 01/02/2015  . Snoring 01/02/2015  . Leg cramps 12/27/2013  . Skin growth 09/25/2013  . Allergic rhinitis 03/23/2013  . History of Helicobacter pylori infection 01/15/2013  . Medicare annual wellness visit, subsequent 09/14/2012  . Osteopenia 06/01/2012  . Hypertension 11/29/2011  . Glaucoma 11/29/2011  . Type 2 diabetes, controlled, with peripheral neuropathy ([place]) 07/22/2011  . Hypothyroidism 07/22/2011    Past Surgical History:  Procedure Laterality Date  . APPENDECTOMY  1998  . BUNIONECTOMY    . CHOLECYSTECTOMY    . COLONOSCOPY  2007   int hem, poor bowel prep, normal barium enema in following (Competiello)  . COLONOSCOPY WITH PROPOFOL N/A 07/22/2016   diverticulosis Allen Norris, Darren, MD)  . EYE SURGERY     R  eye-lid drop surgery  . TONSILLECTOMY      OB History   No obstetric history on file.      Home Medications    Prior to Admission medications   Medication Sig Start Date End Date Taking? Authorizing Provider  meclizine (ANTIVERT) 12.5 MG tablet Take 1 tablet (12.5 mg total) by mouth 3 (three) times daily as needed for dizziness. 06/03/20  Yes Rodriguez-Southworth, [name], PA-C  ascorbic acid (VITAMIN C) 500 MG tablet Take 1 tablet (500 mg total) by mouth daily. 02/14/19   Ria Bush, MD  atenolol (TENORMIN) 50 MG tablet TAKE 1 TABLET BY MOUTH  DAILY 04/09/20   Ria Bush, MD  Blood Glucose Monitoring Suppl (ONE TOUCH ULTRA SYSTEM KIT) W/DEVICE KIT 1 kit by Does not apply route once. 12/27/13   Jackolyn Confer, MD  Cholecalciferol (VITAMIN D3) 25 MCG (1000 UT) CAPS Take 1 capsule (1,000 Units total) by mouth daily. 02/14/19   Ria Bush, MD  Coenzyme Q10 (COQ10) 100 MG CAPS Take 1 capsule by mouth daily.    [provider]  cyanocobalamin (V-R VITAMIN B-12) 500 MCG tablet Take 1 tablet (500 mcg total) by mouth daily. 06/24/16   Ria Bush, MD   erythromycin ophthalmic ointment SMARTSIG:Sparingly In Eye(s) Twice Daily 03/26/20   [provider]  glucose blood (ONETOUCH ULTRA) test strip Use to check blood sugar once daily. Dx Code B70.48 02/11/20   Ria Bush, MD  Lancet Device MISC One touch Donaciano Eva - Use as directed 06/24/16   Ria Bush, MD  Lancets [facility] DELICA PLUS [id number]) [place] 1-2 TIMES DAILY AS NEEDED 12/24/19   Ria Bush, MD  Lancets Misc. (ACCU-CHEK SOFTCLIX LANCET DEV) KIT Use at home to test blood sugars daily. 06/24/16   Ria Bush, MD  levothyroxine (SYNTHROID) 112 MCG tablet TAKE 1 TABLET BY MOUTH  DAILY 03/28/20   Ria Bush, MD  losartan (COZAAR) 50 MG tablet TAKE 1 TABLET BY MOUTH  DAILY 01/25/20   Ria Bush, MD  montelukast (SINGULAIR) 10 MG tablet TAKE 1 TABLET BY MOUTH  DAILY 04/14/20   Ria Bush, MD  omeprazole (PRILOSEC) 40 MG capsule Take 1 capsule (40 mg total) by mouth daily. 12/19/19   Ria Bush, MD  atorvastatin (LIPITOR) 20 MG tablet Take 1 tablet (20 mg total) by mouth once a week. Patient not taking: Reported on 03/31/2020 12/19/19 06/03/20  Ria Bush, MD  levocetirizine (XYZAL) 5 MG tablet Take 1 tablet (5 mg total) by mouth every evening. Patient not taking: No sig reported 01/17/20 06/03/20  Chesley Mires, MD    Family History Family History  Problem Relation Age of Onset  . Heart disease Mother   . Asthma Mother   . Heart attack Mother 89       aneurysm ruptured by heart  . Heart disease Father   . Deep vein thrombosis Father   . Leukemia Brother   . Cancer Brother   . Diabetes Sister   . Crohn's disease Brother   . Diabetes Brother   . Arthritis Other   . Diabetes Other   . Cancer Maternal Aunt        ovarian?  . Cancer Maternal Grandfather        colon  . Breast cancer Neg Hx     Social History Social History   Tobacco Use  . Smoking status: Never Smoker  . Smokeless tobacco: Never Used   Vaping Use  . Vaping Use: Never used  Substance Use Topics  . Alcohol use: Not Currently    Alcohol/week: 0.0 standard drinks    Comment: occasional  . Drug use: No     Allergies   Align [acidophilus], Augmentin [amoxicillin-pot clavulanate], Cefprozil, Metformin and related, and Timolol   Review of Systems Review of Systems  Constitutional: Negative for diaphoresis and unexpected weight change.  HENT: Negative for tinnitus.   Eyes: Negative for visual disturbance.  Respiratory: Negative for chest tightness and shortness of breath.   Cardiovascular: Negative for chest pain, palpitations and leg swelling.  Gastrointestinal: Negative for constipation,  diarrhea and nausea.  Endocrine: + for elevated sugars. Negative for polydipsia, polyphagia and polyuria.  Genitourinary: Negative for dysuria and frequency.  Skin: Negative for rash and wound.  Neurological: Negative for speech difficulty, weakness, numbness and headaches. + for dizziness   Physical Exam Triage Vital Signs ED Triage Vitals  Enc Vitals Group     BP 06/03/20 1045 (!) 178/88     Pulse Rate 06/03/20 1045 68     Resp 06/03/20 1045 16     Temp 06/03/20 1045 97.8 F (36.6 C)     Temp Source 06/03/20 1045 Oral     SpO2 06/03/20 1045 97 %     Weight 06/03/20 1042 166 lb (75.3 kg)     Height --      Head Circumference --      Peak Flow --      Pain Score 06/03/20 1042 0     Pain Loc --      Pain Edu? --      Excl. in [place]? --    No data found.  Updated Vital Signs BP (S) (!) 178/88 (BP Location: Right Arm) Comment: Silvia, PA notified  Pulse 68   Temp 97.8 F (36.6 C) (Oral)   Resp 16   Wt 166 lb (75.3 kg)   SpO2 97%   BMI 30.36 kg/m   Visual Acuity Right Eye Distance:   Left Eye Distance:   Bilateral Distance:    Right Eye Near:   Left Eye Near:    Bilateral Near:     Physical Exam  Constitutional: She is oriented to person, place, and time. She appears well-developed and well-nourished. No  distress.  HENT:  Head: Normocephalic and atraumatic.  Right Ear: External ear normal. R TM is gray and dull Left Ear: External ear normal. TM is normal Nose: Nose normal.  Eyes: Conjunctivae are normal. Right eye exhibits no discharge. Left eye exhibits no discharge. No scleral icterus.  Neck: Neck supple. No thyromegaly present.  No carotid bruits bilaterally  Cardiovascular: Normal rate and regular rhythm.  No murmur heard. Pulmonary/Chest: Effort normal and breath sounds normal. No respiratory distress.  Musculoskeletal: Normal range of motion. She exhibits no edema.  Lymphadenopathy:    She has no cervical adenopathy.  Neurological: She is alert and oriented to person, place, and time.  Skin: Skin is warm and dry. Capillary refill takes less than 2 seconds. No rash noted. She is not diaphoretic.  Psychiatric: She has a normal mood and affect. Her behavior is normal. Judgment and thought content normal.  Nursing note reviewed. NEURO- normal finger to nose, Romberg and tandem gait.   UC Treatments / Results  Labs (all labs ordered are listed, but only abnormal results are displayed) Labs Reviewed  GLUCOSE, CAPILLARY - Abnormal; Notable for the following components:      Result Value   Glucose-Capillary 170 (*)    All other components within normal limits  CBG MONITORING, ED    EKG  NSR and unchanged when compared to EKG from 01/27/2018  Radiology No results found.  Procedures Procedures (including critical care time)  Medications Ordered in UC Medications - No data to display  Initial Impression / Assessment and Plan / UC Course  I have reviewed the triage vital signs and the nursing notes. Pertinent labs  results that were available during my care of the patient were reviewed by me and considered in my medical decision making (see chart for details). HTN poorly controlled and may  just be her elevated glucose, so will do diaries and Fu with PCP if they are still >+  140/90 I educated her how to cut out carbs which she did not know to avoid them being a diabetic and will do glucose diaries.  If she is unable to see her PCP this week, she may come see me and I may have to increase her dose of Losartan if BP is still elevated.    Final Clinical Impressions(s) / UC Diagnoses   Final diagnoses:  Labyrinthitis of right ear  Non-recurrent acute serous otitis media of right ear  Primary hypertension  Hyperglycemia     Discharge Instructions     Avoid eating too much bread. Max one slice a day Cut down your carbs like pasta, rice, potatoes to 1/4 cup per serving  You may come back to see me and bring blood pressure diaries and glucose on Thursday this week if you cant get in to see your primary care doctor.     ED Prescriptions    Medication Sig Dispense Auth. Provider   meclizine (ANTIVERT) 12.5 MG tablet Take 1 tablet (12.5 mg total) by mouth 3 (three) times daily as needed for dizziness. 21 tablet Rodriguez-Southworth, [name], PA-C     PDMP not reviewed this encounter.   Shelby Mattocks, [place] 06/06/20 1312

## 2020-06-03 NOTE — Telephone Encounter (Signed)
Noted. plz call later today for an update on symptoms.

## 2020-06-03 NOTE — Telephone Encounter (Signed)
Lvm asking pt to call back.  Need an update on pt.

## 2020-06-03 NOTE — ED Triage Notes (Signed)
Patient presents to Urgent Care with complaints of dizziness she states she does have a hx of HTN and DM. She states she is on BP meds and her BP ranging in the 150's which is higher than her normal. She also reports her BG has been ranging in the 90-120's which is low for her. She states this morning it was in the 120's and she drunk a cup of OJ to get it up to 150's.   Denies SOB, fever, chest pain, n/v, or diarrhea, or changes in vision.

## 2020-06-04 ENCOUNTER — Telehealth: Payer: Self-pay | Admitting: Family Medicine

## 2020-06-04 NOTE — Telephone Encounter (Signed)
Pt returned your call... pt did go to UC yesterday and an UC f/u scheduled with Dr Silvio Pate on Friday as Dr Darnell Level is out of the office

## 2020-06-04 NOTE — Chronic Care Management (AMB) (Signed)
  Chronic Care Management   Outreach Note  06/04/2020 Name: [name] MRN: [id number] DOB: [birth year]  Referred by: Ria Bush, MD Reason for referral : No chief complaint on file.   An unsuccessful telephone outreach was attempted today. The patient was referred to the pharmacist for assistance with care management and care coordination.   Follow Up Plan:   Lauretta Grill [employer] Scheduler

## 2020-06-05 ENCOUNTER — Ambulatory Visit
Admission: EM | Admit: 2020-06-05 | Discharge: 2020-06-05 | Disposition: A | Discharge status: Home / Self Care | Payer: Medicare Other

## 2020-06-05 ENCOUNTER — Encounter: Payer: Self-pay | Admitting: Emergency Medicine

## 2020-06-05 ENCOUNTER — Other Ambulatory Visit: Payer: Self-pay

## 2020-06-05 DIAGNOSIS — R739 Hyperglycemia, unspecified: Secondary | ICD-10-CM | POA: Diagnosis not present

## 2020-06-05 DIAGNOSIS — H8301 Labyrinthitis, right ear: Secondary | ICD-10-CM

## 2020-06-05 DIAGNOSIS — I10 Essential (primary) hypertension: Secondary | ICD-10-CM | POA: Diagnosis not present

## 2020-06-05 NOTE — ED Triage Notes (Signed)
Patient states she is here today for f/u from her her visit on 04/19 due to elevated BP.

## 2020-06-05 NOTE — Discharge Instructions (Signed)
Take one more 50 mg Losartan today, then tomorrow am start taking 100 mg ( 2 of them a day) and keep the appointment with the doctor at your family physician place to see if they want to keep you at 100 mg dose from now on.  Keep working on your sugar diaries and your diet. Fasting glucose should be less than 100.

## 2020-06-05 NOTE — ED Provider Notes (Signed)
[facility]    CSN: [id number] Arrival date & time: 06/05/20  1033      History   Chief Complaint Chief Complaint  Patient presents with  . Hypertension    HPI [name] is a 86 y.o. female who presents due to her BP still being elevated. But has cut down her carbs and her glucose yesterday was 117 and this am was 118. The dizziness is better with the Meclizine.     Past Medical History:  Diagnosis Date  . Allergy    hay fever  . Arthritis   . Cataract    resolved with surgery  . Chicken pox   . Colon polyp   . Diabetes mellitus 2008  . Generalized headaches    thinks caused by glaucoma  . Glaucoma   . Hypertension   . Right sided sciatica 07/03/2013  . Thyroid disease    hypothyroidism  . Urinary incontinence   . UTI (urinary tract infection)     Patient Active Problem List   Diagnosis Date Noted  . Bilateral hearing loss 04/03/2020  . Nasal sinus congestion 11/14/2019  . Syncope and collapse 11/14/2019  . Non-restorative sleep 11/14/2019  . Rosacea 11/14/2019  . Shoulder strain, right, initial encounter 08/17/2019  . Varicose veins of right lower extremity with inflammation 02/15/2019  . Lumbar back pain with radiculopathy affecting right lower extremity 02/02/2018  . Chronic cough 09/20/2017  . Lesion of lip 05/19/2017  . Right lower quadrant abdominal pain 04/21/2017  . Lactose intolerance 04/21/2017  . Fatty liver disease, nonalcoholic 79/04/8331  . Abdominal discomfort, epigastric 11/26/2016  . Low vitamin B12 level 06/24/2016  . Thoracic radiculitis 06/04/2016  . Advanced care planning/counseling discussion 12/25/2015  . Arthralgia 04/17/2015  . Abnormal CXR 01/17/2015  . Health maintenance examination 01/02/2015  . Snoring 01/02/2015  . Leg cramps 12/27/2013  . Skin growth 09/25/2013  . Allergic rhinitis 03/23/2013  . History of Helicobacter pylori infection 01/15/2013  . Medicare annual wellness visit, subsequent 09/14/2012   . Osteopenia 06/01/2012  . Hypertension 11/29/2011  . Glaucoma 11/29/2011  . Type 2 diabetes, controlled, with peripheral neuropathy ([place]) 07/22/2011  . Hypothyroidism 07/22/2011    Past Surgical History:  Procedure Laterality Date  . APPENDECTOMY  1998  . BUNIONECTOMY    . CHOLECYSTECTOMY    . COLONOSCOPY  2007   int hem, poor bowel prep, normal barium enema in following (Competiello)  . COLONOSCOPY WITH PROPOFOL N/A 07/22/2016   diverticulosis Allen Norris, Darren, MD)  . EYE SURGERY     R  eye-lid drop surgery  . TONSILLECTOMY      OB History   No obstetric history on file.      Home Medications    Prior to Admission medications   Medication Sig Start Date End Date Taking? Authorizing Provider  ascorbic acid (VITAMIN C) 500 MG tablet Take 1 tablet (500 mg total) by mouth daily. 02/14/19  Yes Ria Bush, MD  atenolol (TENORMIN) 50 MG tablet TAKE 1 TABLET BY MOUTH  DAILY 04/09/20  Yes Ria Bush, MD  Blood Glucose Monitoring Suppl (ONE TOUCH ULTRA SYSTEM KIT) W/DEVICE KIT 1 kit by Does not apply route once. 12/27/13  Yes Jackolyn Confer, MD  Cholecalciferol (VITAMIN D3) 25 MCG (1000 UT) CAPS Take 1 capsule (1,000 Units total) by mouth daily. 02/14/19  Yes Ria Bush, MD  Coenzyme Q10 (COQ10) 100 MG CAPS Take 1 capsule by mouth daily.   Yes [provider]  cyanocobalamin (V-R  VITAMIN B-12) 500 MCG tablet Take 1 tablet (500 mcg total) by mouth daily. 06/24/16  Yes Ria Bush, MD  erythromycin ophthalmic ointment SMARTSIG:Sparingly In Eye(s) Twice Daily 03/26/20  Yes [provider]  glucose blood (ONETOUCH ULTRA) test strip Use to check blood sugar once daily. Dx Code C58.85 02/11/20  Yes Ria Bush, MD  Lancet Device MISC One touch Donaciano Eva - Use as directed 06/24/16  Yes Ria Bush, MD  Lancets [facility] DELICA PLUS [id number]) MISC CHECK BLOOD SUGAR 1-2 TIMES DAILY AS NEEDED 12/24/19  Yes Ria Bush, MD  Lancets Misc.  (ACCU-CHEK SOFTCLIX LANCET DEV) KIT Use at home to test blood sugars daily. 06/24/16  Yes Ria Bush, MD  levothyroxine (SYNTHROID) 112 MCG tablet TAKE 1 TABLET BY MOUTH  DAILY 03/28/20  Yes Ria Bush, MD  losartan (COZAAR) 50 MG tablet TAKE 1 TABLET BY MOUTH  DAILY 01/25/20  Yes Ria Bush, MD  meclizine (ANTIVERT) 12.5 MG tablet Take 1 tablet (12.5 mg total) by mouth 3 (three) times daily as needed for dizziness. 06/03/20  Yes Rodriguez-Southworth, [name], PA-C  montelukast (SINGULAIR) 10 MG tablet TAKE 1 TABLET BY MOUTH  DAILY 04/14/20  Yes Ria Bush, MD  omeprazole (PRILOSEC) 40 MG capsule Take 1 capsule (40 mg total) by mouth daily. 12/19/19  Yes Ria Bush, MD  atorvastatin (LIPITOR) 20 MG tablet Take 1 tablet (20 mg total) by mouth once a week. Patient not taking: Reported on 03/31/2020 12/19/19 06/03/20  Ria Bush, MD  levocetirizine (XYZAL) 5 MG tablet Take 1 tablet (5 mg total) by mouth every evening. Patient not taking: No sig reported 01/17/20 06/03/20  Chesley Mires, MD    Family History Family History  Problem Relation Age of Onset  . Heart disease Mother   . Asthma Mother   . Heart attack Mother 28       aneurysm ruptured by heart  . Heart disease Father   . Deep vein thrombosis Father   . Leukemia Brother   . Cancer Brother   . Diabetes Sister   . Crohn's disease Brother   . Diabetes Brother   . Arthritis Other   . Diabetes Other   . Cancer Maternal Aunt        ovarian?  . Cancer Maternal Grandfather        colon  . Breast cancer Neg Hx     Social History Social History   Tobacco Use  . Smoking status: Never Smoker  . Smokeless tobacco: Never Used  Vaping Use  . Vaping Use: Never used  Substance Use Topics  . Alcohol use: Not Currently    Alcohol/week: 0.0 standard drinks    Comment: occasional  . Drug use: No     Allergies   Align [acidophilus], Augmentin [amoxicillin-pot clavulanate], Cefprozil, Metformin and  related, and Timolol   Review of Systems Review of Systems Review of Systems  Constitutional: Negative for diaphoresis and unexpected weight change.  HENT: Negative for tinnitus.   Eyes: Negative for visual disturbance.  Respiratory: Negative for chest tightness and shortness of breath.   Cardiovascular: Negative for chest pain, palpitations and leg swelling.  Gastrointestinal: Negative for constipation, diarrhea and nausea.  Endocrine: Negative for polydipsia, polyphagia and polyuria.  Genitourinary: Negative for dysuria and frequency.  Skin: Negative for rash and wound.  Neurological: Negative for dizziness, speech difficulty, weakness, numbness and headaches.    Physical Exam Triage Vital Signs ED Triage Vitals  Enc Vitals Group     BP 06/05/20 1122 (!) 159/77  Pulse Rate 06/05/20 1122 (!) 57     Resp 06/05/20 1122 18     Temp 06/05/20 1122 98.2 F (36.8 C)     Temp Source 06/05/20 1122 Oral     SpO2 06/05/20 1122 98 %     Weight 06/05/20 1119 166 lb 0.1 oz (75.3 kg)     Height 06/05/20 1119 _0  (1.575 m)     Head Circumference --      Peak Flow --      Pain Score --      Pain Loc --      Pain Edu? --      Excl. in [place]? --    No data found.  Updated Vital Signs BP (!) 159/77 (BP Location: Right Arm)   Pulse (!) 57   Temp 98.2 F (36.8 C) (Oral)   Resp 18   Ht _1  (1.575 m)   Wt 166 lb 0.1 oz (75.3 kg)   SpO2 98%   BMI 30.36 kg/m   Visual Acuity Right Eye Distance:   Left Eye Distance:   Bilateral Distance:    Right Eye Near:   Left Eye Near:    Bilateral Near:     Physical Exam  Constitutional: She is oriented to person, place, and time. She appears well-developed and well-nourished. No distress.  HENT:  Head: Normocephalic and atraumatic.  Right Ear: External ear normal.  Left Ear: External ear normal.  Nose: Nose normal.  Eyes: Conjunctivae are normal. Right eye exhibits no discharge. Left eye exhibits no discharge. No scleral icterus.   Neck: Neck supple. No thyromegaly present.  No carotid bruits bilaterally  Cardiovascular: Normal rate and regular rhythm.  No murmur heard. Pulmonary/Chest: Effort normal and breath sounds normal. No respiratory distress.  Musculoskeletal: Normal range of motion. She exhibits no edema.  Lymphadenopathy:    She has no cervical adenopathy.  Neurological: She is alert and oriented to person, place, and time.  Skin: Skin is warm and dry. Capillary refill takes less than 2 seconds. No rash noted. She is not diaphoretic.  Psychiatric: She has a normal mood and affect. Her behavior is normal. Judgment and thought content normal.  Nursing note reviewed.   UC Treatments / Results  Labs (all labs ordered are listed, but only abnormal results are displayed) Labs Reviewed - No data to display  EKG   Radiology No results found.  Procedures Procedures (including critical care time)  Medications Ordered in UC Medications - No data to display  Initial Impression / Assessment and Plan / UC Course  I have reviewed the triage vital signs and the nursing notes. HTN uncontrolled. I advised her to increase her Losartan to 100 mg qd til she sees her PCP's partner and see if this is the dose they want to keep her at. Encouraged to continue cutting down her carb's for better control of her glucose. See instructions.  Final Clinical Impressions(s) / UC Diagnoses   Final diagnoses:  Hypertension, unspecified type  Labyrinthitis of right ear  Hyperglycemia     Discharge Instructions     Take one more 50 mg Losartan today, then tomorrow am start taking 100 mg ( 2 of them a day) and keep the appointment with the doctor at your family physician place to see if they want to keep you at 100 mg dose from now on.  Keep working on your sugar diaries and your diet. Fasting glucose should be less than 100.    ED  Prescriptions    None     PDMP not reviewed this encounter.   Shelby Mattocks, [place] 06/05/20 1338

## 2020-06-06 ENCOUNTER — Ambulatory Visit (INDEPENDENT_AMBULATORY_CARE_PROVIDER_SITE_OTHER): Payer: Medicare Other | Admitting: Internal Medicine

## 2020-06-06 ENCOUNTER — Encounter: Payer: Self-pay | Admitting: Internal Medicine

## 2020-06-06 ENCOUNTER — Other Ambulatory Visit: Payer: Self-pay

## 2020-06-06 DIAGNOSIS — R42 Dizziness and giddiness: Secondary | ICD-10-CM

## 2020-06-06 DIAGNOSIS — I10 Essential (primary) hypertension: Secondary | ICD-10-CM

## 2020-06-06 NOTE — Progress Notes (Signed)
Subjective:    Patient ID: [name], female    DOB: 26-Feb-1934, 86 y.o.   MRN: [id number]  HPI  Here due to dizziness---follow up of 2 urgent care visits Husband here also This visit occurred during the SARS-CoV-2 public health emergency.  Safety protocols were in place, including screening questions prior to the visit, additional usage of staff PPE, and extensive cleaning of exam room while observing appropriate contact time as indicated for disinfecting solutions.   Started 3 days ago---very weak and trouble even walking Had spinning sensation ---off balance when walking (like going off to the side--either) Sent to urgent care BP was high then---got meclizine Went back yesterday for follow up  Still has to hold on when getting up from bed or chair Has been taking the meclizine tid---dizziness is better (but not gone)  Told to take extra losartan last night But went home and BP 621'H systolic Did take extra this morning  Current Outpatient Medications on File Prior to Visit  Medication Sig Dispense Refill  . ascorbic acid (VITAMIN C) 500 MG tablet Take 1 tablet (500 mg total) by mouth daily.    Marland Kitchen atenolol (TENORMIN) 50 MG tablet TAKE 1 TABLET BY MOUTH  DAILY 90 tablet 3  . Blood Glucose Monitoring Suppl (ONE TOUCH ULTRA SYSTEM KIT) W/DEVICE KIT 1 kit by Does not apply route once. 1 each 0  . Cholecalciferol (VITAMIN D3) 25 MCG (1000 UT) CAPS Take 1 capsule (1,000 Units total) by mouth daily. 30 capsule   . Coenzyme Q10 (COQ10) 100 MG CAPS Take 1 capsule by mouth daily.    . cyanocobalamin (V-R VITAMIN B-12) 500 MCG tablet Take 1 tablet (500 mcg total) by mouth daily.    Marland Kitchen erythromycin ophthalmic ointment SMARTSIG:Sparingly In Eye(s) Twice Daily    . glucose blood (ONETOUCH ULTRA) test strip Use to check blood sugar once daily. Dx Code E11.42 100 strip 3  . Lancet Device MISC One touch delica - Use as directed 1 each 1  . Lancets (ONETOUCH DELICA PLUS [id number]) MISC CHECK  BLOOD SUGAR 1-2 TIMES DAILY AS NEEDED 100 each 3  . Lancets Misc. (ACCU-CHEK SOFTCLIX LANCET DEV) KIT Use at home to test blood sugars daily. 1 kit 0  . levothyroxine (SYNTHROID) 112 MCG tablet TAKE 1 TABLET BY MOUTH  DAILY 90 tablet 0  . losartan (COZAAR) 50 MG tablet TAKE 1 TABLET BY MOUTH  DAILY 90 tablet 0  . meclizine (ANTIVERT) 12.5 MG tablet Take 1 tablet (12.5 mg total) by mouth 3 (three) times daily as needed for dizziness. 21 tablet 0  . montelukast (SINGULAIR) 10 MG tablet TAKE 1 TABLET BY MOUTH  DAILY 90 tablet 3  . omeprazole (PRILOSEC) 40 MG capsule Take 1 capsule (40 mg total) by mouth daily. 90 capsule 3  . [DISCONTINUED] atorvastatin (LIPITOR) 20 MG tablet Take 1 tablet (20 mg total) by mouth once a week. (Patient not taking: Reported on 03/31/2020) 12 tablet 3  . [DISCONTINUED] levocetirizine (XYZAL) 5 MG tablet Take 1 tablet (5 mg total) by mouth every evening. (Patient not taking: No sig reported) 30 tablet 5   No current facility-administered medications on file prior to visit.    Allergies  Allergen Reactions  . Align [Acidophilus]     Worsened abd bloating, pressure  . Augmentin [Amoxicillin-Pot Clavulanate]     Possible, had itchy rash  . Cefprozil Swelling  . Metformin And Related Other (See Comments)    Severe stomach pains, muscle aches  .  Timolol     Local reaction to eye drops    Past Medical History:  Diagnosis Date  . Allergy    hay fever  . Arthritis   . Cataract    resolved with surgery  . Chicken pox   . Colon polyp   . Diabetes mellitus 2008  . Generalized headaches    thinks caused by glaucoma  . Glaucoma   . Hypertension   . Right sided sciatica 07/03/2013  . Thyroid disease    hypothyroidism  . Urinary incontinence   . UTI (urinary tract infection)     Past Surgical History:  Procedure Laterality Date  . APPENDECTOMY  1998  . BUNIONECTOMY    . CHOLECYSTECTOMY    . COLONOSCOPY  2007   int hem, poor bowel prep, normal barium enema  in following ([facility])  . COLONOSCOPY WITH PROPOFOL N/A 07/22/2016   diverticulosis Allen Norris, Darren, MD)  . EYE SURGERY     R  eye-lid drop surgery  . TONSILLECTOMY      Family History  Problem Relation Age of Onset  . Heart disease Mother   . Asthma Mother   . Heart attack Mother 44       aneurysm ruptured by heart  . Heart disease Father   . Deep vein thrombosis Father   . Leukemia Brother   . Cancer Brother   . Diabetes Sister   . Crohn's disease Brother   . Diabetes Brother   . Arthritis Other   . Diabetes Other   . Cancer Maternal Aunt        ovarian?  . Cancer Maternal Grandfather        colon  . Breast cancer Neg Hx     Social History   Socioeconomic History  . Marital status: Married    Spouse name: Not on file  . Number of children: Not on file  . Years of education: Not on file  . Highest education level: Not on file  Occupational History  . Not on file  Tobacco Use  . Smoking status: Never Smoker  . Smokeless tobacco: Never Used  Vaping Use  . Vaping Use: Never used  Substance and Sexual Activity  . Alcohol use: Not Currently    Alcohol/week: 0.0 standard drinks    Comment: occasional  . Drug use: No  . Sexual activity: Yes  Other Topics Concern  . Not on file  Social History Narrative   Lives in [place], from [place]. Daughter lives here. 4 daughters.   From [place]   Work - retired Data processing manager   Diet - regular   Exercise - bike daily at home   Social Determinants of Radio broadcast assistant Strain: Not on [employer] Insecurity: Not on file  Transportation Needs: Not on file  Physical Activity: Not on file  Stress: Not on file  Social Connections: Not on file  Intimate Partner Violence: Not on file   Review of Systems No tinnitus Intermittent pain both ears Chronic hearing loss in left ear--nothing clearly new No headache    Objective:   Physical Exam Eyes:     Extraocular Movements: Extraocular movements intact.     Pupils: Pupils  are equal, round, and reactive to light.     Comments: No nystagmus  Cardiovascular:     Rate and Rhythm: Normal rate and regular rhythm.     Pulses: Normal pulses.     Heart sounds: No murmur heard. No gallop.   Pulmonary:  Effort: Pulmonary effort is normal.     Breath sounds: Normal breath sounds. No wheezing or rales.  Musculoskeletal:     Cervical back: Neck supple.  Lymphadenopathy:     Cervical: No cervical adenopathy.  Neurological:     Mental Status: She is alert and oriented to person, place, and time.     Cranial Nerves: Cranial nerves are intact.     Motor: No weakness, tremor or abnormal muscle tone.     Coordination: Romberg sign negative. Coordination normal. Finger-Nose-Finger Test normal.     Gait: Gait normal.     Comments: Romberg absent            Assessment & Plan:

## 2020-06-06 NOTE — Assessment & Plan Note (Signed)
Does seem vestibular Nothing to suggest stroke at this point Discussed continuing the meclizine three times a day for now--then weaning off when vertigo resolves Would set up with ENT if worsens (consider Epley)

## 2020-06-06 NOTE — Assessment & Plan Note (Signed)
BP Readings from Last 3 Encounters:  06/06/20 122/78  06/05/20 (!) 159/77  06/03/20 (S) (!) 178/88   I think the BP elevations were likely secondary to the vertigo Will have her resume her regular dose of the losartan and continue the atenolol

## 2020-06-09 DIAGNOSIS — H401133 Primary open-angle glaucoma, bilateral, severe stage: Secondary | ICD-10-CM | POA: Diagnosis not present

## 2020-06-16 ENCOUNTER — Other Ambulatory Visit: Payer: Self-pay | Admitting: Family Medicine

## 2020-06-17 ENCOUNTER — Other Ambulatory Visit: Payer: Self-pay

## 2020-06-17 ENCOUNTER — Other Ambulatory Visit: Payer: Self-pay | Admitting: Family Medicine

## 2020-06-17 ENCOUNTER — Encounter: Payer: Self-pay | Admitting: Podiatry

## 2020-06-17 ENCOUNTER — Ambulatory Visit: Payer: Medicare Other | Admitting: Podiatry

## 2020-06-17 DIAGNOSIS — M778 Other enthesopathies, not elsewhere classified: Secondary | ICD-10-CM

## 2020-06-17 DIAGNOSIS — Q828 Other specified congenital malformations of skin: Secondary | ICD-10-CM

## 2020-06-17 DIAGNOSIS — E1142 Type 2 diabetes mellitus with diabetic polyneuropathy: Secondary | ICD-10-CM

## 2020-06-18 ENCOUNTER — Encounter: Payer: Self-pay | Admitting: Podiatry

## 2020-06-18 NOTE — Progress Notes (Signed)
Subjective:  Patient ID: [name], female    DOB: [birth year],  MRN: [id number]  Chief Complaint  Patient presents with  . Nail Problem    Nail and callus trim     86 y.o. female presents with the above complaint.  Patient presents with follow-up of right third metatarsal benign skin lesion with associated capsulitis.  Patient states is still painful to touch and had gone away for a while but it came back again.  She is also interested in surgical discussion however she is not ready to get the surgery done yet.  She would like to do a steroid injection with debridement.  She denies any other acute complaints.   Review of Systems: Negative except as noted in the HPI. Denies N/V/F/Ch.  Past Medical History:  Diagnosis Date  . Allergy    hay fever  . Arthritis   . Cataract    resolved with surgery  . Chicken pox   . Colon polyp   . Diabetes mellitus 2008  . Generalized headaches    thinks caused by glaucoma  . Glaucoma   . Hypertension   . Right sided sciatica 07/03/2013  . Thyroid disease    hypothyroidism  . Urinary incontinence   . UTI (urinary tract infection)     Current Outpatient Medications:  .  ascorbic acid (VITAMIN C) 500 MG tablet, Take 1 tablet (500 mg total) by mouth daily., Disp:  , Rfl:  .  atenolol (TENORMIN) 50 MG tablet, TAKE 1 TABLET BY MOUTH  DAILY, Disp: 90 tablet, Rfl: 3 .  Blood Glucose Monitoring Suppl (ONE TOUCH ULTRA SYSTEM KIT) W/DEVICE KIT, 1 kit by Does not apply route once., Disp: 1 each, Rfl: 0 .  Cholecalciferol (VITAMIN D3) 25 MCG (1000 UT) CAPS, Take 1 capsule (1,000 Units total) by mouth daily., Disp: 30 capsule, Rfl:  .  Coenzyme Q10 (COQ10) 100 MG CAPS, Take 1 capsule by mouth daily., Disp: , Rfl:  .  cyanocobalamin (V-R VITAMIN B-12) 500 MCG tablet, Take 1 tablet (500 mcg total) by mouth daily., Disp: , Rfl:  .  erythromycin ophthalmic ointment, SMARTSIG:Sparingly In Eye(s) Twice Daily, Disp: , Rfl:  .  glucose blood (ONETOUCH  ULTRA) test strip, Use to check blood sugar once daily. Dx Code E11.42, Disp: 100 strip, Rfl: 3 .  Lancet Device MISC, One touch delica - Use as directed, Disp: 1 each, Rfl: 1 .  Lancets (ONETOUCH DELICA PLUS [id number]) MISC, CHECK BLOOD SUGAR 1-2 TIMES DAILY AS NEEDED, Disp: 100 each, Rfl: 3 .  Lancets Misc. (ACCU-CHEK SOFTCLIX LANCET DEV) KIT, Use at home to test blood sugars daily., Disp: 1 kit, Rfl: 0 .  levothyroxine (SYNTHROID) 112 MCG tablet, TAKE 1 TABLET BY MOUTH  DAILY, Disp: 90 tablet, Rfl: 0 .  losartan (COZAAR) 50 MG tablet, TAKE 1 TABLET BY MOUTH  DAILY, Disp: 90 tablet, Rfl: 1 .  meclizine (ANTIVERT) 12.5 MG tablet, Take 1 tablet (12.5 mg total) by mouth 3 (three) times daily as needed for dizziness., Disp: 21 tablet, Rfl: 0 .  montelukast (SINGULAIR) 10 MG tablet, TAKE 1 TABLET BY MOUTH  DAILY, Disp: 90 tablet, Rfl: 3 .  omeprazole (PRILOSEC) 40 MG capsule, Take 1 capsule (40 mg total) by mouth daily., Disp: 90 capsule, Rfl: 3  Social History   Tobacco Use  Smoking Status Never Smoker  Smokeless Tobacco Never Used    Allergies  Allergen Reactions  . Align [Acidophilus]     Worsened abd bloating, pressure  .  Augmentin [Amoxicillin-Pot Clavulanate]     Possible, had itchy rash  . Cefprozil Swelling  . Metformin And Related Other (See Comments)    Severe stomach pains, muscle aches  . Timolol     Local reaction to eye drops   Objective:  There were no vitals filed for this visit. There is no height or weight on file to calculate BMI. Constitutional Well developed. Well nourished.  Vascular Dorsalis pedis pulses palpable bilaterally. Posterior tibial pulses palpable bilaterally. Capillary refill normal to all digits.  No cyanosis or clubbing noted. Pedal hair growth normal.  Neurologic Normal speech. Oriented to person, place, and time. Epicritic sensation to light touch grossly present bilaterally.  Dermatologic Nails well groomed and normal in appearance. No  open wounds. No skin lesions.  Orthopedic:  Pain on palpation to the right submetatarsal 3 porokeratosis.  Pain on palpation.  Plantarflexed fourth metatarsal noted.  Hyperkeratotic lesion noted submetatarsal 3.  Central nucleated core noted.   Radiographs: None Assessment:   1. Type 2 diabetes, controlled, with peripheral neuropathy (HCC)   2. Capsulitis of right foot   3. Porokeratosis    Plan:  Patient was evaluated and treated and all questions answered.  Right submetatarsal 3 porokeratosis with underlying capsulitis -I explained to the patient the etiology of porokeratosis and various treatment options were extensively discussed.  I believe the porokeratosis that led to underlying capsulitis and patient will benefit from a steroid injection.  Patient agrees with the plan would like to proceed with a steroid injection.  I briefly discussed with the patient surgical intervention that involves dorsiflexor osteotomy of the metatarsal head to take the pressure away.  I also discussed with the patient orthotics management as well. -Offloading pad was dispensed if they are beneficial will consider orthotics management versus surgical management. -Using chisel blade and a handle, the hyperkeratotic lesion was aggressively debrided down to healthy striated tissue.  No complications noted.  No pinpoint bleeding noted.  The excision of the nucleated core was performed. -A steroid injection was performed of the right thyroid metatarsophalangeal joint using 1% plain Lidocaine and 10 mg of Kenalog. This was well tolerated.   No follow-ups on file.

## 2020-06-26 ENCOUNTER — Telehealth: Payer: Self-pay | Admitting: Family Medicine

## 2020-06-26 ENCOUNTER — Other Ambulatory Visit: Payer: Medicare Other

## 2020-06-26 NOTE — Progress Notes (Signed)
  Chronic Care Management   Outreach Note  06/26/2020 Name: [name] MRN: [id number] DOB: 06-Mar-1934  Referred by: Ria Bush, MD Reason for referral : No chief complaint on file.   A second unsuccessful telephone outreach was attempted today. The patient was referred to pharmacist for assistance with care management and care coordination.  Follow Up Plan:   Lauretta Grill Upstream Scheduler

## 2020-06-30 ENCOUNTER — Other Ambulatory Visit: Payer: Self-pay

## 2020-06-30 ENCOUNTER — Ambulatory Visit
Admission: RE | Admit: 2020-06-30 | Discharge: 2020-06-30 | Disposition: A | Discharge status: Home / Self Care | Payer: Medicare Other | Source: Ambulatory Visit | Attending: Family Medicine | Admitting: Family Medicine

## 2020-06-30 DIAGNOSIS — M85852 Other specified disorders of bone density and structure, left thigh: Secondary | ICD-10-CM | POA: Diagnosis not present

## 2020-06-30 DIAGNOSIS — M8588 Other specified disorders of bone density and structure, other site: Secondary | ICD-10-CM | POA: Diagnosis not present

## 2020-06-30 DIAGNOSIS — Z780 Asymptomatic menopausal state: Secondary | ICD-10-CM | POA: Diagnosis not present

## 2020-07-04 ENCOUNTER — Telehealth: Payer: Self-pay | Admitting: Family Medicine

## 2020-07-04 NOTE — Chronic Care Management (AMB) (Signed)
  Chronic Care Management   Note  07/04/2020 Name: ELDRED LIEVANOS MRN: [id number] DOB: 1934-03-02  [name] is a 86 y.o. year old female who is a primary care patient of Ria Bush, MD. I reached out to [name] by phone today in response to a referral sent by Ms. Zara Chess Rothermel's PCP, Ria Bush, MD.   Ms. Talcott was given information about Chronic Care Management services today including:  1. CCM service includes personalized support from designated clinical staff supervised by her physician, including individualized plan of care and coordination with other care providers 2. 24/7 contact phone numbers for assistance for urgent and routine care needs. 3. Service will only be billed when office clinical staff spend 20 minutes or more in a month to coordinate care. 4. Only one practitioner may furnish and bill the service in a calendar month. 5. The patient may stop CCM services at any time (effective at the end of the month) by phone call to the office staff.   Patient agreed to services and verbal consent obtained.   Follow up plan:   Tatjana Secretary/administrator

## 2020-08-05 ENCOUNTER — Ambulatory Visit: Payer: Medicare Other | Admitting: Podiatry

## 2020-08-05 ENCOUNTER — Other Ambulatory Visit: Payer: Self-pay

## 2020-08-05 ENCOUNTER — Ambulatory Visit (INDEPENDENT_AMBULATORY_CARE_PROVIDER_SITE_OTHER): Payer: Medicare Other

## 2020-08-05 DIAGNOSIS — M216X1 Other acquired deformities of right foot: Secondary | ICD-10-CM | POA: Diagnosis not present

## 2020-08-05 DIAGNOSIS — Q828 Other specified congenital malformations of skin: Secondary | ICD-10-CM | POA: Diagnosis not present

## 2020-08-05 DIAGNOSIS — Z01818 Encounter for other preprocedural examination: Secondary | ICD-10-CM | POA: Diagnosis not present

## 2020-08-05 DIAGNOSIS — M898X9 Other specified disorders of bone, unspecified site: Secondary | ICD-10-CM | POA: Diagnosis not present

## 2020-08-05 DIAGNOSIS — M778 Other enthesopathies, not elsewhere classified: Secondary | ICD-10-CM

## 2020-08-05 NOTE — Progress Notes (Signed)
G f

## 2020-08-06 ENCOUNTER — Encounter: Payer: Self-pay | Admitting: Podiatry

## 2020-08-06 NOTE — Progress Notes (Signed)
Subjective:  Patient ID: [name], female    DOB: 07-17-1934,  MRN: [id number]  Chief Complaint  Patient presents with   Callouses    Bilateral callouses     86 y.o. female presents with the above complaint.  Patient presents with follow-up of right third metatarsal benign skin lesion with underlying plantarflexed met and spurring of the first metatarsophalangeal joint secondary to arthritis.  She states she is doing well she states that nothing has really helped with terms of her pain.  The injection did not help to bring it down gave her temporary relief.  She would like to discuss surgical options at this time.  Review of Systems: Negative except as noted in the HPI. Denies N/V/F/Ch.  Past Medical History:  Diagnosis Date   Allergy    hay fever   Arthritis    Cataract    resolved with surgery   Chicken pox    Colon polyp    Diabetes mellitus 2008   Generalized headaches    thinks caused by glaucoma   Glaucoma    Hypertension    Right sided sciatica 07/03/2013   Thyroid disease    hypothyroidism   Urinary incontinence    UTI (urinary tract infection)     Current Outpatient Medications:    ascorbic acid (VITAMIN C) 500 MG tablet, Take 1 tablet (500 mg total) by mouth daily., Disp:  , Rfl:    atenolol (TENORMIN) 50 MG tablet, TAKE 1 TABLET BY MOUTH  DAILY, Disp: 90 tablet, Rfl: 3   Blood Glucose Monitoring Suppl (ONE TOUCH ULTRA SYSTEM KIT) W/DEVICE KIT, 1 kit by Does not apply route once., Disp: 1 each, Rfl: 0   Cholecalciferol (VITAMIN D3) 25 MCG (1000 UT) CAPS, Take 1 capsule (1,000 Units total) by mouth daily., Disp: 30 capsule, Rfl:    Coenzyme Q10 (COQ10) 100 MG CAPS, Take 1 capsule by mouth daily., Disp: , Rfl:    cyanocobalamin (V-R VITAMIN B-12) 500 MCG tablet, Take 1 tablet (500 mcg total) by mouth daily., Disp: , Rfl:    erythromycin ophthalmic ointment, SMARTSIG:Sparingly In Eye(s) Twice Daily, Disp: , Rfl:    glucose blood (ONETOUCH ULTRA) test strip, Use  to check blood sugar once daily. Dx Code E11.42, Disp: 100 strip, Rfl: 3   Lancet Device MISC, One touch delica - Use as directed, Disp: 1 each, Rfl: 1   Lancets (ONETOUCH DELICA PLUS [id number]) MISC, CHECK BLOOD SUGAR 1-2 TIMES DAILY AS NEEDED, Disp: 100 each, Rfl: 3   Lancets Misc. (ACCU-CHEK SOFTCLIX LANCET DEV) KIT, Use at home to test blood sugars daily., Disp: 1 kit, Rfl: 0   levothyroxine (SYNTHROID) 112 MCG tablet, TAKE 1 TABLET BY MOUTH  DAILY, Disp: 90 tablet, Rfl: 2   losartan (COZAAR) 50 MG tablet, TAKE 1 TABLET BY MOUTH  DAILY, Disp: 90 tablet, Rfl: 1   meclizine (ANTIVERT) 12.5 MG tablet, Take 1 tablet (12.5 mg total) by mouth 3 (three) times daily as needed for dizziness., Disp: 21 tablet, Rfl: 0   montelukast (SINGULAIR) 10 MG tablet, TAKE 1 TABLET BY MOUTH  DAILY, Disp: 90 tablet, Rfl: 3   omeprazole (PRILOSEC) 40 MG capsule, Take 1 capsule (40 mg total) by mouth daily., Disp: 90 capsule, Rfl: 3  Social History   Tobacco Use  Smoking Status Never  Smokeless Tobacco Never    Allergies  Allergen Reactions   Align [Acidophilus]     Worsened abd bloating, pressure   Augmentin [Amoxicillin-Pot Clavulanate]  Possible, had itchy rash   Cefprozil Swelling   Metformin And Related Other (See Comments)    Severe stomach pains, muscle aches   Timolol     Local reaction to eye drops   Objective:  There were no vitals filed for this visit. There is no height or weight on file to calculate BMI. Constitutional Well developed. Well nourished.  Vascular Dorsalis pedis pulses palpable bilaterally. Posterior tibial pulses palpable bilaterally. Capillary refill normal to all digits.  No cyanosis or clubbing noted. Pedal hair growth normal.  Neurologic Normal speech. Oriented to person, place, and time. Epicritic sensation to light touch grossly present bilaterally.  Dermatologic Nails well groomed and normal in appearance. No open wounds. No skin lesions.  Orthopedic:   Pain on palpation to the right submetatarsal 3 porokeratosis.  Pain on palpation.  Plantarflexed third metatarsal noted.  Hyperkeratotic lesion noted submetatarsal 3.  Central nucleated core noted.  Pain on palpation to the dorsal spur of the right first metatarsophalangeal joint.  Pain on palpation to the spurring.  No pain with range of motion of the first MPJ joint.  No intra-articular pain noted at the first MPJ joint.   Radiographs: 3 views of skeletally mature the right foot: Dorsal fleck sign noted to the first metatarsal.  Some beginning signs of arthritic changes noted to the first MPJ joint.  No previous hardware noted.  Sesamoid position does appear reduced.  No IM angle noted.  Plantarflexed metatarsal of the third noted.  Metatarsal parabola is intact. Assessment:   1. 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 Bony exostosis   3. Preoperative examination    Plan:  Patient was evaluated and treated and all questions answered.  Right foot dorsal exostosis of the first metatarsal secondary to underlying arthritic changes with a history of previous bunion correction - To the patient etiology of dorsal fleck sign with exostosis and various treatment options were discussed.  I believe she will benefit from a cheilectomy to help reduce the girth and the size of the hypertrophic bone.  I discussed with her my surgical plan which includes going through the previous incision to remove the spurring.  I discussed this in extensive detail she states understanding and would like to proceed with exostectomy of the dorsal first met.  I discussed my preoperative S postsurgical plan in extensive detail. -She will be weightbearing as tolerated in a surgical shoe for both procedures -Informed surgical risk consent was reviewed and read aloud to the patient.  I reviewed the films.  I have discussed my findings with the patient in great detail.  I have discussed all risks including but not limited  to infection, stiffness, scarring, limp, disability, deformity, damage to blood vessels and nerves, numbness, poor healing, need for braces, arthritis, chronic pain, amputation, death.  All benefits and realistic expectations discussed in great detail.  I have made no promises as to the outcome.  I have provided realistic expectations.  I have offered the patient a 2nd opinion, which they have declined and assured me they preferred to proceed despite the risks -I also discussed with her that eventually she may need first metatarsophalangeal joint fusion versus arthroplasty.  She states understanding -I also discussed that given that she is a diabetic she is at risk of wound complication.  She states understanding and would like to proceed given her diabetes.  Her last A1c was 6.8   Right submetatarsal 3 porokeratosis with underlying plantarflexed metatarsal -I clinically  the steroid injection and offloading did not help or give her any benefit.  At this time patient would like to discuss surgical options.  I discussed with her that given the amount of pain that the third metatarsal is causing to the skin lesion underneath it I believe she will benefit from a floating osteotomy of right third metatarsal.  This will be without fixation I discussed my preoperative postoperative plan in extensive detail.  She states understanding like to proceed with that. -Informed surgical risk consent was reviewed and read aloud to the patient.  I reviewed the films.  I have discussed my findings with the patient in great detail.  I have discussed all risks including but not limited to infection, stiffness, scarring, limp, disability, deformity, damage to blood vessels and nerves, numbness, poor healing, need for braces, arthritis, chronic pain, amputation, death.  All benefits and realistic expectations discussed in great detail.  I have made no promises as to the outcome.  I have provided realistic expectations.  I have  offered the patient a 2nd opinion, which they have declined and assured me they preferred to proceed despite the risks   No follow-ups on file.

## 2020-08-11 ENCOUNTER — Other Ambulatory Visit: Payer: Self-pay | Admitting: Family Medicine

## 2020-08-11 DIAGNOSIS — Z1231 Encounter for screening mammogram for malignant neoplasm of breast: Secondary | ICD-10-CM

## 2020-08-15 ENCOUNTER — Telehealth: Payer: Medicare Other

## 2020-08-21 ENCOUNTER — Telehealth: Payer: Self-pay

## 2020-08-21 NOTE — Chronic Care Management (AMB) (Addendum)
Chronic Care Management Pharmacy Assistant   Name: [name]  MRN: [id number] DOB: 04-May-1934  [name] is an 86 y.o. year old female who presents for his initial CCM visit with the clinical pharmacist.  Reason for Encounter: Initial Questions   Conditions to be addressed/monitored: HTN and DMII   Recent office visits:  06/06/20 - Dr.Letvak - Discussed weaning off meclizine when vertigo resolves 03/31/20 - Dr.Gutierrez PCP [facility] - no medication changes follow up 6 months   Recent consult visits:  08/05/20 - Podiatry no medication changes 06/17/20 - Podiatry no medication changes 06/09/20 - Ophthalmology  no medication changes 03/26/20 - Ophthalmology no medication changes  Hospital visits:  06/05/20 - [facility] - Hypertension episode advised her to increase her Losartan to 100 mg qd til she sees her PCP. 06/03/20 - [facility] - Dizziness, may just be her elevated glucose. Will do glucose diary and f/u with PCP. I educated her how to cut out carbs.  Medications: Outpatient Encounter Medications as of 08/21/2020  Medication Sig   ascorbic acid (VITAMIN C) 500 MG tablet Take 1 tablet (500 mg total) by mouth daily.   atenolol (TENORMIN) 50 MG tablet TAKE 1 TABLET BY MOUTH  DAILY   Blood Glucose Monitoring Suppl (ONE TOUCH ULTRA SYSTEM KIT) W/DEVICE KIT 1 kit by Does not apply route once.   Cholecalciferol (VITAMIN D3) 25 MCG (1000 UT) CAPS Take 1 capsule (1,000 Units total) by mouth daily.   Coenzyme Q10 (COQ10) 100 MG CAPS Take 1 capsule by mouth daily.   cyanocobalamin (V-R VITAMIN B-12) 500 MCG tablet Take 1 tablet (500 mcg total) by mouth daily.   erythromycin ophthalmic ointment SMARTSIG:Sparingly In Eye(s) Twice Daily   glucose blood (ONETOUCH ULTRA) test strip Use to check blood sugar once daily. Dx Code E11.42   Lancet Device MISC One touch delica - Use as directed   Lancets (ONETOUCH DELICA PLUS [id number]) MISC CHECK BLOOD SUGAR 1-2 TIMES  DAILY AS NEEDED   Lancets Misc. (ACCU-CHEK SOFTCLIX LANCET DEV) KIT Use at home to test blood sugars daily.   levothyroxine (SYNTHROID) 112 MCG tablet TAKE 1 TABLET BY MOUTH  DAILY   losartan (COZAAR) 50 MG tablet TAKE 1 TABLET BY MOUTH  DAILY   meclizine (ANTIVERT) 12.5 MG tablet Take 1 tablet (12.5 mg total) by mouth 3 (three) times daily as needed for dizziness.   montelukast (SINGULAIR) 10 MG tablet TAKE 1 TABLET BY MOUTH  DAILY   omeprazole (PRILOSEC) 40 MG capsule Take 1 capsule (40 mg total) by mouth daily.   [DISCONTINUED] atorvastatin (LIPITOR) 20 MG tablet Take 1 tablet (20 mg total) by mouth once a week. (Patient not taking: Reported on 03/31/2020)   [DISCONTINUED] levocetirizine (XYZAL) 5 MG tablet Take 1 tablet (5 mg total) by mouth every evening. (Patient not taking: No sig reported)   No facility-administered encounter medications on file as of 08/21/2020.     Lab Results  Component Value Date/Time   HGBA1C 6.8 (H) 03/28/2020 09:05 AM   HGBA1C 6.7 (A) 12/19/2019 10:36 AM   HGBA1C 6.6 (A) 09/18/2019 08:56 AM   HGBA1C 6.6 (H) 06/15/2019 08:46 AM   MICROALBUR 2.2 (H) 08/13/2015 09:23 AM   MICROALBUR <0.7 04/17/2015 09:56 AM     BP Readings from Last 3 Encounters:  06/06/20 122/78  06/05/20 (!) 159/77  06/03/20 (S) (!) 178/88    Have you seen any other providers since your last visit with PCP? Yes  08/05/20 - Podiatry 06/17/20 -Podiatry 06/09/20 -Ophthalmology  Any changes in your medications or health? No  Any side effects from any medications? No  Do you have an symptoms or problems not managed by your medications? No  Any concerns about your health right now? No  Has your provider asked that you check blood pressure, blood sugar, or follow special diet at home? Yes The patient reports she has both a BG monitor and BP monitor and she will take readings intermittently and her BG's fasting are 98,105 .She diets with not eating potatoes and eats salads   Do you get any  type of exercise on a regular basis? No  Can you think of a goal you would like to reach for your health? No  Do you have any problems getting your medications? No  Is there anything that you would like to discuss during the appointment? No  [name] was reminded to have all medications, supplements and any blood glucose and blood pressure readings available for review with Debbora Dus, Pharm. D, at her telephone visit on 08/27/2020 at 11:00 AM.    Star Rating Drugs:  Medication:  Last Fill: Day Supply Losartan 4m 07/16/20  90   Follow-Up:  Pharmacist Review  MDebbora Dus CPP notified  VMillburyAssistant [phone] I have reviewed the care management and care coordination activities outlined in this encounter and I am certifying that I agree with the content of this note. No further action required.  MDebbora Dus PharmD Clinical Pharmacist [phone]

## 2020-08-27 ENCOUNTER — Telehealth: Payer: Self-pay

## 2020-08-27 ENCOUNTER — Telehealth: Payer: Medicare Other

## 2020-08-27 NOTE — Progress Notes (Deleted)
Chronic Care Management Pharmacy Note  08/27/2020 Name:  [name] MRN:  [id number] DOB:  08-25-1934  Summary: ***  Recommendations/Changes made from today's visit: ***  Plan: ***   Subjective: [name] is an 86 y.o. year old female who is a primary patient of Ria Bush, MD.  The CCM team was consulted for assistance with disease management and care coordination needs.    Engaged with patient by telephone for initial visit in response to provider referral for pharmacy case management and/or care coordination services.   Consent to Services:  The patient was given the following information about Chronic Care Management services today, agreed to services, and gave verbal consent: 1. CCM service includes personalized support from designated clinical staff supervised by the primary care provider, including individualized plan of care and coordination with other care providers 2. 24/7 contact phone numbers for assistance for urgent and routine care needs. 3. Service will only be billed when office clinical staff spend 20 minutes or more in a month to coordinate care. 4. Only one practitioner may furnish and bill the service in a calendar month. 5.The patient may stop CCM services at any time (effective at the end of the month) by phone call to the office staff. 6. The patient will be responsible for cost sharing (co-pay) of up to 20% of the service fee (after annual deductible is met). Patient agreed to services and consent obtained.  Patient Care Team: Ria Bush, MD as PCP - General (Family Medicine) Karren Burly, Deirdre Peer, MD as Referring Physician (Ophthalmology) Imagene Riches, CNM as Midwife (Obstetrics) Debbora Dus, [facility] as Pharmacist (Pharmacist)  Recent office visits:  06/06/20 [facility] discussed weaning off meclizine when vertigo resolves 03/31/20 - Dr.Gutierrez PCP [facility]  no medication changes follow up 6 months   Recent consult visits:   08/05/20 - Podiatry no medication changes 06/17/20 - Podiatry no medication changes 06/09/20 - Ophthalmology  no medication changes 03/26/20 - Ophthalmology no medication changes   Hospital visits:  06/05/20 - [facility] Hypertension episode advised her to increase her Losartan to 100 mg qd til she sees her PCP. 06/03/20 - [facility]  Dizziness  may just be her elevated glucose, so will do diaries and Fu with PCP I educated her how to cut out carbs which she did not know to avoid them being a diabetic and will do glucose diaries.   Objective:  Lab Results  Component Value Date   CREATININE 0.91 03/28/2020   BUN 21 03/28/2020   GFR 58.48 (L) 03/28/2020   GFRNONAA >60 01/27/2018   GFRAA >60 01/27/2018   NA 141 03/28/2020   K 4.5 03/28/2020   CALCIUM 9.4 03/28/2020   CO2 31 03/28/2020   GLUCOSE 124 (H) 03/28/2020    Lab Results  Component Value Date/Time   HGBA1C 6.8 (H) 03/28/2020 09:05 AM   HGBA1C 6.7 (A) 12/19/2019 10:36 AM   HGBA1C 6.6 (A) 09/18/2019 08:56 AM   HGBA1C 6.6 (H) 06/15/2019 08:46 AM   GFR 58.48 (L) 03/28/2020 09:05 AM   GFR 63.93 09/18/2019 09:35 AM   MICROALBUR 2.2 (H) 08/13/2015 09:23 AM   MICROALBUR <0.7 04/17/2015 09:56 AM    Last diabetic Eye exam:  Lab Results  Component Value Date/Time   HMDIABEYEEXA No Retinopathy 01/15/2017 12:00 AM    Last diabetic Foot exam:  Lab Results  Component Value Date/Time   HMDIABFOOTEX Normal 08/13/2015 12:00 AM     Lab Results  Component  Value Date   CHOL 127 03/28/2020   HDL 50.10 03/28/2020   LDLCALC 63 03/28/2020   TRIG 70.0 03/28/2020   CHOLHDL 3 03/28/2020    Hepatic Function Latest Ref Rng & Units 03/28/2020 06/15/2019 02/02/2019  Total Protein 6.0 - 8.3 g/dL 6.9 6.4 6.9  Albumin 3.5 - 5.2 g/dL 4.2 4.2 4.3  AST 0 - 37 U/L _0 ALT 0 - 35 U/L _1 Alk Phosphatase 39 - 117 U/L 63 66 72  Total Bilirubin 0.2 - 1.2 mg/dL 0.7 0.7 0.6  Bilirubin, Direct 0.0 - 0.3 mg/dL - - -     Lab Results  Component Value Date/Time   TSH 0.88 03/28/2020 09:05 AM   TSH 1.40 06/15/2019 08:46 AM   FREET4 0.81 01/31/2018 08:34 AM   FREET4 1.02 01/26/2017 10:14 AM    CBC Latest Ref Rng & Units 03/28/2020 01/27/2018 11/26/2016  WBC 4.0 - 10.5 K/uL 5.3 7.7 5.0  Hemoglobin 12.0 - 15.0 g/dL 14.1 14.1 14.1  Hematocrit 36.0 - 46.0 % 42.9 42.9 42.8  Platelets 150.0 - 400.0 K/uL 205.0 220 208.0    Lab Results  Component Value Date/Time   VD25OH 32.43 09/25/2013 09:48 AM    Clinical ASCVD: No  The ASCVD Risk score (Wilson., et al., 2013) failed to calculate for the following reasons:   The 2013 ASCVD risk score is only valid for ages 77 to 70    Depression screen PHQ 2/9 03/31/2020 02/02/2019 01/31/2018  Decreased Interest 0 0 0  Down, Depressed, Hopeless 0 0 0  PHQ - 2 Score 0 0 0  Altered sleeping - 0 0  Tired, decreased energy - 0 0  Change in appetite - 0 0  Feeling bad or failure about yourself  - 0 0  Trouble concentrating - 0 0  Moving slowly or fidgety/restless - 0 0  Suicidal thoughts - 0 0  PHQ-9 Score - 0 0  Difficult doing work/chores - Not difficult at all Not difficult at all    Social History   Tobacco Use  Smoking Status Never  Smokeless Tobacco Never   BP Readings from Last 3 Encounters:  06/06/20 122/78  06/05/20 (!) 159/77  06/03/20 (S) (!) 178/88   Pulse Readings from Last 3 Encounters:  06/06/20 (!) 53  06/05/20 (!) 57  06/03/20 68   Wt Readings from Last 3 Encounters:  06/06/20 170 lb (77.1 kg)  06/05/20 166 lb 0.1 oz (75.3 kg)  06/03/20 166 lb (75.3 kg)   BMI Readings from Last 3 Encounters:  06/06/20 31.09 kg/m  06/05/20 30.36 kg/m  06/03/20 30.36 kg/m    Assessment/Interventions: Review of patient past medical history, allergies, medications, health status, including review of consultants reports, laboratory and other test data, was performed as part of comprehensive evaluation and provision of chronic care management  services.   SDOH:  (Social Determinants of Health) assessments and interventions performed: Yes  SDOH Screenings   Alcohol Screen: Not on file  Depression (PHQ2-9): Low Risk    PHQ-2 Score: 0  Financial Resource Strain: Not on file  Food Insecurity: Not on file  Housing: Not on file  Physical Activity: Not on file  Social Connections: Not on file  Stress: Not on file  Tobacco Use: Low Risk    Smoking Tobacco Use: Never   Smokeless Tobacco Use: Never  Transportation Needs: Not on file    [place]  Allergies  Allergen Reactions   Align [Acidophilus]  Worsened abd bloating, pressure   Augmentin [Amoxicillin-Pot Clavulanate]     Possible, had itchy rash   Cefprozil Swelling   Metformin And Related Other (See Comments)    Severe stomach pains, muscle aches   Timolol     Local reaction to eye drops    Medications Reviewed Today     Reviewed by Felipa Furnace, DPM (Physician) on 08/06/20 at [phone]  Med List Status: <None>   Medication Order Taking? Sig Documenting Provider Last Dose Status Informant  ascorbic acid (VITAMIN C) 500 MG tablet [id number] No Take 1 tablet (500 mg total) by mouth daily. Ria Bush, MD Taking Active   atenolol (TENORMIN) 50 MG tablet [id number] No TAKE 1 TABLET BY MOUTH  DAILY Ria Bush, MD Taking Active   Patient not taking:  Discontinued 06/03/20 1104   Blood Glucose Monitoring Suppl (ONE TOUCH ULTRA SYSTEM KIT) W/DEVICE KIT [id number] No 1 kit by Does not apply route once. Jackolyn Confer, MD Taking Active   Cholecalciferol (VITAMIN D3) 25 MCG (1000 UT) CAPS [id number] No Take 1 capsule (1,000 Units total) by mouth daily. Ria Bush, MD Taking Active   Coenzyme Q10 (COQ10) 100 MG CAPS [id number] No Take 1 capsule by mouth daily. [provider] Taking Active Self  cyanocobalamin (V-R VITAMIN B-12) 500 MCG tablet [id number] No Take 1 tablet (500 mcg total) by mouth daily. Ria Bush, MD Taking Active    erythromycin ophthalmic ointment [id number] No SMARTSIG:Sparingly In Eye(s) Twice Daily [provider] Taking Active   glucose blood (ONETOUCH ULTRA) test strip [id number] No Use to check blood sugar once daily. Dx Code X45.85 Ria Bush, MD Taking Active   Lancet Device MISC [id number] No One touch delica - Use as directed Ria Bush, MD Taking Active   Lancets (ONETOUCH DELICA PLUS MNOTRR11A) [place] [id number] No CHECK BLOOD SUGAR 1-2 TIMES DAILY AS NEEDED Ria Bush, MD Taking Active   Lancets Misc. (ACCU-CHEK SOFTCLIX LANCET DEV) KIT [id number] No Use at home to test blood sugars daily. Ria Bush, MD Taking Active   Patient not taking:  Discontinued 06/03/20 1105   levothyroxine (SYNTHROID) 112 MCG tablet [id number]  TAKE 1 TABLET BY MOUTH  DAILY Ria Bush, MD  Active   losartan (COZAAR) 50 MG tablet [id number]  TAKE 1 TABLET BY MOUTH  DAILY Ria Bush, MD  Active   meclizine (ANTIVERT) 12.5 MG tablet [id number] No Take 1 tablet (12.5 mg total) by mouth 3 (three) times daily as needed for dizziness. Rodriguez-Southworth, [name], PA-C Taking Active   montelukast (SINGULAIR) 10 MG tablet [id number] No TAKE 1 TABLET BY MOUTH  DAILY Ria Bush, MD Taking Active   omeprazole (PRILOSEC) 40 MG capsule [id number] No Take 1 capsule (40 mg total) by mouth daily. Ria Bush, MD Taking Active             Patient Active Problem List   Diagnosis Date Noted   Vertigo 06/06/2020   Bilateral hearing loss 04/03/2020   Nasal sinus congestion 11/14/2019   Syncope and collapse 11/14/2019   Non-restorative sleep 11/14/2019   Rosacea 11/14/2019   Shoulder strain, right, initial encounter 08/17/2019   Varicose veins of right lower extremity with inflammation 02/15/2019   Lumbar back pain with radiculopathy affecting right lower extremity 02/02/2018   Chronic cough 09/20/2017   Lesion of lip 05/19/2017   Right lower quadrant abdominal pain  04/21/2017   Lactose intolerance 04/21/2017   Fatty liver disease, nonalcoholic 80/22/3361   Abdominal discomfort, epigastric 11/26/2016   Low  vitamin B12 level 06/24/2016   Thoracic radiculitis 06/04/2016   Advanced care planning/counseling discussion 12/25/2015   Arthralgia 04/17/2015   Abnormal CXR 01/17/2015   Health maintenance examination 01/02/2015   Snoring 01/02/2015   Leg cramps 12/27/2013   Skin growth 09/25/2013   Allergic rhinitis 74/14/2395   History of Helicobacter pylori infection 01/15/2013   Medicare annual wellness visit, subsequent 09/14/2012   Osteopenia 06/01/2012   Hypertension 11/29/2011   Glaucoma 11/29/2011   Type 2 diabetes, controlled, with peripheral neuropathy ([place]) 07/22/2011   Hypothyroidism 07/22/2011    Immunization History  Administered Date(s) Administered   Fluad Quad(high Dose 65+) 10/19/2018, 10/16/2019, 11/29/2019   Influenza Split 11/29/2011   Influenza, High Dose Seasonal PF 12/13/2014   Influenza,inj,Quad PF,6+ Mos 12/15/2012, 11/21/2013, 11/13/2015, 10/26/2016, 11/24/2017   PFIZER(Purple Top)SARS-COV-2 Vaccination 02/22/2019, 03/15/2019, 11/19/2019   Pneumococcal Conjugate-13 03/23/2013   Pneumococcal Polysaccharide-23 07/22/1998, 12/31/2011    Conditions to be addressed/monitored:  Hypertension, Hyperlipidemia, Diabetes, Hypothyroidism, and Osteopenia  There are no care plans that you recently modified to display for this patient.   Current Barriers:  {pharmacybarriers:24917}  Pharmacist Clinical Goal(s):  Patient will {PHARMACYGOALCHOICES:24921} through collaboration with PharmD and provider.   Interventions: 1:1 collaboration with Ria Bush, MD regarding development and update of comprehensive plan of care as evidenced by provider attestation and co-signature Inter-disciplinary care team collaboration (see longitudinal plan of care) Comprehensive medication review performed; medication list updated in electronic  medical record  Hypertension (BP goal <140/90) -{US controlled/uncontrolled:25276} -Current treatment: Atenolol 50 mg - 1 tablet daily Losartan 50 mg - 1 tablet daily -Medications previously tried: ***  -Current home readings: *** -Current dietary habits: *** -Current exercise habits: *** -{ACTIONS;DENIES/REPORTS:21021675} hypotensive/hypertensive symptoms -Educated on {CCM BP Counseling:25124} -Counseled to monitor BP at home ***, document, and provide log at future appointments -Recommended to continue current medication  Hyperlipidemia: (LDL goal < 100) -{US controlled/uncontrolled:25276} -Current treatment: None -Medications previously tried: Atorvastatin   -Current dietary patterns: *** -Current exercise habits: *** -Educated on {CCM HLD Counseling:25126} -{CCMPHARMDINTERVENTION:25122}  Diabetes (A1c goal <7%) -{US controlled/uncontrolled:25276} -Current medications: None -Medications previously tried: ***  -Current home glucose readings fasting glucose: *** post prandial glucose: *** -{ACTIONS;DENIES/REPORTS:21021675} hypoglycemic/hyperglycemic symptoms -Current meal patterns:  breakfast: ***  lunch: ***  dinner: *** snacks: *** drinks: *** -Current exercise: *** -Educated on {CCM DM COUNSELING:25123} -Counseled to check feet daily and get yearly eye exams -{CCMPHARMDINTERVENTION:25122}  *** (Goal: ***) -{US controlled/uncontrolled:25276} -Current treatment  *** -Medications previously tried: ***  -{CCMPHARMDINTERVENTION:25122}  Patient Goals/Self-Care Activities Patient will:  - {pharmacypatientgoals:24919}  Follow Up Plan: {CM FOLLOW UP VUYE:33435}   Medication Assistance: {MEDASSISTANCEINFO:25044}  Compliance/Adherence/Medication fill history: Care Gaps: ***  Star-Rating Drugs: ***  Patient's preferred pharmacy is:  [facility] - [place], [place] - [address] [place]  [place] [address] Phone: [phone] Fax: [id number] [place], [place] - [address] [address] Phone: [phone] Fax: [phone]  Uses pill box? {Yes or If no, why not?:20788} Pt endorses ***% compliance  We discussed: {Pharmacy options:24294} Patient decided to: {US Pharmacy DCVU:13143}  Care Plan and Follow Up Patient Decision:  {FOLLOWUP:24991}  Plan: {CM FOLLOW UP OOIL:57972}  Debbora Dus, PharmD Clinical Pharmacist [facility] [phone]

## 2020-09-12 ENCOUNTER — Telehealth: Payer: Self-pay | Admitting: Family Medicine

## 2020-09-12 NOTE — Progress Notes (Signed)
  Chronic Care Management   Outreach Note  09/12/2020 Name: [name] MRN: [id number] DOB: 07/05/1934  Referred by: Ria Bush, MD Reason for referral : No chief complaint on file.   An unsuccessful telephone outreach was attempted today. The patient was referred to the pharmacist for assistance with care management and care coordination.   Follow Up Plan:   Left vm to r/s missed apt  [place]

## 2020-09-12 NOTE — Telephone Encounter (Signed)
  Chronic Care Management   Outreach Note  08/27/20 Name: [name] MRN: [id number] DOB: March 16, 1934  Referred by: Ria Bush, MD Missed CCM appointment   Attempted to reach patient by telephone for chronic care management visit on 08/27/20. 20 minutes spent in chart review and preparation for this visit. Unable to reach patient despite multiple attempts. Will have schedule team contact for rescheduling.  Debbora Dus, PharmD Clinical Pharmacist [facility] [facility] [phone]

## 2020-09-19 ENCOUNTER — Other Ambulatory Visit: Payer: Self-pay | Admitting: Family Medicine

## 2020-09-19 ENCOUNTER — Other Ambulatory Visit: Payer: Self-pay

## 2020-09-19 ENCOUNTER — Ambulatory Visit
Admission: RE | Admit: 2020-09-19 | Discharge: 2020-09-19 | Disposition: A | Discharge status: Home / Self Care | Payer: Medicare Other | Source: Ambulatory Visit | Attending: Family Medicine | Admitting: Family Medicine

## 2020-09-19 DIAGNOSIS — Z1231 Encounter for screening mammogram for malignant neoplasm of breast: Secondary | ICD-10-CM | POA: Diagnosis not present

## 2020-09-22 ENCOUNTER — Other Ambulatory Visit: Payer: Self-pay | Admitting: Pulmonary Disease

## 2020-09-29 ENCOUNTER — Encounter: Payer: Self-pay | Admitting: Family Medicine

## 2020-09-29 ENCOUNTER — Ambulatory Visit (INDEPENDENT_AMBULATORY_CARE_PROVIDER_SITE_OTHER): Payer: Medicare Other | Admitting: Family Medicine

## 2020-09-29 ENCOUNTER — Other Ambulatory Visit: Payer: Self-pay

## 2020-09-29 VITALS — BP 150/80 | HR 70 | Temp 97.6°F | Ht 62.0 in | Wt 161.6 lb

## 2020-09-29 DIAGNOSIS — J309 Allergic rhinitis, unspecified: Secondary | ICD-10-CM | POA: Diagnosis not present

## 2020-09-29 DIAGNOSIS — I10 Essential (primary) hypertension: Secondary | ICD-10-CM

## 2020-09-29 DIAGNOSIS — E1142 Type 2 diabetes mellitus with diabetic polyneuropathy: Secondary | ICD-10-CM | POA: Diagnosis not present

## 2020-09-29 LAB — POCT GLYCOSYLATED HEMOGLOBIN (HGB A1C): Hemoglobin A1C: 6.3 % — AB (ref 4.0–5.6)

## 2020-09-29 MED ORDER — LOSARTAN POTASSIUM 100 MG PO TABS: 100.0000 mg | ORAL_TABLET | Freq: Every day | ORAL | 3 refills | Status: DC

## 2020-09-29 NOTE — Assessment & Plan Note (Signed)
She feels allergies largely controlled only on montelukast.  Notes drainage sensation to posterior of throat. Decided to stop xyzal. Suggested restart this.

## 2020-09-29 NOTE — Assessment & Plan Note (Signed)
Chronic, deteriorated.  Will increase losartan to '100mg'$  daily.  Recheck at CPE.

## 2020-09-29 NOTE — Progress Notes (Signed)
Patient ID: [name], female    DOB: Apr 20, 1934, 86 y.o.   MRN: [id number]  This visit was conducted in person.  BP (!) 150/80   Pulse 70   Temp 97.6 F (36.4 C) (Temporal)   Ht _0  (1.575 m)   Wt 161 lb 9 oz (73.3 kg)   SpO2 94%   BMI 29.55 kg/m   168/84 on repeat  CC: DM 41mof/u visit  Subjective:   HPI: [name] a 86y.o. female presenting on 09/29/2020 for Diabetes (Here for 6 mo f/u.  Pt accompanied by husband, [name]- temp 97.6.)   Never tried weekly atorvastatin.  Upcoming bunion surgery (Posey Pronto.   DM - does regularly check sugars fasting <120. Compliant with antihyperglycemic regimen which includes: diet control. 10 lb weight loss since last visit. Likes drinking water with lemon. Denies low sugars. Occasional dizziness, nausea. Denies paresthesias, blurry vision. Last diabetic eye exam DUE. Glucometer brand: onetouch/accuchek. Last foot exam: 12/2019, regularly sees podiatry. DSME: has not completed - declines for now. Lab Results  Component Value Date   HGBA1C 6.3 (A) 09/29/2020   Diabetic Foot Exam - Simple   Simple Foot Form Diabetic Foot exam was performed with the following findings: Yes 09/29/2020 12:06 PM  Visual Inspection No deformities, no ulcerations, no other skin breakdown bilaterally: Yes Sensation Testing Intact to touch and monofilament testing bilaterally: Yes Pulse Check Posterior Tibialis and Dorsalis pulse intact bilaterally: Yes Comments    Lab Results  Component Value Date   MICROALBUR 2.2 (H) 08/13/2015        Relevant past medical, surgical, family and social history reviewed and updated as indicated. Interim medical history since our last visit reviewed. Allergies and medications reviewed and updated. Outpatient Medications Prior to Visit  Medication Sig Dispense Refill   ascorbic acid (VITAMIN C) 500 MG tablet Take 1 tablet (500 mg total) by mouth daily.     atenolol (TENORMIN) 50 MG tablet TAKE 1 TABLET BY  MOUTH  DAILY 90 tablet 3   Blood Glucose Monitoring Suppl (ONE TOUCH ULTRA SYSTEM KIT) W/DEVICE KIT 1 kit by Does not apply route once. 1 each 0   Cholecalciferol (VITAMIN D3) 25 MCG (1000 UT) CAPS Take 1 capsule (1,000 Units total) by mouth daily. 30 capsule    Coenzyme Q10 (COQ10) 100 MG CAPS Take 1 capsule by mouth daily.     cyanocobalamin (V-R VITAMIN B-12) 500 MCG tablet Take 1 tablet (500 mcg total) by mouth daily.     glucose blood (ONETOUCH ULTRA) test strip USE TO CHECK BLOOD SUGAR ONCE DAILY 100 strip 3   Lancet Device MISC One touch delica - Use as directed 1 each 1   Lancets (ONETOUCH DELICA PLUS [id number] MISC CHECK BLOOD SUGAR 1-2 TIMES DAILY AS NEEDED 100 each 3   Lancets Misc. (ACCU-CHEK SOFTCLIX LANCET DEV) KIT Use at home to test blood sugars daily. 1 kit 0   levothyroxine (SYNTHROID) 112 MCG tablet TAKE 1 TABLET BY MOUTH  DAILY 90 tablet 2   montelukast (SINGULAIR) 10 MG tablet TAKE 1 TABLET BY MOUTH  DAILY 90 tablet 3   omeprazole (PRILOSEC) 40 MG capsule Take 1 capsule (40 mg total) by mouth daily. 90 capsule 3   losartan (COZAAR) 50 MG tablet TAKE 1 TABLET BY MOUTH  DAILY 90 tablet 1   levocetirizine (XYZAL) 5 MG tablet TAKE 1 TABLET(5 MG) BY MOUTH EVERY EVENING 30 tablet 5   erythromycin ophthalmic ointment SMARTSIG:Sparingly In  Eye(s) Twice Daily     levocetirizine (XYZAL) 5 MG tablet TAKE 1 TABLET(5 MG) BY MOUTH EVERY EVENING (Patient not taking: Reported on 09/29/2020) 30 tablet 5   meclizine (ANTIVERT) 12.5 MG tablet Take 1 tablet (12.5 mg total) by mouth 3 (three) times daily as needed for dizziness. 21 tablet 0   No facility-administered medications prior to visit.     Per HPI unless specifically indicated in ROS section below Review of Systems  Objective:  BP (!) 150/80   Pulse 70   Temp 97.6 F (36.4 C) (Temporal)   Ht _0  (1.575 m)   Wt 161 lb 9 oz (73.3 kg)   SpO2 94%   BMI 29.55 kg/m   Wt Readings from Last 3 Encounters:  09/29/20 161 lb 9 oz  (73.3 kg)  06/06/20 170 lb (77.1 kg)  06/05/20 166 lb 0.1 oz (75.3 kg)      Physical Exam Vitals and nursing note reviewed.  Constitutional:      Appearance: Normal appearance. She is not ill-appearing.  Eyes:     Extraocular Movements: Extraocular movements intact.     Conjunctiva/sclera: Conjunctivae normal.     Pupils: Pupils are equal, round, and reactive to light.  Cardiovascular:     Rate and Rhythm: Normal rate and regular rhythm.     Pulses: Normal pulses.     Heart sounds: Normal heart sounds. No murmur heard. Pulmonary:     Effort: Pulmonary effort is normal. No respiratory distress.     Breath sounds: Normal breath sounds. No wheezing, rhonchi or rales.  Musculoskeletal:     Right lower leg: No edema.     Left lower leg: No edema.     Comments: See HPI for foot exam if done  Skin:    General: Skin is warm and dry.     Findings: No rash.  Neurological:     Mental Status: She is alert.  Psychiatric:        Mood and Affect: Mood normal.        Behavior: Behavior normal.      Results for orders placed or performed in visit on 09/29/20  POCT glycosylated hemoglobin (Hb A1C)  Result Value Ref Range   Hemoglobin A1C 6.3 (A) 4.0 - 5.6 %   HbA1c POC (<> result, manual entry)     HbA1c, POC (prediabetic range)     HbA1c, POC (controlled diabetic range)      Assessment & Plan:  This visit occurred during the SARS-CoV-2 public health emergency.  Safety protocols were in place, including screening questions prior to the visit, additional usage of staff PPE, and extensive cleaning of exam room while observing appropriate contact time as indicated for disinfecting solutions.   Problem List Items Addressed This Visit     Type 2 diabetes, controlled, with peripheral neuropathy ([name]) - Primary    Improvement noted with healthy diet changes. Continue efforts towards diet control.  Motivated to continue these change.       Relevant Medications   losartan (COZAAR) 100 MG  tablet   Other Relevant Orders   POCT glycosylated hemoglobin (Hb A1C) (Completed)   Hypertension    Chronic, deteriorated.  Will increase losartan to 136m daily.  Recheck at CPE.       Relevant Medications   losartan (COZAAR) 100 MG tablet   Allergic rhinitis    She feels allergies largely controlled only on montelukast.  Notes drainage sensation to posterior of throat. Decided to stop xyzal.  Suggested restart this.         Meds ordered this encounter  Medications   losartan (COZAAR) 100 MG tablet    Sig: Take 1 tablet (100 mg total) by mouth daily.    Dispense:  90 tablet    Refill:  3    Orders Placed This Encounter  Procedures   POCT glycosylated hemoglobin (Hb A1C)     Patient Instructions  Haga cita para revision de ojos /vision Puede tratar Laqueta Linden xyzal para sintomas de garganta Azucar esta mucho mejor - ahora esta en nivel de prediabetes.  Su presion esta demasiado alta - suba dosis de losartan a 161m diarios Regresar en 6 meses para fisico.   Follow up plan: Return in about 6 months (around 04/01/2021) for annual exam, prior fasting for blood work, medicare wellness visit.  JRia Bush MD

## 2020-09-29 NOTE — Patient Instructions (Addendum)
Haga cita para revision de ojos /vision Puede tratar Laqueta Linden xyzal para sintomas de garganta Azucar esta mucho mejor - ahora esta en nivel de prediabetes.  Su presion esta demasiado alta - suba dosis de losartan a '100mg'$  diarios Regresar en 6 meses para fisico.

## 2020-09-29 NOTE — Assessment & Plan Note (Addendum)
Improvement noted with healthy diet changes. Continue efforts towards diet control.  Motivated to continue these change.

## 2020-10-01 ENCOUNTER — Telehealth: Payer: Self-pay | Admitting: Urology

## 2020-10-01 NOTE — Telephone Encounter (Signed)
DOS - 10/27/20  CHEILECTOMY RIGHT --- [id number] METATARSAL OSTEOTOMY 3RD RIGHT --- [id number]   [facility] EFFECTIVE DATE - 02/16/20   PLAN DEDUCTIBLE - $0.00 OUT OF POCKET - $4,500.00 W/ $4,297.40 REMAINING COINSURANCE - 0% COPAY - $325.00   PER [employer] WEB SITE FOR CPT CODE 29562 AND 13086  Notification or Prior Authorization is not required for the requested services  Decision ID [id number]

## 2020-10-27 ENCOUNTER — Other Ambulatory Visit: Payer: Self-pay | Admitting: Podiatry

## 2020-10-27 ENCOUNTER — Encounter: Payer: Self-pay | Admitting: Podiatry

## 2020-10-27 DIAGNOSIS — M25774 Osteophyte, right foot: Secondary | ICD-10-CM | POA: Diagnosis not present

## 2020-10-27 DIAGNOSIS — M205X1 Other deformities of toe(s) (acquired), right foot: Secondary | ICD-10-CM | POA: Diagnosis not present

## 2020-10-27 DIAGNOSIS — M2021 Hallux rigidus, right foot: Secondary | ICD-10-CM | POA: Diagnosis not present

## 2020-10-27 DIAGNOSIS — M21541 Acquired clubfoot, right foot: Secondary | ICD-10-CM | POA: Diagnosis not present

## 2020-10-27 MED ORDER — OXYCODONE-ACETAMINOPHEN 5-325 MG PO TABS: 1.0000 | ORAL_TABLET | ORAL | 0 refills | Status: AC | PRN

## 2020-10-27 MED ORDER — IBUPROFEN 800 MG PO TABS: 800.0000 mg | ORAL_TABLET | Freq: Four times a day (QID) | ORAL | 1 refills | Status: DC | PRN

## 2020-10-28 ENCOUNTER — Telehealth: Payer: Self-pay

## 2020-10-28 NOTE — Progress Notes (Addendum)
Chronic Care Management Pharmacy Assistant   Name: [name]  MRN: [id number] DOB: 28-Sep-1934  [name] is an 86 y.o. year old female who presents for his initial CCM visit with the clinical pharmacist.  Reason for Encounter: Initial Questions   Conditions to be addressed/monitored: HTN and DMII   Recent office visits:  09/29/2020 - Ria Bush, MD - Patient presented for DM visit. Changed: Increase Losartan to 141m daily. Stop: Montelukast per patient decision. Follow up in 6 months.  06/06/2020 - JRia Bush MD - Patient present for vertigo. Discussed weaning off meclizine when vertigo resolves. 03/31/2020 - JRia Bush MD - [facility] changes follow up 6 months  Recent consult visits:  10/27/2020 - Podiatry - Orders only listed in chart. Medication ordered: Ibuprofen (ADVIL) 800 MG tablet and OxyCODONE-acetaminophen(PERCOCET/ROXICET) 5-325 MG tablet. No other information was noted in the chart. Patient states she is on bed rest for three weeks due to surgery on her foot yesterday (10/27/2020). Patient had corn and bunion surgery. She can not walk on her foot. Started: Ibuprofen (ADVIL) 800 MG tablet and OxyCODONE-acetaminophen. Patient stated she has only had two Oxycodone and she is not going to take any more.  08/05/2020 - Podiatry no medication changes 06/17/2020 - Podiatry no medication changes 06/09/2020 - Ophthalmology  no medication changes 03/26/2020 - Ophthalmology no medication changes  Hospital visits:  None in previous 6 months  Medications: Outpatient Encounter Medications as of 10/28/2020  Medication Sig   ascorbic acid (VITAMIN C) 500 MG tablet Take 1 tablet (500 mg total) by mouth daily.   atenolol (TENORMIN) 50 MG tablet TAKE 1 TABLET BY MOUTH  DAILY   Blood Glucose Monitoring Suppl (ONE TOUCH ULTRA SYSTEM KIT) W/DEVICE KIT 1 kit by Does not apply route once.   Cholecalciferol (VITAMIN D3) 25 MCG  (1000 UT) CAPS Take 1 capsule (1,000 Units total) by mouth daily.   Coenzyme Q10 (COQ10) 100 MG CAPS Take 1 capsule by mouth daily.   cyanocobalamin (V-R VITAMIN B-12) 500 MCG tablet Take 1 tablet (500 mcg total) by mouth daily.   glucose blood (ONETOUCH ULTRA) test strip USE TO CHECK BLOOD SUGAR ONCE DAILY   ibuprofen (ADVIL) 800 MG tablet Take 1 tablet (800 mg total) by mouth every 6 (six) hours as needed.   Lancet Device MISC One touch delica - Use as directed   Lancets (ONETOUCH DELICA PLUS [id number] MISC CHECK BLOOD SUGAR 1-2 TIMES DAILY AS NEEDED   Lancets Misc. (ACCU-CHEK SOFTCLIX LANCET DEV) KIT Use at home to test blood sugars daily.   levocetirizine (XYZAL) 5 MG tablet TAKE 1 TABLET(5 MG) BY MOUTH EVERY EVENING   levothyroxine (SYNTHROID) 112 MCG tablet TAKE 1 TABLET BY MOUTH  DAILY   losartan (COZAAR) 100 MG tablet Take 1 tablet (100 mg total) by mouth daily.   montelukast (SINGULAIR) 10 MG tablet TAKE 1 TABLET BY MOUTH  DAILY   omeprazole (PRILOSEC) 40 MG capsule Take 1 capsule (40 mg total) by mouth daily.   oxyCODONE-acetaminophen (PERCOCET/ROXICET) 5-325 MG tablet Take 1 tablet by mouth every 4 (four) hours as needed for up to 5 days for severe pain.   [DISCONTINUED] atorvastatin (LIPITOR) 20 MG tablet Take 1 tablet (20 mg total) by mouth once a week. (Patient not taking: Reported on 03/31/2020)   No facility-administered encounter medications on file as of 10/28/2020.    Lab Results  Component Value Date/Time   HGBA1C 6.3 (A) 09/29/2020 11:34 AM  HGBA1C 6.8 (H) 03/28/2020 09:05 AM   HGBA1C 6.7 (A) 12/19/2019 10:36 AM   HGBA1C 6.6 (H) 06/15/2019 08:46 AM   MICROALBUR 2.2 (H) 08/13/2015 09:23 AM   MICROALBUR <0.7 04/17/2015 09:56 AM     BP Readings from Last 3 Encounters:  09/29/20 (!) 150/80  06/06/20 122/78  06/05/20 (!) 159/77    Patient contacted to review initial questions prior to visit with Debbora Dus.  Have you seen any other providers since your last  visit with PCP? Yes  Any changes in your medications or health? Yes  Any side effects from any medications? No  Do you have an symptoms or problems not managed by your medications? No  Any concerns about your health right now? No  Has your provider asked that you check blood pressure, blood sugar, or follow special diet at home? Yes Blood Pressure - 132/69 on 10/28/2020     130/76 on 10/27/2020 Blood Sugar - 127 on 10/28/2020 (patient had cake the night before) 103 on 10/27/2020  Do you get any type of exercise on a regular basis? Yes - Patient states she does "bend downs". Patient states she is on bed rest for three weeks due to surgery on her foot yesterday (10/27/2020). Patient had corn and bunion surgery. She can not walk on her foot.   Can you think of a goal you would like to reach for your health? No  Do you have any problems getting your medications? No  Is there anything that you would like to discuss during the appointment? No  Patient states she is on bed rest for three weeks due to surgery on her foot yesterday (10/27/2020). Patient had corn and bunion surgery. She can not walk on her foot. Started: Ibuprofen (ADVIL) 800 MG tablet and OxyCODONE-acetaminophen. Patient stated she has only had two Oxycodone and she is not going to take any more.   [name] was reminded to have all medications, supplements and any blood glucose and blood pressure readings available for review with Debbora Dus, Pharm. D, at her telephone visit on 11/04/2020 at 11:00 am.  Star Rating Drugs:  Medication:  Last Fill: Day Supply Losartan 59m            09/29/2020      90   Care Gaps: Annual wellness visit in last year? Yes 03/31/2020 Most Recent BP reading: 150/80 on 09/29/2020  If Diabetic: Most recent A1C reading: 6.3 on 09/29/2020 Last eye exam / retinopathy screening: 01/2020 Last diabetic foot exam: 12/2019  MDebbora Dus CPP notified  AMarijean Niemann [phone]  Time Spent: 40 minutes I have reviewed the care management and care coordination activities outlined in this encounter and I am certifying that I agree with the content of this note. No further action required.  MDebbora Dus PharmD Clinical Pharmacist [phone]

## 2020-10-30 ENCOUNTER — Other Ambulatory Visit: Payer: Self-pay | Admitting: Family Medicine

## 2020-10-30 NOTE — Telephone Encounter (Signed)
Atorvastatin discontinued on 06/03/20 by Shelby Mattocks, PA.  Last filled: 09/04/20 Last OV:  09/29/20, 6 mo f/u Next OV:  04/07/21, AWV prt 2

## 2020-11-04 ENCOUNTER — Ambulatory Visit (INDEPENDENT_AMBULATORY_CARE_PROVIDER_SITE_OTHER): Payer: Medicare Other | Admitting: Podiatry

## 2020-11-04 ENCOUNTER — Other Ambulatory Visit: Payer: Self-pay

## 2020-11-04 ENCOUNTER — Ambulatory Visit (INDEPENDENT_AMBULATORY_CARE_PROVIDER_SITE_OTHER): Payer: Medicare Other

## 2020-11-04 VITALS — BP 132/69

## 2020-11-04 DIAGNOSIS — E1142 Type 2 diabetes mellitus with diabetic polyneuropathy: Secondary | ICD-10-CM

## 2020-11-04 DIAGNOSIS — M216X1 Other acquired deformities of right foot: Secondary | ICD-10-CM

## 2020-11-04 DIAGNOSIS — Z9889 Other specified postprocedural states: Secondary | ICD-10-CM

## 2020-11-04 DIAGNOSIS — M898X9 Other specified disorders of bone, unspecified site: Secondary | ICD-10-CM

## 2020-11-04 DIAGNOSIS — I10 Essential (primary) hypertension: Secondary | ICD-10-CM

## 2020-11-04 MED ORDER — DOXYCYCLINE HYCLATE 100 MG PO TABS: 100.0000 mg | ORAL_TABLET | Freq: Two times a day (BID) | ORAL | 0 refills | Status: AC

## 2020-11-04 NOTE — Progress Notes (Signed)
Subjective:  Patient ID: [name], female    DOB: 1934/05/03,  MRN: [id number]  Chief Complaint  Patient presents with   Routine Post Op    DOS 9.12.22    DOS: 10/27/2020 Procedure: Right third metatarsal floating osteotomy with right foot cheilectomy of the first MPJ  86 y.o. female returns for post-op check.  Patient states she is doing well.  Pain is well controlled with pain medication.  She has been weightbearing as tolerated cam boot to the partial heel.  She denies any nausea fever chills vomiting.  Bandages clean dry and intact  Review of Systems: Negative except as noted in the HPI. Denies N/V/F/Ch.  Past Medical History:  Diagnosis Date   Allergy    hay fever   Arthritis    Cataract    resolved with surgery   Chicken pox    Colon polyp    Diabetes mellitus 2008   Generalized headaches    thinks caused by glaucoma   Glaucoma    Hypertension    Right sided sciatica 07/03/2013   Thyroid disease    hypothyroidism   Urinary incontinence    UTI (urinary tract infection)     Current Outpatient Medications:    ascorbic acid (VITAMIN C) 500 MG tablet, Take 1 tablet (500 mg total) by mouth daily., Disp:  , Rfl:    atenolol (TENORMIN) 50 MG tablet, TAKE 1 TABLET BY MOUTH  DAILY, Disp: 90 tablet, Rfl: 3   Blood Glucose Monitoring Suppl (ONE TOUCH ULTRA SYSTEM KIT) W/DEVICE KIT, 1 kit by Does not apply route once., Disp: 1 each, Rfl: 0   Cholecalciferol (VITAMIN D3) 25 MCG (1000 UT) CAPS, Take 1 capsule (1,000 Units total) by mouth daily., Disp: 30 capsule, Rfl:    Coenzyme Q10 (COQ10) 100 MG CAPS, Take 1 capsule by mouth daily., Disp: , Rfl:    cyanocobalamin (V-R VITAMIN B-12) 500 MCG tablet, Take 1 tablet (500 mcg total) by mouth daily., Disp: , Rfl:    glucose blood (ONETOUCH ULTRA) test strip, USE TO CHECK BLOOD SUGAR ONCE DAILY, Disp: 100 strip, Rfl: 3   ibuprofen (ADVIL) 800 MG tablet, Take 1 tablet (800 mg total) by mouth every 6 (six) hours as needed. (Patient  not taking: Reported on 11/04/2020), Disp: 60 tablet, Rfl: 1   Lancet Device MISC, One touch delica - Use as directed, Disp: 1 each, Rfl: 1   Lancets (ONETOUCH DELICA PLUS [id number]) MISC, CHECK BLOOD SUGAR 1-2 TIMES DAILY AS NEEDED, Disp: 100 each, Rfl: 3   Lancets Misc. (ACCU-CHEK SOFTCLIX LANCET DEV) KIT, Use at home to test blood sugars daily., Disp: 1 kit, Rfl: 0   levocetirizine (XYZAL) 5 MG tablet, TAKE 1 TABLET(5 MG) BY MOUTH EVERY EVENING (Patient not taking: Reported on 11/04/2020), Disp: 30 tablet, Rfl: 5   levothyroxine (SYNTHROID) 112 MCG tablet, TAKE 1 TABLET BY MOUTH  DAILY, Disp: 90 tablet, Rfl: 2   losartan (COZAAR) 100 MG tablet, Take 1 tablet (100 mg total) by mouth daily., Disp: 90 tablet, Rfl: 3   montelukast (SINGULAIR) 10 MG tablet, TAKE 1 TABLET BY MOUTH  DAILY, Disp: 90 tablet, Rfl: 3   omeprazole (PRILOSEC) 40 MG capsule, Take 1 capsule (40 mg total) by mouth daily. (Patient taking differently: Take 40 mg by mouth daily as needed.), Disp: 90 capsule, Rfl: 3  Social History   Tobacco Use  Smoking Status Never  Smokeless Tobacco Never    Allergies  Allergen Reactions   Align [Acidophilus]  Worsened abd bloating, pressure   Augmentin [Amoxicillin-Pot Clavulanate]     Possible, had itchy rash   Cefprozil Swelling   Metformin And Related Other (See Comments)    Severe stomach pains, muscle aches   Timolol     Local reaction to eye drops   Objective:  There were no vitals filed for this visit. There is no height or weight on file to calculate BMI. Constitutional Well developed. Well nourished.  Vascular Foot warm and well perfused. Capillary refill normal to all digits.   Neurologic Normal speech. Oriented to person, place, and time. Epicritic sensation to light touch grossly present bilaterally.  Dermatologic Skin healing well without signs of infection. Skin edges well coapted without signs of infection.  Orthopedic: Tenderness to palpation noted about  the surgical site.   Radiographs: 3 views of skeletally mature the right foot: Good correction alignment noted.  Reduction of the dorsal fleck sign noted at the first MPJ. Assessment:   1. Bony exostosis   2. Plantar flexed metatarsal bone of right foot   3. Status post foot surgery    Plan:  Patient was evaluated and treated and all questions answered.  S/p foot surgery right -Progressing as expected post-operatively. -XR: See above -WB Status: Weightbearing as tolerated in surgical shoe -Sutures: Intact.  No clinical signs of dehiscence noted.  No complication noted. -Medications: None status post foot surgery -Foot redressed.  No follow-ups on file.

## 2020-11-04 NOTE — Progress Notes (Signed)
Chronic Care Management Pharmacy Note  11/20/2020 Name:  [name] MRN:  [id number] DOB:  Mar 05, 1934  Summary: Initial CCM visit. Discussed recent med changes (losartan increased to 100 mg 09/2020). Patient home BP is now 130/70s with dose increase, doing well. DM, stable controlled on no medication. Eye exam due per chart/care gaps, pt reports up to date on exams. HLD, she reports she is taking the weekly dose atorvastatin now - added back to med list. Reviewed all medications, no concerns identified. No cost concerns.  Recommendations/Changes made from today's visit: Bring eye exam records to next visit   Plan: CMA follow up for atorvastatin adherence in 1 month (want to ensure she does not refill since order was prev discontinued), remind about diabetic eye exam records CCM visit 12 months    Subjective: [name] is an 86 y.o. year old female who is a primary patient of Ria Bush, MD.  The CCM team was consulted for assistance with disease management and care coordination needs.    Engaged with patient by telephone for initial visit in response to provider referral for pharmacy case management and/or care coordination services.   Consent to Services:  The patient was given the following information about Chronic Care Management services today, agreed to services, and gave verbal consent: 1. CCM service includes personalized support from designated clinical staff supervised by the primary care provider, including individualized plan of care and coordination with other care providers 2. 24/7 contact phone numbers for assistance for urgent and routine care needs. 3. Service will only be billed when office clinical staff spend 20 minutes or more in a month to coordinate care. 4. Only one practitioner may furnish and bill the service in a calendar month. 5.The patient may stop CCM services at any time (effective at the end of the month) by phone call to the office staff. 6.  The patient will be responsible for cost sharing (co-pay) of up to 20% of the service fee (after annual deductible is met). Patient agreed to services and consent obtained.  Patient Care Team: Ria Bush, MD as PCP - General (Family Medicine) Karren Burly Deirdre Peer, MD as Referring Physician (Ophthalmology) Imagene Riches, CNM as Midwife (Obstetrics) Debbora Dus, [facility] as Pharmacist (Pharmacist)  Recent office visits:  09/29/2020 - Ria Bush, MD - Patient presented for DM visit. Changed: Increase Losartan to 141m daily. Stop: Montelukast per patient decision. Follow up in 6 months.  06/06/2020 - JRia Bush MD - Patient present for vertigo. Discussed weaning off meclizine when vertigo resolves. 03/31/2020 - JRia Bush MD - [facility] changes follow up 6 [facility]   Recent consult visits:  10/27/2020 - Podiatry - Orders only listed in chart. Medication ordered: Ibuprofen (ADVIL) 800 MG tablet and OxyCODONE-acetaminophen(PERCOCET/ROXICET) 5-325 MG tablet. No other information was noted in the chart. Patient states she is on bed rest for three weeks due to surgery on her foot yesterday (10/27/2020). Patient had corn and bunion surgery. She can not walk on her foot. Started: Ibuprofen (ADVIL) 800 MG tablet and OxyCODONE-acetaminophen. Patient stated she has only had two Oxycodone and she is not going to take any more.  08/05/2020 - Podiatry no medication changes 06/17/2020 - Podiatry no medication changes 06/09/2020 - Ophthalmology  no medication changes 03/26/2020 - Ophthalmology no medication changes   Hospital visits:  None in previous 6 months   Objective:  Lab Results  Component Value Date   CREATININE 0.91 03/28/2020  BUN 21 03/28/2020   GFR 58.48 (L) 03/28/2020   GFRNONAA >60 01/27/2018   GFRAA >60 01/27/2018   NA 141 03/28/2020   K 4.5 03/28/2020   CALCIUM 9.4 03/28/2020   CO2 31 03/28/2020   GLUCOSE 124 (H)  03/28/2020    Lab Results  Component Value Date/Time   HGBA1C 6.3 (A) 09/29/2020 11:34 AM   HGBA1C 6.8 (H) 03/28/2020 09:05 AM   HGBA1C 6.7 (A) 12/19/2019 10:36 AM   HGBA1C 6.6 (H) 06/15/2019 08:46 AM   GFR 58.48 (L) 03/28/2020 09:05 AM   GFR 63.93 09/18/2019 09:35 AM   MICROALBUR 2.2 (H) 08/13/2015 09:23 AM   MICROALBUR <0.7 04/17/2015 09:56 AM    Last diabetic Eye exam:  Lab Results  Component Value Date/Time   HMDIABEYEEXA No Retinopathy 01/15/2017 12:00 AM    Last diabetic Foot exam: 11/04/20 podiatry visit    Lab Results  Component Value Date   CHOL 127 03/28/2020   HDL 50.10 03/28/2020   LDLCALC 63 03/28/2020   TRIG 70.0 03/28/2020   CHOLHDL 3 03/28/2020    Hepatic Function Latest Ref Rng & Units 03/28/2020 06/15/2019 02/02/2019  Total Protein 6.0 - 8.3 g/dL 6.9 6.4 6.9  Albumin 3.5 - 5.2 g/dL 4.2 4.2 4.3  AST 0 - 37 U/L 19 19 18   ALT 0 - 35 U/L 30 30 29   Alk Phosphatase 39 - 117 U/L 63 66 72  Total Bilirubin 0.2 - 1.2 mg/dL 0.7 0.7 0.6  Bilirubin, Direct 0.0 - 0.3 mg/dL - - -    Lab Results  Component Value Date/Time   TSH 0.88 03/28/2020 09:05 AM   TSH 1.40 06/15/2019 08:46 AM   FREET4 0.81 01/31/2018 08:34 AM   FREET4 1.02 01/26/2017 10:14 AM    CBC Latest Ref Rng & Units 03/28/2020 01/27/2018 11/26/2016  WBC 4.0 - 10.5 K/uL 5.3 7.7 5.0  Hemoglobin 12.0 - 15.0 g/dL 14.1 14.1 14.1  Hematocrit 36.0 - 46.0 % 42.9 42.9 42.8  Platelets 150.0 - 400.0 K/uL 205.0 220 208.0    Lab Results  Component Value Date/Time   VD25OH 32.43 09/25/2013 09:48 AM    Clinical ASCVD: No  The ASCVD Risk score (Arnett DK, et al., 2019) failed to calculate for the following reasons:   The 2019 ASCVD risk score is only valid for ages 84 to 84    Depression screen PHQ 2/9 03/31/2020 02/02/2019 01/31/2018  Decreased Interest 0 0 0  Down, Depressed, Hopeless 0 0 0  PHQ - 2 Score 0 0 0  Altered sleeping - 0 0  Tired, decreased energy - 0 0  Change in appetite - 0 0  Feeling  bad or failure about yourself  - 0 0  Trouble concentrating - 0 0  Moving slowly or fidgety/restless - 0 0  Suicidal thoughts - 0 0  PHQ-9 Score - 0 0  Difficult doing work/chores - Not difficult at all Not difficult at all    Social History   Tobacco Use  Smoking Status Never  Smokeless Tobacco Never   BP Readings from Last 3 Encounters:  11/04/20 132/69  09/29/20 (!) 150/80  06/06/20 122/78   Pulse Readings from Last 3 Encounters:  09/29/20 70  06/06/20 (!) 53  06/05/20 (!) 57   Wt Readings from Last 3 Encounters:  09/29/20 161 lb 9 oz (73.3 kg)  06/06/20 170 lb (77.1 kg)  06/05/20 166 lb 0.1 oz (75.3 kg)   BMI Readings from Last 3 Encounters:  09/29/20 29.55 kg/m  06/06/20 31.09 kg/m  06/05/20 30.36 kg/m    Assessment/Interventions: Review of patient past medical history, allergies, medications, health status, including review of consultants reports, laboratory and other test data, was performed as part of comprehensive evaluation and provision of chronic care management services.   SDOH:  (Social Determinants of Health) assessments and interventions performed: Yes  SDOH Screenings   Alcohol Screen: Not on file  Depression (PHQ2-9): Low Risk    PHQ-2 Score: 0  Financial Resource Strain: Not on file  Food Insecurity: Not on file  Housing: Not on file  Physical Activity: Not on file  Social Connections: Not on file  Stress: Not on file  Tobacco Use: Low Risk    Smoking Tobacco Use: Never   Smokeless Tobacco Use: Never  Transportation Needs: Not on file    [place]  Allergies  Allergen Reactions   Align [Acidophilus]     Worsened abd bloating, pressure   Augmentin [Amoxicillin-Pot Clavulanate]     Possible, had itchy rash   Cefprozil Swelling   Metformin And Related Other (See Comments)    Severe stomach pains, muscle aches   Timolol     Local reaction to eye drops    Medications Reviewed Today     Reviewed by Debbora Dus, [facility]  (Pharmacist) on 11/20/20 at 1054  Med List Status: <None>   Medication Order Taking? Sig Documenting Provider Last Dose Status Informant  ascorbic acid (VITAMIN C) 500 MG tablet [id number] Yes Take 1 tablet (500 mg total) by mouth daily. Ria Bush, MD Taking Active   atenolol (TENORMIN) 50 MG tablet [id number] Yes TAKE 1 TABLET BY MOUTH  DAILY Ria Bush, MD Taking Active   atorvastatin (LIPITOR) 20 MG tablet [id number] Yes Take 20 mg by mouth once a week. Pt reports she is taking weekly 11/04/20 [provider] Taking Active Self  Blood Glucose Monitoring Suppl (ONE TOUCH ULTRA SYSTEM KIT) W/DEVICE KIT [id number] Yes 1 kit by Does not apply route once. Jackolyn Confer, MD Taking Active   Cholecalciferol (VITAMIN D3) 25 MCG (1000 UT) CAPS [id number] Yes Take 1 capsule (1,000 Units total) by mouth daily. Ria Bush, MD Taking Active   Coenzyme Q10 (COQ10) 100 MG CAPS [id number] Yes Take 1 capsule by mouth daily. [provider] Taking Active Self  cyanocobalamin (V-R VITAMIN B-12) 500 MCG tablet [id number] Yes Take 1 tablet (500 mcg total) by mouth daily. Ria Bush, MD Taking Active   glucose blood [facility] ULTRA) test strip [id number] Yes USE TO CHECK BLOOD SUGAR ONCE DAILY Ria Bush, MD Taking Active   ibuprofen (ADVIL) 800 MG tablet [id number] No Take 1 tablet (800 mg total) by mouth every 6 (six) hours as needed.  Patient not taking: Reported on 11/04/2020   Felipa Furnace, DPM Not Taking Active   Lancet Device MISC [id number] Yes One touch delica - Use as directed Ria Bush, MD Taking Active   Lancets (ONETOUCH DELICA PLUS [id number]) [place] [id number] Yes CHECK BLOOD SUGAR 1-2 TIMES DAILY AS NEEDED Ria Bush, MD Taking Active   Lancets Misc. ([place]) KIT [id number] Yes Use at home to test blood sugars daily. Ria Bush, MD Taking Active   levocetirizine (XYZAL) 5 MG tablet [id number] No TAKE 1 TABLET(5 MG)  BY MOUTH EVERY EVENING  Patient not taking: Reported on 11/04/2020   Chesley Mires, MD Not Taking Active   levothyroxine (SYNTHROID) 112 MCG tablet [id number] Yes TAKE 1 TABLET BY MOUTH  DAILY Ria Bush, MD Taking Active  losartan (COZAAR) 100 MG tablet [id number] Yes Take 1 tablet (100 mg total) by mouth daily. Ria Bush, MD Taking Active   montelukast (SINGULAIR) 10 MG tablet [id number] Yes TAKE 1 TABLET BY MOUTH  DAILY Ria Bush, MD Taking Active   omeprazole (PRILOSEC) 40 MG capsule [id number] Yes Take 1 capsule (40 mg total) by mouth daily.  Patient taking differently: Take 40 mg by mouth daily as needed.   Ria Bush, MD Taking Active Self            Patient Active Problem List   Diagnosis Date Noted   Vertigo 06/06/2020   Bilateral hearing loss 04/03/2020   Nasal sinus congestion 11/14/2019   Syncope and collapse 11/14/2019   Non-restorative sleep 11/14/2019   Rosacea 11/14/2019   Shoulder strain, right, initial encounter 08/17/2019   Varicose veins of right lower extremity with inflammation 02/15/2019   Lumbar back pain with radiculopathy affecting right lower extremity 02/02/2018   Chronic cough 09/20/2017   Lesion of lip 05/19/2017   Right lower quadrant abdominal pain 04/21/2017   Lactose intolerance 04/21/2017   Fatty liver disease, nonalcoholic 23/55/7322   Abdominal discomfort, epigastric 11/26/2016   Low vitamin B12 level 06/24/2016   Thoracic radiculitis 06/04/2016   Advanced care planning/counseling discussion 12/25/2015   Arthralgia 04/17/2015   Abnormal CXR 01/17/2015   Health maintenance examination 01/02/2015   Snoring 01/02/2015   Leg cramps 12/27/2013   Skin growth 09/25/2013   Allergic rhinitis 02/54/2706   History of Helicobacter pylori infection 01/15/2013   Medicare annual wellness visit, subsequent 09/14/2012   Osteopenia 06/01/2012   Hypertension 11/29/2011   Glaucoma 11/29/2011   Type 2 diabetes, controlled, with  peripheral neuropathy ([place]) 07/22/2011   Hypothyroidism 07/22/2011    Immunization History  Administered Date(s) Administered   Fluad Quad(high Dose 65+) 10/19/2018, 10/16/2019, 11/29/2019   Influenza Split 11/29/2011   Influenza, High Dose Seasonal PF 12/13/2014   Influenza,inj,Quad PF,6+ Mos 12/15/2012, 11/21/2013, 11/13/2015, 10/26/2016, 11/24/2017   PFIZER(Purple Top)SARS-COV-2 Vaccination 02/22/2019, 03/15/2019, 11/19/2019   Pneumococcal Conjugate-13 03/23/2013   Pneumococcal Polysaccharide-23 07/22/1998, 12/31/2011    Conditions to be addressed/monitored:  Hypertension, Hyperlipidemia, and Diabetes  Care Plan : [place]  Updates made by Debbora Dus, [place] since 11/20/2020 12:00 AM     Problem: CHL AMB "PATIENT-SPECIFIC PROBLEM"   Onset Date: 11/04/2020  Note:     Current Barriers:  Due for diabetic eye exam  Pharmacist Clinical Goal(s):  Patient will contact provider office for questions/concerns as evidenced notation of same in electronic health record through collaboration with PharmD and provider.   Interventions: 1:1 collaboration with Ria Bush, MD regarding development and update of comprehensive plan of care as evidenced by provider attestation and co-signature Inter-disciplinary care team collaboration (see longitudinal plan of care) Comprehensive medication review performed; medication list updated in electronic medical record  Hypertension (BP goal <140/90) -Controlled - per home readings  -Affirms adherence, husband and daughter are available to help right now as patient is on bed rest. -Current treatment: Losartan 100 mg - 1 tablet daily (dose increased 09/2020) Atenolol 50 mg - 1 tablet daily -Medications previously tried: none  -Current home readings: Home Blood Pressure - 132/69 on 10/28/2020     130/76 on 10/27/2020 -Current dietary habits: not discussed -Current exercise habits: none, bed rest currently -Denies  hypotensive/hypertensive symptoms -Educated on BP goals and benefits of medications for prevention of heart attack, stroke and kidney damage; Symptoms of hypotension and importance of maintaining adequate hydration; -Counseled to  monitor BP at home once monthly or with abnormal symptoms, document, and provide log at future appointments -Recommended to continue current medication  Hyperlipidemia: (LDL goal < 100) -Controlled - LDL 63 -Atorvastatin weekly was d/c in chart due to patient not taking however, pt reports she is taking this so I added back to med list. Will have CMA follow up in 1 month to ensure adherence and patient has not had any trouble refilling. -Current treatment: Lipitor 20 mg - 1 tablet once weekly -Medications previously tried: Lipitor daily - hair loss  -Educated on Cholesterol goals;  -Recommended to continue current medication  Hypothyroidism: (Goal: TSH, T4 WNL, symptom control) -Controlled - per labs, patient report -Affirms adherence as prescribed  -Current treatment: Levothyroxine 112 mcg - 1 tablet 1 hour before food/other medications  -Medications previously tried:  None  -Recommended to continue current medication  Diabetes (A1c goal <7%) -Controlled, A1c 6.3% -Current medications: None -Medications previously tried: none  -Current home glucose readings - every morning fasting glucose: 100-105, 98, 137 (today) - bed bound right now due to surgery so sugars up some  post prandial glucose: none -Denies hypoglycemic/hyperglycemic symptoms -Educated on A1c and blood sugar goals; -Counseled to check feet daily and get yearly eye exams -Recommended to continue current medication  Allergies (Goal: Symptom control) -Controlled - per patient report -Pt reports she is taking Singulair despite chart says not taking  -Denies taking levocetirizine currently  -Current treatment  Montelukast 10 mg - 1 tablet daily -Medications previously tried: none reported   -Recommended to continue current medication  OTHER: OTCs - B12, vitamin D, vitamin C, Hair, Skin and Nails supplement, CoQ10 Omeprazole - PRN Tylenol only, does not take Advil  Patient Goals/Self-Care Activities Patient will:  - take medications as prescribed  Follow Up Plan: Telephone follow up appointment with care management team member scheduled for: -12 months CCM visit for medication review -1 month CCM cholesterol review (atorvastatin adherence)      Medication Assistance: None required.  Patient affirms current coverage meets needs.  Compliance/Adherence/Medication fill history: Care Gaps: Eye exam - due (pt reports up to date, asked her to bring records to next appt)  Star-Rating Drugs: Medication:                Last Fill:         Day Supply Losartan 75m            09/29/2020      90  [facility]   Patient's preferred [facility] is: [facility] [address] [address] [id number]: [phone]: [phone] Uses pill box? No - keeps them in visible location in pill bottles Pt endorses 100% compliance  We discussed: Benefits of medication synchronization, packaging and delivery as well as enhanced pharmacist oversight with [employer]. Patient decided to: Continue current medication management strategy  Care Plan and Follow Up Patient Decision:  Patient agrees to Care Plan and Follow-up.  MDebbora Dus PharmD Clinical Pharmacist [phone]

## 2020-11-14 DIAGNOSIS — E1142 Type 2 diabetes mellitus with diabetic polyneuropathy: Secondary | ICD-10-CM | POA: Diagnosis not present

## 2020-11-14 DIAGNOSIS — I10 Essential (primary) hypertension: Secondary | ICD-10-CM

## 2020-11-18 ENCOUNTER — Other Ambulatory Visit: Payer: Self-pay

## 2020-11-18 ENCOUNTER — Ambulatory Visit (INDEPENDENT_AMBULATORY_CARE_PROVIDER_SITE_OTHER): Payer: Medicare Other | Admitting: Podiatry

## 2020-11-18 ENCOUNTER — Encounter: Payer: Self-pay | Admitting: Podiatry

## 2020-11-18 DIAGNOSIS — M216X1 Other acquired deformities of right foot: Secondary | ICD-10-CM

## 2020-11-18 DIAGNOSIS — Z9889 Other specified postprocedural states: Secondary | ICD-10-CM

## 2020-11-18 DIAGNOSIS — M898X9 Other specified disorders of bone, unspecified site: Secondary | ICD-10-CM

## 2020-11-18 NOTE — Progress Notes (Signed)
Subjective:  Patient ID: [name], female    DOB: 07/19/1934,  MRN: [id number]  Chief Complaint  Patient presents with   Routine Post Op    POV #2 DOS 10/27/2020 RT FOOT CHIELECTOMY W/RT 3RD METATARSAL OTEOTOMY WITHOUT FIXATION    DOS: 10/27/2020 Procedure: Right third metatarsal floating osteotomy with right foot cheilectomy of the first MPJ  86 y.o. female returns for post-op check.  Patient states she is doing well.  Pain is well controlled with pain medication.  She has been weightbearing as tolerated cam boot to the partial heel.  She denies any nausea fever chills vomiting.  Bandages clean dry and intact  Review of Systems: Negative except as noted in the HPI. Denies N/V/F/Ch.  Past Medical History:  Diagnosis Date   Allergy    hay fever   Arthritis    Cataract    resolved with surgery   Chicken pox    Colon polyp    Diabetes mellitus 2008   Generalized headaches    thinks caused by glaucoma   Glaucoma    Hypertension    Right sided sciatica 07/03/2013   Thyroid disease    hypothyroidism   Urinary incontinence    UTI (urinary tract infection)     Current Outpatient Medications:    ascorbic acid (VITAMIN C) 500 MG tablet, Take 1 tablet (500 mg total) by mouth daily., Disp:  , Rfl:    atenolol (TENORMIN) 50 MG tablet, TAKE 1 TABLET BY MOUTH  DAILY, Disp: 90 tablet, Rfl: 3   Blood Glucose Monitoring Suppl (ONE TOUCH ULTRA SYSTEM KIT) W/DEVICE KIT, 1 kit by Does not apply route once., Disp: 1 each, Rfl: 0   Cholecalciferol (VITAMIN D3) 25 MCG (1000 UT) CAPS, Take 1 capsule (1,000 Units total) by mouth daily., Disp: 30 capsule, Rfl:    Coenzyme Q10 (COQ10) 100 MG CAPS, Take 1 capsule by mouth daily., Disp: , Rfl:    cyanocobalamin (V-R VITAMIN B-12) 500 MCG tablet, Take 1 tablet (500 mcg total) by mouth daily., Disp: , Rfl:    glucose blood (ONETOUCH ULTRA) test strip, USE TO CHECK BLOOD SUGAR ONCE DAILY, Disp: 100 strip, Rfl: 3   ibuprofen (ADVIL) 800 MG tablet,  Take 1 tablet (800 mg total) by mouth every 6 (six) hours as needed. (Patient not taking: Reported on 11/04/2020), Disp: 60 tablet, Rfl: 1   Lancet Device MISC, One touch delica - Use as directed, Disp: 1 each, Rfl: 1   Lancets (ONETOUCH DELICA PLUS [id number]) MISC, CHECK BLOOD SUGAR 1-2 TIMES DAILY AS NEEDED, Disp: 100 each, Rfl: 3   Lancets Misc. (ACCU-CHEK SOFTCLIX LANCET DEV) KIT, Use at home to test blood sugars daily., Disp: 1 kit, Rfl: 0   levocetirizine (XYZAL) 5 MG tablet, TAKE 1 TABLET(5 MG) BY MOUTH EVERY EVENING (Patient not taking: Reported on 11/04/2020), Disp: 30 tablet, Rfl: 5   levothyroxine (SYNTHROID) 112 MCG tablet, TAKE 1 TABLET BY MOUTH  DAILY, Disp: 90 tablet, Rfl: 2   losartan (COZAAR) 100 MG tablet, Take 1 tablet (100 mg total) by mouth daily., Disp: 90 tablet, Rfl: 3   montelukast (SINGULAIR) 10 MG tablet, TAKE 1 TABLET BY MOUTH  DAILY, Disp: 90 tablet, Rfl: 3   omeprazole (PRILOSEC) 40 MG capsule, Take 1 capsule (40 mg total) by mouth daily. (Patient taking differently: Take 40 mg by mouth daily as needed.), Disp: 90 capsule, Rfl: 3  Social History   Tobacco Use  Smoking Status Never  Smokeless Tobacco Never  Allergies  Allergen Reactions   Align [Acidophilus]     Worsened abd bloating, pressure   Augmentin [Amoxicillin-Pot Clavulanate]     Possible, had itchy rash   Cefprozil Swelling   Metformin And Related Other (See Comments)    Severe stomach pains, muscle aches   Timolol     Local reaction to eye drops   Objective:  There were no vitals filed for this visit. There is no height or weight on file to calculate BMI. Constitutional Well developed. Well nourished.  Vascular Foot warm and well perfused. Capillary refill normal to all digits.   Neurologic Normal speech. Oriented to person, place, and time. Epicritic sensation to light touch grossly present bilaterally.  Dermatologic Skin was completely reepithelialization.  No clinical signs of  dehiscence or infection noted  Orthopedic: No further tenderness to palpation noted about the surgical site.   Radiographs: 3 views of skeletally mature the right foot: Good correction alignment noted.  Reduction of the dorsal fleck sign noted at the first MPJ. Assessment:   1. Bony exostosis   2. Plantar flexed metatarsal bone of right foot   3. Status post foot surgery     Plan:  Patient was evaluated and treated and all questions answered.  S/p foot surgery right -Progressing as expected post-operatively. -XR: See above -WB Status: Weightbearing as tolerated in regular shoes -Sutures: Removed no clinical signs of dehiscence noted.  No complication noted. -Medications: None status post foot surgery -Clinically.  Patient has improved considerably.  Good correction alignment noted with reduction of hyperkeratotic lesion to the submetatarsal 3.  I will see her back in 6 weeks for final follow-up and x-rays.  No follow-ups on file.

## 2020-11-20 NOTE — Patient Instructions (Signed)
Dear [name],  It was a pleasure meeting you during our initial appointment on Sept 20, 2022. Below is a summary of the goals we discussed and components of chronic care management. Please contact me anytime with questions or concerns.   Visit Information Patient Care Plan: CCM Pharmacy Care Plan     Problem Identified: CHL AMB "PATIENT-SPECIFIC PROBLEM"   Onset Date: 11/04/2020  Note:     Current Barriers:  Due for diabetic eye exam  Pharmacist Clinical Goal(s):  Patient will contact provider office for questions/concerns as evidenced notation of same in electronic health record through collaboration with PharmD and provider.   Interventions: 1:1 collaboration with Ria Bush, MD regarding development and update of comprehensive plan of care as evidenced by provider attestation and co-signature Inter-disciplinary care team collaboration (see longitudinal plan of care) Comprehensive medication review performed; medication list updated in electronic medical record  Hypertension (BP goal <140/90) -Controlled - per home readings  -Affirms adherence, husband and daughter are available to help right now as patient is on bed rest. -Current treatment: Losartan 100 mg - 1 tablet daily (dose increased 09/2020) Atenolol 50 mg - 1 tablet daily -Medications previously tried: none  -Current home readings: Home Blood Pressure - 132/69 on 10/28/2020     130/76 on 10/27/2020 -Current dietary habits: not discussed -Current exercise habits: none, bed rest currently -Denies hypotensive/hypertensive symptoms -Educated on BP goals and benefits of medications for prevention of heart attack, stroke and kidney damage; Symptoms of hypotension and importance of maintaining adequate hydration; -Counseled to monitor BP at home once monthly or with abnormal symptoms, document, and provide log at future appointments -Recommended to continue current medication  Hyperlipidemia: (LDL goal <  100) -Controlled - LDL 63 -Atorvastatin weekly was d/c in chart due to patient not taking however, pt reports she is taking this so I added back to med list. Will have CMA follow up in 1 month to ensure adherence and patient has not had any trouble refilling. -Current treatment: Lipitor 20 mg - 1 tablet once weekly -Medications previously tried: Lipitor daily - hair loss  -Educated on Cholesterol goals;  -Recommended to continue current medication  Hypothyroidism: (Goal: TSH, T4 WNL, symptom control) -Controlled - per labs, patient report -Affirms adherence as prescribed  -Current treatment: Levothyroxine 112 mcg - 1 tablet 1 hour before food/other medications  -Medications previously tried:  None  -Recommended to continue current medication  Diabetes (A1c goal <7%) -Controlled, A1c 6.3% -Current medications: None -Medications previously tried: none  -Current home glucose readings - every morning fasting glucose: 100-105, 98, 137 (today) - bed bound right now due to surgery so sugars up some  post prandial glucose: none -Denies hypoglycemic/hyperglycemic symptoms -Educated on A1c and blood sugar goals; -Counseled to check feet daily and get yearly eye exams -Recommended to continue current medication  Allergies (Goal: Symptom control) -Controlled - per patient report -Pt reports she is taking Singulair despite chart says not taking  -Denies taking levocetirizine currently  -Current treatment  Montelukast 10 mg - 1 tablet daily -Medications previously tried: none reported  -Recommended to continue current medication  OTHER: OTCs - B12, vitamin D, vitamin C, Hair, Skin and Nails supplement, CoQ10 Omeprazole - PRN Tylenol only, does not take Advil  Patient Goals/Self-Care Activities Patient will:  - take medications as prescribed  Follow Up Plan: Telephone follow up appointment with care management team member scheduled for: -12 months CCM visit for medication review -1  month CCM cholesterol review (atorvastatin  adherence)     [name] was given information about Chronic Care Management services today including:  CCM service includes personalized support from designated clinical staff supervised by her physician, including individualized plan of care and coordination with other care providers 24/7 contact phone numbers for assistance for urgent and routine care needs. Standard insurance, coinsurance, copays and deductibles apply for chronic care management only during months in which we provide at least 20 minutes of these services. Most insurances cover these services at 100%, however patients may be responsible for any copay, coinsurance and/or deductible if applicable. This service may help you avoid the need for more expensive face-to-face services. Only one practitioner may furnish and bill the service in a calendar month. The patient may stop CCM services at any time (effective at the end of the month) by phone call to the office staff.  Patient agreed to services and verbal consent obtained.   The patient verbalized understanding of instructions, educational materials, and care plan provided today and declined offer to receive copy of patient instructions, educational materials, and care plan.   Debbora Dus, PharmD Clinical Pharmacist [facility] [phone]

## 2020-11-24 ENCOUNTER — Telehealth: Payer: Self-pay

## 2020-11-24 NOTE — Progress Notes (Signed)
     Chronic Care Management Pharmacy Assistant   Name: [name]  MRN: [id number] DOB: 1934-04-10  Reason for Encounter: General Adherence   Recent office visits:  None since last CCM contact  Recent consult visits:  11/04/2021 - Podiatry - Patient presented for post-op check of Right third metatarsal floating osteotomy with right foot cheilectomy of the first MPJ. Orders: [facility] visits:   None since last CCM contact  Medications: Outpatient Encounter Medications as of 11/24/2020  Medication Sig   ascorbic acid (VITAMIN C) 500 MG tablet Take 1 tablet (500 mg total) by mouth daily.   atenolol (TENORMIN) 50 MG tablet TAKE 1 TABLET BY MOUTH  DAILY   atorvastatin (LIPITOR) 20 MG tablet Take 20 mg by mouth once a week. Pt reports she is taking weekly 11/04/20   Blood Glucose Monitoring Suppl (ONE TOUCH ULTRA SYSTEM KIT) W/DEVICE KIT 1 kit by Does not apply route once.   Cholecalciferol (VITAMIN D3) 25 MCG (1000 UT) CAPS Take 1 capsule (1,000 Units total) by mouth daily.   Coenzyme Q10 (COQ10) 100 MG CAPS Take 1 capsule by mouth daily.   cyanocobalamin (V-R VITAMIN B-12) 500 MCG tablet Take 1 tablet (500 mcg total) by mouth daily.   glucose blood (ONETOUCH ULTRA) test strip USE TO CHECK BLOOD SUGAR ONCE DAILY   ibuprofen (ADVIL) 800 MG tablet Take 1 tablet (800 mg total) by mouth every 6 (six) hours as needed. (Patient not taking: Reported on 11/04/2020)   Lancet Device MISC One touch delica - Use as directed   Lancets (ONETOUCH DELICA PLUS [id number]) MISC CHECK BLOOD SUGAR 1-2 TIMES DAILY AS NEEDED   Lancets Misc. (ACCU-CHEK SOFTCLIX LANCET DEV) KIT Use at home to test blood sugars daily.   levocetirizine (XYZAL) 5 MG tablet TAKE 1 TABLET(5 MG) BY MOUTH EVERY EVENING (Patient not taking: Reported on 11/04/2020)   levothyroxine (SYNTHROID) 112 MCG tablet TAKE 1 TABLET BY MOUTH  DAILY   losartan (COZAAR) 100 MG tablet Take 1 tablet (100 mg total) by mouth  daily.   montelukast (SINGULAIR) 10 MG tablet TAKE 1 TABLET BY MOUTH  DAILY   omeprazole (PRILOSEC) 40 MG capsule Take 1 capsule (40 mg total) by mouth daily. (Patient taking differently: Take 40 mg by mouth daily as needed.)   No facility-administered encounter medications on file as of 11/24/2020.   Unsuccessful attempts to reach patient on 10/10, 10/14 and 10/17. Unsuccessful outreach. Will attempt contact next month.    Patient is not > 5 days past due for refill on the following medications per chart history:  Star Medications: Medication Name/mg Last Fill Days Supply Losartan $RemoveBeforeD'50mg'[id number]$   09/29/2020      90 Atorvastatin $RemoveBeforeD'20mg'nHaEwhiqsYzaOj$    09/04/2020 90  Care Gaps: Annual wellness visit in last year? Yes 03/31/2020 Most Recent BP reading: 150/80 on 09/29/2020  If Diabetic: Most recent A1C reading: 6.3 on 09/29/2020 Last eye exam / retinopathy screening: 01/2020  Last diabetic foot exam:12/2019   12/30/2020 Podiatry appointment  Debbora Dus, CPP notified  Marijean Niemann, [place] [phone]  Time Spent: 30 Minutes

## 2020-12-27 ENCOUNTER — Other Ambulatory Visit: Payer: Self-pay | Admitting: Family Medicine

## 2020-12-30 ENCOUNTER — Encounter: Payer: Medicare Other | Admitting: Podiatry

## 2020-12-30 ENCOUNTER — Other Ambulatory Visit: Payer: Self-pay

## 2020-12-30 ENCOUNTER — Ambulatory Visit (INDEPENDENT_AMBULATORY_CARE_PROVIDER_SITE_OTHER): Payer: Medicare Other | Admitting: Podiatry

## 2020-12-30 ENCOUNTER — Ambulatory Visit (INDEPENDENT_AMBULATORY_CARE_PROVIDER_SITE_OTHER): Payer: Medicare Other

## 2020-12-30 DIAGNOSIS — Z9889 Other specified postprocedural states: Secondary | ICD-10-CM

## 2020-12-30 DIAGNOSIS — M216X1 Other acquired deformities of right foot: Secondary | ICD-10-CM

## 2020-12-30 DIAGNOSIS — M898X9 Other specified disorders of bone, unspecified site: Secondary | ICD-10-CM

## 2020-12-30 NOTE — Progress Notes (Signed)
Subjective:  Patient ID: [name], female    DOB: 02/08/1935,  MRN: [id number]  Chief Complaint  Patient presents with   Routine Post Op     POV #2 DOS 10/27/2020 RT FOOT CHIELECTOMY W/RT 3RD METATARSAL OTEOTOMY WITHOUT FIXATION    DOS: 10/27/2020 Procedure: Right third metatarsal floating osteotomy with right foot cheilectomy of the first MPJ  86 y.o. female returns for post-op check.  Patient states she is doing well.  Pain is well controlled with pain medication.  She has been weightbearing as tolerated cam boot to the partial heel.  She denies any nausea fever chills vomiting.  Bandages clean dry and intact  Review of Systems: Negative except as noted in the HPI. Denies N/V/F/Ch.  Past Medical History:  Diagnosis Date   Allergy    hay fever   Arthritis    Cataract    resolved with surgery   Chicken pox    Colon polyp    Diabetes mellitus 2008   Generalized headaches    thinks caused by glaucoma   Glaucoma    Hypertension    Right sided sciatica 07/03/2013   Thyroid disease    hypothyroidism   Urinary incontinence    UTI (urinary tract infection)     Current Outpatient Medications:    ascorbic acid (VITAMIN C) 500 MG tablet, Take 1 tablet (500 mg total) by mouth daily., Disp:  , Rfl:    atenolol (TENORMIN) 50 MG tablet, TAKE 1 TABLET BY MOUTH  DAILY, Disp: 90 tablet, Rfl: 3   atorvastatin (LIPITOR) 20 MG tablet, TAKE 1 TABLET BY MOUTH ONCE A WEEK, Disp: 9 tablet, Rfl: 0   Blood Glucose Monitoring Suppl (ONE TOUCH ULTRA SYSTEM KIT) W/DEVICE KIT, 1 kit by Does not apply route once., Disp: 1 each, Rfl: 0   Cholecalciferol (VITAMIN D3) 25 MCG (1000 UT) CAPS, Take 1 capsule (1,000 Units total) by mouth daily., Disp: 30 capsule, Rfl:    Coenzyme Q10 (COQ10) 100 MG CAPS, Take 1 capsule by mouth daily., Disp: , Rfl:    cyanocobalamin (V-R VITAMIN B-12) 500 MCG tablet, Take 1 tablet (500 mcg total) by mouth daily., Disp: , Rfl:    glucose blood (ONETOUCH ULTRA) test strip,  USE TO CHECK BLOOD SUGAR ONCE DAILY, Disp: 100 strip, Rfl: 3   ibuprofen (ADVIL) 800 MG tablet, Take 1 tablet (800 mg total) by mouth every 6 (six) hours as needed. (Patient not taking: Reported on 11/04/2020), Disp: 60 tablet, Rfl: 1   Lancet Device MISC, One touch delica - Use as directed, Disp: 1 each, Rfl: 1   Lancets (ONETOUCH DELICA PLUS [id number]) MISC, CHECK BLOOD SUGAR 1-2 TIMES DAILY AS NEEDED, Disp: 100 each, Rfl: 3   Lancets Misc. (ACCU-CHEK SOFTCLIX LANCET DEV) KIT, Use at home to test blood sugars daily., Disp: 1 kit, Rfl: 0   levocetirizine (XYZAL) 5 MG tablet, TAKE 1 TABLET(5 MG) BY MOUTH EVERY EVENING (Patient not taking: Reported on 11/04/2020), Disp: 30 tablet, Rfl: 5   levothyroxine (SYNTHROID) 112 MCG tablet, TAKE 1 TABLET BY MOUTH  DAILY, Disp: 90 tablet, Rfl: 2   losartan (COZAAR) 100 MG tablet, Take 1 tablet (100 mg total) by mouth daily., Disp: 90 tablet, Rfl: 3   montelukast (SINGULAIR) 10 MG tablet, TAKE 1 TABLET BY MOUTH  DAILY, Disp: 90 tablet, Rfl: 3   omeprazole (PRILOSEC) 40 MG capsule, Take 1 capsule (40 mg total) by mouth daily. (Patient taking differently: Take 40 mg by mouth daily as needed.), Disp:  90 capsule, Rfl: 3  Social History   Tobacco Use  Smoking Status Never  Smokeless Tobacco Never    Allergies  Allergen Reactions   Align [Acidophilus]     Worsened abd bloating, pressure   Augmentin [Amoxicillin-Pot Clavulanate]     Possible, had itchy rash   Cefprozil Swelling   Metformin And Related Other (See Comments)    Severe stomach pains, muscle aches   Timolol     Local reaction to eye drops   Objective:  There were no vitals filed for this visit. There is no height or weight on file to calculate BMI. Constitutional Well developed. Well nourished.  Vascular Foot warm and well perfused. Capillary refill normal to all digits.   Neurologic Normal speech. Oriented to person, place, and time. Epicritic sensation to light touch grossly present  bilaterally.  Dermatologic Skin was completely reepithelialization.  No clinical signs of dehiscence or infection noted  Orthopedic: No further tenderness to palpation noted about the surgical site.   Radiographs: 3 views of skeletally mature the right foot: Good correction alignment noted.  Callus formation noted at the osteotomy site...  Slight elevation of the osteotomy site noted as indicated. Assessment:   1. Status post foot surgery   2. Bony exostosis   3. Plantar flexed metatarsal bone of right foot     Plan:  Patient was evaluated and treated and all questions answered.  S/p foot surgery right -Clinically healed at this time.  Patient has been able to transition to regular shoes without hurting the bottom of her foot.  Clinically I discussed shoe gear modification as well as insoles in extensive detail.  At this time if any foot and ankle issues were in the future of asked her to come see me.  She states understanding.  No follow-ups on file.

## 2020-12-31 ENCOUNTER — Telehealth: Payer: Self-pay

## 2020-12-31 NOTE — Progress Notes (Addendum)
Chronic Care Management Pharmacy Assistant   Name: [name]  MRN: [id number] DOB: 1934-09-26  Reason for Encounter: CCM (General Adherence)   Recent office visits:  None since last CCM contact  Recent consult visits:  12/30/2020 - Podiatry - Patient presented for post-op check for Right third metatarsal floating osteotomy with right foot cheilectomy of the first MPJ. Labs: DG Foot Complete Right. No medication changes.   Hospital visits:  None in previous 6 months  Medications: Outpatient Encounter Medications as of 12/31/2020  Medication Sig   ascorbic acid (VITAMIN C) 500 MG tablet Take 1 tablet (500 mg total) by mouth daily.   atenolol (TENORMIN) 50 MG tablet TAKE 1 TABLET BY MOUTH  DAILY   atorvastatin (LIPITOR) 20 MG tablet TAKE 1 TABLET BY MOUTH ONCE A WEEK   Blood Glucose Monitoring Suppl (ONE TOUCH ULTRA SYSTEM KIT) W/DEVICE KIT 1 kit by Does not apply route once.   Cholecalciferol (VITAMIN D3) 25 MCG (1000 UT) CAPS Take 1 capsule (1,000 Units total) by mouth daily.   Coenzyme Q10 (COQ10) 100 MG CAPS Take 1 capsule by mouth daily.   cyanocobalamin (V-R VITAMIN B-12) 500 MCG tablet Take 1 tablet (500 mcg total) by mouth daily.   glucose blood (ONETOUCH ULTRA) test strip USE TO CHECK BLOOD SUGAR ONCE DAILY   ibuprofen (ADVIL) 800 MG tablet Take 1 tablet (800 mg total) by mouth every 6 (six) hours as needed. (Patient not taking: Reported on 11/04/2020)   Lancet Device MISC One touch delica - Use as directed   Lancets (ONETOUCH DELICA PLUS [id number]) MISC CHECK BLOOD SUGAR 1-2 TIMES DAILY AS NEEDED   Lancets Misc. (ACCU-CHEK SOFTCLIX LANCET DEV) KIT Use at home to test blood sugars daily.   levocetirizine (XYZAL) 5 MG tablet TAKE 1 TABLET(5 MG) BY MOUTH EVERY EVENING (Patient not taking: Reported on 11/04/2020)   levothyroxine (SYNTHROID) 112 MCG tablet TAKE 1 TABLET BY MOUTH  DAILY   losartan (COZAAR) 100 MG tablet Take 1 tablet (100 mg total) by mouth daily.    montelukast (SINGULAIR) 10 MG tablet TAKE 1 TABLET BY MOUTH  DAILY   omeprazole (PRILOSEC) 40 MG capsule Take 1 capsule (40 mg total) by mouth daily. (Patient taking differently: Take 40 mg by mouth daily as needed.)   No facility-administered encounter medications on file as of 12/31/2020.   Contacted [name] on 01/02/2021 for general disease state and medication adherence call.   Patient is not > 5 days past due for refill on the following medications per chart history:  Star Medications: Medication Name/mg Last Fill Days Supply Atorvastatin 20 mg  11/28/2020 63   Losartan 100 mg  12/18/2020 90 Verified with [facility]  What concerns do you have about your medications? Patient states she does   The patient denies side effects with her medications.   How often do you forget or accidentally miss a dose? Never  Do you use a pillbox? No  Are you having any problems getting your medications from your pharmacy? No  Has the cost of your medications been a concern? No  Since last visit with CPP, the following interventions have been made: Continue efforts towards diet control.   The patient has not had an ED visit since last contact.   The patient reports the following problems with their health. Patient states on Tuesday 12/30/2020 she started feeling bad. Feeling like she has a cold. Symptoms are: Cough - productive - sometimes clear and sometimes green, raspy  throat with no pain. No voice for the past two days. No fever and no body/muscle aches. Patient took an at home Covid and Flu combo test and it came up negative. Patient has been taking Tylenol. Pt was encouraged to scheduled PCP visit.  Reports the following  concerns or questions for Michelle Adams, Pharm. D at this time. Patient is wondering if she can go down on her Losartan dosage. Patient did not have any BP readings so I asked for her to take her BP and log it from today until Wednesday 11/23. Advised I would call  patient back on 11/23 for her readings. Patient agreed.   Care Gaps: Annual wellness visit in last year? Yes 03/31/2020 Most Recent BP reading: 132/69 on 11/04/2020  If Diabetic: Most recent A1C reading:  6.3 09/29/2020 Last eye exam / retinopathy screening: 01/26/2019 Last diabetic foot exam: Up to date  No appointments scheduled within the next 30 days. AWV 04/07/2021  Michelle Adams, CPP notified  Amy Moss, RMA Clinical Pharmacy Assistant [phone]  I have reviewed the care management and care coordination activities outlined in this encounter and I am certifying that I agree with the content of this note. No further action required.  Michelle Adams, PharmD Clinical Pharmacist [facility] [place] [phone]        

## 2021-01-02 ENCOUNTER — Telehealth: Payer: Self-pay

## 2021-01-02 NOTE — Progress Notes (Addendum)
    Chronic Care Management Pharmacy Assistant   Name: [name]  MRN: [id number] DOB: [birth year]  Reason for Encounter: Blood Pressure   Patient called me at 3:42 stating her blood pressure was reading 209/97 with her arm cuff. Patient stated she the only OTC medication she was taking for her cough and cold was Tylenol and sugar-free cough drops. Patient stated she did take all her medications this morning as directed. Patient was not having shortness of breath, chest pain, numbness, vision changes or trouble speaking. Stated she had been hungry so she ate a sandwich then took her BP.  I advised patient to rest on the couch and we would retake her BP in 5 minutes. I called patient back and her BP at 3:59 was 166/81 by wrist and 172/84 by arm. After asking more questions I discovered patient had a cup of coffee before she took her blood pressure that was elevated. [name] advised to have patient rest and contact office if BP remains elevated above 160/100 after rechecking. Patient was accompanied by her husband who I could hear talking to her in the background.   Called patient back at 4:31. Blood pressure reading was: 190/99 on her arm. Patient stated she had not done anything in the 30 minutes but rest on the couch. [name] advised that the patient go to Urgent Care. Patient declined. She said she would wait a few hours and check it again. Advised patient to go to ED if any abnormal symptoms and BP > 180/120.   [name], CPP notified  [name], [place] Clinical Pharmacy Assistant [phone]

## 2021-01-14 NOTE — Telephone Encounter (Signed)
Amy, can you call and check on Amyria's blood pressure? I want to make sure its come down less than 150/90.   Debbora Dus, PharmD Clinical Pharmacist Practitioner [facility] [phone]

## 2021-01-15 NOTE — Telephone Encounter (Signed)
Laurita Quint. Is going to call patient this week. You do not need to call Amy.

## 2021-01-15 NOTE — Progress Notes (Deleted)
Chronic Care Management Pharmacy Note  01/15/2021 Name:  [name] MRN:  [id number] DOB:  02-21-1934  Summary: Initial CCM visit. Discussed recent med changes (losartan increased to 100 mg 09/2020). Patient home BP is now 130/70s with dose increase, doing well. DM, stable controlled on no medication. Eye exam due per chart/care gaps, pt reports up to date on exams. HLD, she reports she is taking the weekly dose atorvastatin now - added back to med list. Reviewed all medications, no concerns identified. No cost concerns.  Recommendations/Changes made from today's visit: Bring eye exam records to next visit   Plan: CMA follow up for atorvastatin adherence in 1 month (want to ensure she does not refill since order was prev discontinued), remind about diabetic eye exam records CCM visit 12 months    Subjective: [name] is an 86 y.o. year old female who is a primary patient of Ria Bush, MD.  The CCM team was consulted for assistance with disease management and care coordination needs.    Engaged with patient by telephone for follow up visit in response to provider referral for pharmacy case management and/or care coordination services.   Consent to Services:  The patient was given information about Chronic Care Management services, agreed to services, and gave verbal consent prior to initiation of services.  Please see initial visit note for detailed documentation.   Patient Care Team: Ria Bush, MD as PCP - General (Family Medicine) Karren Burly Deirdre Peer, MD as Referring Physician (Ophthalmology) Imagene Riches, CNM as Midwife (Obstetrics) Debbora Dus, [facility] as Pharmacist (Pharmacist)  Recent office visits:  09/29/2020 - Ria Bush, MD - Patient presented for DM visit. Changed: Increase Losartan to $RemoveBef'100mg'[id number]$  daily. Stop: Montelukast per patient decision. Follow up in 6 months.  06/06/2020 - Ria Bush, MD - Patient present for vertigo.  Discussed weaning off meclizine when vertigo resolves. 03/31/2020 - Ria Bush, MD - PCP Patient presented for [facility]. No medication changes follow up 6 months   Recent consult visits:  12/30/2020 - Podiatry - Patient presented for post-op check for Right third metatarsal floating osteotomy with right foot cheilectomy of the first MPJ. Labs: DG Foot Complete Right. No medication changes.  10/27/2020 - Podiatry - Orders only listed in chart. Medication ordered: Ibuprofen (ADVIL) 800 MG tablet and OxyCODONE-acetaminophen(PERCOCET/ROXICET) 5-325 MG tablet. No other information was noted in the chart. Patient states she is on bed rest for three weeks due to surgery on her foot yesterday (10/27/2020). Patient had corn and bunion surgery. She can not walk on her foot. Started: Ibuprofen (ADVIL) 800 MG tablet and OxyCODONE-acetaminophen. Patient stated she has only had two Oxycodone and she is not going to take any more.  08/05/2020 - Podiatry no medication changes 06/17/2020 - Podiatry no medication changes 06/09/2020 - Ophthalmology  no medication changes 03/26/2020 - Ophthalmology no medication changes   Hospital visits:  None in previous 6 months   Objective:  Lab Results  Component Value Date   CREATININE 0.91 03/28/2020   BUN 21 03/28/2020   GFR 58.48 (L) 03/28/2020   GFRNONAA >60 01/27/2018   GFRAA >60 01/27/2018   NA 141 03/28/2020   K 4.5 03/28/2020   CALCIUM 9.4 03/28/2020   CO2 31 03/28/2020   GLUCOSE 124 (H) 03/28/2020    Lab Results  Component Value Date/Time   HGBA1C 6.3 (A) 09/29/2020 11:34 AM   HGBA1C 6.8 (H) 03/28/2020 09:05 AM   HGBA1C 6.7 (A) 12/19/2019 10:36 AM   HGBA1C 6.6 (H)  06/15/2019 08:46 AM   GFR 58.48 (L) 03/28/2020 09:05 AM   GFR 63.93 09/18/2019 09:35 AM   MICROALBUR 2.2 (H) 08/13/2015 09:23 AM   MICROALBUR <0.7 04/17/2015 09:56 AM    Last diabetic Eye exam:  Lab Results  Component Value Date/Time   HMDIABEYEEXA No Retinopathy 01/15/2017 12:00 AM     Last diabetic Foot exam: 11/04/20 podiatry visit    Lab Results  Component Value Date   CHOL 127 03/28/2020   HDL 50.10 03/28/2020   LDLCALC 63 03/28/2020   TRIG 70.0 03/28/2020   CHOLHDL 3 03/28/2020    Hepatic Function Latest Ref Rng & Units 03/28/2020 06/15/2019 02/02/2019  Total Protein 6.0 - 8.3 g/dL 6.9 6.4 6.9  Albumin 3.5 - 5.2 g/dL 4.2 4.2 4.3  AST 0 - 37 U/L _0 ALT 0 - 35 U/L _1 Alk Phosphatase 39 - 117 U/L 63 66 72  Total Bilirubin 0.2 - 1.2 mg/dL 0.7 0.7 0.6  Bilirubin, Direct 0.0 - 0.3 mg/dL - - -    Lab Results  Component Value Date/Time   TSH 0.88 03/28/2020 09:05 AM   TSH 1.40 06/15/2019 08:46 AM   FREET4 0.81 01/31/2018 08:34 AM   FREET4 1.02 01/26/2017 10:14 AM    CBC Latest Ref Rng & Units 03/28/2020 01/27/2018 11/26/2016  WBC 4.0 - 10.5 K/uL 5.3 7.7 5.0  Hemoglobin 12.0 - 15.0 g/dL 14.1 14.1 14.1  Hematocrit 36.0 - 46.0 % 42.9 42.9 42.8  Platelets 150.0 - 400.0 K/uL 205.0 220 208.0    Lab Results  Component Value Date/Time   VD25OH 32.43 09/25/2013 09:48 AM    Clinical ASCVD: No  The ASCVD Risk score (Arnett DK, et al., 2019) failed to calculate for the following reasons:   The 2019 ASCVD risk score is only valid for ages 45 to 68    Depression screen PHQ 2/9 03/31/2020 02/02/2019 01/31/2018  Decreased Interest 0 0 0  Down, Depressed, Hopeless 0 0 0  PHQ - 2 Score 0 0 0  Altered sleeping - 0 0  Tired, decreased energy - 0 0  Change in appetite - 0 0  Feeling bad or failure about yourself  - 0 0  Trouble concentrating - 0 0  Moving slowly or fidgety/restless - 0 0  Suicidal thoughts - 0 0  PHQ-9 Score - 0 0  Difficult doing work/chores - Not difficult at all Not difficult at all  Some recent data might be hidden    Social History   Tobacco Use  Smoking Status Never  Smokeless Tobacco Never   BP Readings from Last 3 Encounters:  11/04/20 132/69  09/29/20 (!) 150/80  06/06/20 122/78   Pulse Readings from Last 3  Encounters:  09/29/20 70  06/06/20 (!) 53  06/05/20 (!) 57   Wt Readings from Last 3 Encounters:  09/29/20 161 lb 9 oz (73.3 kg)  06/06/20 170 lb (77.1 kg)  06/05/20 166 lb 0.1 oz (75.3 kg)   BMI Readings from Last 3 Encounters:  09/29/20 29.55 kg/m  06/06/20 31.09 kg/m  06/05/20 30.36 kg/m    Assessment/Interventions: Review of patient past medical history, allergies, medications, health status, including review of consultants reports, laboratory and other test data, was performed as part of comprehensive evaluation and provision of chronic care management services.   SDOH:  (Social Determinants of Health) assessments and interventions performed: Yes  SDOH Screenings   Alcohol Screen: Not on file  Depression (IOX7-3): Low Risk  PHQ-2 Score: 0  Financial Resource Strain: Low Risk    Difficulty of Paying Living Expenses: Not very hard  Food Insecurity: Not on file  Housing: Not on file  Physical Activity: Not on file  Social Connections: Not on file  Stress: Not on file  Tobacco Use: Low Risk    Smoking Tobacco Use: Never   Smokeless Tobacco Use: Never   Passive Exposure: Not on file  Transportation Needs: Not on file    CCM Care Plan  Allergies  Allergen Reactions   Align [Acidophilus]     Worsened abd bloating, pressure   Augmentin [Amoxicillin-Pot Clavulanate]     Possible, had itchy rash   Cefprozil Swelling   Metformin And Related Other (See Comments)    Severe stomach pains, muscle aches   Timolol     Local reaction to eye drops    Medications Reviewed Today     Reviewed by Debbora Dus, [facility] (Pharmacist) on 11/20/20 at 1054  Med List Status: <None>   Medication Order Taking? Sig Documenting Provider Last Dose Status Informant  ascorbic acid (VITAMIN C) 500 MG tablet [id number] Yes Take 1 tablet (500 mg total) by mouth daily. Ria Bush, MD Taking Active   atenolol (TENORMIN) 50 MG tablet [id number] Yes TAKE 1 TABLET BY MOUTH  DAILY  Ria Bush, MD Taking Active   atorvastatin (LIPITOR) 20 MG tablet [id number] Yes Take 20 mg by mouth once a week. Pt reports she is taking weekly 11/04/20 [provider] Taking Active Self  Blood Glucose Monitoring Suppl (ONE TOUCH ULTRA SYSTEM KIT) W/DEVICE KIT [id number] Yes 1 kit by Does not apply route once. Jackolyn Confer, MD Taking Active   Cholecalciferol (VITAMIN D3) 25 MCG (1000 UT) CAPS [id number] Yes Take 1 capsule (1,000 Units total) by mouth daily. Ria Bush, MD Taking Active   Coenzyme Q10 (COQ10) 100 MG CAPS [id number] Yes Take 1 capsule by mouth daily. [provider] Taking Active Self  cyanocobalamin (V-R VITAMIN B-12) 500 MCG tablet [id number] Yes Take 1 tablet (500 mcg total) by mouth daily. Ria Bush, MD Taking Active   glucose blood [facility] ULTRA) test strip [id number] Yes USE TO CHECK BLOOD SUGAR ONCE DAILY Ria Bush, MD Taking Active   ibuprofen (ADVIL) 800 MG tablet [id number] No Take 1 tablet (800 mg total) by mouth every 6 (six) hours as needed.  Patient not taking: Reported on 11/04/2020   Felipa Furnace, DPM Not Taking Active   Lancet Device MISC [id number] Yes One touch delica - Use as directed Ria Bush, MD Taking Active   Lancets (ONETOUCH DELICA PLUS [id number]) [place] [id number] Yes CHECK BLOOD SUGAR 1-2 TIMES DAILY AS NEEDED Ria Bush, MD Taking Active   Lancets Misc. ([place]) KIT [id number] Yes Use at home to test blood sugars daily. Ria Bush, MD Taking Active   levocetirizine (XYZAL) 5 MG tablet [id number] No TAKE 1 TABLET(5 MG) BY MOUTH EVERY EVENING  Patient not taking: Reported on 11/04/2020   Chesley Mires, MD Not Taking Active   levothyroxine (SYNTHROID) 112 MCG tablet [id number] Yes TAKE 1 TABLET BY MOUTH  DAILY Ria Bush, MD Taking Active   losartan (COZAAR) 100 MG tablet [id number] Yes Take 1 tablet (100 mg total) by mouth daily. Ria Bush, MD Taking  Active   montelukast (SINGULAIR) 10 MG tablet [id number] Yes TAKE 1 TABLET BY MOUTH  DAILY Ria Bush, MD Taking Active   omeprazole (PRILOSEC) 40 MG capsule [id number] Yes Take 1 capsule (40 mg total)  by mouth daily.  Patient taking differently: Take 40 mg by mouth daily as needed.   Ria Bush, MD Taking Active Self            Patient Active Problem List   Diagnosis Date Noted   Vertigo 06/06/2020   Bilateral hearing loss 04/03/2020   Nasal sinus congestion 11/14/2019   Syncope and collapse 11/14/2019   Non-restorative sleep 11/14/2019   Rosacea 11/14/2019   Shoulder strain, right, initial encounter 08/17/2019   Varicose veins of right lower extremity with inflammation 02/15/2019   Lumbar back pain with radiculopathy affecting right lower extremity 02/02/2018   Chronic cough 09/20/2017   Lesion of lip 05/19/2017   Right lower quadrant abdominal pain 04/21/2017   Lactose intolerance 04/21/2017   Fatty liver disease, nonalcoholic 37/34/2876   Abdominal discomfort, epigastric 11/26/2016   Low vitamin B12 level 06/24/2016   Thoracic radiculitis 06/04/2016   Advanced care planning/counseling discussion 12/25/2015   Arthralgia 04/17/2015   Abnormal CXR 01/17/2015   Health maintenance examination 01/02/2015   Snoring 01/02/2015   Leg cramps 12/27/2013   Skin growth 09/25/2013   Allergic rhinitis 81/15/7262   History of Helicobacter pylori infection 01/15/2013   Medicare annual wellness visit, subsequent 09/14/2012   Osteopenia 06/01/2012   Hypertension 11/29/2011   Glaucoma 11/29/2011   Type 2 diabetes, controlled, with peripheral neuropathy ([place]) 07/22/2011   Hypothyroidism 07/22/2011    Immunization History  Administered Date(s) Administered   Fluad Quad(high Dose 65+) 10/19/2018, 10/16/2019, 11/29/2019   Influenza Split 11/29/2011   Influenza, High Dose Seasonal PF 12/13/2014   Influenza,inj,Quad PF,6+ Mos 12/15/2012, 11/21/2013, 11/13/2015, 10/26/2016,  11/24/2017   PFIZER(Purple Top)SARS-COV-2 Vaccination 02/22/2019, 03/15/2019, 11/19/2019   Pneumococcal Conjugate-13 03/23/2013   Pneumococcal Polysaccharide-23 07/22/1998, 12/31/2011    Conditions to be addressed/monitored:  Hypertension, Hyperlipidemia, and Diabetes  There are no care plans that you recently modified to display for this patient.    Medication Assistance: None required.  Patient affirms current coverage meets needs.  Compliance/Adherence/Medication fill history: Care Gaps: Eye exam - due (pt reports up to date, asked her to bring records to next appt)  Star-Rating Drugs: Medication:                Last Fill:         Day Supply Losartan 89m            09/29/2020      90 PDC 98% Atorvastatin 20 mg 09/04/2020  90 PWilson100%  Patient's [facility] [address]Phone: [phone]: [phone] Uses pill box? No - keeps them in visible location in pill bottles Pt endorses 100% compliance  We discussed: Benefits of medication synchronization, packaging and delivery as well as enhanced pharmacist oversight with [employer]. Patient decided to: Continue current medication management strategy  Care Plan and Follow Up Patient Decision:  Patient agrees to Care Plan and Follow-up.  LCharlene Brooke PharmD, BCACP Clinical Pharmacist [phone]     Current Barriers:  Due for diabetic eye exam  Pharmacist Clinical Goal(s):  Patient will contact provider office for questions/concerns as evidenced notation of same in electronic health record through collaboration with PharmD and provider.   Interventions: 1:1 collaboration with GRia Bush MD regarding development and update of comprehensive plan of care as evidenced by provider attestation and co-signature Inter-disciplinary care  team  collaboration (see longitudinal plan of care) Comprehensive medication review performed; medication list updated in electronic medical record  Hypertension (BP goal <140/90) -Controlled - per home readings  -Affirms adherence, husband and daughter are available to help right now as patient is on bed rest. -Current home readings: *** -Current treatment: Losartan 100 mg - 1 tablet daily (dose increased 09/2020) Atenolol 50 mg - 1 tablet daily -Medications previously tried: none  -Current dietary habits: not discussed -Current exercise habits: none, bed rest currently -Denies hypotensive/hypertensive symptoms -Educated on BP goals and benefits of medications for prevention of heart attack, stroke and kidney damage; Symptoms of hypotension and importance of maintaining adequate hydration; -Counseled to monitor BP at home once monthly or with abnormal symptoms, document, and provide log at future appointments -Recommended to continue current medication  Hyperlipidemia: (LDL goal < 100) -Controlled - LDL 63 -Atorvastatin weekly was d/c in chart due to patient not taking however, pt reports she is taking this so I added back to med list. Will have CMA follow up in 1 month to ensure adherence and patient has not had any trouble refilling. -Current treatment: Atorvastatin 20 mg - 1 tablet once weekly -Medications previously tried: Lipitor daily - hair loss  -Educated on Cholesterol goals;  -Recommended to continue current medication  Hypothyroidism: (Goal: TSH, T4 WNL, symptom control) -Controlled - per labs, patient report -Affirms adherence as prescribed  -Current treatment: Levothyroxine 112 mcg - 1 tablet 1 hour before food/other medications  -Medications previously tried:  None  -Recommended to continue current medication  Diabetes (A1c goal <7%) -Controlled, A1c 6.3% -Current medications: None -Medications previously tried: none  -Current home glucose readings - every  morning fasting glucose: 100-105, 98, 137 (today) - bed bound right now due to surgery so sugars up some  post prandial glucose: none -Denies hypoglycemic/hyperglycemic symptoms -Educated on A1c and blood sugar goals; -Counseled to check feet daily and get yearly eye exams -Recommended to continue current medication  Allergies (Goal: Symptom control) -Controlled - per patient report -Pt reports she is taking Singulair despite chart says not taking  -Denies taking levocetirizine currently  -Current treatment  Montelukast 10 mg - 1 tablet daily -Medications previously tried: none reported  -Recommended to continue current medication  Health Maintenance -Vaccine gaps: Flu, Covid booster, Shingrix, TDAP   Patient Goals/Self-Care Activities Patient will:  - take medications as prescribed

## 2021-01-16 ENCOUNTER — Telehealth: Payer: Self-pay | Admitting: Pharmacist

## 2021-01-16 ENCOUNTER — Telehealth: Payer: Medicare Other

## 2021-01-16 NOTE — Telephone Encounter (Signed)
  Chronic Care Management   Outreach Note  01/16/2021 Name: [name] MRN: [id number] DOB: August 18, 1934  Referred by: Ria Bush, MD  Patient had a phone appointment scheduled with clinical pharmacist today.  An unsuccessful telephone outreach was attempted today. The patient was referred to the pharmacist for assistance with medications, care management and care coordination.   Patient will NOT be penalized in any way for missing a CCM appointment. The no-show fee does not apply.  If possible, a message was left to return call to: [phone] or to [facility].  Charlene Brooke, PharmD, BCACP Clinical Pharmacist [facility] [phone]

## 2021-02-02 ENCOUNTER — Telehealth: Payer: Self-pay

## 2021-02-02 NOTE — Progress Notes (Addendum)
Chronic Care Management Pharmacy Assistant   Name: [name]  MRN: [id number] DOB: Jul 04, 1934  Reason for Encounter: CCM (Hypertension Disease State)   Recent office visits:  None since last CCM contact  Recent consult visits:  None since last CCM contact  Hospital visits:  None in previous 6 months  Medications: Outpatient Encounter Medications as of 02/02/2021  Medication Sig   ascorbic acid (VITAMIN C) 500 MG tablet Take 1 tablet (500 mg total) by mouth daily.   atenolol (TENORMIN) 50 MG tablet TAKE 1 TABLET BY MOUTH  DAILY   atorvastatin (LIPITOR) 20 MG tablet TAKE 1 TABLET BY MOUTH ONCE A WEEK   Blood Glucose Monitoring Suppl (ONE TOUCH ULTRA SYSTEM KIT) W/DEVICE KIT 1 kit by Does not apply route once.   Cholecalciferol (VITAMIN D3) 25 MCG (1000 UT) CAPS Take 1 capsule (1,000 Units total) by mouth daily.   Coenzyme Q10 (COQ10) 100 MG CAPS Take 1 capsule by mouth daily.   cyanocobalamin (V-R VITAMIN B-12) 500 MCG tablet Take 1 tablet (500 mcg total) by mouth daily.   glucose blood (ONETOUCH ULTRA) test strip USE TO CHECK BLOOD SUGAR ONCE DAILY   ibuprofen (ADVIL) 800 MG tablet Take 1 tablet (800 mg total) by mouth every 6 (six) hours as needed. (Patient not taking: Reported on 11/04/2020)   Lancet Device MISC One touch delica - Use as directed   Lancets (ONETOUCH DELICA PLUS [id number]) MISC CHECK BLOOD SUGAR 1-2 TIMES DAILY AS NEEDED   Lancets Misc. (ACCU-CHEK SOFTCLIX LANCET DEV) KIT Use at home to test blood sugars daily.   levocetirizine (XYZAL) 5 MG tablet TAKE 1 TABLET(5 MG) BY MOUTH EVERY EVENING (Patient not taking: Reported on 11/04/2020)   levothyroxine (SYNTHROID) 112 MCG tablet TAKE 1 TABLET BY MOUTH  DAILY   losartan (COZAAR) 100 MG tablet Take 1 tablet (100 mg total) by mouth daily.   montelukast (SINGULAIR) 10 MG tablet TAKE 1 TABLET BY MOUTH  DAILY   omeprazole (PRILOSEC) 40 MG capsule Take 1 capsule (40 mg total) by mouth daily. (Patient taking  differently: Take 40 mg by mouth daily as needed.)   No facility-administered encounter medications on file as of 02/02/2021.   Recent Office Vitals: BP Readings from Last 3 Encounters:  11/04/20 132/69  09/29/20 (!) 150/80  06/06/20 122/78   Pulse Readings from Last 3 Encounters:  09/29/20 70  06/06/20 (!) 53  06/05/20 (!) 57    Wt Readings from Last 3 Encounters:  09/29/20 161 lb 9 oz (73.3 kg)  06/06/20 170 lb (77.1 kg)  06/05/20 166 lb 0.1 oz (75.3 kg)    Kidney Function Lab Results  Component Value Date/Time   CREATININE 0.91 03/28/2020 09:05 AM   CREATININE 0.85 09/18/2019 09:35 AM   CREATININE 1.00 01/04/2012 12:10 PM   GFR 58.48 (L) 03/28/2020 09:05 AM   GFRNONAA >60 01/27/2018 12:07 AM   GFRNONAA 55 (L) 01/04/2012 12:10 PM   GFRAA >60 01/27/2018 12:07 AM   GFRAA >60 01/04/2012 12:10 PM   BMP Latest Ref Rng & Units 03/28/2020 09/18/2019 06/15/2019  Glucose 70 - 99 mg/dL 124(H) 122(H) 124(H)  BUN 6 - 23 mg/dL 21 19 17   Creatinine 0.40 - 1.20 mg/dL 0.91 0.85 0.85  Sodium 135 - 145 mEq/L 141 139 142  Potassium 3.5 - 5.1 mEq/L 4.5 4.2 4.6  Chloride 96 - 112 mEq/L 103 104 105  CO2 19 - 32 mEq/L 31 32 31  Calcium 8.4 - 10.5 mg/dL 9.4 9.4 9.2  Contacted patient on 02/02/2021 to discuss hypertension disease state  Current antihypertensive regimen:  Losartan 100 mg - 1 tablet daily (dose increased 09/2020) Atenolol 50 mg - 1 tablet daily  Patient verbally confirms she is taking the above medications as directed. Yes  How often are you checking your Blood Pressure? infrequently - Patient states she has not been taking her blood pressure. She states she has three different machines and gets a different reading with all three. Patient took her blood pressure while I was on the phone. Reading was: 170/92 with her arm cuff; 160/96 with her wrist cuff. I have asked patient to take her readings for the next three days and Charlene Brooke will call her on 12/22 for her log.  Patient understood and agreed. I advised patient to take her readings first thing when she wakes up before she drinks any coffee. Patient agreed.   Wrist or arm cuff: Arm cuff and wrist - patient states she has three machines and gets three different readings with them.  Caffeine intake: Patient drinks coffee - 2 cups per day Salt intake: watches how much salt she uses.  OTC medications including pseudoephedrine or NSAIDs? Tylenol 500 mg - two tablets at bed time for leg pain.   Any readings above 180/120? No  What recent interventions/DTPs have been made by any provider to improve Blood Pressure control since last CPP Visit: No recent interventions  Any recent hospitalizations or ED visits since last visit with CPP? No  What diet changes have been made to improve Blood Pressure Control?  Patient is not watching what she eats.   What exercise is being done to improve your Blood Pressure Control?  Patient has not been riding her bike; she used to, but has not lately.   Adherence Review: Is the patient currently on ACE/ARB medication? Yes Does the patient have >5 day gap between last estimated fill dates? No  Star Rating Drugs:  Medication:  Last Fill: Day Supply Atorvastatin 20 mg 01/12/2021      63                     Losartan 100 mg 12/18/2020      90 Fill dates verified with [facility]  Care Gaps: Annual wellness visit in last year? Yes 03/31/2020 Most Recent BP reading: 132/69 11/04/2020  If Diabetic: Most recent A1C reading: 6.3 09/29/2020 Last eye exam / retinopathy screening: 01/26/2019 Last diabetic foot exam: Up to date  Appointment on 02/05/2021 at 11:00am with Charlene Brooke for Hypertension Disease State.   Charlene Brooke, CPP notified  Marijean Niemann, [place] Clinical Pharmacy Assistant [phone]  Time Spent: 35 Minutes

## 2021-02-05 ENCOUNTER — Other Ambulatory Visit: Payer: Self-pay

## 2021-02-05 ENCOUNTER — Ambulatory Visit (INDEPENDENT_AMBULATORY_CARE_PROVIDER_SITE_OTHER): Payer: Medicare Other | Admitting: Pharmacist

## 2021-02-05 ENCOUNTER — Telehealth: Payer: Self-pay | Admitting: Pharmacist

## 2021-02-05 DIAGNOSIS — I10 Essential (primary) hypertension: Secondary | ICD-10-CM

## 2021-02-05 DIAGNOSIS — E1142 Type 2 diabetes mellitus with diabetic polyneuropathy: Secondary | ICD-10-CM

## 2021-02-05 MED ORDER — AMLODIPINE BESYLATE 5 MG PO TABS: 5.0000 mg | ORAL_TABLET | Freq: Every day | ORAL | 6 refills | Status: DC

## 2021-02-05 NOTE — Patient Instructions (Signed)
Visit Information  Phone number for Pharmacist: [phone]   Goals Addressed             This Visit's Progress    Track and Manage My Blood Pressure-Hypertension       Timeframe:  Long-Range Goal Priority:  High Start Date:     02/05/21                        Expected End Date:   02/05/22                    Follow Up Date Jan 2023   - check blood pressure daily - choose a place to take my blood pressure (home, clinic or office, retail store) - write blood pressure results in a log or diary    Why is this important?   You won't feel high blood pressure, but it can still hurt your blood vessels.  High blood pressure can cause heart or kidney problems. It can also cause a stroke.  Making lifestyle changes like losing a little weight or eating less salt will help.  Checking your blood pressure at home and at different times of the day can help to control blood pressure.  If the doctor prescribes medicine remember to take it the way the doctor ordered.  Call the office if you cannot afford the medicine or if there are questions about it.     Notes:         Care Plan : CCM Pharmacy Care Plan  Updates made by Charlton Haws, RPH since 02/05/2021 12:00 AM     Problem: Hypertension, Hyperlipidemia, and Diabetes   Onset Date: 11/04/2020     Long-Range Goal: Disease mgmt   Start Date: 02/05/2021  Expected End Date: 02/05/2022  This Visit's Progress: On track  Priority: High  Note:   Current Barriers:  Unable to independently monitor therapeutic efficacy Unable to maintain control of BP  Pharmacist Clinical Goal(s):  Patient will achieve adherence to monitoring guidelines and medication adherence to achieve therapeutic efficacy achieve improvement in BP as evidenced by home readings through collaboration with PharmD and provider.   Interventions: 1:1 collaboration with Ria Bush, MD regarding development and update of comprehensive plan of care as  evidenced by provider attestation and co-signature Inter-disciplinary care team collaboration (see longitudinal plan of care) Comprehensive medication review performed; medication list updated in electronic medical record  Hypertension (BP goal <140/90) -Uncontrolled  - per home readings; pt denies dizziness, headaches and otherwise reports feeling normal; pt reports she is not able to go to [place] office for appt any time soon -Current home readings:  12/20: 155/85 (arm); wrist (143/84);  12/21: 176/99 (arm1); 163/95 (arm2) 12/22: 181/98 (arm1); 169/94 (arm2) -Current treatment: Losartan 100 mg - 1 tablet daily (dose increased 09/2020) Atenolol 50 mg - 1 tablet daily -Medications previously tried: HCTZ  -Educated on BP goals and benefits of medications for prevention of heart attack, stroke and kidney damage;Symptoms of hypotension and importance of maintaining adequate hydration; -Counseled to monitor BP at home daily, document in log -Discussed losartan is at max dose, atenolol is weak BP med so need to add another medication to control BP;  -Recommended adding amlodipine 5 mg daily  Hyperlipidemia: (LDL goal < 100) -Controlled - LDL 63 -Current treatment: Atorvastatin 20 mg - 1 tablet once weekly -Medications previously tried: Lipitor daily - hair loss  -Educated on Cholesterol goals;  -Recommended to continue current medication  Diabetes (A1c goal <7%) -Diet-Controlled, A1c 6.3% -Medications previously tried: none  -Current home glucose readings - every morning fasting glucose: 100-105 -Educated on A1c and blood sugar goals  Health Maintenance -Vaccine gaps: Flu, Covid booster, Shingrix, TDAP  Patient Goals/Self-Care Activities Patient will:  - take medications as prescribed as evidenced by patient report and record review focus on medication adherence by routine check glucose daily, document, and provide at future appointments check blood pressure daily, document,  and provide at future appointments -Start amlodipine 5 mg daily      Patient verbalizes understanding of instructions provided today and agrees to view in [place].  Telephone follow up appointment with pharmacy team member scheduled for: 1 month  Charlene Brooke, PharmD, [facility] Clinical Pharmacist [facility] at [facility] [phone]

## 2021-02-05 NOTE — Telephone Encounter (Signed)
Patient has been checking BP at home using 2 different arm cuffs and a wrist cuff. All readings have been elevated. Pt denies dizziness, headaches and reports she feels normal. She endorses compliance with losartan 100 mg and atenolol 50 mg daily in the morning.  She denies NSAID use. She does drink coffee 1-2 times daily. She reports some back pain controlled relatively well with Tylenol.  Reported readings: 12/20: 155/85 (arm); 143/84 (wrist) 12/21: 176/99 (arm1); 163/95 (arm2) 12/22: 181/98 (arm1); 169/94 (arm2)  I recommended appt with PCP to evaluate BP in office, however pt reports she cannot make it to [facility]) any time soon.  Losartan is at maximum therapeutic dose and atenolol is not a very strong BP medication. Pt is agreeable to adding a third BP medication until she can f/u with PCP for physical in February.  Recommend amlodipine 5 mg daily and continued home monitoring. F/u call to check BP in a week.  Preferred pharmacy: [id number] [address], [place] AT [facility] OF [address] [place] [address] Phone: [phone] Fax: [phone]  Forwarding recommendations to PCP.

## 2021-02-05 NOTE — Telephone Encounter (Signed)
Ok to add amlodipine 5mg  daily in addition to current regimen. If SBP starts dropping <120 or symptomatic (orthostatic dizziness), rec she drop atenolol dose to 1/2 tablet daily.

## 2021-02-05 NOTE — Progress Notes (Signed)
Chronic Care Management Pharmacy Note  02/05/2021 Name:  [name] MRN:  [id number] DOB:  01-Feb-1935  Summary: -Pt reports elevated BP at home (using 2 different arm cuffs and wrist cuff); she denies dizziness, headaches and reports otherwise feeling normal  Recommendations/Changes made from today's visit: -Recommend adding amlodipine 5 mg daily - see telephone note in chart  Plan: -[place] will call patient 1 week for BP readings  -Pharmacist follow up televisit scheduled for 1 month   Subjective: [name] is an 86 y.o. year old female who is a primary patient of Ria Bush, MD.  The CCM team was consulted for assistance with disease management and care coordination needs.    Engaged with patient by telephone for follow up visit in response to provider referral for pharmacy case management and/or care coordination services.   Consent to Services:  The patient was given information about Chronic Care Management services, agreed to services, and gave verbal consent prior to initiation of services.  Please see initial visit note for detailed documentation.   Patient Care Team: Ria Bush, MD as PCP - General (Family Medicine) Karren Burly Deirdre Peer, MD as Referring Physician (Ophthalmology) Imagene Riches, CNM as Midwife (Obstetrics) Debbora Dus, [facility] as Pharmacist (Pharmacist)  Recent office visits:  09/29/2020 - Ria Bush, MD - Patient presented for DM visit. Changed: Increase Losartan to 173m daily. Stop: Montelukast per patient decision. Follow up in 6 months.  06/06/2020 - JRia Bush MD - Patient present for vertigo. Discussed weaning off meclizine when vertigo resolves. 03/31/2020 - JRia Bush MD - [facility] medication changes follow up 6 months   Recent consult visits:  12/30/2020 - Podiatry - Patient presented for post-op check for Right third metatarsal floating osteotomy with  right foot cheilectomy of the first MPJ. Labs: DG Foot Complete Right. No medication changes.  10/27/2020 - Podiatry - Orders only listed in chart. Medication ordered: Ibuprofen (ADVIL) 800 MG tablet and OxyCODONE-acetaminophen(PERCOCET/ROXICET) 5-325 MG tablet. No other information was noted in the chart. Patient states she is on bed rest for three weeks due to surgery on her foot yesterday (10/27/2020). Patient had corn and bunion surgery. She can not walk on her foot. Started: Ibuprofen (ADVIL) 800 MG tablet and OxyCODONE-acetaminophen. Patient stated she has only had two Oxycodone and she is not going to take any more.  08/05/2020 - Podiatry no medication changes 06/17/2020 - Podiatry no medication changes 06/09/2020 - Ophthalmology  no medication changes 03/26/2020 - Ophthalmology no medication changes   Hospital visits:  None in previous 6 months   Objective:  Lab Results  Component Value Date   CREATININE 0.91 03/28/2020   BUN 21 03/28/2020   GFR 58.48 (L) 03/28/2020   GFRNONAA >60 01/27/2018   GFRAA >60 01/27/2018   NA 141 03/28/2020   K 4.5 03/28/2020   CALCIUM 9.4 03/28/2020   CO2 31 03/28/2020   GLUCOSE 124 (H) 03/28/2020    Lab Results  Component Value Date/Time   HGBA1C 6.3 (A) 09/29/2020 11:34 AM   HGBA1C 6.8 (H) 03/28/2020 09:05 AM   HGBA1C 6.7 (A) 12/19/2019 10:36 AM   HGBA1C 6.6 (H) 06/15/2019 08:46 AM   GFR 58.48 (L) 03/28/2020 09:05 AM   GFR 63.93 09/18/2019 09:35 AM   MICROALBUR 2.2 (H) 08/13/2015 09:23 AM   MICROALBUR <0.7 04/17/2015 09:56 AM    Last diabetic Eye exam:  Lab Results  Component Value Date/Time   HMDIABEYEEXA No Retinopathy 01/15/2017 12:00  AM    Last diabetic Foot exam: 11/04/20 podiatry visit    Lab Results  Component Value Date   CHOL 127 03/28/2020   HDL 50.10 03/28/2020   LDLCALC 63 03/28/2020   TRIG 70.0 03/28/2020   CHOLHDL 3 03/28/2020    Hepatic Function Latest Ref Rng & Units 03/28/2020 06/15/2019 02/02/2019  Total  Protein 6.0 - 8.3 g/dL 6.9 6.4 6.9  Albumin 3.5 - 5.2 g/dL 4.2 4.2 4.3  AST 0 - 37 U/L 19 19 18   ALT 0 - 35 U/L 30 30 29   Alk Phosphatase 39 - 117 U/L 63 66 72  Total Bilirubin 0.2 - 1.2 mg/dL 0.7 0.7 0.6  Bilirubin, Direct 0.0 - 0.3 mg/dL - - -    Lab Results  Component Value Date/Time   TSH 0.88 03/28/2020 09:05 AM   TSH 1.40 06/15/2019 08:46 AM   FREET4 0.81 01/31/2018 08:34 AM   FREET4 1.02 01/26/2017 10:14 AM    CBC Latest Ref Rng & Units 03/28/2020 01/27/2018 11/26/2016  WBC 4.0 - 10.5 K/uL 5.3 7.7 5.0  Hemoglobin 12.0 - 15.0 g/dL 14.1 14.1 14.1  Hematocrit 36.0 - 46.0 % 42.9 42.9 42.8  Platelets 150.0 - 400.0 K/uL 205.0 220 208.0    Lab Results  Component Value Date/Time   VD25OH 32.43 09/25/2013 09:48 AM    Clinical ASCVD: No  The ASCVD Risk score (Arnett DK, et al., 2019) failed to calculate for the following reasons:   The 2019 ASCVD risk score is only valid for ages 70 to 42    Depression screen PHQ 2/9 03/31/2020 02/02/2019 01/31/2018  Decreased Interest 0 0 0  Down, Depressed, Hopeless 0 0 0  PHQ - 2 Score 0 0 0  Altered sleeping - 0 0  Tired, decreased energy - 0 0  Change in appetite - 0 0  Feeling bad or failure about yourself  - 0 0  Trouble concentrating - 0 0  Moving slowly or fidgety/restless - 0 0  Suicidal thoughts - 0 0  PHQ-9 Score - 0 0  Difficult doing work/chores - Not difficult at all Not difficult at all  Some recent data might be hidden    Social History   Tobacco Use  Smoking Status Never  Smokeless Tobacco Never   BP Readings from Last 3 Encounters:  11/04/20 132/69  09/29/20 (!) 150/80  06/06/20 122/78   Pulse Readings from Last 3 Encounters:  09/29/20 70  06/06/20 (!) 53  06/05/20 (!) 57   Wt Readings from Last 3 Encounters:  09/29/20 161 lb 9 oz (73.3 kg)  06/06/20 170 lb (77.1 kg)  06/05/20 166 lb 0.1 oz (75.3 kg)   BMI Readings from Last 3 Encounters:  09/29/20 29.55 kg/m  06/06/20 31.09 kg/m  06/05/20 30.36  kg/m    Assessment/Interventions: Review of patient past medical history, allergies, medications, health status, including review of consultants reports, laboratory and other test data, was performed as part of comprehensive evaluation and provision of chronic care management services.   SDOH:  (Social Determinants of Health) assessments and interventions performed: Yes  SDOH Screenings   Alcohol Screen: Not on file  Depression (PHQ2-9): Low Risk    PHQ-2 Score: 0  Financial Resource Strain: Low Risk    Difficulty of Paying Living Expenses: Not very hard  Food Insecurity: Not on file  Housing: Not on file  Physical Activity: Not on file  Social Connections: Not on file  Stress: Not on file  Tobacco Use: Low Risk  Smoking Tobacco Use: Never   Smokeless Tobacco Use: Never   Passive Exposure: Not on file  Transportation Needs: Not on file    CCM Care Plan  Allergies  Allergen Reactions   Align [Acidophilus]     Worsened abd bloating, pressure   Augmentin [Amoxicillin-Pot Clavulanate]     Possible, had itchy rash   Cefprozil Swelling   Metformin And Related Other (See Comments)    Severe stomach pains, muscle aches   Timolol     Local reaction to eye drops    Medications Reviewed Today     Reviewed by Charlton Haws, [facility] (Pharmacist) on 02/05/21 at 1158  Med List Status: <None>   Medication Order Taking? Sig Documenting Provider Last Dose Status Informant  amLODipine (NORVASC) 5 MG tablet [id number]  Take 1 tablet (5 mg total) by mouth daily. Ria Bush, MD  Active   ascorbic acid (VITAMIN C) 500 MG tablet [id number] Yes Take 1 tablet (500 mg total) by mouth daily. Ria Bush, MD Taking Active   atenolol (TENORMIN) 50 MG tablet [id number] Yes TAKE 1 TABLET BY MOUTH  DAILY Ria Bush, MD Taking Active   atorvastatin (LIPITOR) 20 MG tablet [id number] Yes TAKE 1 TABLET BY MOUTH ONCE A WEEK Ria Bush, MD Taking Active   Blood Glucose  Monitoring Suppl (ONE TOUCH ULTRA SYSTEM KIT) W/DEVICE KIT [id number] Yes 1 kit by Does not apply route once. Jackolyn Confer, MD Taking Active   Cholecalciferol (VITAMIN D3) 25 MCG (1000 UT) CAPS [id number] Yes Take 1 capsule (1,000 Units total) by mouth daily. Ria Bush, MD Taking Active   Coenzyme Q10 (COQ10) 100 MG CAPS [id number] Yes Take 1 capsule by mouth daily. [provider] Taking Active Self  cyanocobalamin (V-R VITAMIN B-12) 500 MCG tablet [id number] Yes Take 1 tablet (500 mcg total) by mouth daily. Ria Bush, MD Taking Active   glucose blood [facility] ULTRA) test strip [id number] Yes USE TO CHECK BLOOD SUGAR ONCE DAILY Ria Bush, MD Taking Active   Lancet Device [place] [id number] Yes One touch delica - Use as directed Ria Bush, MD Taking Active   Lancets (ONETOUCH DELICA PLUS [id number]) [place] [id number] Yes CHECK BLOOD SUGAR 1-2 TIMES DAILY AS NEEDED Ria Bush, MD Taking Active   Lancets Misc. ([place]) KIT [id number] Yes Use at home to test blood sugars daily. Ria Bush, MD Taking Active   levocetirizine (XYZAL) 5 MG tablet [id number] Yes TAKE 1 TABLET(5 MG) BY MOUTH EVERY Porfirio Oar, MD Taking Active   levothyroxine (SYNTHROID) 112 MCG tablet [id number] Yes TAKE 1 TABLET BY MOUTH  DAILY Ria Bush, MD Taking Active   losartan (COZAAR) 100 MG tablet [id number] Yes Take 1 tablet (100 mg total) by mouth daily. Ria Bush, MD Taking Active   montelukast (SINGULAIR) 10 MG tablet [id number] Yes TAKE 1 TABLET BY MOUTH  DAILY Ria Bush, MD Taking Active   omeprazole (PRILOSEC) 40 MG capsule [id number] Yes Take 1 capsule (40 mg total) by mouth daily.  Patient taking differently: Take 40 mg by mouth daily as needed.   Ria Bush, MD Taking Active Self            Patient Active Problem List   Diagnosis Date Noted   Vertigo 06/06/2020   Bilateral hearing loss 04/03/2020   Nasal  sinus congestion 11/14/2019   Syncope and collapse 11/14/2019   Non-restorative sleep 11/14/2019   Rosacea 11/14/2019   Shoulder strain, right, initial encounter 08/17/2019   Varicose veins of right  lower extremity with inflammation 02/15/2019   Lumbar back pain with radiculopathy affecting right lower extremity 02/02/2018   Chronic cough 09/20/2017   Lesion of lip 05/19/2017   Right lower quadrant abdominal pain 04/21/2017   Lactose intolerance 04/21/2017   Fatty liver disease, nonalcoholic 32/99/2426   Abdominal discomfort, epigastric 11/26/2016   Low vitamin B12 level 06/24/2016   Thoracic radiculitis 06/04/2016   Advanced care planning/counseling discussion 12/25/2015   Arthralgia 04/17/2015   Abnormal CXR 01/17/2015   Health maintenance examination 01/02/2015   Snoring 01/02/2015   Leg cramps 12/27/2013   Skin growth 09/25/2013   Allergic rhinitis 83/41/9622   History of Helicobacter pylori infection 01/15/2013   Medicare annual wellness visit, subsequent 09/14/2012   Osteopenia 06/01/2012   Hypertension 11/29/2011   Glaucoma 11/29/2011   Type 2 diabetes, controlled, with peripheral neuropathy ([place]) 07/22/2011   Hypothyroidism 07/22/2011    Immunization History  Administered Date(s) Administered   Fluad Quad(high Dose 65+) 10/19/2018, 10/16/2019, 11/29/2019   Influenza Split 11/29/2011   Influenza, High Dose Seasonal PF 12/13/2014   Influenza,inj,Quad PF,6+ Mos 12/15/2012, 11/21/2013, 11/13/2015, 10/26/2016, 11/24/2017   PFIZER(Purple Top)SARS-COV-2 Vaccination 02/22/2019, 03/15/2019, 11/19/2019   Pneumococcal Conjugate-13 03/23/2013   Pneumococcal Polysaccharide-23 07/22/1998, 12/31/2011    Conditions to be addressed/monitored:  Hypertension, Hyperlipidemia, and Diabetes  Care Plan : [place]  Updates made by Charlton Haws, [place] since 02/05/2021 12:00 AM     Problem: Hypertension, Hyperlipidemia, and Diabetes   Onset Date: 11/04/2020      Long-Range Goal: Disease mgmt   Start Date: 02/05/2021  Expected End Date: 02/05/2022  This Visit's Progress: On track  Priority: High  Note:   Current Barriers:  Unable to independently monitor therapeutic efficacy Unable to maintain control of BP  Pharmacist Clinical Goal(s):  Patient will achieve adherence to monitoring guidelines and medication adherence to achieve therapeutic efficacy achieve improvement in BP as evidenced by home readings through collaboration with PharmD and provider.   Interventions: 1:1 collaboration with Ria Bush, MD regarding development and update of comprehensive plan of care as evidenced by provider attestation and co-signature Inter-disciplinary care team collaboration (see longitudinal plan of care) Comprehensive medication review performed; medication list updated in electronic medical record  Hypertension (BP goal <140/90) -Uncontrolled  - per home readings; pt denies dizziness, headaches and otherwise reports feeling normal; pt reports she is not able to go to [place] office for appt any time soon -Current home readings:  12/20: 155/85 (arm); wrist (143/84);  12/21: 176/99 (arm1); 163/95 (arm2) 12/22: 181/98 (arm1); 169/94 (arm2) -Current treatment: Losartan 100 mg - 1 tablet daily (dose increased 09/2020) Atenolol 50 mg - 1 tablet daily -Medications previously tried: HCTZ  -Educated on BP goals and benefits of medications for prevention of heart attack, stroke and kidney damage;Symptoms of hypotension and importance of maintaining adequate hydration; -Counseled to monitor BP at home daily, document in log -Discussed losartan is at max dose, atenolol is weak BP med so need to add another medication to control BP;  -Recommended adding amlodipine 5 mg daily  Hyperlipidemia: (LDL goal < 100) -Controlled - LDL 63 -Current treatment: Atorvastatin 20 mg - 1 tablet once weekly -Medications previously tried: Lipitor daily - hair loss   -Educated on Cholesterol goals;  -Recommended to continue current medication  Diabetes (A1c goal <7%) -Diet-Controlled, A1c 6.3% -Medications previously tried: none  -Current home glucose readings - every morning fasting glucose: 100-105 -Educated on A1c and blood sugar goals  Health Maintenance -Vaccine gaps: Flu,  Covid booster, Shingrix, TDAP  Patient Goals/Self-Care Activities Patient will:  - take medications as prescribed as evidenced by patient report and record review focus on medication adherence by routine check glucose daily, document, and provide at future appointments check blood pressure daily, document, and provide at future appointments -Start amlodipine 5 mg daily       Medication Assistance: None required.  Patient affirms current coverage meets needs.  Compliance/Adherence/Medication fill history: Care Gaps: Eye exam - due (pt reports up to date, asked her to bring records to next appt)  Star-Rating Drugs: Medication:                Last Fill:         Day Supply Atorvastatin 20 mg      01/12/2021      63  PDC 100%                    Losartan 100 mg         12/18/2020      90 PDC 81% (inaccurate in chart) Fill dates verified with [facility] 02/02/21  Patient's preferred pharmacy is: [facility]  ([place], [place] [facility] [address]; [address] [place] [address] Phone: [phone] Fax: [phone]  Uses pill box? No - keeps them in visible location in pill bottles Pt endorses 100% compliance  We discussed: Benefits of medication synchronization, packaging and delivery as well as enhanced pharmacist oversight with [facility]. Patient decided to: Continue current medication management strategy  Care Plan and Follow Up Patient Decision:  Patient agrees to Care Plan and Follow-up.  Follow Up Plan: Telephone follow up appointment with care management team member scheduled for: 1  month  Charlene Brooke, PharmD, BCACP Clinical Pharmacist [facility] at [facility] [phone]

## 2021-02-10 ENCOUNTER — Other Ambulatory Visit: Payer: Self-pay | Admitting: Family Medicine

## 2021-02-12 ENCOUNTER — Ambulatory Visit: Payer: Self-pay | Admitting: Pharmacist

## 2021-02-12 ENCOUNTER — Telehealth: Payer: Self-pay

## 2021-02-12 DIAGNOSIS — I10 Essential (primary) hypertension: Secondary | ICD-10-CM

## 2021-02-12 DIAGNOSIS — E1142 Type 2 diabetes mellitus with diabetic polyneuropathy: Secondary | ICD-10-CM

## 2021-02-12 NOTE — Progress Notes (Signed)
° ° °  Chronic Care Management Pharmacy Assistant   Name: [name]  MRN: [id number] DOB: [birth year]  Reason for Encounter: CCM (Blood Pressure Log)   Called patient for her blood pressure log per Charlene Brooke. Patient started taking Amlodipine 5 mg on 02/05/2021. Patient states the Amlodipine is not giving her any side effects. Patient stated that she is sweating when she wakes up. She will wake up in the middle of the night or the morning and is sweating. Patient does not know what is causing it, but stated it has been happening on and off for a year. Patient does not feel it is due to Amlodipine as it was happening before she started taking it.   Blood pressure readings are as follows:  Date  Time  Blood Pressure Time  Blood Pressure 12/29  8:33 am 132/81   ---------  ----------- 12/28  9:50 am 111/73    3:30 pm 146/78  Patient misplaced her readings from before 12/28  Charlene Brooke, [place] notified  Marijean Niemann, [place] [phone]  Time Spent: 20 Minutes

## 2021-02-14 DIAGNOSIS — E1142 Type 2 diabetes mellitus with diabetic polyneuropathy: Secondary | ICD-10-CM

## 2021-02-14 DIAGNOSIS — I10 Essential (primary) hypertension: Secondary | ICD-10-CM | POA: Diagnosis not present

## 2021-02-17 ENCOUNTER — Ambulatory Visit: Payer: Medicare Other | Admitting: Podiatry

## 2021-02-17 NOTE — Progress Notes (Signed)
Updated blood pressure in chart with patient-reported home BP:  BP Readings from Last 3 Encounters:  02/12/21 132/81  11/04/20 132/69  09/29/20 (!) 150/80    Care Plan : [place]  Updates made by Charlton Haws, RPH since 02/17/2021 12:00 AM     Problem: Hypertension, Hyperlipidemia, and Diabetes   Onset Date: 11/04/2020     Long-Range Goal: Disease mgmt   Start Date: 02/05/2021  Expected End Date: 02/05/2022  Recent Progress: On track  Priority: High  Note:   Current Barriers:  Unable to independently monitor therapeutic efficacy Unable to maintain control of BP  Pharmacist Clinical Goal(s):  Patient will achieve adherence to monitoring guidelines and medication adherence to achieve therapeutic efficacy achieve improvement in BP as evidenced by home readings through collaboration with PharmD and provider.   Interventions: 1:1 collaboration with Ria Bush, MD regarding development and update of comprehensive plan of care as evidenced by provider attestation and co-signature Inter-disciplinary care team collaboration (see longitudinal plan of care) Comprehensive medication review performed; medication list updated in electronic medical record  Hypertension (BP goal <140/90) -Improved  - per home readings; pt denies dizziness, headaches and otherwise reports feeling normal; pt reports she is not able to go to [place] office for appt any time soon -Current home readings:  12/20: 155/85 (arm); wrist (143/84);  12/21: 176/99 (arm1); 163/95 (arm2) 12/22: 181/98 (arm1); 169/94 (arm2); added amlodipine 5 mg daily 12/29: 132/81 -Current treatment: Losartan 100 mg - 1 tablet daily (dose increased 09/2020) Atenolol 50 mg - 1 tablet daily Amlodipine 5 mg daily -Medications previously tried: HCTZ  -Educated on BP goals and benefits of medications for prevention of heart attack, stroke and kidney damage;Symptoms of hypotension and importance of maintaining  adequate hydration; -Counseled to monitor BP at home daily, document in log -Recommend to continue current medication  Hyperlipidemia: (LDL goal < 100) -Controlled - LDL 63 -Current treatment: Atorvastatin 20 mg - 1 tablet once weekly -Medications previously tried: Lipitor daily - hair loss  -Educated on Cholesterol goals;  -Recommended to continue current medication  Diabetes (A1c goal <7%) -Diet-Controlled, A1c 6.3% -Medications previously tried: none  -Current home glucose readings - every morning fasting glucose: 100-105 -Educated on A1c and blood sugar goals  Health Maintenance -Vaccine gaps: Flu, Covid booster, Shingrix, TDAP  Patient Goals/Self-Care Activities Patient will:  - take medications as prescribed as evidenced by patient report and record review focus on medication adherence by routine check glucose daily, document, and provide at future appointments check blood pressure daily, document, and provide at future appointments -Start amlodipine 5 mg daily     Charlton Haws, [facility]

## 2021-02-23 ENCOUNTER — Other Ambulatory Visit: Payer: Self-pay | Admitting: Family Medicine

## 2021-02-26 ENCOUNTER — Telehealth: Payer: Self-pay

## 2021-02-26 NOTE — Progress Notes (Signed)
° ° °  Chronic Care Management Pharmacy Assistant   Name: [name]  MRN: [id number] DOB: 1934-11-19  Reason for Encounter: CCM (Appointment Reminder)   Medications: Outpatient Encounter Medications as of 02/26/2021  Medication Sig   amLODipine (NORVASC) 5 MG tablet Take 1 tablet (5 mg total) by mouth daily.   ascorbic acid (VITAMIN C) 500 MG tablet Take 1 tablet (500 mg total) by mouth daily.   atenolol (TENORMIN) 50 MG tablet TAKE 1 TABLET BY MOUTH  DAILY   atorvastatin (LIPITOR) 20 MG tablet TAKE 1 TABLET BY MOUTH WEEKLY   Blood Glucose Monitoring Suppl (ONE TOUCH ULTRA SYSTEM KIT) W/DEVICE KIT 1 kit by Does not apply route once.   Cholecalciferol (VITAMIN D3) 25 MCG (1000 UT) CAPS Take 1 capsule (1,000 Units total) by mouth daily.   Coenzyme Q10 (COQ10) 100 MG CAPS Take 1 capsule by mouth daily.   cyanocobalamin (V-R VITAMIN B-12) 500 MCG tablet Take 1 tablet (500 mcg total) by mouth daily.   glucose blood (ONETOUCH ULTRA) test strip USE TO CHECK BLOOD SUGAR ONCE DAILY   Lancet Device MISC One touch delica - Use as directed   Lancets (ONETOUCH DELICA PLUS [id number]) MISC CHECK BLOOD SUGAR 1-2 TIMES DAILY AS NEEDED   Lancets Misc. (ACCU-CHEK SOFTCLIX LANCET DEV) KIT Use at home to test blood sugars daily.   levocetirizine (XYZAL) 5 MG tablet TAKE 1 TABLET(5 MG) BY MOUTH EVERY EVENING   levothyroxine (SYNTHROID) 112 MCG tablet TAKE 1 TABLET BY MOUTH  DAILY   losartan (COZAAR) 100 MG tablet Take 1 tablet (100 mg total) by mouth daily.   montelukast (SINGULAIR) 10 MG tablet TAKE 1 TABLET BY MOUTH  DAILY   omeprazole (PRILOSEC) 40 MG capsule Take 1 capsule (40 mg total) by mouth daily. (Patient taking differently: Take 40 mg by mouth daily as needed.)   No facility-administered encounter medications on file as of 02/26/2021.   [name] was contacted to remind of upcoming telephone visit with Charlene Brooke on 03/05/2021 at 1:00 pm. Patient was reminded to have all medications,  supplements and any blood glucose and blood pressure readings available for review at appointment. If unable to reach, a voicemail was left for patient.   Star Rating Drugs: Medication:  Last Fill: Day Supply Atorvastatin 20 mg 01/12/2021 90  Losartan 100 mg 12/18/2020 90  Verified fill dates with [facility]  Charlene Brooke, CPP notified  Marijean Niemann, [place] [phone]  Time Spent: 10 Minutes

## 2021-02-27 ENCOUNTER — Telehealth: Payer: Self-pay | Admitting: Family Medicine

## 2021-03-05 ENCOUNTER — Other Ambulatory Visit: Payer: Self-pay

## 2021-03-05 ENCOUNTER — Ambulatory Visit (INDEPENDENT_AMBULATORY_CARE_PROVIDER_SITE_OTHER): Payer: Medicare Other | Admitting: Pharmacist

## 2021-03-05 DIAGNOSIS — E1142 Type 2 diabetes mellitus with diabetic polyneuropathy: Secondary | ICD-10-CM

## 2021-03-05 DIAGNOSIS — I10 Essential (primary) hypertension: Secondary | ICD-10-CM

## 2021-03-05 NOTE — Progress Notes (Signed)
Chronic Care Management Pharmacy Note  03/05/2021 Name:  [name] MRN:  [id number] DOB:  [birth year]  Summary: -Home BP significantly improved after addition of amlodipine 5 mg last month  (BP 112/68 - 128/82); pt denies side effects  Recommendations/Changes made from today's visit: -No med changes  Plan: -[place] will call patient 2 months for BP readings  -Pharmacist follow up televisit scheduled for 4 months -PCP visit 06/10/21   Subjective: [name] is an 87 y.o. year old female who is a primary patient of Ria Bush, MD.  The CCM team was consulted for assistance with disease management and care coordination needs.    Engaged with patient by telephone for follow up visit in response to provider referral for pharmacy case management and/or care coordination services.   Consent to Services:  The patient was given information about Chronic Care Management services, agreed to services, and gave verbal consent prior to initiation of services.  Please see initial visit note for detailed documentation.   Patient Care Team: Ria Bush, MD as PCP - General (Family Medicine) Karren Burly Deirdre Peer, MD as Referring Physician (Ophthalmology) Imagene Riches, CNM as Midwife (Obstetrics) Debbora Dus, [facility] as Pharmacist (Pharmacist)  Recent office visits:  09/29/2020 - Ria Bush, MD - Patient presented for DM visit. Changed: Increase Losartan to 126m daily. Stop: Montelukast per patient decision. Follow up in 6 months.  06/06/2020 - JRia Bush MD - Patient present for vertigo. Discussed weaning off meclizine when vertigo resolves. 03/31/2020 - JRia Bush MD - [facility]CP [facility] changes follow up 6 months   Recent consult visits:  12/30/2020 - Podiatry - Patient presented for post-op check for Right third metatarsal floating osteotomy with right foot cheilectomy of the first MPJ. Labs: DG Foot  Complete Right. No medication changes.  10/27/2020 - Podiatry - Orders only listed in chart. Medication ordered: Ibuprofen (ADVIL) 800 MG tablet and OxyCODONE-acetaminophen(PERCOCET/ROXICET) 5-325 MG tablet. No other information was noted in the chart. Patient states she is on bed rest for three weeks due to surgery on her foot yesterday (10/27/2020). Patient had corn and bunion surgery. She can not walk on her foot. Started: Ibuprofen (ADVIL) 800 MG tablet and OxyCODONE-acetaminophen. Patient stated she has only had two Oxycodone and she is not going to take any more.  08/05/2020 - Podiatry no medication changes 06/17/2020 - Podiatry no medication changes 06/09/2020 - Ophthalmology  no medication changes 03/26/2020 - Ophthalmology no medication changes   Hospital visits:  None in previous 6 months   Objective:  Lab Results  Component Value Date   CREATININE 0.91 03/28/2020   BUN 21 03/28/2020   GFR 58.48 (L) 03/28/2020   GFRNONAA >60 01/27/2018   GFRAA >60 01/27/2018   NA 141 03/28/2020   K 4.5 03/28/2020   CALCIUM 9.4 03/28/2020   CO2 31 03/28/2020   GLUCOSE 124 (H) 03/28/2020    Lab Results  Component Value Date/Time   HGBA1C 6.3 (A) 09/29/2020 11:34 AM   HGBA1C 6.8 (H) 03/28/2020 09:05 AM   HGBA1C 6.7 (A) 12/19/2019 10:36 AM   HGBA1C 6.6 (H) 06/15/2019 08:46 AM   GFR 58.48 (L) 03/28/2020 09:05 AM   GFR 63.93 09/18/2019 09:35 AM   MICROALBUR 2.2 (H) 08/13/2015 09:23 AM   MICROALBUR <0.7 04/17/2015 09:56 AM    Last diabetic Eye exam:  Lab Results  Component Value Date/Time   HMDIABEYEEXA No Retinopathy 01/15/2017 12:00 AM    Last diabetic Foot exam:  11/04/20 podiatry visit    Lab Results  Component Value Date   CHOL 127 03/28/2020   HDL 50.10 03/28/2020   LDLCALC 63 03/28/2020   TRIG 70.0 03/28/2020   CHOLHDL 3 03/28/2020    Hepatic Function Latest Ref Rng & Units 03/28/2020 06/15/2019 02/02/2019  Total Protein 6.0 - 8.3 g/dL 6.9 6.4 6.9  Albumin 3.5 - 5.2 g/dL  4.2 4.2 4.3  AST 0 - 37 U/L 19 19 18   ALT 0 - 35 U/L 30 30 29   Alk Phosphatase 39 - 117 U/L 63 66 72  Total Bilirubin 0.2 - 1.2 mg/dL 0.7 0.7 0.6  Bilirubin, Direct 0.0 - 0.3 mg/dL - - -    Lab Results  Component Value Date/Time   TSH 0.88 03/28/2020 09:05 AM   TSH 1.40 06/15/2019 08:46 AM   FREET4 0.81 01/31/2018 08:34 AM   FREET4 1.02 01/26/2017 10:14 AM    CBC Latest Ref Rng & Units 03/28/2020 01/27/2018 11/26/2016  WBC 4.0 - 10.5 K/uL 5.3 7.7 5.0  Hemoglobin 12.0 - 15.0 g/dL 14.1 14.1 14.1  Hematocrit 36.0 - 46.0 % 42.9 42.9 42.8  Platelets 150.0 - 400.0 K/uL 205.0 220 208.0    Lab Results  Component Value Date/Time   VD25OH 32.43 09/25/2013 09:48 AM    Clinical ASCVD: No  The ASCVD Risk score (Arnett DK, et al., 2019) failed to calculate for the following reasons:   The 2019 ASCVD risk score is only valid for ages 61 to 80    Depression screen PHQ 2/9 03/31/2020 02/02/2019 01/31/2018  Decreased Interest 0 0 0  Down, Depressed, Hopeless 0 0 0  PHQ - 2 Score 0 0 0  Altered sleeping - 0 0  Tired, decreased energy - 0 0  Change in appetite - 0 0  Feeling bad or failure about yourself  - 0 0  Trouble concentrating - 0 0  Moving slowly or fidgety/restless - 0 0  Suicidal thoughts - 0 0  PHQ-9 Score - 0 0  Difficult doing work/chores - Not difficult at all Not difficult at all  Some recent data might be hidden    Social History   Tobacco Use  Smoking Status Never  Smokeless Tobacco Never   BP Readings from Last 3 Encounters:  02/12/21 132/81  11/04/20 132/69  09/29/20 (!) 150/80   Pulse Readings from Last 3 Encounters:  09/29/20 70  06/06/20 (!) 53  06/05/20 (!) 57   Wt Readings from Last 3 Encounters:  09/29/20 161 lb 9 oz (73.3 kg)  06/06/20 170 lb (77.1 kg)  06/05/20 166 lb 0.1 oz (75.3 kg)   BMI Readings from Last 3 Encounters:  09/29/20 29.55 kg/m  06/06/20 31.09 kg/m  06/05/20 30.36 kg/m    Assessment/Interventions: Review of patient past  medical history, allergies, medications, health status, including review of consultants reports, laboratory and other test data, was performed as part of comprehensive evaluation and provision of chronic care management services.   SDOH:  (Social Determinants of Health) assessments and interventions performed: Yes  SDOH Screenings   Alcohol Screen: Not on file  Depression (PHQ2-9): Low Risk    PHQ-2 Score: 0  Financial Resource Strain: Low Risk    Difficulty of Paying Living Expenses: Not very hard  Food Insecurity: Not on file  Housing: Not on file  Physical Activity: Not on file  Social Connections: Not on file  Stress: Not on file  Tobacco Use: Low Risk    Smoking Tobacco Use: Never  Smokeless Tobacco Use: Never   Passive Exposure: Not on file  Transportation Needs: Not on file    CCM Care Plan  Allergies  Allergen Reactions   Align [Acidophilus]     Worsened abd bloating, pressure   Augmentin [Amoxicillin-Pot Clavulanate]     Possible, had itchy rash   Cefprozil Swelling   Metformin And Related Other (See Comments)    Severe stomach pains, muscle aches   Timolol     Local reaction to eye drops    Medications Reviewed Today     Reviewed by Charlton Haws, [facility] (Pharmacist) on 03/05/21 at 1316  Med List Status: <None>   Medication Order Taking? Sig Documenting Provider Last Dose Status Informant  amLODipine (NORVASC) 5 MG tablet [id number] Yes Take 1 tablet (5 mg total) by mouth daily. Ria Bush, MD Taking Active   ascorbic acid (VITAMIN C) 500 MG tablet [id number] Yes Take 1 tablet (500 mg total) by mouth daily. Ria Bush, MD Taking Active   atenolol (TENORMIN) 50 MG tablet [id number] Yes TAKE 1 TABLET BY MOUTH  DAILY Ria Bush, MD Taking Active   atorvastatin (LIPITOR) 20 MG tablet [id number] Yes TAKE 1 TABLET BY MOUTH WEEKLY Ria Bush, MD Taking Active   Blood Glucose Monitoring Suppl (ONE TOUCH ULTRA SYSTEM KIT) W/DEVICE KIT  [id number] Yes 1 kit by Does not apply route once. Jackolyn Confer, MD Taking Active   Cholecalciferol (VITAMIN D3) 25 MCG (1000 UT) CAPS [id number] Yes Take 1 capsule (1,000 Units total) by mouth daily. Ria Bush, MD Taking Active   Coenzyme Q10 (COQ10) 100 MG CAPS [id number] Yes Take 1 capsule by mouth daily. [provider] Taking Active Self  cyanocobalamin (V-R VITAMIN B-12) 500 MCG tablet [id number] Yes Take 1 tablet (500 mcg total) by mouth daily. Ria Bush, MD Taking Active   glucose blood [facility] ULTRA) test strip [id number] Yes USE TO CHECK BLOOD SUGAR ONCE DAILY Ria Bush, MD Taking Active   Lancet Device [place] [id number] Yes One touch delica - Use as directed Ria Bush, MD Taking Active   Lancets (ONETOUCH DELICA PLUS [id number]) [place] [id number] Yes CHECK BLOOD SUGAR 1-2 TIMES DAILY AS NEEDED Ria Bush, MD Taking Active   Lancets Misc. ([place]) KIT [id number] Yes Use at home to test blood sugars daily. Ria Bush, MD Taking Active   levocetirizine (XYZAL) 5 MG tablet [id number] Yes TAKE 1 TABLET(5 MG) BY MOUTH EVERY Porfirio Oar, MD Taking Active   levothyroxine (SYNTHROID) 112 MCG tablet [id number] Yes TAKE 1 TABLET BY MOUTH  DAILY Ria Bush, MD Taking Active   losartan (COZAAR) 100 MG tablet [id number] Yes Take 1 tablet (100 mg total) by mouth daily. Ria Bush, MD Taking Active   montelukast (SINGULAIR) 10 MG tablet [id number] Yes TAKE 1 TABLET BY MOUTH  DAILY Ria Bush, MD Taking Active   omeprazole (PRILOSEC) 40 MG capsule [id number] Yes Take 1 capsule (40 mg total) by mouth daily.  Patient taking differently: Take 40 mg by mouth daily as needed.   Ria Bush, MD Taking Active Self            Patient Active Problem List   Diagnosis Date Noted   Vertigo 06/06/2020   Bilateral hearing loss 04/03/2020   Nasal sinus congestion 11/14/2019   Syncope and collapse 11/14/2019    Non-restorative sleep 11/14/2019   Rosacea 11/14/2019   Shoulder strain, right, initial encounter 08/17/2019   Varicose veins of right lower extremity with inflammation 02/15/2019   Lumbar  back pain with radiculopathy affecting right lower extremity 02/02/2018   Chronic cough 09/20/2017   Lesion of lip 05/19/2017   Right lower quadrant abdominal pain 04/21/2017   Lactose intolerance 04/21/2017   Fatty liver disease, nonalcoholic 59/16/3846   Abdominal discomfort, epigastric 11/26/2016   Low vitamin B12 level 06/24/2016   Thoracic radiculitis 06/04/2016   Advanced care planning/counseling discussion 12/25/2015   Arthralgia 04/17/2015   Abnormal CXR 01/17/2015   Health maintenance examination 01/02/2015   Snoring 01/02/2015   Leg cramps 12/27/2013   Skin growth 09/25/2013   Allergic rhinitis 65/99/3570   History of Helicobacter pylori infection 01/15/2013   Medicare annual wellness visit, subsequent 09/14/2012   Osteopenia 06/01/2012   Hypertension 11/29/2011   Glaucoma 11/29/2011   Type 2 diabetes, controlled, with peripheral neuropathy ([place]) 07/22/2011   Hypothyroidism 07/22/2011    Immunization History  Administered Date(s) Administered   Fluad Quad(high Dose 65+) 10/19/2018, 10/16/2019, 11/29/2019   Influenza Split 11/29/2011   Influenza, High Dose Seasonal PF 12/13/2014   Influenza,inj,Quad PF,6+ Mos 12/15/2012, 11/21/2013, 11/13/2015, 10/26/2016, 11/24/2017   PFIZER(Purple Top)SARS-COV-2 Vaccination 02/22/2019, 03/15/2019, 11/19/2019   Pneumococcal Conjugate-13 03/23/2013   Pneumococcal Polysaccharide-23 07/22/1998, 12/31/2011    Conditions to be addressed/monitored:  Hypertension, Hyperlipidemia, and Diabetes  Care Plan : [place]  Updates made by Charlton Haws, [place] since 03/05/2021 12:00 AM     Problem: Hypertension, Hyperlipidemia, and Diabetes   Onset Date: 11/04/2020     Long-Range Goal: Disease mgmt   Start Date: 02/05/2021   Expected End Date: 02/05/2022  This Visit's Progress: On track  Recent Progress: On track  Priority: High  Note:   Current Barriers:  Unable to independently monitor therapeutic efficacy Unable to maintain control of BP  Pharmacist Clinical Goal(s):  Patient will achieve adherence to monitoring guidelines and medication adherence to achieve therapeutic efficacy achieve improvement in BP as evidenced by home readings through collaboration with PharmD and provider.   Interventions: 1:1 collaboration with Ria Bush, MD regarding development and update of comprehensive plan of care as evidenced by provider attestation and co-signature Inter-disciplinary care team collaboration (see longitudinal plan of care) Comprehensive medication review performed; medication list updated in electronic medical record  Hypertension (BP goal <140/90) -Controlled  - home BP improved significantly since starting amlodipine Dec 2022; pt denies side effects -Current home readings:  12/20: 155/85 (arm); wrist (143/84);  12/21: 176/99 (arm1); 163/95 (arm2) 12/22: 181/98 (arm1); 169/94 (arm2); added amlodipine 5 mg daily 1/9 - 02/1921: 112/68, 117/89, 121/77, 128/82 -Current treatment: Losartan 100 mg daily AM- Appropriate, Effective, Safe, Accessible Atenolol 50 mg daily AM - Appropriate, Effective, Safe, Accessible Amlodipine 5 mg daily PM - Appropriate, Effective, Safe, Accessible -Medications previously tried: HCTZ  -Educated on BP goals and benefits of medications for prevention of heart attack, stroke and kidney damage;Symptoms of hypotension and importance of maintaining adequate hydration; -Counseled to monitor BP at home daily, document in log -Recommend to continue current medication  Hyperlipidemia: (LDL goal < 100) -Controlled - LDL 63 -Current treatment: Atorvastatin 20 mg once weekly - Appropriate, Effective, Safe, Accessible -Medications previously tried: Lipitor daily - hair loss   -Educated on Cholesterol goals;  -Recommended to continue current medication  Diabetes (A1c goal <7%) -Diet-Controlled, A1c 6.3% -Medications previously tried: none  -Current home glucose readings - every morning fasting glucose: 100-105 -Educated on A1c and blood sugar goals  Health Maintenance -Vaccine gaps: Flu, Covid booster, Shingrix, TDAP  Patient Goals/Self-Care Activities Patient will:  - take medications  as prescribed as evidenced by patient report and record review focus on medication adherence by routine check glucose daily, document, and provide at future appointments check blood pressure daily, document, and provide at future appointments    Medication Assistance: None required.  Patient affirms current coverage meets needs.  Compliance/Adherence/Medication fill history: Care Gaps: Eye exam - due (pt reports up to date, asked her to bring records to next appt)  Star-Rating Drugs: Medication:                Last Fill:         Day Supply Atorvastatin 20 mg      01/12/2021      63  PDC 100%                    Losartan 100 mg         12/18/2020      90 PDC 81% (inaccurate in chart) Fill dates verified with [facility] 02/02/21  Patient's preferred pharmacy is: [facility]  ([place], [place] [facility] [address]; [address] [place] [address] Phone: [phone] Fax: [phone]  Uses pill box? No - keeps them in visible location in pill bottles Pt endorses 100% compliance  We discussed: Benefits of medication synchronization, packaging and delivery as well as enhanced pharmacist oversight with [facility]. Patient decided to: Continue current medication management strategy  Care Plan and Follow Up Patient Decision:  Patient agrees to Care Plan and Follow-up.  Follow Up Plan: Telephone follow up appointment with care management team member scheduled for: 4 months  Charlene Brooke, PharmD,  BCACP Clinical Pharmacist [facility] [phone]

## 2021-03-05 NOTE — Patient Instructions (Signed)
Visit Information  Phone number for Pharmacist: [phone]   Goals Addressed   None     Care Plan : [place]  Updates made by Charlton Haws, RPH since 03/05/2021 12:00 AM     Problem: Hypertension, Hyperlipidemia, and Diabetes   Onset Date: 11/04/2020     Long-Range Goal: Disease mgmt   Start Date: 02/05/2021  Expected End Date: 02/05/2022  This Visit's Progress: On track  Recent Progress: On track  Priority: High  Note:   Current Barriers:  Unable to independently monitor therapeutic efficacy Unable to maintain control of BP  Pharmacist Clinical Goal(s):  Patient will achieve adherence to monitoring guidelines and medication adherence to achieve therapeutic efficacy achieve improvement in BP as evidenced by home readings through collaboration with PharmD and provider.   Interventions: 1:1 collaboration with Ria Bush, MD regarding development and update of comprehensive plan of care as evidenced by provider attestation and co-signature Inter-disciplinary care team collaboration (see longitudinal plan of care) Comprehensive medication review performed; medication list updated in electronic medical record  Hypertension (BP goal <140/90) -Controlled  - home BP improved significantly since starting amlodipine Dec 2022; pt denies side effects -Current home readings:  12/20: 155/85 (arm); wrist (143/84);  12/21: 176/99 (arm1); 163/95 (arm2) 12/22: 181/98 (arm1); 169/94 (arm2); added amlodipine 5 mg daily 1/9 - 02/1921: 112/68, 117/89, 121/77, 128/82 -Current treatment: Losartan 100 mg daily AM- Appropriate, Effective, Safe, Accessible Atenolol 50 mg daily AM - Appropriate, Effective, Safe, Accessible Amlodipine 5 mg daily PM - Appropriate, Effective, Safe, Accessible -Medications previously tried: HCTZ  -Educated on BP goals and benefits of medications for prevention of heart attack, stroke and kidney damage;Symptoms of hypotension and  importance of maintaining adequate hydration; -Counseled to monitor BP at home daily, document in log -Recommend to continue current medication  Hyperlipidemia: (LDL goal < 100) -Controlled - LDL 63 -Current treatment: Atorvastatin 20 mg once weekly - Appropriate, Effective, Safe, Accessible -Medications previously tried: Lipitor daily - hair loss  -Educated on Cholesterol goals;  -Recommended to continue current medication  Diabetes (A1c goal <7%) -Diet-Controlled, A1c 6.3% -Medications previously tried: none  -Current home glucose readings - every morning fasting glucose: 100-105 -Educated on A1c and blood sugar goals  Health Maintenance -Vaccine gaps: Flu, Covid booster, Shingrix, TDAP  Patient Goals/Self-Care Activities Patient will:  - take medications as prescribed as evidenced by patient report and record review focus on medication adherence by routine check glucose daily, document, and provide at future appointments check blood pressure daily, document, and provide at future appointments      Patient verbalizes understanding of instructions and care plan provided today and agrees to view in [place]. Active MyChart status confirmed with patient.   Telephone follow up appointment with pharmacy team member scheduled for: 4 months  Charlene Brooke, PharmD, [facility] Clinical Pharmacist [place] [facility] [phone]

## 2021-03-15 ENCOUNTER — Other Ambulatory Visit: Payer: Self-pay | Admitting: Family Medicine

## 2021-03-16 NOTE — Telephone Encounter (Signed)
Refill request Atenolol Last refill 04/09/20 #90/3 Last office visit 09/29/20 See allergy/contraindication

## 2021-03-17 DIAGNOSIS — I10 Essential (primary) hypertension: Secondary | ICD-10-CM

## 2021-03-17 DIAGNOSIS — E1142 Type 2 diabetes mellitus with diabetic polyneuropathy: Secondary | ICD-10-CM

## 2021-03-18 NOTE — Telephone Encounter (Signed)
ERx 

## 2021-03-31 ENCOUNTER — Other Ambulatory Visit: Payer: Medicare Other

## 2021-04-02 ENCOUNTER — Other Ambulatory Visit: Payer: Self-pay

## 2021-04-02 ENCOUNTER — Ambulatory Visit (INDEPENDENT_AMBULATORY_CARE_PROVIDER_SITE_OTHER): Payer: Medicare Other | Admitting: Obstetrics

## 2021-04-02 ENCOUNTER — Encounter: Payer: Self-pay | Admitting: Obstetrics

## 2021-04-02 VITALS — BP 120/70 | Ht 62.0 in | Wt 162.0 lb

## 2021-04-02 DIAGNOSIS — Z01419 Encounter for gynecological examination (general) (routine) without abnormal findings: Secondary | ICD-10-CM

## 2021-04-02 NOTE — Progress Notes (Signed)
Gynecology Annual Exam  PCP: Ria Bush, MD  Chief Complaint: No chief complaint on file.   History of Present Illness:Patient is a 87 y.o. No obstetric history on file. presents for annual exam. The patient has no complaints today.   LMP: No LMP recorded. Patient is postmenopausal.  The patient is not currently sexually active. She denies dyspareunia.  The patient does not perform self breast exams.  There is some possible family history of breast or ovarian cancer in her family. Fynlee has an aunt who had cancer, that she thinks was ovarian, however on furthr questioning, this aunt died in her 63s. The pateint is uncertain whether the cancer was ovarian.  The patient wears seatbelts: yes.   The patient has regular exercise:  she uses a stionary bike at home .    The patient denies current symptoms of depression.     Review of Systems: ROS  Past Medical History:  Patient Active Problem List   Diagnosis Date Noted   Vertigo 06/06/2020   Bilateral hearing loss 04/03/2020   Nasal sinus congestion 11/14/2019   Syncope and collapse 11/14/2019    Carotid US 11/2019 WNL Echocardiogram 11/2019 - EF 60-65%, mod LVH, G1DD, mild MVR, increased RA pressure (60mHg)    Non-restorative sleep 11/14/2019   Rosacea 11/14/2019    Recent dx, seeing GBabs Bertin(Dr BAubery Lapping treating with Soolantra 1% cream    Shoulder strain, right, initial encounter 08/17/2019   Varicose veins of right lower extremity with inflammation 02/15/2019   Lumbar back pain with radiculopathy affecting right lower extremity 02/02/2018    Progressive multilevel DDD by lumbar films 08/2018    Chronic cough 09/20/2017   Lesion of lip 05/19/2017   Right lower quadrant abdominal pain 04/21/2017   Lactose intolerance 04/21/2017   Fatty liver disease, nonalcoholic 110/17/5102   Moderate hepatic steatosis by MR 2015    Abdominal discomfort, epigastric 11/26/2016   Low vitamin B12 level 06/24/2016    Thoracic radiculitis 06/04/2016   Advanced care planning/counseling discussion 12/25/2015   Arthralgia 04/17/2015   Abnormal CXR 01/17/2015   Health maintenance examination 01/02/2015   Snoring 01/02/2015   Leg cramps 12/27/2013   Skin growth 09/25/2013   Allergic rhinitis 058/52/7782  History of Helicobacter pylori infection 01/15/2013    Treated 2014 H pylori stool Ag negative 02/2017    Medicare annual wellness visit, subsequent 09/14/2012   Osteopenia 06/01/2012    DEXA 06/2020 - T -1.6 at L femur neck (osteopenia), not high risk for fracture.    Hypertension 11/29/2011   Glaucoma 11/29/2011    Followed by [facility] Dr RDarrin Luis    Type 2 diabetes, controlled, with peripheral neuropathy ([place] 07/22/2011    H/o hypoglycemia with oral antihyperglycemics    Hypothyroidism 07/22/2011    Past Surgical History:  Past Surgical History:  Procedure Laterality Date   APPENDECTOMY  1998   BUNIONECTOMY     CHOLECYSTECTOMY     COLONOSCOPY  2007   int hem, poor bowel prep, normal barium enema in following (Competiello)   COLONOSCOPY WITH PROPOFOL N/A 07/22/2016   diverticulosis (Allen Norris Darren, MD)   EYE SURGERY     R  eye-lid drop surgery   TONSILLECTOMY      Gynecologic History:  No LMP recorded. Patient is postmenopausal. Last Pap:2021 Results were: NILM no abnormalities  Last mammogram: 2022 Results were: BI-RAD I  Obstetric History: No obstetric history on file.  Family History:  Family History  Problem Relation Age of Onset   Heart disease Mother    Asthma Mother    Heart attack Mother 11       aneurysm ruptured by heart   Heart disease Father    Deep vein thrombosis Father    Leukemia Brother    Cancer Brother    Diabetes Sister    Crohn's disease Brother    Diabetes Brother    Arthritis Other    Diabetes Other    Cancer Maternal Aunt        ovarian?   Cancer Maternal Grandfather        colon   Breast cancer Neg Hx     Social History:  Social  History   Socioeconomic History   Marital status: Married    Spouse name: Not on file   Number of children: Not on file   Years of education: Not on file   Highest education level: Not on file  Occupational History   Not on file  Tobacco Use   Smoking status: Never   Smokeless tobacco: Never  Vaping Use   Vaping Use: Never used  Substance and Sexual Activity   Alcohol use: Not Currently    Alcohol/week: 0.0 standard drinks    Comment: occasional   Drug use: No   Sexual activity: Yes  Other Topics Concern   Not on file  Social History Narrative   Lives in [place], from [place]. Daughter lives here. 4 daughters.   From [place]   Work - retired Data processing manager   Diet - regular   Exercise - bike daily at home   Social Determinants of Radio broadcast assistant Strain: Low Risk    Difficulty of Paying Living Expenses: Not very hard  Food Insecurity: Not on file  Transportation Needs: Not on file  Physical Activity: Not on file  Stress: Not on file  Social Connections: Not on file  Intimate Partner Violence: Not on file    Allergies:  Allergies  Allergen Reactions   Align [Acidophilus]     Worsened abd bloating, pressure   Augmentin [Amoxicillin-Pot Clavulanate]     Possible, had itchy rash   Cefprozil Swelling   Metformin And Related Other (See Comments)    Severe stomach pains, muscle aches   Timolol     Local reaction to eye drops    Medications: Prior to Admission medications   Medication Sig Start Date End Date Taking? Authorizing Provider  amLODipine (NORVASC) 5 MG tablet Take 1 tablet (5 mg total) by mouth daily. 02/05/21   Ria Bush, MD  ascorbic acid (VITAMIN C) 500 MG tablet Take 1 tablet (500 mg total) by mouth daily. 02/14/19   Ria Bush, MD  atenolol (TENORMIN) 50 MG tablet TAKE 1 TABLET BY MOUTH  DAILY 03/18/21   Ria Bush, MD  atorvastatin (LIPITOR) 20 MG tablet TAKE 1 TABLET BY MOUTH WEEKLY 02/11/21   Ria Bush, MD  Blood  Glucose Monitoring Suppl (ONE TOUCH ULTRA SYSTEM KIT) W/DEVICE KIT 1 kit by Does not apply route once. 12/27/13   Jackolyn Confer, MD  Cholecalciferol (VITAMIN D3) 25 MCG (1000 UT) CAPS Take 1 capsule (1,000 Units total) by mouth daily. 02/14/19   Ria Bush, MD  Coenzyme Q10 (COQ10) 100 MG CAPS Take 1 capsule by mouth daily.    [provider]  cyanocobalamin (V-R VITAMIN B-12) 500 MCG tablet Take 1 tablet (500 mcg total) by mouth daily. 06/24/16   Ria Bush, MD  glucose blood [facility]  ULTRA) test strip USE TO CHECK BLOOD SUGAR ONCE DAILY 09/19/20   Ria Bush, MD  Lancet Device MISC One touch delica - Use as directed 06/24/16   Ria Bush, MD  Lancets [facility] DELICA PLUS [id number]) [place] 1-2 TIMES DAILY AS NEEDED 12/24/19   Ria Bush, MD  Lancets Misc. (ACCU-CHEK SOFTCLIX LANCET DEV) KIT Use at home to test blood sugars daily. 06/24/16   Ria Bush, MD  levocetirizine (XYZAL) 5 MG tablet TAKE 1 TABLET(5 MG) BY MOUTH EVERY EVENING 09/22/20   Chesley Mires, MD  levothyroxine (SYNTHROID) 112 MCG tablet TAKE 1 TABLET BY MOUTH  DAILY 02/24/21   Ria Bush, MD  losartan (COZAAR) 100 MG tablet Take 1 tablet (100 mg total) by mouth daily. 09/29/20   Ria Bush, MD  montelukast (SINGULAIR) 10 MG tablet TAKE 1 TABLET BY MOUTH  DAILY 03/16/21   Ria Bush, MD  omeprazole (PRILOSEC) 40 MG capsule Take 1 capsule (40 mg total) by mouth daily. Patient taking differently: Take 40 mg by mouth daily as needed. 12/19/19   Ria Bush, MD    Physical Exam Vitals: Blood pressure 120/70, height 5' 2"  (1.575 m), weight 162 lb (73.5 kg).  General: NAD HEENT: normocephalic, anicteric Thyroid: no enlargement, no palpable nodules Pulmonary: No increased work of breathing, CTAB Cardiovascular: RRR, distal pulses 2+ Breast: Breast symmetrical, no tenderness, no palpable nodules or masses, no skin or nipple retraction present, no  nipple discharge.  No axillary or supraclavicular lymphadenopathy. Abdomen: NABS, soft, non-tender, non-distended.  Umbilicus without lesions.  No hepatomegaly, splenomegaly or masses palpable. No evidence of hernia  Genitourinary:  External: Normal external female genitalia.  Normal urethral meatus, normal Bartholin's and Skene's glands.    Vagina: Normal vaginal mucosa, no evidence of prolapse.    Cervix: Grossly normal in appearance, no bleeding  Uterus: Non-enlarged, mobile, normal contour.  No CMT  Adnexa: ovaries non-enlarged, no adnexal masses  Rectal: deferred  Lymphatic: no evidence of inguinal lymphadenopathy Extremities: no edema, erythema, or tenderness Neurologic: Grossly intact Psychiatric: mood appropriate, affect full  Female chaperone present for pelvic and breast  portions of the physical exam     Assessment: 87 y.o. No obstetric history on file. routine annual exam  Plan: Problem List Items Addressed This Visit   None   1) Mammogram - recommend yearly screening mammogram.  Mammogram Is up to date  2) STI screening  was notoffered and therefore not obtained  3) ASCCP guidelines and rational discussed.  Patient opts for discontinue age >46 screening interval  4) Osteoporosis  - per USPTF routine screening DEXA at age 33 -  Consider FDA-approved medical therapies in postmenopausal women and men aged 23 years and older, based on the following: a) A hip or vertebral (clinical or morphometric) fracture b) T-score ? -2.5 at the femoral neck or spine after appropriate evaluation to exclude secondary causes C) Low bone mass (T-score between -1.0 and -2.5 at the femoral neck or spine) and a 10-year probability of a hip fracture ? 3% or a 10-year probability of a major osteoporosis-related fracture ? 20% based on the [place]-adapted WHO algorithm   5) Routine healthcare maintenance including cholesterol, diabetes screening discussed managed by PCP  6) Colonoscopy up to  date.  Screening recommended starting at age 30 for average risk individuals, age 67 for individuals deemed at increased risk (including African Americans) and recommended to continue until age 9.  For patient age 106-85 individualized approach is recommended.  Gold standard screening  is via colonoscopy, Cologuard screening is an acceptable alternative for patient unwilling or unable to undergo colonoscopy.  "Colorectal cancer screening for average?risk adults: 2018 guideline update from the American Cancer Society"CA: A Cancer Journal for Clinicians: Jul 14, 2016   7) No follow-ups on file.    Imagene Riches, CNM  04/02/2021 2:21 PM   Mosetta Pigeon, [facility] 04/02/2021, 2:21 PM

## 2021-04-07 ENCOUNTER — Encounter: Payer: Medicare Other | Admitting: Family Medicine

## 2021-04-14 ENCOUNTER — Ambulatory Visit
Admission: RE | Admit: 2021-04-14 | Discharge: 2021-04-14 | Disposition: A | Discharge status: Home / Self Care | Payer: Medicare Other | Source: Ambulatory Visit | Attending: Family | Admitting: Family

## 2021-04-14 ENCOUNTER — Other Ambulatory Visit: Payer: Self-pay

## 2021-04-14 ENCOUNTER — Ambulatory Visit (INDEPENDENT_AMBULATORY_CARE_PROVIDER_SITE_OTHER): Payer: Medicare Other | Admitting: Family

## 2021-04-14 VITALS — BP 140/82 | HR 64 | Ht 64.0 in | Wt 164.0 lb

## 2021-04-14 DIAGNOSIS — R1011 Right upper quadrant pain: Secondary | ICD-10-CM | POA: Diagnosis not present

## 2021-04-14 DIAGNOSIS — R300 Dysuria: Secondary | ICD-10-CM | POA: Insufficient documentation

## 2021-04-14 DIAGNOSIS — R350 Frequency of micturition: Secondary | ICD-10-CM | POA: Diagnosis not present

## 2021-04-14 DIAGNOSIS — R109 Unspecified abdominal pain: Secondary | ICD-10-CM | POA: Diagnosis not present

## 2021-04-14 DIAGNOSIS — R1031 Right lower quadrant pain: Secondary | ICD-10-CM | POA: Diagnosis not present

## 2021-04-14 DIAGNOSIS — I700 Atherosclerosis of aorta: Secondary | ICD-10-CM | POA: Diagnosis not present

## 2021-04-14 DIAGNOSIS — R10A1 Flank pain, right side: Secondary | ICD-10-CM | POA: Insufficient documentation

## 2021-04-14 LAB — POC URINALSYSI DIPSTICK (AUTOMATED)
Bilirubin, UA: NEGATIVE
Blood, UA: NEGATIVE
Glucose, UA: POSITIVE — AB
Nitrite, UA: NEGATIVE
Protein, UA: POSITIVE — AB
Spec Grav, UA: 1.015 (ref 1.010–1.025)
Urobilinogen, UA: 0.2 E.U./dL
pH, UA: 6 (ref 5.0–8.0)

## 2021-04-14 LAB — CBC WITH DIFFERENTIAL/PLATELET
Basophils Absolute: 0 10*3/uL (ref 0.0–0.1)
Basophils Relative: 0.4 % (ref 0.0–3.0)
Eosinophils Absolute: 0.1 10*3/uL (ref 0.0–0.7)
Eosinophils Relative: 2.4 % (ref 0.0–5.0)
HCT: 40.9 % (ref 36.0–46.0)
Hemoglobin: 13.5 g/dL (ref 12.0–15.0)
Lymphocytes Relative: 31.5 % (ref 12.0–46.0)
Lymphs Abs: 1.8 10*3/uL (ref 0.7–4.0)
MCHC: 32.9 g/dL (ref 30.0–36.0)
MCV: 89.8 fl (ref 78.0–100.0)
Monocytes Absolute: 0.5 10*3/uL (ref 0.1–1.0)
Monocytes Relative: 8.6 % (ref 3.0–12.0)
Neutro Abs: 3.3 10*3/uL (ref 1.4–7.7)
Neutrophils Relative %: 57.1 % (ref 43.0–77.0)
Platelets: 199 10*3/uL (ref 150.0–400.0)
RBC: 4.56 Mil/uL (ref 3.87–5.11)
RDW: 13.9 % (ref 11.5–15.5)
WBC: 5.7 10*3/uL (ref 4.0–10.5)

## 2021-04-14 LAB — COMPREHENSIVE METABOLIC PANEL
ALT: 26 U/L (ref 0–35)
AST: 21 U/L (ref 0–37)
Albumin: 4.3 g/dL (ref 3.5–5.2)
Alkaline Phosphatase: 67 U/L (ref 39–117)
BUN: 19 mg/dL (ref 6–23)
CO2: 30 mEq/L (ref 19–32)
Calcium: 9.5 mg/dL (ref 8.4–10.5)
Chloride: 103 mEq/L (ref 96–112)
Creatinine, Ser: 0.99 mg/dL (ref 0.40–1.20)
GFR: 52.47 mL/min — ABNORMAL LOW (ref >60.00)
Glucose, Bld: 131 mg/dL — ABNORMAL HIGH (ref 70–99)
Potassium: 4.4 mEq/L (ref 3.5–5.1)
Sodium: 139 mEq/L (ref 135–145)
Total Bilirubin: 0.6 mg/dL (ref 0.2–1.2)
Total Protein: 7.1 g/dL (ref 6.0–8.3)

## 2021-04-14 LAB — AMYLASE: Amylase: 58 U/L (ref 27–131)

## 2021-04-14 LAB — LIPASE: Lipase: 31 U/L (ref 11.0–59.0)

## 2021-04-14 NOTE — Progress Notes (Signed)
please let pt know that CT abdomen pelvis came back without acute concerns. Urine did have some possible contaminates in it, pending urine culture to see if with urinary tract infection. For the back pain/tenderness I suggest pt get over the counter lidocaine patches to help with pain, heat also . Have pt schedule follow up with Dr. Darnell Level in the next two weeks. Pending results of culture.   Vicente Males, is it to late to maybe send a urine micro?

## 2021-04-14 NOTE — Assessment & Plan Note (Addendum)
poct urine and urine culture today R/o dvt  Extensive workup to exceed 45 minutes in office

## 2021-04-14 NOTE — Progress Notes (Signed)
Established Patient Office Visit  Subjective:  Patient ID: [name], female    DOB: 1934-05-28  Age: 87 y.o. MRN: [id number]  CC:  Chief Complaint  Patient presents with   Back Pain    Pt stated-right side of the back having sensitive to touch, bruce, and burning sensation--2 weeks. Pt mention hitting sode of treament room    HPI [name] is here today with concerns.   Right lower abd /pelvic pain that radiates to the right posterior back on mid spine. Also with c/o right hip pain, sometimes trouble with walking. Sensitive to the touch in the right backside for the last nine days, one day after leaving her GYN.   No constipation, no diarrhea. Maybe increased urinary frequency, no urgency, slight dysuria No flank no fever/pain.   Recently saw GYN and workup was negative per pt. Seen 04/02/21 by Dr. Rolly Salter, pelvic physical exam   Past Medical History:  Diagnosis Date   Allergy    hay fever   Arthritis    Cataract    resolved with surgery   Chicken pox    Colon polyp    Diabetes mellitus 2008   Generalized headaches    thinks caused by glaucoma   Glaucoma    Hypertension    Right sided sciatica 07/03/2013   Thyroid disease    hypothyroidism   Urinary incontinence    UTI (urinary tract infection)     Past Surgical History:  Procedure Laterality Date   APPENDECTOMY  1998   BUNIONECTOMY     CHOLECYSTECTOMY     COLONOSCOPY  2007   int hem, poor bowel prep, normal barium enema in following (Competiello)   COLONOSCOPY WITH PROPOFOL N/A 07/22/2016   diverticulosis Allen Norris, Darren, MD)   EYE SURGERY     R  eye-lid drop surgery   TONSILLECTOMY      Family History  Problem Relation Age of Onset   Heart disease Mother    Asthma Mother    Heart attack Mother 75       aneurysm ruptured by heart   Heart disease Father    Deep vein thrombosis Father    Leukemia Brother    Cancer Brother    Diabetes Sister    Crohn's disease Brother    Diabetes Brother     Arthritis Other    Diabetes Other    Cancer Maternal Aunt        ovarian?   Cancer Maternal Grandfather        colon   Breast cancer Neg Hx     Social History   Socioeconomic History   Marital status: Married    Spouse name: Not on file   Number of children: Not on file   Years of education: Not on file   Highest education level: Not on file  Occupational History   Not on file  Tobacco Use   Smoking status: Never   Smokeless tobacco: Never  Vaping Use   Vaping Use: Never used  Substance and Sexual Activity   Alcohol use: Not Currently    Alcohol/week: 0.0 standard drinks    Comment: occasional   Drug use: No   Sexual activity: Yes  Other Topics Concern   Not on file  Social History Narrative   Lives in [place], from [place]. Daughter lives here. 4 daughters.   From [place]   Work - retired Data processing manager   Diet - regular   Exercise - bike daily at home  Social Determinants of Health   Financial Resource Strain: Low Risk    Difficulty of Paying Living Expenses: Not very hard  Food Insecurity: Not on file  Transportation Needs: Not on file  Physical Activity: Not on file  Stress: Not on file  Social Connections: Not on file  Intimate Partner Violence: Not on file    Outpatient Medications Prior to Visit  Medication Sig Dispense Refill   amLODipine (NORVASC) 5 MG tablet Take 1 tablet (5 mg total) by mouth daily. 30 tablet 6   ascorbic acid (VITAMIN C) 500 MG tablet Take 1 tablet (500 mg total) by mouth daily.     atenolol (TENORMIN) 50 MG tablet TAKE 1 TABLET BY MOUTH  DAILY 90 tablet 0   atorvastatin (LIPITOR) 20 MG tablet TAKE 1 TABLET BY MOUTH WEEKLY 12 tablet 0   Blood Glucose Monitoring Suppl (ONE TOUCH ULTRA SYSTEM KIT) W/DEVICE KIT 1 kit by Does not apply route once. 1 each 0   Cholecalciferol (VITAMIN D3) 25 MCG (1000 UT) CAPS Take 1 capsule (1,000 Units total) by mouth daily. 30 capsule    Coenzyme Q10 (COQ10) 100 MG CAPS Take 1 capsule by mouth daily.      cyanocobalamin (V-R VITAMIN B-12) 500 MCG tablet Take 1 tablet (500 mcg total) by mouth daily.     glucose blood (ONETOUCH ULTRA) test strip USE TO CHECK BLOOD SUGAR ONCE DAILY 100 strip 3   Lancet Device MISC One touch delica - Use as directed 1 each 1   Lancets (ONETOUCH DELICA PLUS [id number]) MISC CHECK BLOOD SUGAR 1-2 TIMES DAILY AS NEEDED 100 each 3   Lancets Misc. (ACCU-CHEK SOFTCLIX LANCET DEV) KIT Use at home to test blood sugars daily. 1 kit 0   levocetirizine (XYZAL) 5 MG tablet TAKE 1 TABLET(5 MG) BY MOUTH EVERY EVENING 30 tablet 5   levothyroxine (SYNTHROID) 112 MCG tablet TAKE 1 TABLET BY MOUTH  DAILY 90 tablet 0   losartan (COZAAR) 100 MG tablet Take 1 tablet (100 mg total) by mouth daily. 90 tablet 3   montelukast (SINGULAIR) 10 MG tablet TAKE 1 TABLET BY MOUTH  DAILY 90 tablet 0   omeprazole (PRILOSEC) 40 MG capsule Take 1 capsule (40 mg total) by mouth daily. (Patient taking differently: Take 40 mg by mouth daily as needed.) 90 capsule 3   No facility-administered medications prior to visit.    Allergies  Allergen Reactions   Align [Acidophilus]     Worsened abd bloating, pressure   Augmentin [Amoxicillin-Pot Clavulanate]     Possible, had itchy rash   Cefprozil Swelling   Metformin And Related Other (See Comments)    Severe stomach pains, muscle aches   Timolol     Local reaction to eye drops    ROS Review of Systems  Constitutional:  Negative for chills and fever.  HENT:  Negative for congestion, ear pain, sinus pressure and sore throat.   Respiratory:  Negative for cough, shortness of breath and wheezing.   Cardiovascular:  Negative for chest pain and palpitations.  Gastrointestinal:  Positive for abdominal pain. Negative for anal bleeding, blood in stool, constipation, diarrhea and nausea.  Genitourinary:  Positive for dysuria and frequency. Negative for difficulty urinating, hematuria, menstrual problem, pelvic pain, urgency, vaginal bleeding, vaginal discharge  and vaginal pain.     Objective:    Physical Exam Vitals reviewed.  Constitutional:      General: She is not in acute distress.    Appearance: She is obese. She is  not ill-appearing, toxic-appearing or diaphoretic.  Cardiovascular:     Rate and Rhythm: Normal rate and regular rhythm.  Pulmonary:     Effort: Pulmonary effort is normal.  Abdominal:     General: Abdomen is flat. Bowel sounds are normal. There is no distension.     Palpations: Abdomen is soft. There is no mass.     Tenderness: There is abdominal tenderness in the right upper quadrant and right lower quadrant. There is rebound (right mid to lower quadrant). There is no guarding.  Musculoskeletal:        General: Swelling (right mid lower back) and tenderness (right mid lower back) present.     Thoracic back: Tenderness present.       Back:  Neurological:     Mental Status: She is alert.    BP 140/82    Pulse 64    Ht _0  (1.626 m)    Wt 164 lb (74.4 kg)    SpO2 98%    BMI 28.15 kg/m  Wt Readings from Last 3 Encounters:  04/14/21 164 lb (74.4 kg)  04/02/21 162 lb (73.5 kg)  09/29/20 161 lb 9 oz (73.3 kg)     Health Maintenance Due  Topic Date Due   TETANUS/TDAP  Never done   Zoster Vaccines- Shingrix (1 of 2) Never done   COVID-19 Vaccine (4 - Booster for Pfizer series) 01/14/2020   OPHTHALMOLOGY EXAM  01/26/2020   INFLUENZA VACCINE  09/15/2020   HEMOGLOBIN A1C  04/01/2021    There are no preventive care reminders to display for this patient.  Lab Results  Component Value Date   TSH 0.88 03/28/2020   Lab Results  Component Value Date   WBC 5.3 03/28/2020   HGB 14.1 03/28/2020   HCT 42.9 03/28/2020   MCV 91.3 03/28/2020   PLT 205.0 03/28/2020   Lab Results  Component Value Date   NA 141 03/28/2020   K 4.5 03/28/2020   CO2 31 03/28/2020   GLUCOSE 124 (H) 03/28/2020   BUN 21 03/28/2020   CREATININE 0.91 03/28/2020   BILITOT 0.7 03/28/2020   ALKPHOS 63 03/28/2020   AST 19 03/28/2020    ALT 30 03/28/2020   PROT 6.9 03/28/2020   ALBUMIN 4.2 03/28/2020   CALCIUM 9.4 03/28/2020   ANIONGAP 7 01/27/2018   GFR 58.48 (L) 03/28/2020   Lab Results  Component Value Date   HGBA1C 6.3 (A) 09/29/2020      Assessment & Plan:   Problem List Items Addressed This Visit       Other   Abdominal pain, right lower quadrant    Stat ct abd pelvis R/o appendicitis/mass/hepatomegaly Stat cmp Cbc amylase lipase workup as well  If any worsening severe pain go to er       Relevant Orders   CT Abdomen Pelvis Wo Contrast   Comprehensive metabolic panel   CBC with Differential   Amylase   Lipase   Abdominal pain, right upper quadrant   Relevant Orders   CT Abdomen Pelvis Wo Contrast   Comprehensive metabolic panel   CBC with Differential   Amylase   Lipase   Acute right flank pain    R/o kidney stone or kidney concern, stat CT abd pelvis  Urine today wth poct and culture Stat cmp for possible contrast      Relevant Orders   CT Abdomen Pelvis Wo Contrast   Comprehensive metabolic panel   Urinary frequency   Relevant Orders   Comprehensive metabolic  panel   Dysuria - Primary    poct urine and urine culture today R/o dvt       Relevant Orders   Urine Culture   POCT Urinalysis Dipstick (Automated)    No orders of the defined types were placed in this encounter.   Follow-up: No follow-ups on file.    Eugenia Pancoast, FNP

## 2021-04-14 NOTE — Progress Notes (Signed)
Pending urine culture and lab work

## 2021-04-14 NOTE — Assessment & Plan Note (Signed)
Stat ct abd pelvis R/o appendicitis/mass/hepatomegaly Stat cmp Cbc amylase lipase workup as well  If any worsening severe pain go to er

## 2021-04-14 NOTE — Assessment & Plan Note (Signed)
R/o kidney stone or kidney concern, stat CT abd pelvis  Urine today wth poct and culture Stat cmp for possible contrast

## 2021-04-15 LAB — URINE CULTURE
MICRO NUMBER:: 13067753
Result:: NO GROWTH
SPECIMEN QUALITY:: ADEQUATE

## 2021-04-16 NOTE — Progress Notes (Signed)
Urine culture came back negative If patient still having pain? Lets have her follow-up in 2 weeks with her primary care doctor to see her as well as repeat her urine that had glucose In it.  Has patient seen any rash?

## 2021-04-17 ENCOUNTER — Other Ambulatory Visit: Payer: Self-pay | Admitting: Family Medicine

## 2021-04-17 ENCOUNTER — Telehealth: Payer: Self-pay

## 2021-04-17 NOTE — Progress Notes (Signed)
? ? ?  Chronic Care Management ?Pharmacy Assistant  ? ?Name: [name]  MRN: [id number] DOB: 11-23-1934 ? ? ?Transition CCM to Self Care ? ?Attempted contact with patient 3 times on 03/07, 03/09 and 03/10. Unsuccessful outreach.  ? ?Patient contacted to inform they have achieved their CCM goals and no longer need to be contacted as frequently. Patient advised services will still be available to them if they would like to reach out or have any new health concerns. Verified patient had contact information to pharmacist and health concierge on hand. Patient made aware CCM services would be continued if desired. Patient consented to cancel future CCM appointments. ? ?Charlene Brooke, CPP notified ? ?Marijean Niemann, RMA ?Clinical Pharmacy Assistant ?[phone] ? ? ? ? ? ? ? ?

## 2021-04-20 DIAGNOSIS — H401133 Primary open-angle glaucoma, bilateral, severe stage: Secondary | ICD-10-CM | POA: Diagnosis not present

## 2021-04-21 ENCOUNTER — Encounter: Payer: Self-pay | Admitting: Family Medicine

## 2021-04-22 ENCOUNTER — Telehealth: Payer: Self-pay

## 2021-04-22 DIAGNOSIS — E1142 Type 2 diabetes mellitus with diabetic polyneuropathy: Secondary | ICD-10-CM

## 2021-04-22 MED ORDER — ONETOUCH DELICA PLUS LANCET33G MISC: 3 refills | Status: DC

## 2021-04-22 MED ORDER — ONETOUCH ULTRA VI STRP: ORAL_STRIP | 3 refills | Status: DC

## 2021-04-22 NOTE — Addendum Note (Signed)
Addended by: Brenton Grills on: 05/21/6254 38:93 PM ? ? Modules accepted: Orders ? ?

## 2021-04-22 NOTE — Telephone Encounter (Addendum)
E-scribed refills.  

## 2021-04-27 ENCOUNTER — Ambulatory Visit (INDEPENDENT_AMBULATORY_CARE_PROVIDER_SITE_OTHER): Payer: Medicare Other | Admitting: Family

## 2021-04-27 ENCOUNTER — Other Ambulatory Visit: Payer: Self-pay

## 2021-04-27 ENCOUNTER — Encounter: Payer: Self-pay | Admitting: Family

## 2021-04-27 VITALS — BP 128/72 | HR 82 | Ht 64.0 in | Wt 162.0 lb

## 2021-04-27 DIAGNOSIS — S239XXA Sprain of unspecified parts of thorax, initial encounter: Secondary | ICD-10-CM | POA: Insufficient documentation

## 2021-04-27 DIAGNOSIS — E1142 Type 2 diabetes mellitus with diabetic polyneuropathy: Secondary | ICD-10-CM

## 2021-04-27 DIAGNOSIS — R81 Glycosuria: Secondary | ICD-10-CM | POA: Insufficient documentation

## 2021-04-27 LAB — URINALYSIS, ROUTINE W REFLEX MICROSCOPIC
Bilirubin Urine: NEGATIVE
Hgb urine dipstick: NEGATIVE
Ketones, ur: NEGATIVE
Nitrite: NEGATIVE
RBC / HPF: NONE SEEN (ref 0–?)
Specific Gravity, Urine: <=1.005 — AB (ref 1.000–1.030)
Total Protein, Urine: NEGATIVE
Urine Glucose: NEGATIVE
Urobilinogen, UA: 0.2 (ref 0.0–1.0)
pH: 7 (ref 5.0–8.0)

## 2021-04-27 LAB — POCT GLYCOSYLATED HEMOGLOBIN (HGB A1C): Hemoglobin A1C: 6.7 % — AB (ref 4.0–5.6)

## 2021-04-27 MED ORDER — LANCET DEVICE MISC: 1 refills | Status: DC

## 2021-04-27 MED ORDER — ONETOUCH ULTRA VI STRP: ORAL_STRIP | 3 refills | Status: DC

## 2021-04-27 NOTE — Progress Notes (Signed)
Established Patient Office Visit  Subjective:  Patient ID: THERSIA Benton, female    DOB: December 17, 1934  Age: 87 y.o. MRN: [id number]  CC:  Chief Complaint  Patient presents with   Hip Pain    Pt stated--right side of the hip still having pain but when taking the tylenol. Pt requesting refill on lancet and test strip.    HPI [name] is here today for follow up.  She is still having ride sided upper back pain on and off She is having to take tylenol to help with the pain, but it is much improved. She states today not really even there.  She states when vacuuming and doing a lot of house work and or movement she feels increase in the back pain.   Glucose in the urine however last A1c was 6.3 will assess again today in office.  Lab Results  Component Value Date   HGBA1C 6.7 (A) 04/27/2021   Liver cysts 2 cm found on CT abd/pelvis however <4 cm so no f/u recommended. No ruq abd pain. Lfts acceptable from 04/14/21.   Past Medical History:  Diagnosis Date   Allergy    hay fever   Arthritis    Cataract    resolved with surgery   Chicken pox    Colon polyp    Diabetes mellitus 2008   Generalized headaches    thinks caused by glaucoma   Glaucoma    Hypertension    Right sided sciatica 07/03/2013   Thyroid disease    hypothyroidism   Urinary incontinence    UTI (urinary tract infection)     Past Surgical History:  Procedure Laterality Date   APPENDECTOMY  1998   BUNIONECTOMY     CHOLECYSTECTOMY     COLONOSCOPY  2007   int hem, poor bowel prep, normal barium enema in following ([facility])   COLONOSCOPY WITH PROPOFOL N/A 07/22/2016   diverticulosis [name], Darren, MD)   EYE SURGERY     R  eye-lid drop surgery   TONSILLECTOMY      Family History  Problem Relation Age of Onset   Heart disease Mother    Asthma Mother    Heart attack Mother 39       aneurysm ruptured by heart   Heart disease Father    Deep vein thrombosis Father    Leukemia Brother    Cancer  Brother    Diabetes Sister    Crohn's disease Brother    Diabetes Brother    Arthritis Other    Diabetes Other    Cancer Maternal Aunt        ovarian?   Cancer Maternal Grandfather        colon   Breast cancer Neg Hx     Social History   Socioeconomic History   Marital status: Married    Spouse name: Not on file   Number of children: Not on file   Years of education: Not on file   Highest education level: Not on file  Occupational History   Not on file  Tobacco Use   Smoking status: Never   Smokeless tobacco: Never  Vaping Use   Vaping Use: Never used  Substance and Sexual Activity   Alcohol use: Not Currently    Alcohol/week: 0.0 standard drinks    Comment: occasional   Drug use: No   Sexual activity: Yes  Other Topics Concern   Not on file  Social History Narrative   Lives in [place],  from [place]. Daughter lives here. 4 daughters.   From [place]   Work - retired Data processing manager   Diet - regular   Exercise - bike daily at home   Social Determinants of Radio broadcast assistant Strain: Low Risk    Difficulty of Paying Living Expenses: Not very hard  Food Insecurity: Not on file  Transportation Needs: Not on file  Physical Activity: Not on file  Stress: Not on file  Social Connections: Not on file  Intimate Partner Violence: Not on file    Outpatient Medications Prior to Visit  Medication Sig Dispense Refill   amLODipine (NORVASC) 5 MG tablet Take 1 tablet (5 mg total) by mouth daily. 30 tablet 6   ascorbic acid (VITAMIN C) 500 MG tablet Take 1 tablet (500 mg total) by mouth daily.     atenolol (TENORMIN) 50 MG tablet TAKE 1 TABLET BY MOUTH  DAILY 90 tablet 0   atorvastatin (LIPITOR) 20 MG tablet TAKE 1 TABLET BY MOUTH WEEKLY 12 tablet 0   Blood Glucose Monitoring Suppl (ONE TOUCH ULTRA SYSTEM KIT) W/DEVICE KIT 1 kit by Does not apply route once. 1 each 0   Cholecalciferol (VITAMIN D3) 25 MCG (1000 UT) CAPS Take 1 capsule (1,000 Units total) by mouth daily. 30  capsule    Coenzyme Q10 (COQ10) 100 MG CAPS Take 1 capsule by mouth daily.     cyanocobalamin (V-R VITAMIN B-12) 500 MCG tablet Take 1 tablet (500 mcg total) by mouth daily.     Lancets (ONETOUCH DELICA PLUS [id number]) MISC CHECK BLOOD SUGAR ONCE A DAY 100 each 3   Lancets Misc. (ACCU-CHEK SOFTCLIX LANCET DEV) KIT Use at home to test blood sugars daily. 1 kit 0   levocetirizine (XYZAL) 5 MG tablet TAKE 1 TABLET(5 MG) BY MOUTH EVERY EVENING 30 tablet 5   levothyroxine (SYNTHROID) 112 MCG tablet TAKE 1 TABLET BY MOUTH  DAILY 90 tablet 0   losartan (COZAAR) 100 MG tablet Take 1 tablet (100 mg total) by mouth daily. 90 tablet 3   montelukast (SINGULAIR) 10 MG tablet TAKE 1 TABLET BY MOUTH  DAILY 90 tablet 0   omeprazole (PRILOSEC) 40 MG capsule TAKE 1 CAPSULE BY MOUTH  DAILY 90 capsule 0   glucose blood (ONETOUCH ULTRA) test strip USE TO CHECK BLOOD SUGAR ONCE DAILY 100 strip 3   Lancet Device MISC One touch delica - Use as directed 1 each 1   No facility-administered medications prior to visit.    Allergies  Allergen Reactions   Align [Acidophilus]     Worsened abd bloating, pressure   Augmentin [Amoxicillin-Pot Clavulanate]     Possible, had itchy rash   Cefprozil Swelling   Metformin And Related Other (See Comments)    Severe stomach pains, muscle aches   Timolol     Local reaction to eye drops    ROS Review of Systems  Constitutional:  Negative for chills, fatigue, fever and unexpected weight change.  Eyes:  Negative for visual disturbance.  Respiratory:  Negative for shortness of breath.   Cardiovascular:  Negative for chest pain.  Gastrointestinal:  Negative for abdominal pain.  Genitourinary:  Negative for difficulty urinating.  Musculoskeletal:  Positive for arthralgias (right mid to low back pain, aggravated by movement. intermittent).  Skin:  Negative for rash.  Neurological:  Negative for dizziness and headaches.      Objective:    Physical Exam Constitutional:       General: She is not in acute distress.  Appearance: Normal appearance. She is normal weight. She is not ill-appearing, toxic-appearing or diaphoretic.  Cardiovascular:     Rate and Rhythm: Normal rate and regular rhythm.  Pulmonary:     Effort: Pulmonary effort is normal.  Musculoskeletal:       Back:  Skin:    General: Skin is warm.  Neurological:     Mental Status: She is alert.     BP 128/72    Pulse 82    Ht _0  (1.626 m)    Wt 162 lb (73.5 kg)    SpO2 98%    BMI 27.81 kg/m  Wt Readings from Last 3 Encounters:  04/27/21 162 lb (73.5 kg)  04/14/21 164 lb (74.4 kg)  04/02/21 162 lb (73.5 kg)     Health Maintenance Due  Topic Date Due   TETANUS/TDAP  Never done   Zoster Vaccines- Shingrix (1 of 2) Never done   COVID-19 Vaccine (4 - Booster for Pfizer series) 01/14/2020   OPHTHALMOLOGY EXAM  01/26/2020   INFLUENZA VACCINE  09/15/2020    There are no preventive care reminders to display for this patient.  Lab Results  Component Value Date   TSH 0.88 03/28/2020   Lab Results  Component Value Date   WBC 5.7 04/14/2021   HGB 13.5 04/14/2021   HCT 40.9 04/14/2021   MCV 89.8 04/14/2021   PLT 199.0 04/14/2021   Lab Results  Component Value Date   NA 139 04/14/2021   K 4.4 04/14/2021   CO2 30 04/14/2021   GLUCOSE 131 (H) 04/14/2021   BUN 19 04/14/2021   CREATININE 0.99 04/14/2021   BILITOT 0.6 04/14/2021   ALKPHOS 67 04/14/2021   AST 21 04/14/2021   ALT 26 04/14/2021   PROT 7.1 04/14/2021   ALBUMIN 4.3 04/14/2021   CALCIUM 9.5 04/14/2021   ANIONGAP 7 01/27/2018   GFR 52.47 (L) 04/14/2021   Lab Results  Component Value Date   CHOL 127 03/28/2020   Lab Results  Component Value Date   HDL 50.10 03/28/2020   Lab Results  Component Value Date   LDLCALC 63 03/28/2020   Lab Results  Component Value Date   TRIG 70.0 03/28/2020   Lab Results  Component Value Date   CHOLHDL 3 03/28/2020   Lab Results  Component Value Date   HGBA1C 6.7 (A)  04/27/2021      Assessment & Plan:   Problem List Items Addressed This Visit       Endocrine   Type 2 diabetes, controlled, with peripheral neuropathy ([place])    Checking a1c today, which is ok but slightly increased. Pt to work on diabetic diet and exercise as tolerated.       Relevant Medications   Lancet Device MISC   glucose blood (ONETOUCH ULTRA) test strip   Other Relevant Orders   POCT glycosylated hemoglobin (Hb A1C) (Completed)     Musculoskeletal and Integument   Thoracic back sprain    Aggravated by movement suspected muscle strain as improving with tylenol prn. Advised pt to use heat compresses and try not to overuse muscle. Rest as needed. Ice also will help. Tylenol prn. Pt to work on stretching exercises as well         Other   Glucosuria - Primary    Repeat urinalysis as well as urine culture today, pending results. Will check a1c today in office.      Relevant Orders   Urinalysis, Routine w reflex microscopic (Completed)   Urine  Culture    Meds ordered this encounter  Medications   Lancet Device MISC    Sig: One touch delica - Use as directed    Dispense:  1 each    Refill:  1   glucose blood (ONETOUCH ULTRA) test strip    Sig: USE TO CHECK BLOOD SUGAR ONCE DAILY    Dispense:  100 strip    Refill:  3    Follow-up: No follow-ups on file.    Eugenia Pancoast, FNP

## 2021-04-27 NOTE — Assessment & Plan Note (Signed)
Repeat urinalysis as well as urine culture today, pending results. Will check a1c today in office. ?

## 2021-04-27 NOTE — Assessment & Plan Note (Signed)
Aggravated by movement suspected muscle strain as improving with tylenol prn. Advised pt to use heat compresses and try not to overuse muscle. Rest as needed. Ice also will help. Tylenol prn. Pt to work on stretching exercises as well  ?

## 2021-04-27 NOTE — Assessment & Plan Note (Signed)
Checking a1c today, which is ok but slightly increased. Pt to work on diabetic diet and exercise as tolerated.  ?

## 2021-04-28 LAB — URINE CULTURE
MICRO NUMBER:: 13122404
SPECIMEN QUALITY:: ADEQUATE

## 2021-04-30 ENCOUNTER — Other Ambulatory Visit: Payer: Self-pay | Admitting: Family Medicine

## 2021-05-20 ENCOUNTER — Other Ambulatory Visit: Payer: Self-pay | Admitting: Family Medicine

## 2021-06-01 ENCOUNTER — Other Ambulatory Visit: Payer: Self-pay | Admitting: Family Medicine

## 2021-06-03 ENCOUNTER — Other Ambulatory Visit (INDEPENDENT_AMBULATORY_CARE_PROVIDER_SITE_OTHER): Payer: Medicare Other

## 2021-06-03 ENCOUNTER — Other Ambulatory Visit: Payer: Self-pay | Admitting: Family Medicine

## 2021-06-03 DIAGNOSIS — E1142 Type 2 diabetes mellitus with diabetic polyneuropathy: Secondary | ICD-10-CM

## 2021-06-03 DIAGNOSIS — E039 Hypothyroidism, unspecified: Secondary | ICD-10-CM

## 2021-06-03 DIAGNOSIS — E538 Deficiency of other specified B group vitamins: Secondary | ICD-10-CM

## 2021-06-03 DIAGNOSIS — M85852 Other specified disorders of bone density and structure, left thigh: Secondary | ICD-10-CM | POA: Diagnosis not present

## 2021-06-03 LAB — COMPREHENSIVE METABOLIC PANEL
ALT: 23 U/L (ref 0–35)
AST: 16 U/L (ref 0–37)
Albumin: 4.4 g/dL (ref 3.5–5.2)
Alkaline Phosphatase: 72 U/L (ref 39–117)
BUN: 17 mg/dL (ref 6–23)
CO2: 30 mEq/L (ref 19–32)
Calcium: 9.5 mg/dL (ref 8.4–10.5)
Chloride: 105 mEq/L (ref 96–112)
Creatinine, Ser: 0.74 mg/dL (ref 0.40–1.20)
GFR: 74.33 mL/min (ref >60.00)
Glucose, Bld: 110 mg/dL — ABNORMAL HIGH (ref 70–99)
Potassium: 4.2 mEq/L (ref 3.5–5.1)
Sodium: 142 mEq/L (ref 135–145)
Total Bilirubin: 0.7 mg/dL (ref 0.2–1.2)
Total Protein: 6.9 g/dL (ref 6.0–8.3)

## 2021-06-03 LAB — CBC WITH DIFFERENTIAL/PLATELET
Basophils Absolute: 0 10*3/uL (ref 0.0–0.1)
Basophils Relative: 0.2 % (ref 0.0–3.0)
Eosinophils Absolute: 0.1 10*3/uL (ref 0.0–0.7)
Eosinophils Relative: 1.9 % (ref 0.0–5.0)
HCT: 41.5 % (ref 36.0–46.0)
Hemoglobin: 13.7 g/dL (ref 12.0–15.0)
Lymphocytes Relative: 33.3 % (ref 12.0–46.0)
Lymphs Abs: 1.8 10*3/uL (ref 0.7–4.0)
MCHC: 33 g/dL (ref 30.0–36.0)
MCV: 91.4 fl (ref 78.0–100.0)
Monocytes Absolute: 0.5 10*3/uL (ref 0.1–1.0)
Monocytes Relative: 8.8 % (ref 3.0–12.0)
Neutro Abs: 3 10*3/uL (ref 1.4–7.7)
Neutrophils Relative %: 55.8 % (ref 43.0–77.0)
Platelets: 210 10*3/uL (ref 150.0–400.0)
RBC: 4.54 Mil/uL (ref 3.87–5.11)
RDW: 14.5 % (ref 11.5–15.5)
WBC: 5.5 10*3/uL (ref 4.0–10.5)

## 2021-06-03 LAB — LIPID PANEL
Cholesterol: 114 mg/dL (ref 0–200)
HDL: 51.7 mg/dL (ref >39.00)
LDL Cholesterol: 48 mg/dL (ref 0–99)
NonHDL: 62
Total CHOL/HDL Ratio: 2
Triglycerides: 70 mg/dL (ref 0.0–149.0)
VLDL: 14 mg/dL (ref 0.0–40.0)

## 2021-06-03 LAB — VITAMIN D 25 HYDROXY (VIT D DEFICIENCY, FRACTURES): VITD: 34.22 ng/mL (ref 30.00–100.00)

## 2021-06-03 LAB — MICROALBUMIN / CREATININE URINE RATIO
Creatinine,U: 108.8 mg/dL
Microalb Creat Ratio: 2.6 mg/g (ref 0.0–30.0)
Microalb, Ur: 2.9 mg/dL — ABNORMAL HIGH (ref 0.0–1.9)

## 2021-06-03 LAB — TSH: TSH: 0.1 u[IU]/mL — ABNORMAL LOW (ref 0.35–5.50)

## 2021-06-03 LAB — VITAMIN B12: Vitamin B-12: 849 pg/mL (ref 211–911)

## 2021-06-03 LAB — HEMOGLOBIN A1C: Hgb A1c MFr Bld: 6.7 % — ABNORMAL HIGH (ref 4.6–6.5)

## 2021-06-05 ENCOUNTER — Telehealth: Payer: Self-pay

## 2021-06-05 NOTE — Telephone Encounter (Signed)
-----   Message from Ria Bush, MD sent at 06/04/2021 11:32 PM EDT ----- ?To discuss at [facility] in detail however plz notify thyroid function returned a bit too high - rec once day a week take levothyroxine 121mg 1/2 tablet only.  ?

## 2021-06-05 NOTE — Telephone Encounter (Signed)
Lvm asking pt to call back.  Need to relay Dr. Synthia Innocent message concerning lab results (see Labs, Result Notes- 06/03/21). ? ?Results/Dr. G's message: ?Will discuss in detail at [facility], however, your thyroid function returned a bit too high. I recommend once day a week take levothyroxine 17mg 1/2 tablet only and all other days continue taking 1 whole tablet. ?

## 2021-06-05 NOTE — Telephone Encounter (Signed)
Left message on voicemail to return my call.  

## 2021-06-05 NOTE — Telephone Encounter (Signed)
Spoke to pt

## 2021-06-05 NOTE — Telephone Encounter (Signed)
Spoke to pt. Stated she understood the regimen. ?

## 2021-06-10 ENCOUNTER — Encounter: Payer: Self-pay | Admitting: Family Medicine

## 2021-06-10 ENCOUNTER — Ambulatory Visit (INDEPENDENT_AMBULATORY_CARE_PROVIDER_SITE_OTHER): Payer: Medicare Other | Admitting: *Deleted

## 2021-06-10 ENCOUNTER — Ambulatory Visit (INDEPENDENT_AMBULATORY_CARE_PROVIDER_SITE_OTHER): Payer: Medicare Other | Admitting: Family Medicine

## 2021-06-10 VITALS — BP 124/72 | HR 59 | Temp 97.5°F | Ht 61.5 in | Wt 160.5 lb

## 2021-06-10 DIAGNOSIS — M85852 Other specified disorders of bone density and structure, left thigh: Secondary | ICD-10-CM

## 2021-06-10 DIAGNOSIS — H9193 Unspecified hearing loss, bilateral: Secondary | ICD-10-CM

## 2021-06-10 DIAGNOSIS — R7989 Other specified abnormal findings of blood chemistry: Secondary | ICD-10-CM

## 2021-06-10 DIAGNOSIS — E039 Hypothyroidism, unspecified: Secondary | ICD-10-CM

## 2021-06-10 DIAGNOSIS — E538 Deficiency of other specified B group vitamins: Secondary | ICD-10-CM

## 2021-06-10 DIAGNOSIS — E1142 Type 2 diabetes mellitus with diabetic polyneuropathy: Secondary | ICD-10-CM

## 2021-06-10 DIAGNOSIS — M2550 Pain in unspecified joint: Secondary | ICD-10-CM

## 2021-06-10 DIAGNOSIS — Z0000 Encounter for general adult medical examination without abnormal findings: Secondary | ICD-10-CM | POA: Diagnosis not present

## 2021-06-10 DIAGNOSIS — I10 Essential (primary) hypertension: Secondary | ICD-10-CM

## 2021-06-10 DIAGNOSIS — R413 Other amnesia: Secondary | ICD-10-CM

## 2021-06-10 DIAGNOSIS — Z7189 Other specified counseling: Secondary | ICD-10-CM

## 2021-06-10 DIAGNOSIS — H409 Unspecified glaucoma: Secondary | ICD-10-CM

## 2021-06-10 MED ORDER — LEVOTHYROXINE SODIUM 100 MCG PO TABS: 100.0000 ug | ORAL_TABLET | Freq: Every day | ORAL | 6 refills | Status: DC

## 2021-06-10 NOTE — Progress Notes (Signed)
? ? Patient ID: [name], female    DOB: [birth year], 87 y.o.   MRN: [id number] ? ?This visit was conducted in person. ? ?BP 124/72   Pulse (!) 59   Temp (!) 97.5 ?F (36.4 ?C) (Temporal)   Ht 5' 1.5" (1.562 m)   Wt 160 lb 8 oz (72.8 kg)   SpO2 96%   BMI 29.84 kg/m?   ? ?CC: AMW/CPE ?Subjective:  ? ?HPI: ?[name] is a 87 y.o. female presenting on 06/10/2021 for Medicare Wellness (Pt accompanied by husband, Pilar Plate. ) ? ? ?Did not see health advisor this year  ? ?Hearing Screening  ? 500Hz  1000Hz  2000Hz  4000Hz   ?Right ear 25 40 0 0  ?Left ear 25 40 0 0  ?Vision Screening - Comments:: Last eye exam, 04/2021.  ?[place] Office Visit from 06/10/2021 in [facility] at [facility]  ?PHQ-2 Total Score 0  ? ?  ?Discussed hearing screen, to let me know if interested in further evaluation with audiology referral.  ? ?  06/10/2021  ? 10:34 AM 03/31/2020  ? 11:33 AM 02/02/2019  ?  9:08 AM 01/31/2018  ? 10:30 AM 01/27/2017  ? 10:28 AM  ?Fall Risk   ?Falls in the past year? 0 0 0 1 No  ?Comment    lost balance and fell on floor in bedroom   ?Number falls in past yr:   0 0   ?Injury with Fall?   0 0   ?Risk for fall due to :   Medication side effect    ?Follow up   Falls evaluation completed;Falls prevention discussed    ? ?Recent thyroid function mildly elevated - levothyroxine dropped to 160mg daily, 1/2 tablet once weekly.  ? ?Saw Tabitha in last few months for dysuria and abd pain, UCx reassuring, CT scan also reassuring.  ? ?Notes ongoing joint pains.  ? ?POAG followed by [facility] ? ?Preventative: ?COLONOSCOPY WITH PROPOFOL 07/22/2016 diverticulosis (Allen Norris Darren, MD) - age out ?Well woman exam - Last normal pap 2014. Aged out. Saw [facility] 03/2021, note reviewed.  ?Breast cancer screening - [facility] 09/[facility]?DEXA scan 2014 T -1.5 hip osteopenia.  ?DEXA 06/2020 - T -1.6 at L femur neck (osteopenia), not high risk for fracture. ?Good calcium intake and vit D 1000 IU. Discussed  weight bearing exercise - limited.  ?Lung cancer screening - not indicated ?Flu shot - yearly  ?COVID vaccine Pfizer 02/2019 x2, booster 11/2019 ?Tetanus shot - declines  ?Prevnar-13 2015, pneumovax 2013  ?Shingrix - discussed  ?Advanced directive discussion - does not have available. Planning on seeing lawyer to work on this. Would want daughter BHassan Rowanto be HCPOA. Handout provided today.  ?Seat belt use discussed.  ?Sunscreen use discussed. No changing moles on skin.  ?Non smoker  ?Alcohol rare - rare wine ?Dentist yearly  ?Eye exam regularly Q4 months - known open angle glaucoma  ?Bowel - no constipation  ?Bladder - no incontinence  ?  ?[place]aughter lives here. 4 daughters ?Work - retired sData processing manager?Diet - avoids sugar ?Exercise - stationary bike daily at home  ?   ? ?Relevant past medical, surgical, family and social history reviewed and updated as indicated. Interim medical history since our last visit reviewed. ?Allergies and medications reviewed and updated. ?Outpatient Medications Prior to Visit  ?Medication Sig Dispense Refill  ? amLODipine (NORVASC) 5 MG tablet Take 1 tablet (5 mg total) by mouth daily.  30 tablet 6  ? ascorbic acid (VITAMIN C) 500 MG tablet Take 1 tablet (500 mg total) by mouth daily.    ? atenolol (TENORMIN) 50 MG tablet TAKE 1 TABLET BY MOUTH DAILY 90 tablet 3  ? atorvastatin (LIPITOR) 20 MG tablet TAKE 1 TABLET BY MOUTH WEEKLY 12 tablet 0  ? Blood Glucose Monitoring Suppl (ONE TOUCH ULTRA SYSTEM KIT) W/DEVICE KIT 1 kit by Does not apply route once. 1 each 0  ? Cholecalciferol (VITAMIN D3) 25 MCG (1000 UT) CAPS Take 1 capsule (1,000 Units total) by mouth daily. 30 capsule   ? Coenzyme Q10 (COQ10) 100 MG CAPS Take 1 capsule by mouth daily.    ? cyanocobalamin (V-R VITAMIN B-12) 500 MCG tablet Take 1 tablet (500 mcg total) by mouth daily.    ? glucose blood (ONETOUCH ULTRA) test strip USE TO CHECK BLOOD SUGAR ONCE DAILY 100 strip 3  ? Lancet Device MISC One touch  delica - Use as directed 1 each 1  ? Lancets (ONETOUCH DELICA PLUS [id number]) MISC CHECK BLOOD SUGAR ONCE A DAY 100 each 3  ? Lancets Misc. (ACCU-CHEK SOFTCLIX LANCET DEV) KIT Use at home to test blood sugars daily. 1 kit 0  ? levocetirizine (XYZAL) 5 MG tablet TAKE 1 TABLET(5 MG) BY MOUTH EVERY EVENING 30 tablet 5  ? losartan (COZAAR) 100 MG tablet Take 1 tablet (100 mg total) by mouth daily. 90 tablet 3  ? montelukast (SINGULAIR) 10 MG tablet TAKE 1 TABLET BY MOUTH DAILY 90 tablet 3  ? omeprazole (PRILOSEC) 40 MG capsule TAKE 1 CAPSULE BY MOUTH  DAILY 90 capsule 0  ? levothyroxine (SYNTHROID) 112 MCG tablet TAKE 1 TABLET BY MOUTH DAILY 90 tablet 0  ? ?No facility-administered medications prior to visit.  ?  ? ?Per HPI unless specifically indicated in ROS section below ?Review of Systems  ?Constitutional:  Negative for activity change, appetite change, chills, fatigue, fever and unexpected weight change.  ?HENT:  Negative for hearing loss.   ?Eyes:  Negative for visual disturbance.  ?Respiratory:  Negative for cough, chest tightness, shortness of breath and wheezing.   ?Cardiovascular:  Positive for leg swelling (ankles). Negative for chest pain and palpitations.  ?Gastrointestinal:  Negative for abdominal distention, abdominal pain, blood in stool, constipation, diarrhea, nausea and vomiting.  ?Endocrine: Positive for cold intolerance.  ?Genitourinary:  Negative for difficulty urinating and hematuria.  ?Musculoskeletal:  Negative for arthralgias, myalgias and neck pain.  ?Skin:  Negative for rash.  ?Neurological:  Positive for headaches (with elevated BPs). Negative for dizziness, seizures and syncope.  ?Hematological:  Negative for adenopathy. Does not bruise/bleed easily.  ?Psychiatric/Behavioral:  Negative for dysphoric mood. The patient is not nervous/anxious.   ? ?Objective:  ?BP 124/72   Pulse (!) 59   Temp (!) 97.5 ?F (36.4 ?C) (Temporal)   Ht 5' 1.5" (1.562 m)   Wt 160 lb 8 oz (72.8 kg)   SpO2 96%    BMI 29.84 kg/m?   ?Wt Readings from Last 3 Encounters:  ?06/10/21 160 lb 8 oz (72.8 kg)  ?04/27/21 162 lb (73.5 kg)  ?04/14/21 164 lb (74.4 kg)  ?  ?  ?Physical Exam ?Vitals and nursing note reviewed.  ?Constitutional:   ?   Appearance: Normal appearance. She is not ill-appearing.  ?HENT:  ?   Head: Normocephalic and atraumatic.  ?   Right Ear: Tympanic membrane, ear canal and external ear normal. There is no impacted cerumen.  ?   Left Ear: Tympanic membrane,  ear canal and external ear normal. There is no impacted cerumen.  ?Eyes:  ?   General:     ?   Right eye: No discharge.     ?   Left eye: No discharge.  ?   Extraocular Movements: Extraocular movements intact.  ?   Conjunctiva/sclera: Conjunctivae normal.  ?   Pupils: Pupils are equal, round, and reactive to light.  ?Neck:  ?   Thyroid: No thyroid mass or thyromegaly.  ?   Vascular: No carotid bruit.  ?Cardiovascular:  ?   Rate and Rhythm: Normal rate and regular rhythm.  ?   Pulses: Normal pulses.  ?   Heart sounds: Murmur (2/6 systolic (h/o mild MVR)) heard.  ?Pulmonary:  ?   Effort: Pulmonary effort is normal. No respiratory distress.  ?   Breath sounds: Normal breath sounds. No wheezing, rhonchi or rales.  ?Abdominal:  ?   General: Bowel sounds are normal. There is no distension.  ?   Palpations: Abdomen is soft. There is no mass.  ?   Tenderness: There is no abdominal tenderness. There is no guarding or rebound.  ?   Hernia: No hernia is present.  ?Musculoskeletal:  ?   Cervical back: Normal range of motion and neck supple. No rigidity.  ?   Right lower leg: No edema.  ?   Left lower leg: No edema.  ?Lymphadenopathy:  ?   Cervical: No cervical adenopathy.  ?Skin: ?   General: Skin is warm and dry.  ?   Findings: No rash.  ?Neurological:  ?   General: No focal deficit present.  ?   Mental Status: She is alert. Mental status is at baseline.  ?   Comments:  ?Recall 3/3, 1/3 with recall ?Calculation 3/5 ODNOD  ?Psychiatric:     ?   Mood and Affect: Mood  normal.     ?   Behavior: Behavior normal.  ? ?   ?Results for orders placed or performed in visit on 06/03/21  ?Vitamin B12  ?Result Value Ref Range  ? Vitamin B-12 849 211 - 911 pg/mL  ?VITAMIN D 25 Hydroxy (Vit-D Defici

## 2021-06-10 NOTE — Patient Instructions (Addendum)
Baje dosis de levothryoxina a 165mg diarios - nueva pastilla en la farmacia. Haga cita para sangre/laboratorios 4-6 semanas despues de comenzar nueva dosis.  ?Puede tratar o turmeric '500mg'$  2-3 veces al dia (anti inflamatorio natural) o glucosamina (para ayudar lubricacion de articulaciones).  ?If interested, check with pharmacy about new 2 shot shingles series (shingrix).  ?Revise si tiene living will/advanced directive y traigame copia. Le he dado un paquete hoy de informacion.  ?Regresar en en 4 meses para proxima visita.  ? ?Mantenimiento de la salud despu?s de los 85a?os de edad ?Health Maintenance After Age 85?Despu?s de los 65 a?os de edad, corre un riesgo mayor de pTourist information centre managerenfermedades e infecciones a lBarrister's clerk como tambi?n de sufrir lesiones por ca?das. Las ca?das son la causa principal de las fracturas de huesos y lesiones en la cabeza de personas mayores de 85a?os de edad. Recibir cuidados preventivos de forma regular puede ayudarlo a mantenerse saludable y en buen eLenora Los cuidados preventivos incluyen realizarse an?lisis de forma regular y rActoren el estilo de vida seg?n las recomendaciones del m?dico. Converse con el m?dico sobre lo siguiente: ?Las pruebas de detecci?n y los an?lisis que debe rDispensing optician Una prueba de detecci?n es un estudio que se para dHydrographic surveyorla presencia de una enfermedad cuando no tiene s?ntomas. ?Un plan de dieta y ejercicios adecuado para usted. ??Qu? debo saber sobre las pruebas de detecci?n y los an?lisis para prevenir ca?das? ?Realizarse pruebas de detecci?n y an?lisis es la mejor manera de dHydrographic surveyorun problema de salud de forma temprana. El diagn?stico y tratamiento tempranos le brindan la mejor oportunidad de cChief Technology Officerlas afecciones m?dicas que son comunes despu?s de los 85a?os de edad. Ciertas afecciones y elecciones de estilo de vida pueden hacer que sea m?s propenso a sufrir una ca?da. El m?dico puede recomendarle lo siguiente: ?Controles  regulares de la visi?n. Una visi?n deficiente y aLehigh Acrescataratas pueden hacer que sea m?s propenso a sufrir una ca?da. Si uCanadalentes, aseg?rese de obtener una receta actualizada si su visi?n cambia. ?Revisi?n de medicamentos. Revise regularmente con el m?dico todos los medicamentos que toma, incluidos los medicamentos de vMina Consulte al m?dico sComcastsecundarios que pueden hacer que sea m?s propenso a sufrir una ca?da. Informe al m?dico si alguno de los medicamentos que toma lo hace sentir mareado o somnoliento. ?Controles de fuerza y equilibrio. El m?dico puede recomendar ciertos estudios para controlar su fuerza y equilibrio al estar de pie, al caminar o al cambiar de posici?n. ?Examen de los pies. El dolor y eChiropractoren los pies, como tambi?n no utilizar el calzado aGuilford Center pueden hacer que sea m?s propenso a sufrir una ca?da. ?Pruebas de detecci?n, que incluyen las siguientes: ?Pruebas de detecci?n para la osteoporosis. La osteoporosis es una afecci?n que hace que los huesos se tornen m?s d?biles y se quiebren con m?s facilidad. ?Pruebas de detecci?n para la presi?n arterial. Los cambios en la presi?n arterial y los medicamentos para controlar la presi?n arterial pueden hacerlo sentir mareado. ?Prueba de detecci?n de la depresi?n. Es m?s probable que sufra una ca?da si tiene miedo a caerse, se siente deprimido o se siente incapaz de rLexicographer ?Prueba de detecci?n de consumo de alcohol. Beber demasiado alcohol puede afectar su equilibrio y pSPX Corporationque sea m?s propenso a sufrir una ca?da. ?Siga estas indicaciones en su casa: ?Estilo de vida ?No beba alcohol si: ?Su m?dico le indica no hacerlo. ?Si bebe  alcohol: ?Limite la cantidad que bebe a lo siguiente: ?De 0 a 1 medida por d?a para las mujeres. ?De 0 a 2 medidas por d?a para los hombres. ?Sepa cu?nta cantidad de alcohol hay en las bebidas que toma. En los [place], una medida  equivale a una botella de cerveza de 12 oz (355 ml), un vaso de vino de 5 oz (148 ml) o un vaso de una bebida alcoh?lica de alta graduaci?n de 1? oz (44 ml). ?No consuma ning?n producto que contenga nicotina o tabaco. Estos productos incluyen cigarrillos, tabaco para mascar y aparatos de vapeo, como los cigarrillos electr?nicos. Si necesita ayuda para dejar de consumir estos productos, consulte al m?dico. ?Actividad ? ?Siga un programa de ejercicio regular para mantenerse en forma. Esto lo ayudar? a Visual merchandiser. Consulte al m?dico qu? tipos de ejercicios son adecuados para usted. ?Si necesita un bast?n o un andador, ?selo seg?n las recomendaciones del m?dico. ?Utilice calzado con buen apoyo y suela antideslizante. ?Seguridad ? ?Retire los [place] puedan causar tropiezos tales como alfombras, cables u obst?culos. ?Instale equipos de seguridad, como barras para sost?n en los ba?os y barandas de seguridad en las escaleras. ?[place] habitaciones y los pasillos bien iluminados. ?Indicaciones generales ?Hable con el m?dico sobre sus riesgos de sufrir una ca?da. Inf?rmele a su m?dico si: ?Se cae. Aseg?rese de informarle a su m?dico acerca de todas las ca?das, incluso aquellas que parecen ser [employer] ?Se siente mareado, cansado (tiene fatiga) o siente que pierde el equilibrio. ?Use los medicamentos de venta libre y los recetados solamente como se lo haya indicado el m?dico. Estos incluyen suplementos. ?Siga una dieta sana y [place] un peso saludable. Una dieta saludable incluye productos l?cteos descremados, carnes bajas en contenido de grasa ([place]), fibra de granos enteros, frijoles y [place] frutas y verduras. ?Mant?ngase al d?a con las vacunas. ?Real?cese los estudios de rutina de la salud, dentales y de Public librarian. ?Resumen ?Tener un estilo de vida saludable y recibir cuidados preventivos pueden ayudar a Theatre stage manager salud y el bienestar despu?s de los 85 a?os de [place]. ?Realizarse pruebas de  detecci?n y an?lisis es la mejor manera de Hydrographic surveyor un problema de salud de forma temprana y Lourena Simmonds a Product/process development scientist una ca?da. El diagn?stico y tratamiento tempranos le brindan la mejor oportunidad de Chief Technology Officer las afecciones m?dicas m?s comunes en las personas mayores de 64 a?os de edad. ?Las ca?das son la causa principal de las fracturas de huesos y lesiones en la cabeza de personas mayores de 102 a?os de edad. Tome precauciones para evitar una ca?da en su casa. ?Trabaje con el m?dico para saber qu? cambios que puede hacer para mejorar su salud y [place], y [place]. ?Esta informaci?n no tiene Marine scientist el consejo del m?dico. Aseg?rese de hacerle al m?dico cualquier pregunta que tenga. ?Document Revised: 07/09/2020 Document Reviewed: 07/09/2020 ?Elsevier Patient Education ? [place]. ? ?

## 2021-06-11 ENCOUNTER — Ambulatory Visit: Payer: Medicare Other

## 2021-06-11 DIAGNOSIS — R413 Other amnesia: Secondary | ICD-10-CM | POA: Insufficient documentation

## 2021-06-11 NOTE — Assessment & Plan Note (Signed)
Followed by eye doctor.  

## 2021-06-11 NOTE — Assessment & Plan Note (Signed)

## 2021-06-11 NOTE — Assessment & Plan Note (Addendum)
Evidence of overcorrected thyroid - will drop levothyroxine dose to 165mg daily, rec recheck levels in 4-6 wks (ordered).  ?

## 2021-06-11 NOTE — Assessment & Plan Note (Signed)
Poor results with testing calculation and recall today.  Clinically doing well. Consider MMSE for more formal evaluation.

## 2021-06-11 NOTE — Assessment & Plan Note (Signed)
Ongoing difficulty, declines audiology eval.  ?

## 2021-06-11 NOTE — Assessment & Plan Note (Signed)
Continue oral replacement.  ?

## 2021-06-11 NOTE — Assessment & Plan Note (Signed)
Advanced directive discussion - does not have available. Planning on seeing lawyer to work on this. Would want daughter Hassan Rowan to be HCPOA. Handout provided today.  ?

## 2021-06-11 NOTE — Assessment & Plan Note (Signed)
Chronic, diet controlled - discussed limiting sugar and carbs in diet ?

## 2021-06-11 NOTE — Assessment & Plan Note (Signed)
Preventative protocols reviewed and updated unless pt declined. Discussed healthy diet and lifestyle.  

## 2021-06-11 NOTE — Assessment & Plan Note (Signed)
Discussed latest DEXA 2022 - rec good calcium intake, regular vitamin D 1000 IU daily, regular weight bearing exercise.  ?

## 2021-06-11 NOTE — Assessment & Plan Note (Signed)
Chronic, stable. Continue current regimen. 

## 2021-06-11 NOTE — Assessment & Plan Note (Signed)
Anticipate OA related - discussed supplements that may be of benefit including turmeric, glucosamine.  ?

## 2021-06-15 DIAGNOSIS — U071 COVID-19: Secondary | ICD-10-CM

## 2021-06-15 HISTORY — DX: COVID-19: U07.1

## 2021-06-18 ENCOUNTER — Other Ambulatory Visit: Payer: Self-pay | Admitting: Family Medicine

## 2021-07-06 ENCOUNTER — Telehealth: Payer: Medicare Other

## 2021-07-08 ENCOUNTER — Ambulatory Visit (INDEPENDENT_AMBULATORY_CARE_PROVIDER_SITE_OTHER): Payer: Medicare Other | Admitting: Family Medicine

## 2021-07-08 ENCOUNTER — Encounter: Payer: Self-pay | Admitting: Family Medicine

## 2021-07-08 VITALS — BP 122/60 | HR 51 | Temp 97.9°F | Ht 61.5 in | Wt 161.2 lb

## 2021-07-08 DIAGNOSIS — J069 Acute upper respiratory infection, unspecified: Secondary | ICD-10-CM | POA: Insufficient documentation

## 2021-07-08 DIAGNOSIS — J019 Acute sinusitis, unspecified: Secondary | ICD-10-CM | POA: Diagnosis not present

## 2021-07-08 MED ORDER — AZITHROMYCIN 250 MG PO TABS: ORAL_TABLET | ORAL | 0 refills | Status: DC

## 2021-07-08 MED ORDER — PREDNISONE 10 MG PO TABS: ORAL_TABLET | ORAL | 0 refills | Status: DC

## 2021-07-08 NOTE — Patient Instructions (Addendum)
Rest  Drink fluids  Take the azithromycin and prednisone for sinus infection and wheezing   Update if not starting to improve in a week or if worsening     Let's check a PCR covid test  Results usually take 2 days

## 2021-07-08 NOTE — Progress Notes (Signed)
Subjective:    Patient ID: [name], female    DOB: 1934/10/09, 87 y.o.   MRN: [id number]  HPI 87 yo pt of Dr Darnell Level presents for cough and congestion   Wt Readings from Last 3 Encounters:  07/08/21 161 lb 4 oz (73.1 kg)  06/10/21 160 lb 8 oz (72.8 kg)  04/27/21 162 lb (73.5 kg)   29.97 kg/m  Started Sunday night   (just coming home from a cruise)    husb was quarantined with covid on the trip   Bad cold  Coughing - productive : yellow at times or clear  Some wheezing Sunday night /that is better   Sinus pain/pressure : mucous is white  Fatigue  No fever   No chills or aches  Ears -occ hurt  Throat hurts     2 negative covid tests   Husband had covid recently , never felt sick but just had a positive test   Otc:  Tylenol  Got cough pills from drugstore in [place]  Chlorpheniramine DM   She does have h/o chronic cough /under pulm care  [place]   Patient Active Problem List   Diagnosis Date Noted   Viral URI with cough 07/08/2021   Memory difficulty 06/11/2021   Vertigo 06/06/2020   Bilateral hearing loss 04/03/2020   Syncope and collapse 11/14/2019   Non-restorative sleep 11/14/2019   Rosacea 11/14/2019   Varicose veins of right lower extremity with inflammation 02/15/2019   Lumbar back pain with radiculopathy affecting right lower extremity 02/02/2018   Chronic cough 09/20/2017   Lesion of lip 05/19/2017   Right lower quadrant abdominal pain 04/21/2017   Lactose intolerance 04/21/2017   Fatty liver disease, nonalcoholic 73/53/2992   Abdominal discomfort, epigastric 11/26/2016   Low vitamin B12 level 06/24/2016   Thoracic radiculitis 06/04/2016   Advanced care planning/counseling discussion 12/25/2015   Polyarthralgia 04/17/2015   Abnormal CXR 01/17/2015   Health maintenance examination 01/02/2015   Snoring 01/02/2015   Leg cramps 12/27/2013   Acute sinusitis 12/27/2013   Skin growth 09/25/2013   Allergic rhinitis 03/23/2013    History of Helicobacter pylori infection 01/15/2013   Medicare annual wellness visit, subsequent 09/14/2012   Osteopenia 06/01/2012   Hypertension 11/29/2011   Glaucoma 11/29/2011   Type 2 diabetes, controlled, with peripheral neuropathy ([place]) 07/22/2011   Hypothyroidism 07/22/2011   Past Medical History:  Diagnosis Date   Allergy    hay fever   Arthritis    Cataract    resolved with surgery   Chicken pox    Colon polyp    Diabetes mellitus 2008   Generalized headaches    thinks caused by glaucoma   Glaucoma    Hypertension    Right sided sciatica 07/03/2013   Thyroid disease    hypothyroidism   Urinary incontinence    UTI (urinary tract infection)    Past Surgical History:  Procedure Laterality Date   APPENDECTOMY  1998   BUNIONECTOMY     CHOLECYSTECTOMY     COLONOSCOPY  2007   int hem, poor bowel prep, normal barium enema in following ([place])   COLONOSCOPY WITH PROPOFOL N/A 07/22/2016   diverticulosis Allen Norris, Darren, MD)   EYE SURGERY     R  eye-lid drop surgery   TONSILLECTOMY     Social History   Tobacco Use   Smoking status: Never   Smokeless tobacco: Never  Vaping Use   Vaping Use: Never used  Substance Use Topics  Alcohol use: Not Currently    Alcohol/week: 0.0 standard drinks    Comment: occasional   Drug use: No   Family History  Problem Relation Age of Onset   Heart disease Mother    Asthma Mother    Heart attack Mother 84       aneurysm ruptured by heart   Heart disease Father    Deep vein thrombosis Father    Leukemia Brother    Cancer Brother    Diabetes Sister    Crohn's disease Brother    Diabetes Brother    Arthritis Other    Diabetes Other    Cancer Maternal Aunt        ovarian?   Cancer Maternal Grandfather        colon   Breast cancer Neg Hx    Allergies  Allergen Reactions   Align [Acidophilus]     Worsened abd bloating, pressure   Augmentin [Amoxicillin-Pot Clavulanate]     Possible, had itchy rash   Cefprozil  Swelling   Metformin And Related Other (See Comments)    Severe stomach pains, muscle aches   Timolol     Local reaction to eye drops   Current Outpatient Medications on File Prior to Visit  Medication Sig Dispense Refill   amLODipine (NORVASC) 5 MG tablet Take 1 tablet (5 mg total) by mouth daily. 30 tablet 6   ascorbic acid (VITAMIN C) 500 MG tablet Take 1 tablet (500 mg total) by mouth daily.     atenolol (TENORMIN) 50 MG tablet TAKE 1 TABLET BY MOUTH DAILY 90 tablet 3   atorvastatin (LIPITOR) 20 MG tablet TAKE 1 TABLET BY MOUTH WEEKLY 12 tablet 0   Blood Glucose Monitoring Suppl (ONE TOUCH ULTRA SYSTEM KIT) W/DEVICE KIT 1 kit by Does not apply route once. 1 each 0   Cholecalciferol (VITAMIN D3) 25 MCG (1000 UT) CAPS Take 1 capsule (1,000 Units total) by mouth daily. 30 capsule    Coenzyme Q10 (COQ10) 100 MG CAPS Take 1 capsule by mouth daily.     cyanocobalamin (V-R VITAMIN B-12) 500 MCG tablet Take 1 tablet (500 mcg total) by mouth daily.     glucose blood (ONETOUCH ULTRA) test strip USE TO CHECK BLOOD SUGAR ONCE DAILY 100 strip 3   Lancet Device MISC One touch delica - Use as directed 1 each 1   Lancets (ONETOUCH DELICA PLUS [id number]) MISC CHECK BLOOD SUGAR ONCE A DAY 100 each 3   Lancets Misc. (ACCU-CHEK SOFTCLIX LANCET DEV) KIT Use at home to test blood sugars daily. 1 kit 0   levocetirizine (XYZAL) 5 MG tablet TAKE 1 TABLET(5 MG) BY MOUTH EVERY EVENING 30 tablet 5   levothyroxine (SYNTHROID) 100 MCG tablet Take 1 tablet (100 mcg total) by mouth daily. 30 tablet 6   losartan (COZAAR) 100 MG tablet Take 1 tablet (100 mg total) by mouth daily. 90 tablet 3   montelukast (SINGULAIR) 10 MG tablet TAKE 1 TABLET BY MOUTH DAILY 90 tablet 3   omeprazole (PRILOSEC) 40 MG capsule TAKE 1 CAPSULE BY MOUTH DAILY 90 capsule 3   No current facility-administered medications on file prior to visit.   Review of Systems  Constitutional:  Positive for malaise/fatigue. Negative for chills and fever.   HENT:  Positive for congestion, sinus pain and sore throat. Negative for ear pain.   Eyes:  Negative for blurred vision, discharge and redness.  Respiratory:  Positive for cough, sputum production and wheezing. Negative for shortness of breath and  stridor.   Cardiovascular:  Negative for chest pain, palpitations and leg swelling.  Gastrointestinal:  Negative for abdominal pain, diarrhea, nausea and vomiting.  Musculoskeletal:  Negative for myalgias.  Skin:  Negative for rash.  Neurological:  Positive for headaches. Negative for dizziness.    Review of Systems  Constitutional:  Positive for malaise/fatigue. Negative for chills and fever.  HENT:  Positive for congestion, sinus pain and sore throat. Negative for ear pain.   Eyes:  Negative for blurred vision, discharge and redness.  Respiratory:  Positive for cough, sputum production and wheezing. Negative for shortness of breath and stridor.   Cardiovascular:  Negative for chest pain, palpitations and leg swelling.  Gastrointestinal:  Negative for abdominal pain, diarrhea, nausea and vomiting.  Musculoskeletal:  Negative for myalgias.  Skin:  Negative for rash.  Neurological:  Positive for headaches. Negative for dizziness.      Objective:   Physical Exam Constitutional:      General: She is not in acute distress.    Appearance: Normal appearance. She is well-developed. She is obese. She is not ill-appearing.  HENT:     Head: Normocephalic and atraumatic.     Comments: Nares are injected and congested  Clear pnd   Bilateral maxillary sinus tenderness     Right Ear: Tympanic membrane, ear canal and external ear normal.     Left Ear: Tympanic membrane, ear canal and external ear normal.     Nose: Congestion and rhinorrhea present.     Mouth/Throat:     Mouth: Mucous membranes are moist.     Pharynx: No oropharyngeal exudate or posterior oropharyngeal erythema.     Comments: Clear pnd Eyes:     General:        Right eye: No  discharge.        Left eye: No discharge.     Conjunctiva/sclera: Conjunctivae normal.     Pupils: Pupils are equal, round, and reactive to light.  Cardiovascular:     Rate and Rhythm: Normal rate and regular rhythm.  Pulmonary:     Effort: Pulmonary effort is normal. No respiratory distress.     Breath sounds: Normal breath sounds. No stridor. No wheezing, rhonchi or rales.     Comments: Good air exch  Scant wheeze on forced exp Overall good air exch Musculoskeletal:     Cervical back: Normal range of motion and neck supple.  Lymphadenopathy:     Cervical: No cervical adenopathy.  Skin:    General: Skin is warm and dry.     Findings: No rash.  Neurological:     Mental Status: She is alert.     Cranial Nerves: No cranial nerve deficit.  Psychiatric:        Mood and Affect: Mood normal.          Assessment & Plan:   Problem List Items Addressed This Visit       Respiratory   Acute sinusitis - Primary    With viral uri  Purulent nasal mucous/facial pain/headache  Px azithromycin (pcn all) Also prednisone for wheezing/should also help congestion  Disc sympt care ER prec reviewed  Update if not starting to improve in a week or if worsening         Relevant Medications   azithromycin (ZITHROMAX Z-PAK) 250 MG tablet   predniSONE (DELTASONE) 10 MG tablet   Viral URI with cough    This began after a cruise Husband had covid /quarantined   She has tested neg  times 2 (rapid) req pcr test  Done and pending  inst to continue isolation        Relevant Medications   azithromycin (ZITHROMAX Z-PAK) 250 MG tablet   Other Relevant Orders   Novel Coronavirus, NAA ([facility])

## 2021-07-08 NOTE — Assessment & Plan Note (Signed)
With viral uri  Purulent nasal mucous/facial pain/headache  Px azithromycin (pcn all) Also prednisone for wheezing/should also help congestion  Disc sympt care ER prec reviewed  Update if not starting to improve in a week or if worsening

## 2021-07-08 NOTE — Assessment & Plan Note (Signed)
This began after a cruise Husband had covid /quarantined   She has tested neg times 2 (rapid) req pcr test  Done and pending  inst to continue isolation

## 2021-07-09 LAB — SPECIMEN STATUS REPORT

## 2021-07-09 LAB — NOVEL CORONAVIRUS, NAA: SARS-CoV-2, NAA: DETECTED — AB

## 2021-07-16 ENCOUNTER — Encounter: Payer: Self-pay | Admitting: Family Medicine

## 2021-07-21 ENCOUNTER — Other Ambulatory Visit (INDEPENDENT_AMBULATORY_CARE_PROVIDER_SITE_OTHER): Payer: Medicare Other

## 2021-07-21 DIAGNOSIS — E039 Hypothyroidism, unspecified: Secondary | ICD-10-CM | POA: Diagnosis not present

## 2021-07-21 LAB — T4, FREE: Free T4: 0.96 ng/dL (ref 0.60–1.60)

## 2021-07-21 LAB — TSH: TSH: 1.36 u[IU]/mL (ref 0.35–5.50)

## 2021-07-22 ENCOUNTER — Other Ambulatory Visit: Payer: Self-pay | Admitting: Family Medicine

## 2021-07-22 MED ORDER — LEVOTHYROXINE SODIUM 100 MCG PO TABS: 100.0000 ug | ORAL_TABLET | Freq: Every day | ORAL | 2 refills | Status: DC

## 2021-08-03 ENCOUNTER — Other Ambulatory Visit: Payer: Self-pay | Admitting: Family Medicine

## 2021-08-04 ENCOUNTER — Other Ambulatory Visit: Payer: Self-pay | Admitting: Family Medicine

## 2021-08-19 ENCOUNTER — Other Ambulatory Visit: Payer: Self-pay | Admitting: Family Medicine

## 2021-08-28 NOTE — Telephone Encounter (Signed)
error 

## 2021-09-09 ENCOUNTER — Other Ambulatory Visit: Payer: Self-pay | Admitting: Family Medicine

## 2021-09-09 DIAGNOSIS — Z1231 Encounter for screening mammogram for malignant neoplasm of breast: Secondary | ICD-10-CM

## 2021-09-16 DIAGNOSIS — E1142 Type 2 diabetes mellitus with diabetic polyneuropathy: Secondary | ICD-10-CM | POA: Diagnosis not present

## 2021-09-16 DIAGNOSIS — H401133 Primary open-angle glaucoma, bilateral, severe stage: Secondary | ICD-10-CM | POA: Diagnosis not present

## 2021-10-02 ENCOUNTER — Ambulatory Visit
Admission: RE | Admit: 2021-10-02 | Discharge: 2021-10-02 | Disposition: A | Discharge status: Home / Self Care | Payer: Medicare Other | Source: Ambulatory Visit | Attending: Family Medicine | Admitting: Family Medicine

## 2021-10-02 DIAGNOSIS — Z1231 Encounter for screening mammogram for malignant neoplasm of breast: Secondary | ICD-10-CM | POA: Diagnosis not present

## 2021-10-12 ENCOUNTER — Ambulatory Visit (INDEPENDENT_AMBULATORY_CARE_PROVIDER_SITE_OTHER)
Admission: RE | Admit: 2021-10-12 | Discharge: 2021-10-12 | Disposition: A | Discharge status: Home / Self Care | Payer: Medicare Other | Source: Ambulatory Visit | Attending: Family Medicine | Admitting: Family Medicine

## 2021-10-12 ENCOUNTER — Ambulatory Visit (INDEPENDENT_AMBULATORY_CARE_PROVIDER_SITE_OTHER): Payer: Medicare Other | Admitting: Family Medicine

## 2021-10-12 ENCOUNTER — Encounter: Payer: Self-pay | Admitting: Family Medicine

## 2021-10-12 VITALS — BP 126/74 | HR 60 | Temp 98.0°F | Ht 61.5 in | Wt 162.1 lb

## 2021-10-12 DIAGNOSIS — M1712 Unilateral primary osteoarthritis, left knee: Secondary | ICD-10-CM | POA: Diagnosis not present

## 2021-10-12 DIAGNOSIS — M25562 Pain in left knee: Secondary | ICD-10-CM | POA: Diagnosis not present

## 2021-10-12 DIAGNOSIS — D699 Hemorrhagic condition, unspecified: Secondary | ICD-10-CM | POA: Insufficient documentation

## 2021-10-12 DIAGNOSIS — G8929 Other chronic pain: Secondary | ICD-10-CM | POA: Diagnosis not present

## 2021-10-12 DIAGNOSIS — E1142 Type 2 diabetes mellitus with diabetic polyneuropathy: Secondary | ICD-10-CM | POA: Diagnosis not present

## 2021-10-12 DIAGNOSIS — I872 Venous insufficiency (chronic) (peripheral): Secondary | ICD-10-CM | POA: Diagnosis not present

## 2021-10-12 DIAGNOSIS — R1013 Epigastric pain: Secondary | ICD-10-CM

## 2021-10-12 DIAGNOSIS — M25561 Pain in right knee: Secondary | ICD-10-CM

## 2021-10-12 LAB — POCT GLYCOSYLATED HEMOGLOBIN (HGB A1C): Hemoglobin A1C: 6.4 % — AB (ref 4.0–5.6)

## 2021-10-12 MED ORDER — MONTELUKAST SODIUM 10 MG PO TABS
10.0000 mg | ORAL_TABLET | Freq: Every day | ORAL | 3 refills | Status: DC
Start: 2021-10-12 — End: 2022-06-07

## 2021-10-12 MED ORDER — OMEPRAZOLE 40 MG PO CPDR: 40.0000 mg | DELAYED_RELEASE_CAPSULE | Freq: Every day | ORAL | 3 refills | Status: DC

## 2021-10-12 MED ORDER — ATENOLOL 50 MG PO TABS: 50.0000 mg | ORAL_TABLET | Freq: Every day | ORAL | 3 refills | Status: DC

## 2021-10-12 MED ORDER — ATORVASTATIN CALCIUM 20 MG PO TABS: 20.0000 mg | ORAL_TABLET | ORAL | 3 refills | Status: DC

## 2021-10-12 NOTE — Patient Instructions (Addendum)
We will request records from [facility] center Dr Darrin Luis for latest diabetic eye exam. [phone] (Fax)   Su azucar esta muy bien.  Baje omeprazole a cada otro dia.  Rayos x de [place].  Use medias de compresion de bajo grado.  Regresar en 4 meses para proxima visita.

## 2021-10-12 NOTE — Assessment & Plan Note (Signed)
Presumed some symptoms related to reflux - she's done well with daily omeprazole '40mg'$  but is worried about long term effect of PPI on kidneys. Suggested try tapering dose to QOD, update with effect.

## 2021-10-12 NOTE — Assessment & Plan Note (Signed)
Chronic, A1c well controlled only on dietary measures.

## 2021-10-12 NOTE — Assessment & Plan Note (Addendum)
Discussed this, rec leg elevation, limiting salt in diet, increasing water and compression stocking use. She finds Grade 2 compression intolerable - Rx written for knee high grade 1 compression stockings (15-54mHg).  Consider VVS eval if not improved with above.  Good pulses point against PAD.

## 2021-10-12 NOTE — Assessment & Plan Note (Signed)
Longstanding issue, L>R knees. Anticipate osteoarthritis related pain. Will check baseline films.

## 2021-10-12 NOTE — Progress Notes (Signed)
Patient ID: [name], female    DOB: [birth year], 87 y.o.   MRN: [id number]  This visit was conducted in person.  BP 126/74   Pulse 60   Temp 98 F (36.7 C) (Temporal)   Ht 5' 1.5" (1.562 m)   Wt 162 lb 2 oz (73.5 kg)   SpO2 97%   BMI 30.14 kg/m    CC: 4 mo f/u visit  Subjective:   HPI: [name] is a 87 y.o. female presenting on 10/12/2021 for Follow-up (Here for 4 mo f/u.)   Notes worsening knee pain, L>R. Ongoing for 6+ months. Denies inciting trauma/injury. Also with significant leg pain and fatigue with walking.   DM - does regularly check sugars - fasting 90-110, some elevated readings. Compliant with antihyperglycemic regimen which includes: diet control. Denies low sugars or hypoglycemic symptoms. Denies paresthesias, blurry vision. Last diabetic eye exam 3 wks ago - records requested. Glucometer brand: One-Touch. Last foot exam: DUE. DSME: declines. Lab Results  Component Value Date   HGBA1C 6.4 (A) 10/12/2021   Diabetic Foot Exam - Simple   Simple Foot Form Diabetic Foot exam was performed with the following findings: Yes 10/12/2021 12:02 PM  Visual Inspection No deformities, no ulcerations, no other skin breakdown bilaterally: Yes Sensation Testing See comments: Yes Pulse Check Posterior Tibialis and Dorsalis pulse intact bilaterally: Yes Comments Mildly decreased to monofilament testing    Lab Results  Component Value Date   MICROALBUR 2.9 (H) 06/03/2021         Relevant past medical, surgical, family and social history reviewed and updated as indicated. Interim medical history since our last visit reviewed. Allergies and medications reviewed and updated. Outpatient Medications Prior to Visit  Medication Sig Dispense Refill   amLODipine (NORVASC) 5 MG tablet TAKE 1 TABLET(5 MG) BY MOUTH DAILY 90 tablet 3   ascorbic acid (VITAMIN C) 500 MG tablet Take 1 tablet (500 mg total) by mouth daily.     Blood Glucose Monitoring Suppl (ONE TOUCH  ULTRA SYSTEM KIT) W/DEVICE KIT 1 kit by Does not apply route once. 1 each 0   Cholecalciferol (VITAMIN D3) 25 MCG (1000 UT) CAPS Take 1 capsule (1,000 Units total) by mouth daily. 30 capsule    Coenzyme Q10 (COQ10) 100 MG CAPS Take 1 capsule by mouth daily.     cyanocobalamin (V-R VITAMIN B-12) 500 MCG tablet Take 1 tablet (500 mcg total) by mouth daily.     glucose blood (ONETOUCH ULTRA) test strip USE TO CHECK BLOOD SUGAR ONCE DAILY 100 strip 3   Lancet Device MISC One touch delica - Use as directed 1 each 1   Lancets (ONETOUCH DELICA PLUS [id number]) MISC CHECK BLOOD SUGAR ONCE A DAY 100 each 3   Lancets Misc. (ACCU-CHEK SOFTCLIX LANCET DEV) KIT Use at home to test blood sugars daily. 1 kit 0   levothyroxine (SYNTHROID) 100 MCG tablet Take 1 tablet (100 mcg total) by mouth daily. 90 tablet 2   losartan (COZAAR) 100 MG tablet TAKE 1 TABLET BY MOUTH  DAILY 90 tablet 3   atenolol (TENORMIN) 50 MG tablet TAKE 1 TABLET BY MOUTH DAILY 90 tablet 3   atorvastatin (LIPITOR) 20 MG tablet TAKE 1 TABLET BY MOUTH WEEKLY 12 tablet 3   montelukast (SINGULAIR) 10 MG tablet TAKE 1 TABLET BY MOUTH DAILY 90 tablet 3   omeprazole (PRILOSEC) 40 MG capsule TAKE 1 CAPSULE BY MOUTH DAILY 90 capsule 3   azithromycin (ZITHROMAX Z-PAK) 250 MG tablet  Take 2 pills by mouth today and then 1 pill daily for 4 days 6 tablet 0   levocetirizine (XYZAL) 5 MG tablet TAKE 1 TABLET(5 MG) BY MOUTH EVERY EVENING 30 tablet 5   predniSONE (DELTASONE) 10 MG tablet Take 3 pills once daily by mouth for 3 days, then 2 pills once daily for 3 days, then 1 pill once daily for 3 days and then stop 18 tablet 0   No facility-administered medications prior to visit.     Per HPI unless specifically indicated in ROS section below Review of Systems  Objective:  BP 126/74   Pulse 60   Temp 98 F (36.7 C) (Temporal)   Ht 5' 1.5" (1.562 m)   Wt 162 lb 2 oz (73.5 kg)   SpO2 97%   BMI 30.14 kg/m   Wt Readings from Last 3 Encounters:   10/12/21 162 lb 2 oz (73.5 kg)  07/08/21 161 lb 4 oz (73.1 kg)  06/10/21 160 lb 8 oz (72.8 kg)      Physical Exam Vitals and nursing note reviewed.  Constitutional:      Appearance: Normal appearance. She is not ill-appearing.  Eyes:     Extraocular Movements: Extraocular movements intact.     Conjunctiva/sclera: Conjunctivae normal.     Pupils: Pupils are equal, round, and reactive to light.  Cardiovascular:     Rate and Rhythm: Normal rate and regular rhythm.     Pulses: Normal pulses.     Heart sounds: Normal heart sounds. No murmur heard. Pulmonary:     Effort: Pulmonary effort is normal. No respiratory distress.     Breath sounds: Normal breath sounds. No wheezing, rhonchi or rales.  Musculoskeletal:     Right lower leg: Edema (tr) present.     Left lower leg: Edema (tr) present.     Comments:  2+ DP bilaterally Bilateral knee exam: No deformity on inspection.  No significant pain with palpation of knee landmarks.  No effusion/swelling noted.  FROM in flex/extension without crepitus.  No popliteal fullness.  Neg drawer test.  Neg mcmurray test.  No pain with valgus/varus stress.  No PFgrind.  No abnormal patellar mobility.   Skin:    General: Skin is warm and dry.     Findings: No rash.     Comments: Inflamed enlarged varicose veins bilaterally as well as reticular and spider veins to bilateral dorsal feet  Neurological:     Mental Status: She is alert.  Psychiatric:        Mood and Affect: Mood normal.        Behavior: Behavior normal.       Results for orders placed or performed in visit on 10/12/21  POCT glycosylated hemoglobin (Hb A1C)  Result Value Ref Range   Hemoglobin A1C 6.4 (A) 4.0 - 5.6 %   HbA1c POC (<> result, manual entry)     HbA1c, POC (prediabetic range)     HbA1c, POC (controlled diabetic range)      Assessment & Plan:   Problem List Items Addressed This Visit     Type 2 diabetes, controlled, with peripheral neuropathy ([place]) -  Primary    Chronic, A1c well controlled only on dietary measures.       Relevant Medications   atorvastatin (LIPITOR) 20 MG tablet   Other Relevant Orders   POCT glycosylated hemoglobin (Hb A1C) (Completed)   Abdominal discomfort, epigastric    Presumed some symptoms related to reflux - she's done well with daily  omeprazole 33m but is worried about long term effect of PPI on kidneys. Suggested try tapering dose to QOD, update with effect.       Chronic venous insufficiency of lower extremity    Discussed this, rec leg elevation, limiting salt in diet, increasing water and compression stocking use. She finds Grade 2 compression intolerable - Rx written for knee high grade 1 compression stockings (15-24mg).  Consider VVS eval if not improved with above.  Good pulses point against PAD.       Relevant Medications   atorvastatin (LIPITOR) 20 MG tablet   atenolol (TENORMIN) 50 MG tablet   Chronic pain of both knees    Longstanding issue, L>R knees. Anticipate osteoarthritis related pain. Will check baseline films.       Relevant Orders   DG Knee 4 Views W/Patella Left     Meds ordered this encounter  Medications   atorvastatin (LIPITOR) 20 MG tablet    Sig: Take 1 tablet (20 mg total) by mouth once a week.    Dispense:  12 tablet    Refill:  3   omeprazole (PRILOSEC) 40 MG capsule    Sig: Take 1 capsule (40 mg total) by mouth daily.    Dispense:  90 capsule    Refill:  3   montelukast (SINGULAIR) 10 MG tablet    Sig: Take 1 tablet (10 mg total) by mouth daily.    Dispense:  90 tablet    Refill:  3   atenolol (TENORMIN) 50 MG tablet    Sig: Take 1 tablet (50 mg total) by mouth daily.    Dispense:  90 tablet    Refill:  3   Orders Placed This Encounter  Procedures   DG Knee 4 Views W/Patella Left    Standing Status:   Future    Number of Occurrences:   1    Standing Expiration Date:   10/13/2022    Order Specific Question:   Reason for Exam (SYMPTOM  OR DIAGNOSIS  REQUIRED)    Answer:   chronic L>R knee pain without trauma    Order Specific Question:   Preferred imaging location?    Answer:   [facility]   POCT glycosylated hemoglobin (Hb A1C)     Patient Instructions  [facility] diabetic eye [facility]. [phone])   Su azucar esta muy bien.  Baje omeprazole a cada otro dia.  Rayos x de rodilla [place].  Use medias de compresion de bajo grado.  Regresar en 4 meses para proxima visita.   Follow up plan: Return in about 4 months (around 02/11/2022) for follow up visit.  JaRia BushMD

## 2021-12-09 DIAGNOSIS — D1801 Hemangioma of skin and subcutaneous tissue: Secondary | ICD-10-CM | POA: Diagnosis not present

## 2021-12-25 ENCOUNTER — Other Ambulatory Visit: Payer: Self-pay | Admitting: Family Medicine

## 2021-12-25 DIAGNOSIS — E1142 Type 2 diabetes mellitus with diabetic polyneuropathy: Secondary | ICD-10-CM

## 2022-01-21 DIAGNOSIS — H401133 Primary open-angle glaucoma, bilateral, severe stage: Secondary | ICD-10-CM | POA: Diagnosis not present

## 2022-02-09 ENCOUNTER — Ambulatory Visit (INDEPENDENT_AMBULATORY_CARE_PROVIDER_SITE_OTHER)
Admission: RE | Admit: 2022-02-09 | Discharge: 2022-02-09 | Disposition: A | Discharge status: Home / Self Care | Payer: Medicare Other | Source: Ambulatory Visit | Attending: Family Medicine | Admitting: Family Medicine

## 2022-02-09 ENCOUNTER — Encounter: Payer: Self-pay | Admitting: Family Medicine

## 2022-02-09 ENCOUNTER — Ambulatory Visit (INDEPENDENT_AMBULATORY_CARE_PROVIDER_SITE_OTHER): Payer: Medicare Other | Admitting: Family Medicine

## 2022-02-09 VITALS — BP 120/76 | HR 63 | Temp 97.8°F | Ht 61.5 in | Wt 168.4 lb

## 2022-02-09 DIAGNOSIS — R1013 Epigastric pain: Secondary | ICD-10-CM

## 2022-02-09 DIAGNOSIS — G8929 Other chronic pain: Secondary | ICD-10-CM

## 2022-02-09 DIAGNOSIS — M25561 Pain in right knee: Secondary | ICD-10-CM

## 2022-02-09 DIAGNOSIS — M25562 Pain in left knee: Secondary | ICD-10-CM

## 2022-02-09 DIAGNOSIS — E1142 Type 2 diabetes mellitus with diabetic polyneuropathy: Secondary | ICD-10-CM

## 2022-02-09 DIAGNOSIS — M25511 Pain in right shoulder: Secondary | ICD-10-CM | POA: Diagnosis not present

## 2022-02-09 LAB — POCT GLYCOSYLATED HEMOGLOBIN (HGB A1C): Hemoglobin A1C: 6.6 % — AB (ref 4.0–5.6)

## 2022-02-09 NOTE — Patient Instructions (Addendum)
Revise con doctora de ojos si have examen diabetico (diabetic retinopathy screen).  Cambie ex-lax a miralax or colace (stool softener). Siga fibra y liquido en la dieta.  Rayos x de hombro hoy. Haga cita con Dr Lorelei Pont medicina deportiva. Siga tylenol artritis para dolor.  Return in 4 months for wellness visit/physical.

## 2022-02-09 NOTE — Progress Notes (Signed)
Patient ID: [name], female    DOB: 1934/05/03, 87 y.o.   MRN: [id number]  This visit was conducted in person.  BP 120/76   Pulse 63   Temp 97.8 F (36.6 C) (Temporal)   Ht 5' 1.5" (1.562 m)   Wt 168 lb 6.4 oz (76.4 kg)   SpO2 100%   BMI 31.30 kg/m    CC: 4 mo f/u visit  Subjective:   HPI: [name] is a 87 y.o. female presenting on 02/09/2022 for Follow-up (Here for 4 mo f/u. Pt brought in home [wrist] BP monitor to compare. Reading in office today- 138/74. Requests rx for triamcinolone cream 0.1%. )   Epigastric abd discomfort - previously well managed on omeprazole 40mg  daily. Last visit we dropped dose to QOD. She's backed off PPI, now taking ex-lax daily with benefit.   Bilateral leg and knee pain due to CVI and osteoarthritis - last visit we discussed low grade compression stockings (grade 2 and higher intolerable). L knee xray showed moderate to marked severity tricompartmental degenerative changes. Notes improvement in knee pain with regular stationary bike use.   Notes R shoulder pain for years, may have started after fall 08/2019 - braced fall with R shoulder, acutely worse the last few months. Pain can radiate into neck. Now also having left shoulder pain for the past 6 months.   DM - does regularly check sugars 125 in am. Borders prediabetes-diabetes. Compliant with antihyperglycemic regimen which includes: diet controlled. Denies low sugars or hypoglycemic symptoms. Denies paresthesias, blurry vision. Last diabetic eye exam 01/2022 - Dr Delight Hoh Reynold Bowen - records requested. Glucometer brand: one touch ultra. Last foot exam: 09/2021. DSME: not done. Lab Results  Component Value Date   HGBA1C 6.6 (A) 02/09/2022   Diabetic Foot Exam - Simple   No data filed    Lab Results  Component Value Date   MICROALBUR 2.9 (H) 06/03/2021         Relevant past medical, surgical, family and social history reviewed and updated as indicated. Interim medical  history since our last visit reviewed. Allergies and medications reviewed and updated. Outpatient Medications Prior to Visit  Medication Sig Dispense Refill   amLODipine (NORVASC) 5 MG tablet TAKE 1 TABLET(5 MG) BY MOUTH DAILY 90 tablet 3   ascorbic acid (VITAMIN C) 500 MG tablet Take 1 tablet (500 mg total) by mouth daily.     atenolol (TENORMIN) 50 MG tablet Take 1 tablet (50 mg total) by mouth daily. 90 tablet 3   atorvastatin (LIPITOR) 20 MG tablet Take 1 tablet (20 mg total) by mouth once a week. 12 tablet 3   Blood Glucose Monitoring Suppl (ONE TOUCH ULTRA SYSTEM KIT) W/DEVICE KIT 1 kit by Does not apply route once. 1 each 0   Cholecalciferol (VITAMIN D3) 25 MCG (1000 UT) CAPS Take 1 capsule (1,000 Units total) by mouth daily. 30 capsule    Coenzyme Q10 (COQ10) 100 MG CAPS Take 1 capsule by mouth daily.     cyanocobalamin (V-R VITAMIN B-12) 500 MCG tablet Take 1 tablet (500 mcg total) by mouth daily.     glucose blood (ONETOUCH ULTRA) test strip USE TO CHECK BLOOD SUGAR ONCE DAILY 100 strip 3   Lancet Device MISC One touch delica - Use as directed 1 each 1   Lancets (ONETOUCH DELICA PLUS [id number]) MISC USE AS DIRECTED TO CHECK BLOOD SUGAR ONCE A DAY 100 each 3   Lancets Misc. (ACCU-CHEK SOFTCLIX LANCET DEV) KIT  Use at home to test blood sugars daily. 1 kit 0   levothyroxine (SYNTHROID) 100 MCG tablet Take 1 tablet (100 mcg total) by mouth daily. 90 tablet 2   losartan (COZAAR) 100 MG tablet TAKE 1 TABLET BY MOUTH  DAILY 90 tablet 3   montelukast (SINGULAIR) 10 MG tablet Take 1 tablet (10 mg total) by mouth daily. 90 tablet 3   omeprazole (PRILOSEC) 40 MG capsule Take 1 capsule (40 mg total) by mouth daily. 90 capsule 3   No facility-administered medications prior to visit.     Per HPI unless specifically indicated in ROS section below Review of Systems  Objective:  BP 120/76   Pulse 63   Temp 97.8 F (36.6 C) (Temporal)   Ht 5' 1.5" (1.562 m)   Wt 168 lb 6.4 oz (76.4 kg)    SpO2 100%   BMI 31.30 kg/m   Wt Readings from Last 3 Encounters:  02/09/22 168 lb 6.4 oz (76.4 kg)  10/12/21 162 lb 2 oz (73.5 kg)  07/08/21 161 lb 4 oz (73.1 kg)      Physical Exam Vitals and nursing note reviewed.  Constitutional:      Appearance: Normal appearance. She is not ill-appearing.  HENT:     Head: Normocephalic and atraumatic.     Mouth/Throat:     Comments: Wearing mask Eyes:     Extraocular Movements: Extraocular movements intact.     Conjunctiva/sclera: Conjunctivae normal.     Pupils: Pupils are equal, round, and reactive to light.  Cardiovascular:     Rate and Rhythm: Normal rate and regular rhythm.     Pulses: Normal pulses.     Heart sounds: Normal heart sounds. No murmur heard. Pulmonary:     Effort: Pulmonary effort is normal. No respiratory distress.     Breath sounds: Normal breath sounds. No wheezing, rhonchi or rales.  Musculoskeletal:     Right lower leg: No edema.     Left lower leg: No edema.     Comments:  Bilateral shoulder exam: No deformity of shoulders on inspection. + pain with palpation of anterior and posterior shoulders. Limited ROM in abduction and forward flexion past 90 degrees R shoulder due to pain Passive ROM preserved. + pain with testing SITS in ext/int rotation. ++ pain with empty can sign. Discomfort with impingement testing bilaterally. No significant pain with rotation of humeral head in GH joint.   Skin:    General: Skin is warm and dry.     Findings: No rash.  Neurological:     Mental Status: She is alert.  Psychiatric:        Mood and Affect: Mood normal.        Behavior: Behavior normal.       Results for orders placed or performed in visit on 02/09/22  POCT glycosylated hemoglobin (Hb A1C)  Result Value Ref Range   Hemoglobin A1C 6.6 (A) 4.0 - 5.6 %   HbA1c POC (<> result, manual entry)     HbA1c, POC (prediabetic range)     HbA1c, POC (controlled diabetic range)     Lab Results  Component Value Date    WBC 5.5 06/03/2021   HGB 13.7 06/03/2021   HCT 41.5 06/03/2021   MCV 91.4 06/03/2021   PLT 210.0 06/03/2021    Lab Results  Component Value Date   CREATININE 0.74 06/03/2021   BUN 17 06/03/2021   NA 142 06/03/2021   K 4.2 06/03/2021   CL 105 06/03/2021  CO2 30 06/03/2021    DG Shoulder Right CLINICAL DATA:  Chronic right shoulder pain. Fall injury last year. No prior surgery.  EXAM: RIGHT SHOULDER - 2+ VIEW  COMPARISON:  None Available.  FINDINGS: There is osteopenia without acute fracture or dislocation. There is narrowing of the acromiohumeral space consistent with rotator cuff arthropathy and a likely chronic degenerative rotator cuff tear.  Joint narrowing and spurring is seen of the [facility] joint and inferior glenohumeral joint.  There is slight elevation of the distal clavicle compatible with a second degree AC separation, age indeterminate.  Visualized portions of the right lung are clear.  IMPRESSION: 1. Osteopenia and degenerative change without evidence of acute fracture or dislocation. 2. Narrowing of the acromiohumeral space consistent with rotator cuff arthropathy and likely chronic degenerative rotator cuff tear. 3. Slight elevation of the distal clavicle compatible with a second degree A.C. separation, age indeterminate.  Electronically Signed   By: Almira Bar M.D.   On: 02/09/2022 22:35   Assessment & Plan:   Problem List Items Addressed This Visit     Type 2 diabetes, controlled, with peripheral neuropathy ([facility]) - Primary    Chronic, stable, diet controlled. Discussed need for yearly diabetic retinopathy screen -she will check with her glaucoma doctor if this is done yearly.       Relevant Orders   POCT glycosylated hemoglobin (Hb A1C) (Completed)   Abdominal discomfort, epigastric    Stable period off PPI, managed with ex-lax - discussed switch to miralax or daily stool softener in place of daily laxative.       Chronic pain of both  knees    Known mod-severe L knee osteoarthritis by xray.  Overall improvement noted with regular stationary bike use.       Chronic right shoulder pain    Longstanding issue for years, since fall at home 2021, worse over the last few months. Exam suspicious for chronic RTC injury, possible tear. Check baseline xray - and will refer to ortho for further evaluation.       Relevant Orders   DG Shoulder Right (Completed)   Ambulatory referral to Orthopedic Surgery     No orders of the defined types were placed in this encounter.  Orders Placed This Encounter  Procedures   DG Shoulder Right    Standing Status:   Future    Number of Occurrences:   1    Standing Expiration Date:   02/10/2023    Order Specific Question:   Reason for Exam (SYMPTOM  OR DIAGNOSIS REQUIRED)    Answer:   chronic R shoulder pain    Order Specific Question:   Preferred imaging location?    Answer:   [facility]   Ambulatory referral to Orthopedic Surgery    Referral Priority:   Routine    Referral Type:   Surgical    Referral Reason:   Specialty Services Required    Requested Specialty:   Orthopedic Surgery    Number of Visits Requested:   1   POCT glycosylated hemoglobin (Hb A1C)     Patient Instructions  Revise con doctora de ojos si have examen diabetico (diabetic retinopathy screen).  Cambie ex-lax a miralax or colace (stool softener). Siga fibra y liquido en la dieta.  Rayos x de hombro hoy. Haga cita con Dr Patsy Lager medicina deportiva. Siga tylenol artritis para dolor.  Return in 4 months for wellness visit/physical.   Follow up plan: Return in about 4 months (around 06/11/2022) for  annual exam, prior fasting for blood work, medicare wellness visit.  Eustaquio Boyden, MD

## 2022-02-10 DIAGNOSIS — G8929 Other chronic pain: Secondary | ICD-10-CM | POA: Insufficient documentation

## 2022-02-10 NOTE — Assessment & Plan Note (Signed)
Stable period off PPI, managed with ex-lax - discussed switch to miralax or daily stool softener in place of daily laxative.

## 2022-02-10 NOTE — Assessment & Plan Note (Signed)
Longstanding issue for years, since fall at home 2021, worse over the last few months. Exam suspicious for chronic RTC injury, possible tear. Check baseline xray - and will refer to ortho for further evaluation.

## 2022-02-10 NOTE — Assessment & Plan Note (Signed)
Chronic, stable, diet controlled. Discussed need for yearly diabetic retinopathy screen -she will check with her glaucoma doctor if this is done yearly.

## 2022-02-10 NOTE — Assessment & Plan Note (Addendum)
Known mod-severe L knee osteoarthritis by xray.  Overall improvement noted with regular stationary bike use.

## 2022-02-11 ENCOUNTER — Encounter: Payer: Self-pay | Admitting: *Deleted

## 2022-03-01 ENCOUNTER — Other Ambulatory Visit: Payer: Self-pay | Admitting: Family Medicine

## 2022-03-01 DIAGNOSIS — E1142 Type 2 diabetes mellitus with diabetic polyneuropathy: Secondary | ICD-10-CM

## 2022-03-09 NOTE — Progress Notes (Unsigned)
    Johnette Teigen T. Kiyan Burmester, MD, [place] [facility] [place], [address]  Phone: [phone]  FAX: [place] - 88 y.o. female  MRN [id number]  Date of Birth: 1934/10/28  Date: 03/10/2022  PCP: Ria Bush, MD  Referral: Ria Bush, MD  No chief complaint on file.  Subjective:   KENEDIE DIROCCO is a 88 y.o. very pleasant female patient with There is no height or weight on file to calculate BMI. who presents with the following:  Patient presents with bilateral shoulder pain.  Right is greater than left, and this has been present for years.  I did review the patient's plain film and she does have some inferior glenohumeral spurring, consistent with mild glenohumeral osteoarthritis.  She also has an elevation of the humeral head which can be seen with chronic rotator cuff tear.  She just saw my partner Dr. Danise Mina roughly 1 month ago, and at that point he consulted orthopedic surgery.  She is here to discuss her shoulder pain with me today.    Review of Systems is noted in the HPI, as appropriate  Objective:   There were no vitals taken for this visit.  GEN: No acute distress; alert,appropriate. PULM: Breathing comfortably in no respiratory distress PSYCH: Normally interactive.   Laboratory and Imaging Data:  Assessment and Plan:   ***

## 2022-03-10 ENCOUNTER — Ambulatory Visit (INDEPENDENT_AMBULATORY_CARE_PROVIDER_SITE_OTHER): Payer: Medicare Other | Admitting: Family Medicine

## 2022-03-10 ENCOUNTER — Encounter: Payer: Self-pay | Admitting: Family Medicine

## 2022-03-10 VITALS — BP 110/70 | HR 59 | Temp 98.1°F | Ht 61.5 in | Wt 165.0 lb

## 2022-03-10 DIAGNOSIS — M25511 Pain in right shoulder: Secondary | ICD-10-CM

## 2022-03-10 DIAGNOSIS — M25512 Pain in left shoulder: Secondary | ICD-10-CM | POA: Diagnosis not present

## 2022-03-10 DIAGNOSIS — M12811 Other specific arthropathies, not elsewhere classified, right shoulder: Secondary | ICD-10-CM

## 2022-03-10 DIAGNOSIS — G8929 Other chronic pain: Secondary | ICD-10-CM | POA: Diagnosis not present

## 2022-03-10 DIAGNOSIS — M19011 Primary osteoarthritis, right shoulder: Secondary | ICD-10-CM

## 2022-03-10 MED ORDER — TRIAMCINOLONE ACETONIDE 40 MG/ML IJ SUSP
40.0000 mg | Freq: Once | INTRAMUSCULAR | Status: AC
  Administered 2022-03-10: 40 mg via INTRA_ARTICULAR

## 2022-04-02 ENCOUNTER — Other Ambulatory Visit: Payer: Self-pay

## 2022-04-02 ENCOUNTER — Inpatient Hospital Stay (HOSPITAL_COMMUNITY)
Admission: EM | Admit: 2022-04-02 | Discharge: 2022-04-06 | DRG: 312 | Disposition: A | Discharge status: Home Health | Payer: Medicare Other | Attending: Internal Medicine | Admitting: Internal Medicine

## 2022-04-02 ENCOUNTER — Emergency Department (HOSPITAL_COMMUNITY): Payer: Medicare Other

## 2022-04-02 DIAGNOSIS — Z881 Allergy status to other antibiotic agents status: Secondary | ICD-10-CM

## 2022-04-02 DIAGNOSIS — S72002A Fracture of unspecified part of neck of left femur, initial encounter for closed fracture: Secondary | ICD-10-CM | POA: Diagnosis present

## 2022-04-02 DIAGNOSIS — I951 Orthostatic hypotension: Secondary | ICD-10-CM | POA: Diagnosis present

## 2022-04-02 DIAGNOSIS — S299XXA Unspecified injury of thorax, initial encounter: Secondary | ICD-10-CM | POA: Diagnosis not present

## 2022-04-02 DIAGNOSIS — E1142 Type 2 diabetes mellitus with diabetic polyneuropathy: Secondary | ICD-10-CM | POA: Diagnosis not present

## 2022-04-02 DIAGNOSIS — T148XXA Other injury of unspecified body region, initial encounter: Secondary | ICD-10-CM

## 2022-04-02 DIAGNOSIS — E785 Hyperlipidemia, unspecified: Secondary | ICD-10-CM | POA: Diagnosis present

## 2022-04-02 DIAGNOSIS — S199XXA Unspecified injury of neck, initial encounter: Secondary | ICD-10-CM | POA: Diagnosis not present

## 2022-04-02 DIAGNOSIS — I700 Atherosclerosis of aorta: Secondary | ICD-10-CM | POA: Diagnosis not present

## 2022-04-02 DIAGNOSIS — Z888 Allergy status to other drugs, medicaments and biological substances status: Secondary | ICD-10-CM

## 2022-04-02 DIAGNOSIS — S301XXA Contusion of abdominal wall, initial encounter: Secondary | ICD-10-CM | POA: Diagnosis not present

## 2022-04-02 DIAGNOSIS — M5431 Sciatica, right side: Secondary | ICD-10-CM | POA: Diagnosis present

## 2022-04-02 DIAGNOSIS — Z8616 Personal history of COVID-19: Secondary | ICD-10-CM

## 2022-04-02 DIAGNOSIS — S32049A Unspecified fracture of fourth lumbar vertebra, initial encounter for closed fracture: Secondary | ICD-10-CM | POA: Diagnosis not present

## 2022-04-02 DIAGNOSIS — S0990XA Unspecified injury of head, initial encounter: Secondary | ICD-10-CM | POA: Diagnosis not present

## 2022-04-02 DIAGNOSIS — E039 Hypothyroidism, unspecified: Secondary | ICD-10-CM | POA: Diagnosis not present

## 2022-04-02 DIAGNOSIS — H409 Unspecified glaucoma: Secondary | ICD-10-CM | POA: Diagnosis not present

## 2022-04-02 DIAGNOSIS — Z833 Family history of diabetes mellitus: Secondary | ICD-10-CM

## 2022-04-02 DIAGNOSIS — S32009A Unspecified fracture of unspecified lumbar vertebra, initial encounter for closed fracture: Secondary | ICD-10-CM

## 2022-04-02 DIAGNOSIS — S7001XA Contusion of right hip, initial encounter: Secondary | ICD-10-CM | POA: Diagnosis present

## 2022-04-02 DIAGNOSIS — Y92410 Unspecified street and highway as the place of occurrence of the external cause: Secondary | ICD-10-CM

## 2022-04-02 DIAGNOSIS — S32592A Other specified fracture of left pubis, initial encounter for closed fracture: Secondary | ICD-10-CM | POA: Diagnosis not present

## 2022-04-02 DIAGNOSIS — R9431 Abnormal electrocardiogram [ECG] [EKG]: Secondary | ICD-10-CM | POA: Diagnosis not present

## 2022-04-02 DIAGNOSIS — Z79899 Other long term (current) drug therapy: Secondary | ICD-10-CM | POA: Diagnosis not present

## 2022-04-02 DIAGNOSIS — S32502A Unspecified fracture of left pubis, initial encounter for closed fracture: Secondary | ICD-10-CM | POA: Diagnosis not present

## 2022-04-02 DIAGNOSIS — R531 Weakness: Secondary | ICD-10-CM | POA: Diagnosis not present

## 2022-04-02 DIAGNOSIS — Z79890 Hormone replacement therapy (postmenopausal): Secondary | ICD-10-CM | POA: Diagnosis not present

## 2022-04-02 DIAGNOSIS — I10 Essential (primary) hypertension: Secondary | ICD-10-CM | POA: Diagnosis not present

## 2022-04-02 DIAGNOSIS — K5730 Diverticulosis of large intestine without perforation or abscess without bleeding: Secondary | ICD-10-CM | POA: Diagnosis not present

## 2022-04-02 DIAGNOSIS — S32059A Unspecified fracture of fifth lumbar vertebra, initial encounter for closed fracture: Secondary | ICD-10-CM | POA: Diagnosis present

## 2022-04-02 DIAGNOSIS — K449 Diaphragmatic hernia without obstruction or gangrene: Secondary | ICD-10-CM | POA: Diagnosis not present

## 2022-04-02 DIAGNOSIS — Z9049 Acquired absence of other specified parts of digestive tract: Secondary | ICD-10-CM | POA: Diagnosis not present

## 2022-04-02 DIAGNOSIS — Z8249 Family history of ischemic heart disease and other diseases of the circulatory system: Secondary | ICD-10-CM

## 2022-04-02 DIAGNOSIS — J9811 Atelectasis: Secondary | ICD-10-CM | POA: Diagnosis not present

## 2022-04-02 LAB — CBC
HCT: 46.7 % — ABNORMAL HIGH (ref 36.0–46.0)
Hemoglobin: 14.8 g/dL (ref 12.0–15.0)
MCH: 29.8 pg (ref 26.0–34.0)
MCHC: 31.7 g/dL (ref 30.0–36.0)
MCV: 94.2 fL (ref 80.0–100.0)
Platelets: 182 10*3/uL (ref 150–400)
RBC: 4.96 MIL/uL (ref 3.87–5.11)
RDW: 13.4 % (ref 11.5–15.5)
WBC: 7.5 10*3/uL (ref 4.0–10.5)
nRBC: 0 % (ref 0.0–0.2)

## 2022-04-02 LAB — LACTIC ACID, PLASMA: Lactic Acid, Venous: 1.9 mmol/L (ref 0.5–1.9)

## 2022-04-02 LAB — I-STAT CHEM 8, ED
BUN: 22 mg/dL (ref 8–23)
Calcium, Ion: 0.94 mmol/L — ABNORMAL LOW (ref 1.15–1.40)
Chloride: 104 mmol/L (ref 98–111)
Creatinine, Ser: 0.8 mg/dL (ref 0.44–1.00)
Glucose, Bld: 162 mg/dL — ABNORMAL HIGH (ref 70–99)
HCT: 47 % — ABNORMAL HIGH (ref 36.0–46.0)
Hemoglobin: 16 g/dL — ABNORMAL HIGH (ref 12.0–15.0)
Potassium: 3.8 mmol/L (ref 3.5–5.1)
Sodium: 135 mmol/L (ref 135–145)
TCO2: 22 mmol/L (ref 22–32)

## 2022-04-02 LAB — COMPREHENSIVE METABOLIC PANEL
ALT: 54 U/L — ABNORMAL HIGH (ref 0–44)
AST: 53 U/L — ABNORMAL HIGH (ref 15–41)
Albumin: 4.1 g/dL (ref 3.5–5.0)
Alkaline Phosphatase: 68 U/L (ref 38–126)
Anion gap: 12 (ref 5–15)
BUN: 19 mg/dL (ref 8–23)
CO2: 22 mmol/L (ref 22–32)
Calcium: 9.3 mg/dL (ref 8.9–10.3)
Chloride: 101 mmol/L (ref 98–111)
Creatinine, Ser: 0.87 mg/dL (ref 0.44–1.00)
GFR, Estimated: >60 mL/min (ref >60)
Glucose, Bld: 171 mg/dL — ABNORMAL HIGH (ref 70–99)
Potassium: 3.9 mmol/L (ref 3.5–5.1)
Sodium: 135 mmol/L (ref 135–145)
Total Bilirubin: 0.6 mg/dL (ref 0.3–1.2)
Total Protein: 7.1 g/dL (ref 6.5–8.1)

## 2022-04-02 LAB — ETHANOL: Alcohol, Ethyl (B): <10 mg/dL (ref <10)

## 2022-04-02 LAB — SAMPLE TO BLOOD BANK

## 2022-04-02 LAB — PROTIME-INR
INR: 1.1 (ref 0.8–1.2)
Prothrombin Time: 13.8 seconds (ref 11.4–15.2)

## 2022-04-02 MED ORDER — OXYCODONE HCL 5 MG PO TABS
5.0000 mg | ORAL_TABLET | Freq: Once | ORAL | Status: AC
  Administered 2022-04-02: 5 mg via ORAL
  Filled 2022-04-02: qty 1

## 2022-04-02 MED ORDER — IOHEXOL 350 MG/ML SOLN
70.0000 mL | Freq: Once | INTRAVENOUS | Status: AC | PRN
Start: 2022-04-02 — End: 2022-04-02
  Administered 2022-04-02: 70 mL via INTRAVENOUS
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
## 2022-04-03 ENCOUNTER — Encounter (HOSPITAL_COMMUNITY): Payer: Self-pay | Admitting: Internal Medicine

## 2022-04-03 DIAGNOSIS — E1142 Type 2 diabetes mellitus with diabetic polyneuropathy: Secondary | ICD-10-CM

## 2022-04-03 DIAGNOSIS — E039 Hypothyroidism, unspecified: Secondary | ICD-10-CM | POA: Diagnosis not present

## 2022-04-03 DIAGNOSIS — T148XXA Other injury of unspecified body region, initial encounter: Secondary | ICD-10-CM

## 2022-04-03 DIAGNOSIS — I10 Essential (primary) hypertension: Secondary | ICD-10-CM

## 2022-04-03 DIAGNOSIS — S32592A Other specified fracture of left pubis, initial encounter for closed fracture: Secondary | ICD-10-CM

## 2022-04-03 DIAGNOSIS — S32009A Unspecified fracture of unspecified lumbar vertebra, initial encounter for closed fracture: Secondary | ICD-10-CM

## 2022-04-03 HISTORY — DX: Other injury of unspecified body region, initial encounter: T14.8XXA

## 2022-04-03 HISTORY — DX: Other specified fracture of left pubis, initial encounter for closed fracture: S32.592A

## 2022-04-03 HISTORY — DX: Unspecified fracture of unspecified lumbar vertebra, initial encounter for closed fracture: S32.009A

## 2022-04-03 LAB — COMPREHENSIVE METABOLIC PANEL
ALT: 51 U/L — ABNORMAL HIGH (ref 0–44)
AST: 69 U/L — ABNORMAL HIGH (ref 15–41)
Albumin: 3.3 g/dL — ABNORMAL LOW (ref 3.5–5.0)
Alkaline Phosphatase: 51 U/L (ref 38–126)
Anion gap: 9 (ref 5–15)
BUN: 17 mg/dL (ref 8–23)
CO2: 24 mmol/L (ref 22–32)
Calcium: 8.6 mg/dL — ABNORMAL LOW (ref 8.9–10.3)
Chloride: 102 mmol/L (ref 98–111)
Creatinine, Ser: 0.91 mg/dL (ref 0.44–1.00)
GFR, Estimated: >60 mL/min (ref >60)
Glucose, Bld: 218 mg/dL — ABNORMAL HIGH (ref 70–99)
Potassium: 3.7 mmol/L (ref 3.5–5.1)
Sodium: 135 mmol/L (ref 135–145)
Total Bilirubin: 0.5 mg/dL (ref 0.3–1.2)
Total Protein: 5.9 g/dL — ABNORMAL LOW (ref 6.5–8.1)

## 2022-04-03 LAB — GLUCOSE, CAPILLARY
Glucose-Capillary: 141 mg/dL — ABNORMAL HIGH (ref 70–99)
Glucose-Capillary: 164 mg/dL — ABNORMAL HIGH (ref 70–99)
Glucose-Capillary: 185 mg/dL — ABNORMAL HIGH (ref 70–99)
Glucose-Capillary: 171 mg/dL — ABNORMAL HIGH (ref 70–99)
Glucose-Capillary: 138 mg/dL — ABNORMAL HIGH (ref 70–99)

## 2022-04-03 LAB — CBC WITH DIFFERENTIAL/PLATELET
Abs Immature Granulocytes: 0.04 10*3/uL (ref 0.00–0.07)
Basophils Absolute: 0 10*3/uL (ref 0.0–0.1)
Basophils Relative: 0 %
Eosinophils Absolute: 0 10*3/uL (ref 0.0–0.5)
Eosinophils Relative: 0 %
HCT: 38.7 % (ref 36.0–46.0)
Hemoglobin: 13.3 g/dL (ref 12.0–15.0)
Immature Granulocytes: 1 %
Lymphocytes Relative: 14 %
Lymphs Abs: 1 10*3/uL (ref 0.7–4.0)
MCH: 31.1 pg (ref 26.0–34.0)
MCHC: 34.4 g/dL (ref 30.0–36.0)
MCV: 90.6 fL (ref 80.0–100.0)
Monocytes Absolute: 0.5 10*3/uL (ref 0.1–1.0)
Monocytes Relative: 7 %
Neutro Abs: 5.8 10*3/uL (ref 1.7–7.7)
Neutrophils Relative %: 78 %
Platelets: 169 10*3/uL (ref 150–400)
RBC: 4.27 MIL/uL (ref 3.87–5.11)
RDW: 13.6 % (ref 11.5–15.5)
WBC: 7.4 10*3/uL (ref 4.0–10.5)
nRBC: 0 % (ref 0.0–0.2)

## 2022-04-03 LAB — HEMOGLOBIN A1C
Hgb A1c MFr Bld: 6.4 % — ABNORMAL HIGH (ref 4.8–5.6)
Mean Plasma Glucose: 136.98 mg/dL

## 2022-04-03 MED ORDER — METHOCARBAMOL 1000 MG/10ML IJ SOLN: 500.0000 mg | Freq: Four times a day (QID) | INTRAVENOUS | Status: DC | PRN

## 2022-04-03 MED ORDER — AMLODIPINE BESYLATE 2.5 MG PO TABS: 2.5000 mg | ORAL_TABLET | Freq: Every day | ORAL | 0 refills | Status: DC

## 2022-04-03 MED ORDER — ATORVASTATIN CALCIUM 10 MG PO TABS
20.0000 mg | ORAL_TABLET | ORAL | Status: DC
  Administered 2022-04-03: 20 mg via ORAL
  Filled 2022-04-03: qty 2

## 2022-04-03 MED ORDER — LOSARTAN POTASSIUM 50 MG PO TABS
100.0000 mg | ORAL_TABLET | Freq: Every day | ORAL | Status: DC
  Administered 2022-04-03 – 2022-04-05 (×3): 100 mg via ORAL
  Filled 2022-04-03 (×3): qty 2

## 2022-04-03 MED ORDER — ACETAMINOPHEN 650 MG RE SUPP: 650.0000 mg | Freq: Four times a day (QID) | RECTAL | Status: DC | PRN

## 2022-04-03 MED ORDER — INSULIN ASPART 100 UNIT/ML IJ SOLN: 0.0000 [IU] | Freq: Every day | INTRAMUSCULAR | Status: DC

## 2022-04-03 MED ORDER — OXYCODONE HCL 5 MG PO TABS
5.0000 mg | ORAL_TABLET | ORAL | Status: DC | PRN
  Administered 2022-04-03 – 2022-04-06 (×5): 5 mg via ORAL
  Filled 2022-04-03 (×5): qty 1

## 2022-04-03 MED ORDER — DOCUSATE SODIUM 100 MG PO CAPS: 200.0000 mg | ORAL_CAPSULE | Freq: Every day | ORAL | 0 refills | Status: DC

## 2022-04-03 MED ORDER — ATENOLOL 50 MG PO TABS
50.0000 mg | ORAL_TABLET | Freq: Every day | ORAL | Status: DC
  Administered 2022-04-03 – 2022-04-06 (×4): 50 mg via ORAL
  Filled 2022-04-03 (×4): qty 1

## 2022-04-03 MED ORDER — PANTOPRAZOLE SODIUM 40 MG PO TBEC
40.0000 mg | DELAYED_RELEASE_TABLET | Freq: Every day | ORAL | Status: DC
  Administered 2022-04-03 – 2022-04-06 (×4): 40 mg via ORAL
  Filled 2022-04-03 (×4): qty 1

## 2022-04-03 MED ORDER — DOCUSATE SODIUM 100 MG PO CAPS
200.0000 mg | ORAL_CAPSULE | Freq: Every day | ORAL | Status: DC
  Administered 2022-04-03 – 2022-04-06 (×4): 200 mg via ORAL
  Filled 2022-04-03 (×4): qty 2

## 2022-04-03 MED ORDER — ONDANSETRON HCL 4 MG/2ML IJ SOLN: 4.0000 mg | Freq: Four times a day (QID) | INTRAMUSCULAR | Status: DC | PRN

## 2022-04-03 MED ORDER — INSULIN ASPART 100 UNIT/ML IJ SOLN
0.0000 [IU] | Freq: Three times a day (TID) | INTRAMUSCULAR | Status: DC
  Administered 2022-04-03 (×2): 2 [IU] via SUBCUTANEOUS
  Administered 2022-04-04 (×2): 1 [IU] via SUBCUTANEOUS
  Administered 2022-04-04: 2 [IU] via SUBCUTANEOUS
  Administered 2022-04-05 – 2022-04-06 (×3): 1 [IU] via SUBCUTANEOUS

## 2022-04-03 MED ORDER — OXYCODONE HCL 5 MG PO TABS: 5.0000 mg | ORAL_TABLET | ORAL | 0 refills | Status: AC | PRN

## 2022-04-03 MED ORDER — LEVOTHYROXINE SODIUM 100 MCG PO TABS
100.0000 ug | ORAL_TABLET | Freq: Every day | ORAL | Status: DC
  Administered 2022-04-03 – 2022-04-06 (×4): 100 ug via ORAL
  Filled 2022-04-03 (×4): qty 1

## 2022-04-03 MED ORDER — ACETAMINOPHEN 325 MG PO TABS
650.0000 mg | ORAL_TABLET | Freq: Four times a day (QID) | ORAL | Status: DC | PRN
  Administered 2022-04-03 – 2022-04-05 (×2): 650 mg via ORAL
  Filled 2022-04-03 (×2): qty 2

## 2022-04-03 MED ORDER — MELATONIN 5 MG PO TABS: 10.0000 mg | ORAL_TABLET | Freq: Every evening | ORAL | Status: DC | PRN

## 2022-04-03 MED ORDER — AMLODIPINE BESYLATE 5 MG PO TABS
5.0000 mg | ORAL_TABLET | Freq: Every day | ORAL | Status: DC
  Administered 2022-04-03: 5 mg via ORAL
  Filled 2022-04-03: qty 1

## 2022-04-03 MED ORDER — ONDANSETRON HCL 4 MG PO TABS: 4.0000 mg | ORAL_TABLET | Freq: Four times a day (QID) | ORAL | Status: DC | PRN
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
## 2022-04-03 NOTE — TOC Transition Note (Signed)
Transition of Care [facility]) - CM/SW Discharge Note   Patient Details  Name: [name] MRN: [id number] Date of Birth: 07-15-1934  Transition of Care [facility]) CM/SW Contact:  Tom-Johnson, Renea Ee, RN Phone Number: 04/03/2022, 3:55 PM   Clinical Narrative:     Patient is scheduled for discharge today. Home health referral called in to [facility] and [facility] voiced acceptance, info on AVS. BSC and RW ordered from [employer], Jasmine to deliver to patient at bedside prior to discharge. Family to transport at discharge. No further TOC needs noted.         Final next level of care: Home w Home Health Services Barriers to Discharge: Barriers Resolved   Patient Goals and CMS Choice CMS Medicare.gov Compare Post Acute Care list provided to:: Patient Choice offered to / list presented to : Patient  Discharge Placement                  Patient to be transferred to facility by: Family      Discharge Plan and Services Additional resources added to the After Visit Summary for                  DME Arranged: Bedside commode, Walker rolling DME Agency: [employer] Date DME Agency Contacted: 04/03/22 Time DME Agency Contacted: [id number] Representative spoke with at DME Agency: [place]: PT, OT [place] Agency: [place] Date [place]: 04/03/22 Time [place]: [place] Representative spoke with at [place]: [place] Determinants of Health ([place]) Interventions SDOH Screenings   Food Insecurity: No Food Insecurity (04/03/2022)  Housing: Low Risk  (04/03/2022)  Transportation Needs: No Transportation Needs (04/03/2022)  Utilities: Not At Risk (04/03/2022)  Alcohol Screen: Low Risk  (02/02/2019)  Depression (PHQ2-9): Low Risk  (06/10/2021)  Financial Resource Strain: Low Risk  (11/20/2020)  Physical Activity: Insufficiently Active (02/02/2019)  Stress: No Stress Concern Present (02/02/2019)  Tobacco Use: Low Risk  (04/03/2022)      Readmission Risk Interventions     No data to display

## 2022-04-03 NOTE — Care Management Obs Status (Signed)
[facility] NOTIFICATION   Patient Details  Name: MALANNI DEVOY MRN: [id number] Date of Birth: 1934/10/10   Medicare Observation Status Notification Given:  Yes    Tom-Johnson, Renea Ee, RN 04/03/2022, 3:12 PM
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
## 2022-04-03 NOTE — ED Notes (Signed)
Report given to Deidre Ala, RN on 5N

## 2022-04-03 NOTE — Assessment & Plan Note (Signed)
PT consult in AM. Prn oxycodone 5 mg q4h moderate pain. Bowel program.

## 2022-04-03 NOTE — Assessment & Plan Note (Signed)
Continue synthroid 100 mcg daily.

## 2022-04-03 NOTE — Progress Notes (Cosign Needed)
    Durable Medical Equipment  (From admission, onward)           Start     Ordered   04/03/22 1553  For home use only DME Walker rolling  Once       Question Answer Comment  Walker: With 5 Inch Wheels   Patient needs a walker to treat with the following condition Gait instability      04/03/22 1552   04/03/22 1552  For home use only DME Bedside commode  Once       Comments: Patient is confined to one room and is unable to ambulate to the bathroom, therefore needing a commode at the bedside.  Question:  Patient needs a bedside commode to treat with the following condition  Answer:  Weakness   04/03/22 1552

## 2022-04-03 NOTE — Assessment & Plan Note (Signed)
Add SSI. ?
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
## 2022-04-03 NOTE — Assessment & Plan Note (Signed)
Continue atenolol 50 mg, cozaar 100 mg, norvasc 5 mg.
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
## 2022-04-04 DIAGNOSIS — Z8249 Family history of ischemic heart disease and other diseases of the circulatory system: Secondary | ICD-10-CM | POA: Diagnosis not present

## 2022-04-04 DIAGNOSIS — H409 Unspecified glaucoma: Secondary | ICD-10-CM | POA: Diagnosis present

## 2022-04-04 DIAGNOSIS — S7001XA Contusion of right hip, initial encounter: Secondary | ICD-10-CM | POA: Diagnosis present

## 2022-04-04 DIAGNOSIS — Z833 Family history of diabetes mellitus: Secondary | ICD-10-CM | POA: Diagnosis not present

## 2022-04-04 DIAGNOSIS — S32049A Unspecified fracture of fourth lumbar vertebra, initial encounter for closed fracture: Secondary | ICD-10-CM | POA: Diagnosis present

## 2022-04-04 DIAGNOSIS — S32059A Unspecified fracture of fifth lumbar vertebra, initial encounter for closed fracture: Secondary | ICD-10-CM | POA: Diagnosis present

## 2022-04-04 DIAGNOSIS — E1142 Type 2 diabetes mellitus with diabetic polyneuropathy: Secondary | ICD-10-CM | POA: Diagnosis present

## 2022-04-04 DIAGNOSIS — S32592A Other specified fracture of left pubis, initial encounter for closed fracture: Secondary | ICD-10-CM | POA: Diagnosis present

## 2022-04-04 DIAGNOSIS — E039 Hypothyroidism, unspecified: Secondary | ICD-10-CM | POA: Diagnosis present

## 2022-04-04 DIAGNOSIS — E785 Hyperlipidemia, unspecified: Secondary | ICD-10-CM | POA: Diagnosis present

## 2022-04-04 DIAGNOSIS — I10 Essential (primary) hypertension: Secondary | ICD-10-CM | POA: Diagnosis present

## 2022-04-04 DIAGNOSIS — S72002A Fracture of unspecified part of neck of left femur, initial encounter for closed fracture: Secondary | ICD-10-CM | POA: Diagnosis present

## 2022-04-04 DIAGNOSIS — Z9049 Acquired absence of other specified parts of digestive tract: Secondary | ICD-10-CM | POA: Diagnosis not present

## 2022-04-04 DIAGNOSIS — I951 Orthostatic hypotension: Secondary | ICD-10-CM | POA: Diagnosis present

## 2022-04-04 DIAGNOSIS — M5431 Sciatica, right side: Secondary | ICD-10-CM | POA: Diagnosis present

## 2022-04-04 DIAGNOSIS — Y92410 Unspecified street and highway as the place of occurrence of the external cause: Secondary | ICD-10-CM | POA: Diagnosis not present

## 2022-04-04 DIAGNOSIS — Z79890 Hormone replacement therapy (postmenopausal): Secondary | ICD-10-CM | POA: Diagnosis not present

## 2022-04-04 DIAGNOSIS — Z888 Allergy status to other drugs, medicaments and biological substances status: Secondary | ICD-10-CM | POA: Diagnosis not present

## 2022-04-04 DIAGNOSIS — Z881 Allergy status to other antibiotic agents status: Secondary | ICD-10-CM | POA: Diagnosis not present

## 2022-04-04 DIAGNOSIS — Z79899 Other long term (current) drug therapy: Secondary | ICD-10-CM | POA: Diagnosis not present

## 2022-04-04 DIAGNOSIS — Z8616 Personal history of COVID-19: Secondary | ICD-10-CM | POA: Diagnosis not present

## 2022-04-04 LAB — GLUCOSE, CAPILLARY
Glucose-Capillary: 125 mg/dL — ABNORMAL HIGH (ref 70–99)
Glucose-Capillary: 131 mg/dL — ABNORMAL HIGH (ref 70–99)
Glucose-Capillary: 191 mg/dL — ABNORMAL HIGH (ref 70–99)
Glucose-Capillary: 141 mg/dL — ABNORMAL HIGH (ref 70–99)
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
## 2022-04-04 NOTE — TOC Transition Note (Signed)
Transition of Care [facility]) - CM/SW Discharge Note   Patient Details  Name: [name] MRN: [id number] Date of Birth: 31-Aug-1934  Transition of Care [facility]) CM/SW Contact:  Zenon Mayo, RN Phone Number: 04/04/2022, 2:21 PM   Clinical Narrative:    Patient for possible dc today, DME has been delivered to patient's room.  Per Daughter they are not sure if she will dc today, due to orthostatics.     Final next level of care: Home w Home Health Services Barriers to Discharge: Barriers Resolved   Patient Goals and CMS Choice CMS Medicare.gov Compare Post Acute Care list provided to:: Patient Choice offered to / list presented to : Patient  Discharge Placement                  Patient to be transferred to facility by: Family      Discharge Plan and Services Additional resources added to the After Visit Summary for                  DME Arranged: Bedside commode, Walker rolling DME Agency: [employer] Date DME Agency Contacted: 04/03/22 Time DME Agency Contacted: [id number] Representative spoke with at DME Agency: [place]: PT, OT [place] Agency: [place] Date [place]: 04/03/22 Time [place]: [place] Representative spoke with at [place]: [place] Determinants of Health ([place]) Interventions SDOH Screenings   Food Insecurity: No Food Insecurity (04/03/2022)  Housing: Low Risk  (04/03/2022)  Transportation Needs: No Transportation Needs (04/03/2022)  Utilities: Not At Risk (04/03/2022)  Alcohol Screen: Low Risk  (02/02/2019)  Depression (PHQ2-9): Low Risk  (06/10/2021)  Financial Resource Strain: Low Risk  (11/20/2020)  Physical Activity: Insufficiently Active (02/02/2019)  Stress: No Stress Concern Present (02/02/2019)  Tobacco Use: Low Risk  (04/03/2022)     Readmission Risk Interventions     No data to display

## 2022-04-05 LAB — GLUCOSE, CAPILLARY
Glucose-Capillary: 119 mg/dL — ABNORMAL HIGH (ref 70–99)
Glucose-Capillary: 231 mg/dL — ABNORMAL HIGH (ref 70–99)
Glucose-Capillary: 140 mg/dL — ABNORMAL HIGH (ref 70–99)
Glucose-Capillary: 187 mg/dL — ABNORMAL HIGH (ref 70–99)
Glucose-Capillary: 145 mg/dL — ABNORMAL HIGH (ref 70–99)
Glucose-Capillary: 140 mg/dL — ABNORMAL HIGH (ref 70–99)

## 2022-04-05 MED ORDER — SODIUM CHLORIDE 0.9 % IV SOLN: INTRAVENOUS | Status: DC

## 2022-04-05 NOTE — Progress Notes (Signed)
Secure chatted MD about patient's discharge readiness and barriers. Was informed the patient needed to be assessed due to his first time having the patient. Will assess by noon.   Gomez Cleverly, SWOT RN
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
## 2022-04-05 NOTE — Progress Notes (Signed)
Physical Therapy Treatment Note  Continuing work on functional mobility and activity tolerance;  (Full PT treatment note to follow)   Brief note to summarize serial BPs  Supine  116/73  HR 55  Sitting   123/95  HR 56  MAP 105  Initial standing  106/94  HR 63  MAP 100  Standing @ 46mntues  101/89  HR 61  MAP 95  Standing @ 8 minutes, and post amb 462f  109/56  HR 62  MAP 71  Standing during stair training **presyncopal symptoms, pt describes them as mild** 84/57    HR 104 MAP 64  Standing after seated rolling back to room  76/57    HR 61   MAP 64 **Presyncopal symptoms, pt describes them as mild**  Seated, feet down  98/61    HR 51  MAP 74  Will notify Deanna and Dr. DaPietro Cassis  [place]oney MarionPT[place]landffice [phone]

## 2022-04-06 LAB — GLUCOSE, CAPILLARY
Glucose-Capillary: 131 mg/dL — ABNORMAL HIGH (ref 70–99)
Glucose-Capillary: 145 mg/dL — ABNORMAL HIGH (ref 70–99)

## 2022-04-06 MED ORDER — ACETAMINOPHEN 500 MG PO TABS: 1000.0000 mg | ORAL_TABLET | Freq: Three times a day (TID) | ORAL | 0 refills | Status: AC

## 2022-04-06 MED ORDER — METHOCARBAMOL 500 MG PO TABS: 750.0000 mg | ORAL_TABLET | Freq: Three times a day (TID) | ORAL | 0 refills | Status: AC | PRN
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
## 2022-04-06 NOTE — Progress Notes (Signed)
Physical Therapy Treatment Note  Brief note to summarize serial BPs     Initial standing             114/82  HR 57  MAP 91   Standing @ approx 12-15 minutes, post amb 100 ft             93/66  HR 62  MAP 76   Standing @ 8 minutes, post stair negotiation**presyncopal symptoms, "tired, weak"**               94/67  HR 62  MAP 71  Seated post hallway amb, stair negotiation**presyncopal symptoms, "tired, weak, tired behind my eyes"**  118/80  HR 56  MAP 91   Overall these numbers show improvement in activity tolerance;  Emphasized to pt and daughter the need to sit down if she has presyncopal symptoms;  Highly recommend following up with pt's PCP;  Dr. Pietro Cassis notified;   Roney Marion, [place] Office [phone]
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
## 2022-04-07 ENCOUNTER — Telehealth: Payer: Self-pay | Admitting: *Deleted
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
## 2022-04-09 ENCOUNTER — Ambulatory Visit (INDEPENDENT_AMBULATORY_CARE_PROVIDER_SITE_OTHER): Payer: Medicare Other | Admitting: Family Medicine

## 2022-04-09 ENCOUNTER — Telehealth: Payer: Self-pay | Admitting: Family Medicine

## 2022-04-09 ENCOUNTER — Encounter: Payer: Self-pay | Admitting: Family Medicine

## 2022-04-09 DIAGNOSIS — I951 Orthostatic hypotension: Secondary | ICD-10-CM

## 2022-04-09 DIAGNOSIS — R1013 Epigastric pain: Secondary | ICD-10-CM | POA: Diagnosis not present

## 2022-04-09 DIAGNOSIS — R5382 Chronic fatigue, unspecified: Secondary | ICD-10-CM | POA: Diagnosis not present

## 2022-04-09 DIAGNOSIS — T148XXA Other injury of unspecified body region, initial encounter: Secondary | ICD-10-CM | POA: Diagnosis not present

## 2022-04-09 DIAGNOSIS — S32009A Unspecified fracture of unspecified lumbar vertebra, initial encounter for closed fracture: Secondary | ICD-10-CM | POA: Diagnosis not present

## 2022-04-09 DIAGNOSIS — S32592A Other specified fracture of left pubis, initial encounter for closed fracture: Secondary | ICD-10-CM | POA: Diagnosis not present

## 2022-04-09 DIAGNOSIS — I10 Essential (primary) hypertension: Secondary | ICD-10-CM | POA: Diagnosis not present

## 2022-04-09 LAB — COMPREHENSIVE METABOLIC PANEL
ALT: 30 U/L (ref 0–35)
AST: 17 U/L (ref 0–37)
Albumin: 3.9 g/dL (ref 3.5–5.2)
Alkaline Phosphatase: 61 U/L (ref 39–117)
BUN: 22 mg/dL (ref 6–23)
CO2: 29 mEq/L (ref 19–32)
Calcium: 9.6 mg/dL (ref 8.4–10.5)
Chloride: 101 mEq/L (ref 96–112)
Creatinine, Ser: 0.89 mg/dL (ref 0.40–1.20)
GFR: 59.21 mL/min — ABNORMAL LOW (ref >60.00)
Glucose, Bld: 142 mg/dL — ABNORMAL HIGH (ref 70–99)
Potassium: 4.5 mEq/L (ref 3.5–5.1)
Sodium: 139 mEq/L (ref 135–145)
Total Bilirubin: 1 mg/dL (ref 0.2–1.2)
Total Protein: 6.4 g/dL (ref 6.0–8.3)

## 2022-04-09 LAB — CBC WITH DIFFERENTIAL/PLATELET
Basophils Absolute: 0 10*3/uL (ref 0.0–0.1)
Basophils Relative: 0.3 % (ref 0.0–3.0)
Eosinophils Absolute: 0.2 10*3/uL (ref 0.0–0.7)
Eosinophils Relative: 2.6 % (ref 0.0–5.0)
HCT: 37.8 % (ref 36.0–46.0)
Hemoglobin: 12.6 g/dL (ref 12.0–15.0)
Lymphocytes Relative: 23.5 % (ref 12.0–46.0)
Lymphs Abs: 1.7 10*3/uL (ref 0.7–4.0)
MCHC: 33.3 g/dL (ref 30.0–36.0)
MCV: 91.7 fl (ref 78.0–100.0)
Monocytes Absolute: 0.5 10*3/uL (ref 0.1–1.0)
Monocytes Relative: 7.4 % (ref 3.0–12.0)
Neutro Abs: 4.8 10*3/uL (ref 1.4–7.7)
Neutrophils Relative %: 66.2 % (ref 43.0–77.0)
Platelets: 215 10*3/uL (ref 150.0–400.0)
RBC: 4.12 Mil/uL (ref 3.87–5.11)
RDW: 14.2 % (ref 11.5–15.5)
WBC: 7.3 10*3/uL (ref 4.0–10.5)

## 2022-04-09 LAB — FERRITIN: Ferritin: 248.1 ng/mL (ref 10.0–291.0)

## 2022-04-09 LAB — IBC PANEL
Iron: 70 ug/dL (ref 42–145)
Saturation Ratios: 20.8 % (ref 20.0–50.0)
TIBC: 336 ug/dL (ref 250.0–450.0)
Transferrin: 240 mg/dL (ref 212.0–360.0)

## 2022-04-09 MED ORDER — ATORVASTATIN CALCIUM 20 MG PO TABS: 20.0000 mg | ORAL_TABLET | ORAL | 0 refills | Status: DC

## 2022-04-09 NOTE — Telephone Encounter (Signed)
Prescription Request  04/09/2022  Is this a "Controlled Substance" medicine? No  LOV: 04/09/2022  What is the name of the medication or equipment? atorvastatin (LIPITOR) 20 MG tablet  Have you contacted your pharmacy to request a refill? No   Which pharmacy would you like this sent to?  [facility] [place], [place] AT [facility] OF [address] [place] [address] Phone: [phone] Fax: [phone]   Patient is out of medication and needs it to go to [facility], not mail order pharmacy  Patient notified that their request is being sent to the clinical staff for review and that they should receive a response within 2 business days.   Please advise at Mobile [phone] (mobile)

## 2022-04-09 NOTE — Patient Instructions (Addendum)
[facility] We will refer you to [facility] in [place]. Stay off amlodipine and losartan, continue atenolol, keep an eye on blood pressure.  Suba frequencia de omeprazole '40mg'$  a 3 veces a la semana.  Siga medicinas para dolor incluyendo oxycodona y robaxin relajante muscular. Siga tylenol '500mg'$  1-2 pastillas 2 veces al dia. Siga tomando algo para fucion del estomago mientas tome oxycodona.  Regresar en 1 mes para seguimiento.

## 2022-04-09 NOTE — Telephone Encounter (Signed)
Refill left on vm at pharmacy.  
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
## 2022-04-10 ENCOUNTER — Encounter: Payer: Self-pay | Admitting: Family Medicine

## 2022-04-10 NOTE — Assessment & Plan Note (Signed)
Will refer to ortho to follow along.

## 2022-04-10 NOTE — Assessment & Plan Note (Addendum)
Marked orthostasis noted while working with hospital PT - however this was not noted until 10 minutes into ambulatory activity/exercise. Orthostatics negative after 3 minutes standing. Will start by dropping antihypertensives as above. If ongoing symptoms, low threshold to further evaluate for autonomic instability. She denies significant memory impairment, stiffness/rigidity, tremors or other parkinson symptoms.  Continue compression stockings which have been helpful. Continue good hydration status.  Caution with prolonged opiate use.

## 2022-04-10 NOTE — Assessment & Plan Note (Signed)
See below re: recently noted orthostasis and possible autonomic instability.  Will continue atenolol '50mg'$  daily, continue to hold losartan '100mg'$  and amlodipine '5mg'$ .

## 2022-04-10 NOTE — Assessment & Plan Note (Signed)
Update CBC to ensure no ongoing blood loss.

## 2022-04-10 NOTE — Assessment & Plan Note (Signed)
Notes recurring reflux, band like pressure to upper abdomen radiating to thoracic back, possible worsening GERD - will restart omeprazole QOD, update with effect.
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
## 2022-04-10 NOTE — Assessment & Plan Note (Signed)
Will refer to ortho to follow along. Planning to establish care with HHPT next week. Continues walker use.  Continue current pain regimen.

## 2022-04-12 ENCOUNTER — Telehealth: Payer: Self-pay | Admitting: Family Medicine

## 2022-04-12 ENCOUNTER — Encounter: Payer: Self-pay | Admitting: *Deleted

## 2022-04-12 DIAGNOSIS — E1151 Type 2 diabetes mellitus with diabetic peripheral angiopathy without gangrene: Secondary | ICD-10-CM | POA: Diagnosis not present

## 2022-04-12 DIAGNOSIS — E039 Hypothyroidism, unspecified: Secondary | ICD-10-CM | POA: Diagnosis not present

## 2022-04-12 DIAGNOSIS — G478 Other sleep disorders: Secondary | ICD-10-CM | POA: Diagnosis not present

## 2022-04-12 DIAGNOSIS — E785 Hyperlipidemia, unspecified: Secondary | ICD-10-CM | POA: Diagnosis not present

## 2022-04-12 DIAGNOSIS — I951 Orthostatic hypotension: Secondary | ICD-10-CM | POA: Diagnosis not present

## 2022-04-12 DIAGNOSIS — M25562 Pain in left knee: Secondary | ICD-10-CM | POA: Diagnosis not present

## 2022-04-12 DIAGNOSIS — M25561 Pain in right knee: Secondary | ICD-10-CM | POA: Diagnosis not present

## 2022-04-12 DIAGNOSIS — S32592D Other specified fracture of left pubis, subsequent encounter for fracture with routine healing: Secondary | ICD-10-CM | POA: Diagnosis not present

## 2022-04-12 DIAGNOSIS — H9193 Unspecified hearing loss, bilateral: Secondary | ICD-10-CM | POA: Diagnosis not present

## 2022-04-12 DIAGNOSIS — M5415 Radiculopathy, thoracolumbar region: Secondary | ICD-10-CM | POA: Diagnosis not present

## 2022-04-12 DIAGNOSIS — S32058D Other fracture of fifth lumbar vertebra, subsequent encounter for fracture with routine healing: Secondary | ICD-10-CM | POA: Diagnosis not present

## 2022-04-12 DIAGNOSIS — G8929 Other chronic pain: Secondary | ICD-10-CM | POA: Diagnosis not present

## 2022-04-12 DIAGNOSIS — J309 Allergic rhinitis, unspecified: Secondary | ICD-10-CM | POA: Diagnosis not present

## 2022-04-12 DIAGNOSIS — E1136 Type 2 diabetes mellitus with diabetic cataract: Secondary | ICD-10-CM | POA: Diagnosis not present

## 2022-04-12 DIAGNOSIS — S32048D Other fracture of fourth lumbar vertebra, subsequent encounter for fracture with routine healing: Secondary | ICD-10-CM | POA: Diagnosis not present

## 2022-04-12 DIAGNOSIS — I8311 Varicose veins of right lower extremity with inflammation: Secondary | ICD-10-CM | POA: Diagnosis not present

## 2022-04-12 DIAGNOSIS — K760 Fatty (change of) liver, not elsewhere classified: Secondary | ICD-10-CM | POA: Diagnosis not present

## 2022-04-12 DIAGNOSIS — M1990 Unspecified osteoarthritis, unspecified site: Secondary | ICD-10-CM | POA: Diagnosis not present

## 2022-04-12 DIAGNOSIS — M25511 Pain in right shoulder: Secondary | ICD-10-CM | POA: Diagnosis not present

## 2022-04-12 DIAGNOSIS — H409 Unspecified glaucoma: Secondary | ICD-10-CM | POA: Diagnosis not present

## 2022-04-12 DIAGNOSIS — M791 Myalgia, unspecified site: Secondary | ICD-10-CM | POA: Diagnosis not present

## 2022-04-12 DIAGNOSIS — I10 Essential (primary) hypertension: Secondary | ICD-10-CM | POA: Diagnosis not present

## 2022-04-12 DIAGNOSIS — M8580 Other specified disorders of bone density and structure, unspecified site: Secondary | ICD-10-CM | POA: Diagnosis not present

## 2022-04-12 DIAGNOSIS — E1142 Type 2 diabetes mellitus with diabetic polyneuropathy: Secondary | ICD-10-CM | POA: Diagnosis not present

## 2022-04-12 NOTE — Telephone Encounter (Signed)
Home Health verbal orders Caller Name: [place] Name: [facility] number: [phone], secured   Requesting OT/PT/Skilled nursing/Social Work/Speech:PT   Reason:  Frequency:2w4, 1w4  Please forward to [facility] pool or providers CMA

## 2022-04-12 NOTE — Telephone Encounter (Signed)
Agree with this. Thanks.  

## 2022-04-13 ENCOUNTER — Ambulatory Visit: Payer: Self-pay

## 2022-04-13 ENCOUNTER — Telehealth: Payer: Self-pay | Admitting: Family Medicine

## 2022-04-13 NOTE — Telephone Encounter (Signed)
Spoke with [name] informing him Dr. Darnell Level is giving verbal orders for services requested. Expresses his thanks for the call back.

## 2022-04-13 NOTE — Chronic Care Management (AMB) (Addendum)
   04/13/2022  [name] April 03, 1934 [id number]   Reason for Encounter: Change in [place] RN Care Manager/Chronic Care Management [phone]

## 2022-04-13 NOTE — Telephone Encounter (Signed)
Patient saw in her mychart that it looked like she had saw [name] for an appointment today but she said she never saw anyone. She would like a call back at [phone] to clear this up.

## 2022-04-15 DIAGNOSIS — M1990 Unspecified osteoarthritis, unspecified site: Secondary | ICD-10-CM | POA: Diagnosis not present

## 2022-04-15 DIAGNOSIS — H409 Unspecified glaucoma: Secondary | ICD-10-CM | POA: Diagnosis not present

## 2022-04-15 DIAGNOSIS — M25561 Pain in right knee: Secondary | ICD-10-CM | POA: Diagnosis not present

## 2022-04-15 DIAGNOSIS — E785 Hyperlipidemia, unspecified: Secondary | ICD-10-CM | POA: Diagnosis not present

## 2022-04-15 DIAGNOSIS — I8311 Varicose veins of right lower extremity with inflammation: Secondary | ICD-10-CM | POA: Diagnosis not present

## 2022-04-15 DIAGNOSIS — G478 Other sleep disorders: Secondary | ICD-10-CM | POA: Diagnosis not present

## 2022-04-15 DIAGNOSIS — M25511 Pain in right shoulder: Secondary | ICD-10-CM | POA: Diagnosis not present

## 2022-04-15 DIAGNOSIS — M25562 Pain in left knee: Secondary | ICD-10-CM | POA: Diagnosis not present

## 2022-04-15 DIAGNOSIS — M791 Myalgia, unspecified site: Secondary | ICD-10-CM | POA: Diagnosis not present

## 2022-04-15 DIAGNOSIS — S32592D Other specified fracture of left pubis, subsequent encounter for fracture with routine healing: Secondary | ICD-10-CM | POA: Diagnosis not present

## 2022-04-15 DIAGNOSIS — E1151 Type 2 diabetes mellitus with diabetic peripheral angiopathy without gangrene: Secondary | ICD-10-CM | POA: Diagnosis not present

## 2022-04-15 DIAGNOSIS — H9193 Unspecified hearing loss, bilateral: Secondary | ICD-10-CM | POA: Diagnosis not present

## 2022-04-15 DIAGNOSIS — I10 Essential (primary) hypertension: Secondary | ICD-10-CM | POA: Diagnosis not present

## 2022-04-15 DIAGNOSIS — K760 Fatty (change of) liver, not elsewhere classified: Secondary | ICD-10-CM | POA: Diagnosis not present

## 2022-04-15 DIAGNOSIS — E1136 Type 2 diabetes mellitus with diabetic cataract: Secondary | ICD-10-CM | POA: Diagnosis not present

## 2022-04-15 DIAGNOSIS — E039 Hypothyroidism, unspecified: Secondary | ICD-10-CM | POA: Diagnosis not present

## 2022-04-15 DIAGNOSIS — E1142 Type 2 diabetes mellitus with diabetic polyneuropathy: Secondary | ICD-10-CM | POA: Diagnosis not present

## 2022-04-15 DIAGNOSIS — S32058D Other fracture of fifth lumbar vertebra, subsequent encounter for fracture with routine healing: Secondary | ICD-10-CM | POA: Diagnosis not present

## 2022-04-15 DIAGNOSIS — S32048D Other fracture of fourth lumbar vertebra, subsequent encounter for fracture with routine healing: Secondary | ICD-10-CM | POA: Diagnosis not present

## 2022-04-15 DIAGNOSIS — J309 Allergic rhinitis, unspecified: Secondary | ICD-10-CM | POA: Diagnosis not present

## 2022-04-15 DIAGNOSIS — M5415 Radiculopathy, thoracolumbar region: Secondary | ICD-10-CM | POA: Diagnosis not present

## 2022-04-15 DIAGNOSIS — M8580 Other specified disorders of bone density and structure, unspecified site: Secondary | ICD-10-CM | POA: Diagnosis not present

## 2022-04-15 DIAGNOSIS — I951 Orthostatic hypotension: Secondary | ICD-10-CM | POA: Diagnosis not present

## 2022-04-15 DIAGNOSIS — G8929 Other chronic pain: Secondary | ICD-10-CM | POA: Diagnosis not present

## 2022-04-20 ENCOUNTER — Telehealth: Payer: Self-pay | Admitting: Family Medicine

## 2022-04-20 DIAGNOSIS — M791 Myalgia, unspecified site: Secondary | ICD-10-CM | POA: Diagnosis not present

## 2022-04-20 DIAGNOSIS — M25561 Pain in right knee: Secondary | ICD-10-CM | POA: Diagnosis not present

## 2022-04-20 DIAGNOSIS — S32048D Other fracture of fourth lumbar vertebra, subsequent encounter for fracture with routine healing: Secondary | ICD-10-CM | POA: Diagnosis not present

## 2022-04-20 DIAGNOSIS — M25562 Pain in left knee: Secondary | ICD-10-CM | POA: Diagnosis not present

## 2022-04-20 DIAGNOSIS — G478 Other sleep disorders: Secondary | ICD-10-CM | POA: Diagnosis not present

## 2022-04-20 DIAGNOSIS — S32058D Other fracture of fifth lumbar vertebra, subsequent encounter for fracture with routine healing: Secondary | ICD-10-CM | POA: Diagnosis not present

## 2022-04-20 DIAGNOSIS — I10 Essential (primary) hypertension: Secondary | ICD-10-CM | POA: Diagnosis not present

## 2022-04-20 DIAGNOSIS — J309 Allergic rhinitis, unspecified: Secondary | ICD-10-CM | POA: Diagnosis not present

## 2022-04-20 DIAGNOSIS — E785 Hyperlipidemia, unspecified: Secondary | ICD-10-CM | POA: Diagnosis not present

## 2022-04-20 DIAGNOSIS — M1990 Unspecified osteoarthritis, unspecified site: Secondary | ICD-10-CM | POA: Diagnosis not present

## 2022-04-20 DIAGNOSIS — E1136 Type 2 diabetes mellitus with diabetic cataract: Secondary | ICD-10-CM | POA: Diagnosis not present

## 2022-04-20 DIAGNOSIS — M8580 Other specified disorders of bone density and structure, unspecified site: Secondary | ICD-10-CM | POA: Diagnosis not present

## 2022-04-20 DIAGNOSIS — H9193 Unspecified hearing loss, bilateral: Secondary | ICD-10-CM | POA: Diagnosis not present

## 2022-04-20 DIAGNOSIS — M5415 Radiculopathy, thoracolumbar region: Secondary | ICD-10-CM | POA: Diagnosis not present

## 2022-04-20 DIAGNOSIS — H409 Unspecified glaucoma: Secondary | ICD-10-CM | POA: Diagnosis not present

## 2022-04-20 DIAGNOSIS — G8929 Other chronic pain: Secondary | ICD-10-CM | POA: Diagnosis not present

## 2022-04-20 DIAGNOSIS — I8311 Varicose veins of right lower extremity with inflammation: Secondary | ICD-10-CM | POA: Diagnosis not present

## 2022-04-20 DIAGNOSIS — S32592D Other specified fracture of left pubis, subsequent encounter for fracture with routine healing: Secondary | ICD-10-CM | POA: Diagnosis not present

## 2022-04-20 DIAGNOSIS — E1151 Type 2 diabetes mellitus with diabetic peripheral angiopathy without gangrene: Secondary | ICD-10-CM | POA: Diagnosis not present

## 2022-04-20 DIAGNOSIS — E1142 Type 2 diabetes mellitus with diabetic polyneuropathy: Secondary | ICD-10-CM | POA: Diagnosis not present

## 2022-04-20 DIAGNOSIS — E039 Hypothyroidism, unspecified: Secondary | ICD-10-CM | POA: Diagnosis not present

## 2022-04-20 DIAGNOSIS — K760 Fatty (change of) liver, not elsewhere classified: Secondary | ICD-10-CM | POA: Diagnosis not present

## 2022-04-20 DIAGNOSIS — M25511 Pain in right shoulder: Secondary | ICD-10-CM | POA: Diagnosis not present

## 2022-04-20 DIAGNOSIS — I951 Orthostatic hypotension: Secondary | ICD-10-CM | POA: Diagnosis not present

## 2022-04-20 NOTE — Telephone Encounter (Signed)
Spoke with [place] her Dr. Darnell Level is giving verbal orders for services requested.

## 2022-04-20 NOTE — Telephone Encounter (Signed)
Agree with this. Thanks.  
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
## 2022-04-21 DIAGNOSIS — M8580 Other specified disorders of bone density and structure, unspecified site: Secondary | ICD-10-CM | POA: Diagnosis not present

## 2022-04-21 DIAGNOSIS — E039 Hypothyroidism, unspecified: Secondary | ICD-10-CM | POA: Diagnosis not present

## 2022-04-21 DIAGNOSIS — E785 Hyperlipidemia, unspecified: Secondary | ICD-10-CM | POA: Diagnosis not present

## 2022-04-21 DIAGNOSIS — E1151 Type 2 diabetes mellitus with diabetic peripheral angiopathy without gangrene: Secondary | ICD-10-CM | POA: Diagnosis not present

## 2022-04-21 DIAGNOSIS — S32048D Other fracture of fourth lumbar vertebra, subsequent encounter for fracture with routine healing: Secondary | ICD-10-CM | POA: Diagnosis not present

## 2022-04-21 DIAGNOSIS — M25511 Pain in right shoulder: Secondary | ICD-10-CM | POA: Diagnosis not present

## 2022-04-21 DIAGNOSIS — M5415 Radiculopathy, thoracolumbar region: Secondary | ICD-10-CM | POA: Diagnosis not present

## 2022-04-21 DIAGNOSIS — I951 Orthostatic hypotension: Secondary | ICD-10-CM | POA: Diagnosis not present

## 2022-04-21 DIAGNOSIS — S32592D Other specified fracture of left pubis, subsequent encounter for fracture with routine healing: Secondary | ICD-10-CM | POA: Diagnosis not present

## 2022-04-21 DIAGNOSIS — K760 Fatty (change of) liver, not elsewhere classified: Secondary | ICD-10-CM | POA: Diagnosis not present

## 2022-04-21 DIAGNOSIS — M25562 Pain in left knee: Secondary | ICD-10-CM | POA: Diagnosis not present

## 2022-04-21 DIAGNOSIS — I8311 Varicose veins of right lower extremity with inflammation: Secondary | ICD-10-CM | POA: Diagnosis not present

## 2022-04-21 DIAGNOSIS — M1990 Unspecified osteoarthritis, unspecified site: Secondary | ICD-10-CM | POA: Diagnosis not present

## 2022-04-21 DIAGNOSIS — G478 Other sleep disorders: Secondary | ICD-10-CM | POA: Diagnosis not present

## 2022-04-21 DIAGNOSIS — E1142 Type 2 diabetes mellitus with diabetic polyneuropathy: Secondary | ICD-10-CM | POA: Diagnosis not present

## 2022-04-21 DIAGNOSIS — H9193 Unspecified hearing loss, bilateral: Secondary | ICD-10-CM | POA: Diagnosis not present

## 2022-04-21 DIAGNOSIS — M791 Myalgia, unspecified site: Secondary | ICD-10-CM | POA: Diagnosis not present

## 2022-04-21 DIAGNOSIS — J309 Allergic rhinitis, unspecified: Secondary | ICD-10-CM | POA: Diagnosis not present

## 2022-04-21 DIAGNOSIS — E1136 Type 2 diabetes mellitus with diabetic cataract: Secondary | ICD-10-CM | POA: Diagnosis not present

## 2022-04-21 DIAGNOSIS — S32058D Other fracture of fifth lumbar vertebra, subsequent encounter for fracture with routine healing: Secondary | ICD-10-CM | POA: Diagnosis not present

## 2022-04-21 DIAGNOSIS — G8929 Other chronic pain: Secondary | ICD-10-CM | POA: Diagnosis not present

## 2022-04-21 DIAGNOSIS — M25561 Pain in right knee: Secondary | ICD-10-CM | POA: Diagnosis not present

## 2022-04-21 DIAGNOSIS — I10 Essential (primary) hypertension: Secondary | ICD-10-CM | POA: Diagnosis not present

## 2022-04-21 DIAGNOSIS — H409 Unspecified glaucoma: Secondary | ICD-10-CM | POA: Diagnosis not present

## 2022-04-23 DIAGNOSIS — M1990 Unspecified osteoarthritis, unspecified site: Secondary | ICD-10-CM | POA: Diagnosis not present

## 2022-04-23 DIAGNOSIS — I8311 Varicose veins of right lower extremity with inflammation: Secondary | ICD-10-CM | POA: Diagnosis not present

## 2022-04-23 DIAGNOSIS — E785 Hyperlipidemia, unspecified: Secondary | ICD-10-CM | POA: Diagnosis not present

## 2022-04-23 DIAGNOSIS — M791 Myalgia, unspecified site: Secondary | ICD-10-CM | POA: Diagnosis not present

## 2022-04-23 DIAGNOSIS — M25562 Pain in left knee: Secondary | ICD-10-CM | POA: Diagnosis not present

## 2022-04-23 DIAGNOSIS — E1136 Type 2 diabetes mellitus with diabetic cataract: Secondary | ICD-10-CM | POA: Diagnosis not present

## 2022-04-23 DIAGNOSIS — E1142 Type 2 diabetes mellitus with diabetic polyneuropathy: Secondary | ICD-10-CM | POA: Diagnosis not present

## 2022-04-23 DIAGNOSIS — M8580 Other specified disorders of bone density and structure, unspecified site: Secondary | ICD-10-CM | POA: Diagnosis not present

## 2022-04-23 DIAGNOSIS — M25561 Pain in right knee: Secondary | ICD-10-CM | POA: Diagnosis not present

## 2022-04-23 DIAGNOSIS — G478 Other sleep disorders: Secondary | ICD-10-CM | POA: Diagnosis not present

## 2022-04-23 DIAGNOSIS — S32048D Other fracture of fourth lumbar vertebra, subsequent encounter for fracture with routine healing: Secondary | ICD-10-CM | POA: Diagnosis not present

## 2022-04-23 DIAGNOSIS — M5415 Radiculopathy, thoracolumbar region: Secondary | ICD-10-CM | POA: Diagnosis not present

## 2022-04-23 DIAGNOSIS — E039 Hypothyroidism, unspecified: Secondary | ICD-10-CM | POA: Diagnosis not present

## 2022-04-23 DIAGNOSIS — S32592D Other specified fracture of left pubis, subsequent encounter for fracture with routine healing: Secondary | ICD-10-CM | POA: Diagnosis not present

## 2022-04-23 DIAGNOSIS — S32058D Other fracture of fifth lumbar vertebra, subsequent encounter for fracture with routine healing: Secondary | ICD-10-CM | POA: Diagnosis not present

## 2022-04-23 DIAGNOSIS — H9193 Unspecified hearing loss, bilateral: Secondary | ICD-10-CM | POA: Diagnosis not present

## 2022-04-23 DIAGNOSIS — H409 Unspecified glaucoma: Secondary | ICD-10-CM | POA: Diagnosis not present

## 2022-04-23 DIAGNOSIS — K760 Fatty (change of) liver, not elsewhere classified: Secondary | ICD-10-CM | POA: Diagnosis not present

## 2022-04-23 DIAGNOSIS — J309 Allergic rhinitis, unspecified: Secondary | ICD-10-CM | POA: Diagnosis not present

## 2022-04-23 DIAGNOSIS — M25511 Pain in right shoulder: Secondary | ICD-10-CM | POA: Diagnosis not present

## 2022-04-23 DIAGNOSIS — G8929 Other chronic pain: Secondary | ICD-10-CM | POA: Diagnosis not present

## 2022-04-23 DIAGNOSIS — I10 Essential (primary) hypertension: Secondary | ICD-10-CM | POA: Diagnosis not present

## 2022-04-23 DIAGNOSIS — I951 Orthostatic hypotension: Secondary | ICD-10-CM | POA: Diagnosis not present

## 2022-04-23 DIAGNOSIS — E1151 Type 2 diabetes mellitus with diabetic peripheral angiopathy without gangrene: Secondary | ICD-10-CM | POA: Diagnosis not present

## 2022-04-28 DIAGNOSIS — M25561 Pain in right knee: Secondary | ICD-10-CM | POA: Diagnosis not present

## 2022-04-28 DIAGNOSIS — M5415 Radiculopathy, thoracolumbar region: Secondary | ICD-10-CM | POA: Diagnosis not present

## 2022-04-28 DIAGNOSIS — G8929 Other chronic pain: Secondary | ICD-10-CM | POA: Diagnosis not present

## 2022-04-28 DIAGNOSIS — M25562 Pain in left knee: Secondary | ICD-10-CM | POA: Diagnosis not present

## 2022-04-28 DIAGNOSIS — S32592D Other specified fracture of left pubis, subsequent encounter for fracture with routine healing: Secondary | ICD-10-CM | POA: Diagnosis not present

## 2022-04-28 DIAGNOSIS — H409 Unspecified glaucoma: Secondary | ICD-10-CM | POA: Diagnosis not present

## 2022-04-28 DIAGNOSIS — S32048D Other fracture of fourth lumbar vertebra, subsequent encounter for fracture with routine healing: Secondary | ICD-10-CM | POA: Diagnosis not present

## 2022-04-28 DIAGNOSIS — K760 Fatty (change of) liver, not elsewhere classified: Secondary | ICD-10-CM | POA: Diagnosis not present

## 2022-04-28 DIAGNOSIS — J309 Allergic rhinitis, unspecified: Secondary | ICD-10-CM | POA: Diagnosis not present

## 2022-04-28 DIAGNOSIS — E1151 Type 2 diabetes mellitus with diabetic peripheral angiopathy without gangrene: Secondary | ICD-10-CM | POA: Diagnosis not present

## 2022-04-28 DIAGNOSIS — I10 Essential (primary) hypertension: Secondary | ICD-10-CM | POA: Diagnosis not present

## 2022-04-28 DIAGNOSIS — E1136 Type 2 diabetes mellitus with diabetic cataract: Secondary | ICD-10-CM | POA: Diagnosis not present

## 2022-04-28 DIAGNOSIS — M791 Myalgia, unspecified site: Secondary | ICD-10-CM | POA: Diagnosis not present

## 2022-04-28 DIAGNOSIS — E039 Hypothyroidism, unspecified: Secondary | ICD-10-CM | POA: Diagnosis not present

## 2022-04-28 DIAGNOSIS — G478 Other sleep disorders: Secondary | ICD-10-CM | POA: Diagnosis not present

## 2022-04-28 DIAGNOSIS — S32058D Other fracture of fifth lumbar vertebra, subsequent encounter for fracture with routine healing: Secondary | ICD-10-CM | POA: Diagnosis not present

## 2022-04-28 DIAGNOSIS — I951 Orthostatic hypotension: Secondary | ICD-10-CM | POA: Diagnosis not present

## 2022-04-28 DIAGNOSIS — I8311 Varicose veins of right lower extremity with inflammation: Secondary | ICD-10-CM | POA: Diagnosis not present

## 2022-04-28 DIAGNOSIS — E1142 Type 2 diabetes mellitus with diabetic polyneuropathy: Secondary | ICD-10-CM | POA: Diagnosis not present

## 2022-04-28 DIAGNOSIS — M1990 Unspecified osteoarthritis, unspecified site: Secondary | ICD-10-CM | POA: Diagnosis not present

## 2022-04-28 DIAGNOSIS — M25511 Pain in right shoulder: Secondary | ICD-10-CM | POA: Diagnosis not present

## 2022-04-28 DIAGNOSIS — H9193 Unspecified hearing loss, bilateral: Secondary | ICD-10-CM | POA: Diagnosis not present

## 2022-04-28 DIAGNOSIS — M8580 Other specified disorders of bone density and structure, unspecified site: Secondary | ICD-10-CM | POA: Diagnosis not present

## 2022-04-28 DIAGNOSIS — E785 Hyperlipidemia, unspecified: Secondary | ICD-10-CM | POA: Diagnosis not present

## 2022-04-30 DIAGNOSIS — M25562 Pain in left knee: Secondary | ICD-10-CM | POA: Diagnosis not present

## 2022-04-30 DIAGNOSIS — E1151 Type 2 diabetes mellitus with diabetic peripheral angiopathy without gangrene: Secondary | ICD-10-CM | POA: Diagnosis not present

## 2022-04-30 DIAGNOSIS — S32048D Other fracture of fourth lumbar vertebra, subsequent encounter for fracture with routine healing: Secondary | ICD-10-CM | POA: Diagnosis not present

## 2022-04-30 DIAGNOSIS — M1990 Unspecified osteoarthritis, unspecified site: Secondary | ICD-10-CM | POA: Diagnosis not present

## 2022-04-30 DIAGNOSIS — E785 Hyperlipidemia, unspecified: Secondary | ICD-10-CM | POA: Diagnosis not present

## 2022-04-30 DIAGNOSIS — I8311 Varicose veins of right lower extremity with inflammation: Secondary | ICD-10-CM | POA: Diagnosis not present

## 2022-04-30 DIAGNOSIS — M25511 Pain in right shoulder: Secondary | ICD-10-CM | POA: Diagnosis not present

## 2022-04-30 DIAGNOSIS — S32058D Other fracture of fifth lumbar vertebra, subsequent encounter for fracture with routine healing: Secondary | ICD-10-CM | POA: Diagnosis not present

## 2022-04-30 DIAGNOSIS — E1142 Type 2 diabetes mellitus with diabetic polyneuropathy: Secondary | ICD-10-CM | POA: Diagnosis not present

## 2022-04-30 DIAGNOSIS — M25561 Pain in right knee: Secondary | ICD-10-CM | POA: Diagnosis not present

## 2022-04-30 DIAGNOSIS — J309 Allergic rhinitis, unspecified: Secondary | ICD-10-CM | POA: Diagnosis not present

## 2022-04-30 DIAGNOSIS — G478 Other sleep disorders: Secondary | ICD-10-CM | POA: Diagnosis not present

## 2022-04-30 DIAGNOSIS — M5415 Radiculopathy, thoracolumbar region: Secondary | ICD-10-CM | POA: Diagnosis not present

## 2022-04-30 DIAGNOSIS — I10 Essential (primary) hypertension: Secondary | ICD-10-CM | POA: Diagnosis not present

## 2022-04-30 DIAGNOSIS — M791 Myalgia, unspecified site: Secondary | ICD-10-CM | POA: Diagnosis not present

## 2022-04-30 DIAGNOSIS — H409 Unspecified glaucoma: Secondary | ICD-10-CM | POA: Diagnosis not present

## 2022-04-30 DIAGNOSIS — I951 Orthostatic hypotension: Secondary | ICD-10-CM | POA: Diagnosis not present

## 2022-04-30 DIAGNOSIS — S32592D Other specified fracture of left pubis, subsequent encounter for fracture with routine healing: Secondary | ICD-10-CM | POA: Diagnosis not present

## 2022-04-30 DIAGNOSIS — G8929 Other chronic pain: Secondary | ICD-10-CM | POA: Diagnosis not present

## 2022-04-30 DIAGNOSIS — M8580 Other specified disorders of bone density and structure, unspecified site: Secondary | ICD-10-CM | POA: Diagnosis not present

## 2022-04-30 DIAGNOSIS — H9193 Unspecified hearing loss, bilateral: Secondary | ICD-10-CM | POA: Diagnosis not present

## 2022-04-30 DIAGNOSIS — E039 Hypothyroidism, unspecified: Secondary | ICD-10-CM | POA: Diagnosis not present

## 2022-04-30 DIAGNOSIS — K760 Fatty (change of) liver, not elsewhere classified: Secondary | ICD-10-CM | POA: Diagnosis not present

## 2022-04-30 DIAGNOSIS — E1136 Type 2 diabetes mellitus with diabetic cataract: Secondary | ICD-10-CM | POA: Diagnosis not present

## 2022-05-04 DIAGNOSIS — M25511 Pain in right shoulder: Secondary | ICD-10-CM | POA: Diagnosis not present

## 2022-05-04 DIAGNOSIS — K760 Fatty (change of) liver, not elsewhere classified: Secondary | ICD-10-CM | POA: Diagnosis not present

## 2022-05-04 DIAGNOSIS — G8929 Other chronic pain: Secondary | ICD-10-CM | POA: Diagnosis not present

## 2022-05-04 DIAGNOSIS — M25561 Pain in right knee: Secondary | ICD-10-CM | POA: Diagnosis not present

## 2022-05-04 DIAGNOSIS — M791 Myalgia, unspecified site: Secondary | ICD-10-CM | POA: Diagnosis not present

## 2022-05-04 DIAGNOSIS — I951 Orthostatic hypotension: Secondary | ICD-10-CM | POA: Diagnosis not present

## 2022-05-04 DIAGNOSIS — E1142 Type 2 diabetes mellitus with diabetic polyneuropathy: Secondary | ICD-10-CM | POA: Diagnosis not present

## 2022-05-04 DIAGNOSIS — S32058D Other fracture of fifth lumbar vertebra, subsequent encounter for fracture with routine healing: Secondary | ICD-10-CM | POA: Diagnosis not present

## 2022-05-04 DIAGNOSIS — M25562 Pain in left knee: Secondary | ICD-10-CM | POA: Diagnosis not present

## 2022-05-04 DIAGNOSIS — E039 Hypothyroidism, unspecified: Secondary | ICD-10-CM | POA: Diagnosis not present

## 2022-05-04 DIAGNOSIS — G478 Other sleep disorders: Secondary | ICD-10-CM | POA: Diagnosis not present

## 2022-05-04 DIAGNOSIS — S32592D Other specified fracture of left pubis, subsequent encounter for fracture with routine healing: Secondary | ICD-10-CM | POA: Diagnosis not present

## 2022-05-04 DIAGNOSIS — M1990 Unspecified osteoarthritis, unspecified site: Secondary | ICD-10-CM | POA: Diagnosis not present

## 2022-05-04 DIAGNOSIS — M8580 Other specified disorders of bone density and structure, unspecified site: Secondary | ICD-10-CM | POA: Diagnosis not present

## 2022-05-04 DIAGNOSIS — E785 Hyperlipidemia, unspecified: Secondary | ICD-10-CM | POA: Diagnosis not present

## 2022-05-04 DIAGNOSIS — H9193 Unspecified hearing loss, bilateral: Secondary | ICD-10-CM | POA: Diagnosis not present

## 2022-05-04 DIAGNOSIS — M5415 Radiculopathy, thoracolumbar region: Secondary | ICD-10-CM | POA: Diagnosis not present

## 2022-05-04 DIAGNOSIS — I10 Essential (primary) hypertension: Secondary | ICD-10-CM | POA: Diagnosis not present

## 2022-05-04 DIAGNOSIS — E1136 Type 2 diabetes mellitus with diabetic cataract: Secondary | ICD-10-CM | POA: Diagnosis not present

## 2022-05-04 DIAGNOSIS — J309 Allergic rhinitis, unspecified: Secondary | ICD-10-CM | POA: Diagnosis not present

## 2022-05-04 DIAGNOSIS — E1151 Type 2 diabetes mellitus with diabetic peripheral angiopathy without gangrene: Secondary | ICD-10-CM | POA: Diagnosis not present

## 2022-05-04 DIAGNOSIS — I8311 Varicose veins of right lower extremity with inflammation: Secondary | ICD-10-CM | POA: Diagnosis not present

## 2022-05-04 DIAGNOSIS — H409 Unspecified glaucoma: Secondary | ICD-10-CM | POA: Diagnosis not present

## 2022-05-04 DIAGNOSIS — S32048D Other fracture of fourth lumbar vertebra, subsequent encounter for fracture with routine healing: Secondary | ICD-10-CM | POA: Diagnosis not present

## 2022-05-05 DIAGNOSIS — E1136 Type 2 diabetes mellitus with diabetic cataract: Secondary | ICD-10-CM | POA: Diagnosis not present

## 2022-05-05 DIAGNOSIS — M8580 Other specified disorders of bone density and structure, unspecified site: Secondary | ICD-10-CM | POA: Diagnosis not present

## 2022-05-05 DIAGNOSIS — M791 Myalgia, unspecified site: Secondary | ICD-10-CM | POA: Diagnosis not present

## 2022-05-05 DIAGNOSIS — G8929 Other chronic pain: Secondary | ICD-10-CM | POA: Diagnosis not present

## 2022-05-05 DIAGNOSIS — M5415 Radiculopathy, thoracolumbar region: Secondary | ICD-10-CM | POA: Diagnosis not present

## 2022-05-05 DIAGNOSIS — E1151 Type 2 diabetes mellitus with diabetic peripheral angiopathy without gangrene: Secondary | ICD-10-CM | POA: Diagnosis not present

## 2022-05-05 DIAGNOSIS — E1142 Type 2 diabetes mellitus with diabetic polyneuropathy: Secondary | ICD-10-CM | POA: Diagnosis not present

## 2022-05-05 DIAGNOSIS — I10 Essential (primary) hypertension: Secondary | ICD-10-CM | POA: Diagnosis not present

## 2022-05-05 DIAGNOSIS — G478 Other sleep disorders: Secondary | ICD-10-CM | POA: Diagnosis not present

## 2022-05-05 DIAGNOSIS — M25561 Pain in right knee: Secondary | ICD-10-CM | POA: Diagnosis not present

## 2022-05-05 DIAGNOSIS — H409 Unspecified glaucoma: Secondary | ICD-10-CM | POA: Diagnosis not present

## 2022-05-05 DIAGNOSIS — M25511 Pain in right shoulder: Secondary | ICD-10-CM | POA: Diagnosis not present

## 2022-05-05 DIAGNOSIS — M1990 Unspecified osteoarthritis, unspecified site: Secondary | ICD-10-CM | POA: Diagnosis not present

## 2022-05-05 DIAGNOSIS — E039 Hypothyroidism, unspecified: Secondary | ICD-10-CM | POA: Diagnosis not present

## 2022-05-05 DIAGNOSIS — J309 Allergic rhinitis, unspecified: Secondary | ICD-10-CM | POA: Diagnosis not present

## 2022-05-05 DIAGNOSIS — I8311 Varicose veins of right lower extremity with inflammation: Secondary | ICD-10-CM | POA: Diagnosis not present

## 2022-05-05 DIAGNOSIS — S32048D Other fracture of fourth lumbar vertebra, subsequent encounter for fracture with routine healing: Secondary | ICD-10-CM | POA: Diagnosis not present

## 2022-05-05 DIAGNOSIS — H9193 Unspecified hearing loss, bilateral: Secondary | ICD-10-CM | POA: Diagnosis not present

## 2022-05-05 DIAGNOSIS — M25562 Pain in left knee: Secondary | ICD-10-CM | POA: Diagnosis not present

## 2022-05-05 DIAGNOSIS — K760 Fatty (change of) liver, not elsewhere classified: Secondary | ICD-10-CM | POA: Diagnosis not present

## 2022-05-05 DIAGNOSIS — S32592D Other specified fracture of left pubis, subsequent encounter for fracture with routine healing: Secondary | ICD-10-CM | POA: Diagnosis not present

## 2022-05-05 DIAGNOSIS — E785 Hyperlipidemia, unspecified: Secondary | ICD-10-CM | POA: Diagnosis not present

## 2022-05-05 DIAGNOSIS — I951 Orthostatic hypotension: Secondary | ICD-10-CM | POA: Diagnosis not present

## 2022-05-05 DIAGNOSIS — S32058D Other fracture of fifth lumbar vertebra, subsequent encounter for fracture with routine healing: Secondary | ICD-10-CM | POA: Diagnosis not present

## 2022-05-07 DIAGNOSIS — M25561 Pain in right knee: Secondary | ICD-10-CM | POA: Diagnosis not present

## 2022-05-07 DIAGNOSIS — M791 Myalgia, unspecified site: Secondary | ICD-10-CM

## 2022-05-07 DIAGNOSIS — I8311 Varicose veins of right lower extremity with inflammation: Secondary | ICD-10-CM | POA: Diagnosis not present

## 2022-05-07 DIAGNOSIS — Z87440 Personal history of urinary (tract) infections: Secondary | ICD-10-CM

## 2022-05-07 DIAGNOSIS — E039 Hypothyroidism, unspecified: Secondary | ICD-10-CM

## 2022-05-07 DIAGNOSIS — E1151 Type 2 diabetes mellitus with diabetic peripheral angiopathy without gangrene: Secondary | ICD-10-CM | POA: Diagnosis not present

## 2022-05-07 DIAGNOSIS — M25562 Pain in left knee: Secondary | ICD-10-CM | POA: Diagnosis not present

## 2022-05-07 DIAGNOSIS — E785 Hyperlipidemia, unspecified: Secondary | ICD-10-CM

## 2022-05-07 DIAGNOSIS — G478 Other sleep disorders: Secondary | ICD-10-CM

## 2022-05-07 DIAGNOSIS — M25511 Pain in right shoulder: Secondary | ICD-10-CM | POA: Diagnosis not present

## 2022-05-07 DIAGNOSIS — Z8616 Personal history of COVID-19: Secondary | ICD-10-CM

## 2022-05-07 DIAGNOSIS — M5415 Radiculopathy, thoracolumbar region: Secondary | ICD-10-CM

## 2022-05-07 DIAGNOSIS — I10 Essential (primary) hypertension: Secondary | ICD-10-CM | POA: Diagnosis not present

## 2022-05-07 DIAGNOSIS — H9193 Unspecified hearing loss, bilateral: Secondary | ICD-10-CM

## 2022-05-07 DIAGNOSIS — G8929 Other chronic pain: Secondary | ICD-10-CM | POA: Diagnosis not present

## 2022-05-07 DIAGNOSIS — S32058D Other fracture of fifth lumbar vertebra, subsequent encounter for fracture with routine healing: Secondary | ICD-10-CM | POA: Diagnosis not present

## 2022-05-07 DIAGNOSIS — M8580 Other specified disorders of bone density and structure, unspecified site: Secondary | ICD-10-CM

## 2022-05-07 DIAGNOSIS — E1142 Type 2 diabetes mellitus with diabetic polyneuropathy: Secondary | ICD-10-CM | POA: Diagnosis not present

## 2022-05-07 DIAGNOSIS — S32592D Other specified fracture of left pubis, subsequent encounter for fracture with routine healing: Secondary | ICD-10-CM | POA: Diagnosis not present

## 2022-05-07 DIAGNOSIS — Z8619 Personal history of other infectious and parasitic diseases: Secondary | ICD-10-CM

## 2022-05-07 DIAGNOSIS — S32048D Other fracture of fourth lumbar vertebra, subsequent encounter for fracture with routine healing: Secondary | ICD-10-CM | POA: Diagnosis not present

## 2022-05-07 DIAGNOSIS — Z9181 History of falling: Secondary | ICD-10-CM

## 2022-05-07 DIAGNOSIS — E1136 Type 2 diabetes mellitus with diabetic cataract: Secondary | ICD-10-CM

## 2022-05-07 DIAGNOSIS — J309 Allergic rhinitis, unspecified: Secondary | ICD-10-CM

## 2022-05-07 DIAGNOSIS — M1990 Unspecified osteoarthritis, unspecified site: Secondary | ICD-10-CM

## 2022-05-07 DIAGNOSIS — I951 Orthostatic hypotension: Secondary | ICD-10-CM | POA: Diagnosis not present

## 2022-05-07 DIAGNOSIS — H409 Unspecified glaucoma: Secondary | ICD-10-CM

## 2022-05-07 DIAGNOSIS — K760 Fatty (change of) liver, not elsewhere classified: Secondary | ICD-10-CM

## 2022-05-10 ENCOUNTER — Encounter: Payer: Self-pay | Admitting: Family Medicine

## 2022-05-10 ENCOUNTER — Ambulatory Visit (INDEPENDENT_AMBULATORY_CARE_PROVIDER_SITE_OTHER): Payer: Medicare Other | Admitting: Family Medicine

## 2022-05-10 VITALS — BP 126/62 | HR 70 | Temp 97.6°F | Ht 62.0 in | Wt 165.0 lb

## 2022-05-10 DIAGNOSIS — M25512 Pain in left shoulder: Secondary | ICD-10-CM | POA: Diagnosis not present

## 2022-05-10 DIAGNOSIS — M2550 Pain in unspecified joint: Secondary | ICD-10-CM

## 2022-05-10 DIAGNOSIS — I951 Orthostatic hypotension: Secondary | ICD-10-CM | POA: Diagnosis not present

## 2022-05-10 DIAGNOSIS — I10 Essential (primary) hypertension: Secondary | ICD-10-CM

## 2022-05-10 DIAGNOSIS — S32592D Other specified fracture of left pubis, subsequent encounter for fracture with routine healing: Secondary | ICD-10-CM

## 2022-05-10 DIAGNOSIS — S32009A Unspecified fracture of unspecified lumbar vertebra, initial encounter for closed fracture: Secondary | ICD-10-CM | POA: Diagnosis not present

## 2022-05-10 MED ORDER — ONETOUCH ULTRA 2 W/DEVICE KIT: PACK | 0 refills | Status: DC

## 2022-05-10 NOTE — Assessment & Plan Note (Signed)
BP remains stable only on atenolol 50mg  daily, off losartan and amlodipine.

## 2022-05-10 NOTE — Patient Instructions (Addendum)
Regresar el mes entrante para fisico con laboratorios.  Gusto verla hoy. Dejeme saber si dolor empeora  [facility] Address: [address] [place], [place] [address] Phone: [phone]

## 2022-05-10 NOTE — Assessment & Plan Note (Signed)
Anticipate shoulder tendonitis - biceps and supraspinatus based on MSK exam.  Continue PRN tylenol.  Advised to discuss with [facility] PT/OT for modified exercise program.
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
## 2022-05-10 NOTE — Assessment & Plan Note (Signed)
No further orthostasis off antihypertensives losartan and amlodipine.

## 2022-05-10 NOTE — Assessment & Plan Note (Addendum)
She did not see ortho. She continues healing well with improvement in pain - except for today as per above.  Discussed if ongoing discomfort, low threshold to schedule ortho appt - contact info provided. Continue HH PT/OT.

## 2022-05-10 NOTE — Assessment & Plan Note (Addendum)
Acute episode today of worsening body aches, in setting of recent weather change ?arthritis related. Discussed tylenol use. Update if not improving with time.

## 2022-05-10 NOTE — Assessment & Plan Note (Signed)
No back pain on exam today.  Continue tylenol for pain control.

## 2022-05-12 DIAGNOSIS — I10 Essential (primary) hypertension: Secondary | ICD-10-CM | POA: Diagnosis not present

## 2022-05-12 DIAGNOSIS — M1990 Unspecified osteoarthritis, unspecified site: Secondary | ICD-10-CM | POA: Diagnosis not present

## 2022-05-12 DIAGNOSIS — M25561 Pain in right knee: Secondary | ICD-10-CM | POA: Diagnosis not present

## 2022-05-12 DIAGNOSIS — M25562 Pain in left knee: Secondary | ICD-10-CM | POA: Diagnosis not present

## 2022-05-12 DIAGNOSIS — M25511 Pain in right shoulder: Secondary | ICD-10-CM | POA: Diagnosis not present

## 2022-05-12 DIAGNOSIS — M8580 Other specified disorders of bone density and structure, unspecified site: Secondary | ICD-10-CM | POA: Diagnosis not present

## 2022-05-12 DIAGNOSIS — E785 Hyperlipidemia, unspecified: Secondary | ICD-10-CM | POA: Diagnosis not present

## 2022-05-12 DIAGNOSIS — G478 Other sleep disorders: Secondary | ICD-10-CM | POA: Diagnosis not present

## 2022-05-12 DIAGNOSIS — H9193 Unspecified hearing loss, bilateral: Secondary | ICD-10-CM | POA: Diagnosis not present

## 2022-05-12 DIAGNOSIS — E1136 Type 2 diabetes mellitus with diabetic cataract: Secondary | ICD-10-CM | POA: Diagnosis not present

## 2022-05-12 DIAGNOSIS — M791 Myalgia, unspecified site: Secondary | ICD-10-CM | POA: Diagnosis not present

## 2022-05-12 DIAGNOSIS — J309 Allergic rhinitis, unspecified: Secondary | ICD-10-CM | POA: Diagnosis not present

## 2022-05-12 DIAGNOSIS — S32592D Other specified fracture of left pubis, subsequent encounter for fracture with routine healing: Secondary | ICD-10-CM | POA: Diagnosis not present

## 2022-05-12 DIAGNOSIS — S32048D Other fracture of fourth lumbar vertebra, subsequent encounter for fracture with routine healing: Secondary | ICD-10-CM | POA: Diagnosis not present

## 2022-05-12 DIAGNOSIS — E1142 Type 2 diabetes mellitus with diabetic polyneuropathy: Secondary | ICD-10-CM | POA: Diagnosis not present

## 2022-05-12 DIAGNOSIS — I8311 Varicose veins of right lower extremity with inflammation: Secondary | ICD-10-CM | POA: Diagnosis not present

## 2022-05-12 DIAGNOSIS — E1151 Type 2 diabetes mellitus with diabetic peripheral angiopathy without gangrene: Secondary | ICD-10-CM | POA: Diagnosis not present

## 2022-05-12 DIAGNOSIS — M5415 Radiculopathy, thoracolumbar region: Secondary | ICD-10-CM | POA: Diagnosis not present

## 2022-05-12 DIAGNOSIS — S32058D Other fracture of fifth lumbar vertebra, subsequent encounter for fracture with routine healing: Secondary | ICD-10-CM | POA: Diagnosis not present

## 2022-05-12 DIAGNOSIS — I951 Orthostatic hypotension: Secondary | ICD-10-CM | POA: Diagnosis not present

## 2022-05-12 DIAGNOSIS — E039 Hypothyroidism, unspecified: Secondary | ICD-10-CM | POA: Diagnosis not present

## 2022-05-12 DIAGNOSIS — G8929 Other chronic pain: Secondary | ICD-10-CM | POA: Diagnosis not present

## 2022-05-12 DIAGNOSIS — H409 Unspecified glaucoma: Secondary | ICD-10-CM | POA: Diagnosis not present

## 2022-05-12 DIAGNOSIS — K760 Fatty (change of) liver, not elsewhere classified: Secondary | ICD-10-CM | POA: Diagnosis not present

## 2022-05-18 DIAGNOSIS — E1151 Type 2 diabetes mellitus with diabetic peripheral angiopathy without gangrene: Secondary | ICD-10-CM | POA: Diagnosis not present

## 2022-05-18 DIAGNOSIS — E1136 Type 2 diabetes mellitus with diabetic cataract: Secondary | ICD-10-CM | POA: Diagnosis not present

## 2022-05-18 DIAGNOSIS — M8580 Other specified disorders of bone density and structure, unspecified site: Secondary | ICD-10-CM | POA: Diagnosis not present

## 2022-05-18 DIAGNOSIS — K760 Fatty (change of) liver, not elsewhere classified: Secondary | ICD-10-CM | POA: Diagnosis not present

## 2022-05-18 DIAGNOSIS — S32048D Other fracture of fourth lumbar vertebra, subsequent encounter for fracture with routine healing: Secondary | ICD-10-CM | POA: Diagnosis not present

## 2022-05-18 DIAGNOSIS — E039 Hypothyroidism, unspecified: Secondary | ICD-10-CM | POA: Diagnosis not present

## 2022-05-18 DIAGNOSIS — H409 Unspecified glaucoma: Secondary | ICD-10-CM | POA: Diagnosis not present

## 2022-05-18 DIAGNOSIS — S32592D Other specified fracture of left pubis, subsequent encounter for fracture with routine healing: Secondary | ICD-10-CM | POA: Diagnosis not present

## 2022-05-18 DIAGNOSIS — H9193 Unspecified hearing loss, bilateral: Secondary | ICD-10-CM | POA: Diagnosis not present

## 2022-05-18 DIAGNOSIS — M25511 Pain in right shoulder: Secondary | ICD-10-CM | POA: Diagnosis not present

## 2022-05-18 DIAGNOSIS — M1990 Unspecified osteoarthritis, unspecified site: Secondary | ICD-10-CM | POA: Diagnosis not present

## 2022-05-18 DIAGNOSIS — J309 Allergic rhinitis, unspecified: Secondary | ICD-10-CM | POA: Diagnosis not present

## 2022-05-18 DIAGNOSIS — M25562 Pain in left knee: Secondary | ICD-10-CM | POA: Diagnosis not present

## 2022-05-18 DIAGNOSIS — E1142 Type 2 diabetes mellitus with diabetic polyneuropathy: Secondary | ICD-10-CM | POA: Diagnosis not present

## 2022-05-18 DIAGNOSIS — G8929 Other chronic pain: Secondary | ICD-10-CM | POA: Diagnosis not present

## 2022-05-18 DIAGNOSIS — I8311 Varicose veins of right lower extremity with inflammation: Secondary | ICD-10-CM | POA: Diagnosis not present

## 2022-05-18 DIAGNOSIS — I10 Essential (primary) hypertension: Secondary | ICD-10-CM | POA: Diagnosis not present

## 2022-05-18 DIAGNOSIS — S32058D Other fracture of fifth lumbar vertebra, subsequent encounter for fracture with routine healing: Secondary | ICD-10-CM | POA: Diagnosis not present

## 2022-05-18 DIAGNOSIS — M5415 Radiculopathy, thoracolumbar region: Secondary | ICD-10-CM | POA: Diagnosis not present

## 2022-05-18 DIAGNOSIS — I951 Orthostatic hypotension: Secondary | ICD-10-CM | POA: Diagnosis not present

## 2022-05-18 DIAGNOSIS — M25561 Pain in right knee: Secondary | ICD-10-CM | POA: Diagnosis not present

## 2022-05-18 DIAGNOSIS — M791 Myalgia, unspecified site: Secondary | ICD-10-CM | POA: Diagnosis not present

## 2022-05-18 DIAGNOSIS — G478 Other sleep disorders: Secondary | ICD-10-CM | POA: Diagnosis not present

## 2022-05-18 DIAGNOSIS — E785 Hyperlipidemia, unspecified: Secondary | ICD-10-CM | POA: Diagnosis not present

## 2022-05-19 DIAGNOSIS — K760 Fatty (change of) liver, not elsewhere classified: Secondary | ICD-10-CM | POA: Diagnosis not present

## 2022-05-19 DIAGNOSIS — S32058D Other fracture of fifth lumbar vertebra, subsequent encounter for fracture with routine healing: Secondary | ICD-10-CM | POA: Diagnosis not present

## 2022-05-19 DIAGNOSIS — M791 Myalgia, unspecified site: Secondary | ICD-10-CM | POA: Diagnosis not present

## 2022-05-19 DIAGNOSIS — I10 Essential (primary) hypertension: Secondary | ICD-10-CM | POA: Diagnosis not present

## 2022-05-19 DIAGNOSIS — G8929 Other chronic pain: Secondary | ICD-10-CM | POA: Diagnosis not present

## 2022-05-19 DIAGNOSIS — M25562 Pain in left knee: Secondary | ICD-10-CM | POA: Diagnosis not present

## 2022-05-19 DIAGNOSIS — S32592D Other specified fracture of left pubis, subsequent encounter for fracture with routine healing: Secondary | ICD-10-CM | POA: Diagnosis not present

## 2022-05-19 DIAGNOSIS — M25561 Pain in right knee: Secondary | ICD-10-CM | POA: Diagnosis not present

## 2022-05-19 DIAGNOSIS — E1151 Type 2 diabetes mellitus with diabetic peripheral angiopathy without gangrene: Secondary | ICD-10-CM | POA: Diagnosis not present

## 2022-05-19 DIAGNOSIS — M5415 Radiculopathy, thoracolumbar region: Secondary | ICD-10-CM | POA: Diagnosis not present

## 2022-05-19 DIAGNOSIS — E1136 Type 2 diabetes mellitus with diabetic cataract: Secondary | ICD-10-CM | POA: Diagnosis not present

## 2022-05-19 DIAGNOSIS — M8580 Other specified disorders of bone density and structure, unspecified site: Secondary | ICD-10-CM | POA: Diagnosis not present

## 2022-05-19 DIAGNOSIS — J309 Allergic rhinitis, unspecified: Secondary | ICD-10-CM | POA: Diagnosis not present

## 2022-05-19 DIAGNOSIS — S32048D Other fracture of fourth lumbar vertebra, subsequent encounter for fracture with routine healing: Secondary | ICD-10-CM | POA: Diagnosis not present

## 2022-05-19 DIAGNOSIS — H409 Unspecified glaucoma: Secondary | ICD-10-CM | POA: Diagnosis not present

## 2022-05-19 DIAGNOSIS — E1142 Type 2 diabetes mellitus with diabetic polyneuropathy: Secondary | ICD-10-CM | POA: Diagnosis not present

## 2022-05-19 DIAGNOSIS — I951 Orthostatic hypotension: Secondary | ICD-10-CM | POA: Diagnosis not present

## 2022-05-19 DIAGNOSIS — M25511 Pain in right shoulder: Secondary | ICD-10-CM | POA: Diagnosis not present

## 2022-05-19 DIAGNOSIS — H9193 Unspecified hearing loss, bilateral: Secondary | ICD-10-CM | POA: Diagnosis not present

## 2022-05-19 DIAGNOSIS — G478 Other sleep disorders: Secondary | ICD-10-CM | POA: Diagnosis not present

## 2022-05-19 DIAGNOSIS — M1990 Unspecified osteoarthritis, unspecified site: Secondary | ICD-10-CM | POA: Diagnosis not present

## 2022-05-19 DIAGNOSIS — E785 Hyperlipidemia, unspecified: Secondary | ICD-10-CM | POA: Diagnosis not present

## 2022-05-19 DIAGNOSIS — I8311 Varicose veins of right lower extremity with inflammation: Secondary | ICD-10-CM | POA: Diagnosis not present

## 2022-05-19 DIAGNOSIS — E039 Hypothyroidism, unspecified: Secondary | ICD-10-CM | POA: Diagnosis not present

## 2022-05-20 ENCOUNTER — Telehealth: Payer: Self-pay | Admitting: Family Medicine

## 2022-05-20 NOTE — Telephone Encounter (Signed)
Called patient to schedule Medicare Annual Wellness Visit (AWV). Left message for patient to call back and schedule Medicare Annual Wellness Visit (AWV).  Last date of AWV: 06/11/2022  Please schedule an appointment at any time with [facility].  If any questions, please contact me at [phone].  Thank you ,  [place] Direct Dial: [phone]

## 2022-05-20 NOTE — Telephone Encounter (Signed)
Contacted [name] to schedule their annual wellness visit. Appointment made for 06/10/2022.  [place] Direct Dial: [phone]

## 2022-05-25 DIAGNOSIS — G478 Other sleep disorders: Secondary | ICD-10-CM | POA: Diagnosis not present

## 2022-05-25 DIAGNOSIS — J309 Allergic rhinitis, unspecified: Secondary | ICD-10-CM | POA: Diagnosis not present

## 2022-05-25 DIAGNOSIS — K760 Fatty (change of) liver, not elsewhere classified: Secondary | ICD-10-CM | POA: Diagnosis not present

## 2022-05-25 DIAGNOSIS — S32058D Other fracture of fifth lumbar vertebra, subsequent encounter for fracture with routine healing: Secondary | ICD-10-CM | POA: Diagnosis not present

## 2022-05-25 DIAGNOSIS — S32592D Other specified fracture of left pubis, subsequent encounter for fracture with routine healing: Secondary | ICD-10-CM | POA: Diagnosis not present

## 2022-05-25 DIAGNOSIS — S32048D Other fracture of fourth lumbar vertebra, subsequent encounter for fracture with routine healing: Secondary | ICD-10-CM | POA: Diagnosis not present

## 2022-05-25 DIAGNOSIS — M8580 Other specified disorders of bone density and structure, unspecified site: Secondary | ICD-10-CM | POA: Diagnosis not present

## 2022-05-25 DIAGNOSIS — E039 Hypothyroidism, unspecified: Secondary | ICD-10-CM | POA: Diagnosis not present

## 2022-05-25 DIAGNOSIS — M25511 Pain in right shoulder: Secondary | ICD-10-CM | POA: Diagnosis not present

## 2022-05-25 DIAGNOSIS — M25561 Pain in right knee: Secondary | ICD-10-CM | POA: Diagnosis not present

## 2022-05-25 DIAGNOSIS — E1151 Type 2 diabetes mellitus with diabetic peripheral angiopathy without gangrene: Secondary | ICD-10-CM | POA: Diagnosis not present

## 2022-05-25 DIAGNOSIS — H9193 Unspecified hearing loss, bilateral: Secondary | ICD-10-CM | POA: Diagnosis not present

## 2022-05-25 DIAGNOSIS — I10 Essential (primary) hypertension: Secondary | ICD-10-CM | POA: Diagnosis not present

## 2022-05-25 DIAGNOSIS — M25562 Pain in left knee: Secondary | ICD-10-CM | POA: Diagnosis not present

## 2022-05-25 DIAGNOSIS — I951 Orthostatic hypotension: Secondary | ICD-10-CM | POA: Diagnosis not present

## 2022-05-25 DIAGNOSIS — H409 Unspecified glaucoma: Secondary | ICD-10-CM | POA: Diagnosis not present

## 2022-05-25 DIAGNOSIS — E1136 Type 2 diabetes mellitus with diabetic cataract: Secondary | ICD-10-CM | POA: Diagnosis not present

## 2022-05-25 DIAGNOSIS — M5415 Radiculopathy, thoracolumbar region: Secondary | ICD-10-CM | POA: Diagnosis not present

## 2022-05-25 DIAGNOSIS — M791 Myalgia, unspecified site: Secondary | ICD-10-CM | POA: Diagnosis not present

## 2022-05-25 DIAGNOSIS — E1142 Type 2 diabetes mellitus with diabetic polyneuropathy: Secondary | ICD-10-CM | POA: Diagnosis not present

## 2022-05-25 DIAGNOSIS — E785 Hyperlipidemia, unspecified: Secondary | ICD-10-CM | POA: Diagnosis not present

## 2022-05-25 DIAGNOSIS — I8311 Varicose veins of right lower extremity with inflammation: Secondary | ICD-10-CM | POA: Diagnosis not present

## 2022-05-25 DIAGNOSIS — M1990 Unspecified osteoarthritis, unspecified site: Secondary | ICD-10-CM | POA: Diagnosis not present

## 2022-05-25 DIAGNOSIS — G8929 Other chronic pain: Secondary | ICD-10-CM | POA: Diagnosis not present

## 2022-05-27 ENCOUNTER — Telehealth: Payer: Self-pay | Admitting: Family Medicine

## 2022-05-27 NOTE — Telephone Encounter (Signed)
Home Health verbal orders Caller Name: Seward Grater  Agency Name: [facility]   Callback number: [phone]  Requesting OT/PT/Skilled nursing/Social Work/Speech: Skilled nursing   Reason: educate on BP and medications  Frequency: no freq yet, consult first  Please forward to [facility] pool or providers CMA

## 2022-05-28 NOTE — Telephone Encounter (Signed)
Agree with this. Thanks.  

## 2022-05-28 NOTE — Telephone Encounter (Signed)
Spoke with [place] (Maggie was out of office), of [facility], informing her Dr. Reece Agar is giving verbal orders for services requested for pt. She verbalizes understanding and will document for [facility].

## 2022-05-29 ENCOUNTER — Other Ambulatory Visit: Payer: Self-pay | Admitting: Family Medicine

## 2022-05-29 DIAGNOSIS — R7989 Other specified abnormal findings of blood chemistry: Secondary | ICD-10-CM

## 2022-05-29 DIAGNOSIS — E039 Hypothyroidism, unspecified: Secondary | ICD-10-CM

## 2022-05-29 DIAGNOSIS — E1142 Type 2 diabetes mellitus with diabetic polyneuropathy: Secondary | ICD-10-CM

## 2022-05-29 DIAGNOSIS — M85852 Other specified disorders of bone density and structure, left thigh: Secondary | ICD-10-CM

## 2022-05-31 DIAGNOSIS — M25511 Pain in right shoulder: Secondary | ICD-10-CM | POA: Diagnosis not present

## 2022-05-31 DIAGNOSIS — M25562 Pain in left knee: Secondary | ICD-10-CM | POA: Diagnosis not present

## 2022-05-31 DIAGNOSIS — I8311 Varicose veins of right lower extremity with inflammation: Secondary | ICD-10-CM | POA: Diagnosis not present

## 2022-05-31 DIAGNOSIS — I951 Orthostatic hypotension: Secondary | ICD-10-CM | POA: Diagnosis not present

## 2022-05-31 DIAGNOSIS — M1990 Unspecified osteoarthritis, unspecified site: Secondary | ICD-10-CM | POA: Diagnosis not present

## 2022-05-31 DIAGNOSIS — H409 Unspecified glaucoma: Secondary | ICD-10-CM | POA: Diagnosis not present

## 2022-05-31 DIAGNOSIS — M5415 Radiculopathy, thoracolumbar region: Secondary | ICD-10-CM | POA: Diagnosis not present

## 2022-05-31 DIAGNOSIS — S32048D Other fracture of fourth lumbar vertebra, subsequent encounter for fracture with routine healing: Secondary | ICD-10-CM | POA: Diagnosis not present

## 2022-05-31 DIAGNOSIS — J309 Allergic rhinitis, unspecified: Secondary | ICD-10-CM | POA: Diagnosis not present

## 2022-05-31 DIAGNOSIS — E1136 Type 2 diabetes mellitus with diabetic cataract: Secondary | ICD-10-CM | POA: Diagnosis not present

## 2022-05-31 DIAGNOSIS — M8580 Other specified disorders of bone density and structure, unspecified site: Secondary | ICD-10-CM | POA: Diagnosis not present

## 2022-05-31 DIAGNOSIS — M25561 Pain in right knee: Secondary | ICD-10-CM | POA: Diagnosis not present

## 2022-05-31 DIAGNOSIS — G8929 Other chronic pain: Secondary | ICD-10-CM | POA: Diagnosis not present

## 2022-05-31 DIAGNOSIS — H9193 Unspecified hearing loss, bilateral: Secondary | ICD-10-CM | POA: Diagnosis not present

## 2022-05-31 DIAGNOSIS — E1151 Type 2 diabetes mellitus with diabetic peripheral angiopathy without gangrene: Secondary | ICD-10-CM | POA: Diagnosis not present

## 2022-05-31 DIAGNOSIS — S32058D Other fracture of fifth lumbar vertebra, subsequent encounter for fracture with routine healing: Secondary | ICD-10-CM | POA: Diagnosis not present

## 2022-05-31 DIAGNOSIS — G478 Other sleep disorders: Secondary | ICD-10-CM | POA: Diagnosis not present

## 2022-05-31 DIAGNOSIS — S32592D Other specified fracture of left pubis, subsequent encounter for fracture with routine healing: Secondary | ICD-10-CM | POA: Diagnosis not present

## 2022-05-31 DIAGNOSIS — E785 Hyperlipidemia, unspecified: Secondary | ICD-10-CM | POA: Diagnosis not present

## 2022-05-31 DIAGNOSIS — E1142 Type 2 diabetes mellitus with diabetic polyneuropathy: Secondary | ICD-10-CM | POA: Diagnosis not present

## 2022-05-31 DIAGNOSIS — K760 Fatty (change of) liver, not elsewhere classified: Secondary | ICD-10-CM | POA: Diagnosis not present

## 2022-05-31 DIAGNOSIS — I10 Essential (primary) hypertension: Secondary | ICD-10-CM | POA: Diagnosis not present

## 2022-05-31 DIAGNOSIS — M791 Myalgia, unspecified site: Secondary | ICD-10-CM | POA: Diagnosis not present

## 2022-05-31 DIAGNOSIS — E039 Hypothyroidism, unspecified: Secondary | ICD-10-CM | POA: Diagnosis not present

## 2022-06-01 DIAGNOSIS — M25561 Pain in right knee: Secondary | ICD-10-CM | POA: Diagnosis not present

## 2022-06-01 DIAGNOSIS — S32058D Other fracture of fifth lumbar vertebra, subsequent encounter for fracture with routine healing: Secondary | ICD-10-CM | POA: Diagnosis not present

## 2022-06-01 DIAGNOSIS — M8580 Other specified disorders of bone density and structure, unspecified site: Secondary | ICD-10-CM | POA: Diagnosis not present

## 2022-06-01 DIAGNOSIS — M5415 Radiculopathy, thoracolumbar region: Secondary | ICD-10-CM | POA: Diagnosis not present

## 2022-06-01 DIAGNOSIS — E1142 Type 2 diabetes mellitus with diabetic polyneuropathy: Secondary | ICD-10-CM | POA: Diagnosis not present

## 2022-06-01 DIAGNOSIS — I951 Orthostatic hypotension: Secondary | ICD-10-CM | POA: Diagnosis not present

## 2022-06-01 DIAGNOSIS — G478 Other sleep disorders: Secondary | ICD-10-CM | POA: Diagnosis not present

## 2022-06-01 DIAGNOSIS — M25562 Pain in left knee: Secondary | ICD-10-CM | POA: Diagnosis not present

## 2022-06-01 DIAGNOSIS — J309 Allergic rhinitis, unspecified: Secondary | ICD-10-CM | POA: Diagnosis not present

## 2022-06-01 DIAGNOSIS — H409 Unspecified glaucoma: Secondary | ICD-10-CM | POA: Diagnosis not present

## 2022-06-01 DIAGNOSIS — G8929 Other chronic pain: Secondary | ICD-10-CM | POA: Diagnosis not present

## 2022-06-01 DIAGNOSIS — S32048D Other fracture of fourth lumbar vertebra, subsequent encounter for fracture with routine healing: Secondary | ICD-10-CM | POA: Diagnosis not present

## 2022-06-01 DIAGNOSIS — E1136 Type 2 diabetes mellitus with diabetic cataract: Secondary | ICD-10-CM | POA: Diagnosis not present

## 2022-06-01 DIAGNOSIS — E039 Hypothyroidism, unspecified: Secondary | ICD-10-CM | POA: Diagnosis not present

## 2022-06-01 DIAGNOSIS — M1990 Unspecified osteoarthritis, unspecified site: Secondary | ICD-10-CM | POA: Diagnosis not present

## 2022-06-01 DIAGNOSIS — I8311 Varicose veins of right lower extremity with inflammation: Secondary | ICD-10-CM | POA: Diagnosis not present

## 2022-06-01 DIAGNOSIS — K760 Fatty (change of) liver, not elsewhere classified: Secondary | ICD-10-CM | POA: Diagnosis not present

## 2022-06-01 DIAGNOSIS — M791 Myalgia, unspecified site: Secondary | ICD-10-CM | POA: Diagnosis not present

## 2022-06-01 DIAGNOSIS — M25511 Pain in right shoulder: Secondary | ICD-10-CM | POA: Diagnosis not present

## 2022-06-01 DIAGNOSIS — S32592D Other specified fracture of left pubis, subsequent encounter for fracture with routine healing: Secondary | ICD-10-CM | POA: Diagnosis not present

## 2022-06-01 DIAGNOSIS — H9193 Unspecified hearing loss, bilateral: Secondary | ICD-10-CM | POA: Diagnosis not present

## 2022-06-01 DIAGNOSIS — E785 Hyperlipidemia, unspecified: Secondary | ICD-10-CM | POA: Diagnosis not present

## 2022-06-01 DIAGNOSIS — I10 Essential (primary) hypertension: Secondary | ICD-10-CM | POA: Diagnosis not present

## 2022-06-01 DIAGNOSIS — E1151 Type 2 diabetes mellitus with diabetic peripheral angiopathy without gangrene: Secondary | ICD-10-CM | POA: Diagnosis not present

## 2022-06-04 ENCOUNTER — Other Ambulatory Visit (INDEPENDENT_AMBULATORY_CARE_PROVIDER_SITE_OTHER): Payer: Medicare Other

## 2022-06-04 ENCOUNTER — Other Ambulatory Visit: Payer: Self-pay | Admitting: Family Medicine

## 2022-06-04 DIAGNOSIS — R7989 Other specified abnormal findings of blood chemistry: Secondary | ICD-10-CM | POA: Diagnosis not present

## 2022-06-04 DIAGNOSIS — M85852 Other specified disorders of bone density and structure, left thigh: Secondary | ICD-10-CM

## 2022-06-04 DIAGNOSIS — E039 Hypothyroidism, unspecified: Secondary | ICD-10-CM

## 2022-06-04 DIAGNOSIS — E1142 Type 2 diabetes mellitus with diabetic polyneuropathy: Secondary | ICD-10-CM | POA: Diagnosis not present

## 2022-06-04 LAB — COMPREHENSIVE METABOLIC PANEL
ALT: 20 U/L (ref 0–35)
AST: 17 U/L (ref 0–37)
Albumin: 4.3 g/dL (ref 3.5–5.2)
Alkaline Phosphatase: 104 U/L (ref 39–117)
BUN: 17 mg/dL (ref 6–23)
CO2: 30 mEq/L (ref 19–32)
Calcium: 9.4 mg/dL (ref 8.4–10.5)
Chloride: 103 mEq/L (ref 96–112)
Creatinine, Ser: 0.9 mg/dL (ref 0.40–1.20)
GFR: 58.36 mL/min — ABNORMAL LOW (ref >60.00)
Glucose, Bld: 120 mg/dL — ABNORMAL HIGH (ref 70–99)
Potassium: 5 mEq/L (ref 3.5–5.1)
Sodium: 141 mEq/L (ref 135–145)
Total Bilirubin: 0.7 mg/dL (ref 0.2–1.2)
Total Protein: 6.8 g/dL (ref 6.0–8.3)

## 2022-06-04 LAB — LIPID PANEL
Cholesterol: 128 mg/dL (ref 0–200)
HDL: 48.9 mg/dL (ref >39.00)
LDL Cholesterol: 65 mg/dL (ref 0–99)
NonHDL: 78.99
Total CHOL/HDL Ratio: 3
Triglycerides: 72 mg/dL (ref 0.0–149.0)
VLDL: 14.4 mg/dL (ref 0.0–40.0)

## 2022-06-04 LAB — VITAMIN D 25 HYDROXY (VIT D DEFICIENCY, FRACTURES): VITD: 36.32 ng/mL (ref 30.00–100.00)

## 2022-06-04 LAB — MICROALBUMIN / CREATININE URINE RATIO
Creatinine,U: 140.1 mg/dL
Microalb Creat Ratio: 2.3 mg/g (ref 0.0–30.0)
Microalb, Ur: 3.2 mg/dL — ABNORMAL HIGH (ref 0.0–1.9)

## 2022-06-04 LAB — TSH: TSH: 1.65 u[IU]/mL (ref 0.35–5.50)

## 2022-06-04 LAB — VITAMIN B12: Vitamin B-12: 743 pg/mL (ref 211–911)

## 2022-06-07 ENCOUNTER — Telehealth: Payer: Self-pay | Admitting: Family Medicine

## 2022-06-07 DIAGNOSIS — E785 Hyperlipidemia, unspecified: Secondary | ICD-10-CM | POA: Diagnosis not present

## 2022-06-07 DIAGNOSIS — M25511 Pain in right shoulder: Secondary | ICD-10-CM | POA: Diagnosis not present

## 2022-06-07 DIAGNOSIS — G478 Other sleep disorders: Secondary | ICD-10-CM | POA: Diagnosis not present

## 2022-06-07 DIAGNOSIS — M8580 Other specified disorders of bone density and structure, unspecified site: Secondary | ICD-10-CM | POA: Diagnosis not present

## 2022-06-07 DIAGNOSIS — M5415 Radiculopathy, thoracolumbar region: Secondary | ICD-10-CM | POA: Diagnosis not present

## 2022-06-07 DIAGNOSIS — I8311 Varicose veins of right lower extremity with inflammation: Secondary | ICD-10-CM | POA: Diagnosis not present

## 2022-06-07 DIAGNOSIS — J309 Allergic rhinitis, unspecified: Secondary | ICD-10-CM | POA: Diagnosis not present

## 2022-06-07 DIAGNOSIS — S32592D Other specified fracture of left pubis, subsequent encounter for fracture with routine healing: Secondary | ICD-10-CM | POA: Diagnosis not present

## 2022-06-07 DIAGNOSIS — E1136 Type 2 diabetes mellitus with diabetic cataract: Secondary | ICD-10-CM | POA: Diagnosis not present

## 2022-06-07 DIAGNOSIS — M25561 Pain in right knee: Secondary | ICD-10-CM | POA: Diagnosis not present

## 2022-06-07 DIAGNOSIS — K760 Fatty (change of) liver, not elsewhere classified: Secondary | ICD-10-CM | POA: Diagnosis not present

## 2022-06-07 DIAGNOSIS — I10 Essential (primary) hypertension: Secondary | ICD-10-CM | POA: Diagnosis not present

## 2022-06-07 DIAGNOSIS — M791 Myalgia, unspecified site: Secondary | ICD-10-CM | POA: Diagnosis not present

## 2022-06-07 DIAGNOSIS — S32058D Other fracture of fifth lumbar vertebra, subsequent encounter for fracture with routine healing: Secondary | ICD-10-CM | POA: Diagnosis not present

## 2022-06-07 DIAGNOSIS — H9193 Unspecified hearing loss, bilateral: Secondary | ICD-10-CM | POA: Diagnosis not present

## 2022-06-07 DIAGNOSIS — E1142 Type 2 diabetes mellitus with diabetic polyneuropathy: Secondary | ICD-10-CM | POA: Diagnosis not present

## 2022-06-07 DIAGNOSIS — H409 Unspecified glaucoma: Secondary | ICD-10-CM | POA: Diagnosis not present

## 2022-06-07 DIAGNOSIS — E039 Hypothyroidism, unspecified: Secondary | ICD-10-CM | POA: Diagnosis not present

## 2022-06-07 DIAGNOSIS — E1151 Type 2 diabetes mellitus with diabetic peripheral angiopathy without gangrene: Secondary | ICD-10-CM | POA: Diagnosis not present

## 2022-06-07 DIAGNOSIS — G8929 Other chronic pain: Secondary | ICD-10-CM | POA: Diagnosis not present

## 2022-06-07 DIAGNOSIS — M1990 Unspecified osteoarthritis, unspecified site: Secondary | ICD-10-CM | POA: Diagnosis not present

## 2022-06-07 DIAGNOSIS — S32048D Other fracture of fourth lumbar vertebra, subsequent encounter for fracture with routine healing: Secondary | ICD-10-CM | POA: Diagnosis not present

## 2022-06-07 DIAGNOSIS — I951 Orthostatic hypotension: Secondary | ICD-10-CM | POA: Diagnosis not present

## 2022-06-07 DIAGNOSIS — M25562 Pain in left knee: Secondary | ICD-10-CM | POA: Diagnosis not present

## 2022-06-07 LAB — FRUCTOSAMINE: Fructosamine: 279 umol/L (ref 205–285)

## 2022-06-07 NOTE — Telephone Encounter (Signed)
Refill request Atenolol See allergy/contraindication Last refill  10/12/21 #90 Last office visit 05/10/22 Upcoming appointment 06/11/22

## 2022-06-07 NOTE — Telephone Encounter (Signed)
Contacted [name] to schedule their annual wellness visit. Appointment made for 06/08/2022. Left a detailed message informing patient that I had to re-schedule her AWV on 06/10/22 to 06/08/2022. Due to schedule changes.   [facility] Care Guide [facility] AWV TEAM Direct Dial: [phone]

## 2022-06-07 NOTE — Telephone Encounter (Signed)
ERx 

## 2022-06-08 ENCOUNTER — Ambulatory Visit (INDEPENDENT_AMBULATORY_CARE_PROVIDER_SITE_OTHER): Payer: Medicare Other

## 2022-06-08 VITALS — Ht 62.0 in | Wt 161.8 lb

## 2022-06-08 DIAGNOSIS — Z0000 Encounter for general adult medical examination without abnormal findings: Secondary | ICD-10-CM | POA: Diagnosis not present

## 2022-06-08 DIAGNOSIS — Z1231 Encounter for screening mammogram for malignant neoplasm of breast: Secondary | ICD-10-CM

## 2022-06-08 DIAGNOSIS — Z780 Asymptomatic menopausal state: Secondary | ICD-10-CM
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
## 2022-06-08 NOTE — Patient Instructions (Signed)
[name] , Thank you for taking time to come for your Medicare Wellness Visit. I appreciate your ongoing commitment to your health goals. Please review the following plan we discussed and let me know if I can assist you in the future.   These are the goals we discussed:  Goals      Patient Stated     Starting 01/31/2018, I will continue to exercise on stationary bike for 30 minutes daily.      Patient Stated     02/02/2019, I will maintain and continue medications as prescribed.     Patient Stated     No new goals     Track and Manage My Blood Pressure-Hypertension     Timeframe:  Long-Range Goal Priority:  High Start Date:     02/05/21                        Expected End Date:   02/05/22                    Follow Up Date Jan 2023   - check blood pressure daily - choose a place to take my blood pressure (home, clinic or office, retail store) - write blood pressure results in a log or diary    Why is this important?   You won't feel high blood pressure, but it can still hurt your blood vessels.  High blood pressure can cause heart or kidney problems. It can also cause a stroke.  Making lifestyle changes like losing a little weight or eating less salt will help.  Checking your blood pressure at home and at different times of the day can help to control blood pressure.  If the doctor prescribes medicine remember to take it the way the doctor ordered.  Call the office if you cannot afford the medicine or if there are questions about it.     Notes:         This is a list of the screening recommended for you and due dates:  Health Maintenance  Topic Date Due   Zoster (Shingles) Vaccine (1 of 2) Never done   Eye exam for diabetics  01/26/2020   Flu Shot  09/16/2022   Hemoglobin A1C  10/02/2022   Mammogram  10/03/2022   Complete foot exam   10/13/2022   Yearly kidney function blood test for diabetes  06/04/2023   Yearly kidney health urinalysis for diabetes  06/04/2023    Medicare Annual Wellness Visit  06/08/2023   Pneumonia Vaccine  Completed   DEXA scan (bone density measurement)  Completed   COVID-19 Vaccine  Completed   HPV Vaccine  Aged Out   DTaP/Tdap/Td vaccine  Discontinued    Advanced directives: Please bring a copy of your health care power of attorney and living will to the office to be added to your chart at your convenience.   Conditions/risks identified: Aim for 30 minutes of exercise or brisk walking, 6-8 glasses of water, and 5 servings of fruits and vegetables each day.   Next appointment: Follow up in one year for your annual wellness visit 06/13/23 @ 8:45 telephone   Preventive Care 65 Years and Older, Female Preventive care refers to lifestyle choices and visits with your health care provider that can promote health and wellness. What does preventive care include? A yearly physical exam. This is also called an annual well check. Dental exams once or twice a year. Routine eye exams.  Ask your health care provider how often you should have your eyes checked. Personal lifestyle choices, including: Daily care of your teeth and gums. Regular physical activity. Eating a healthy diet. Avoiding tobacco and drug use. Limiting alcohol use. Practicing safe sex. Taking low-dose aspirin every day. Taking vitamin and mineral supplements as recommended by your health care provider. What happens during an annual well check? The services and screenings done by your health care provider during your annual well check will depend on your age, overall health, lifestyle risk factors, and family history of disease. Counseling  Your health care provider may ask you questions about your: Alcohol use. Tobacco use. Drug use. Emotional well-being. Home and relationship well-being. Sexual activity. Eating habits. History of falls. Memory and ability to understand (cognition). Work and work Astronomer. Reproductive health. Screening  You may have  the following tests or measurements: Height, weight, and BMI. Blood pressure. Lipid and cholesterol levels. These may be checked every 5 years, or more frequently if you are over 5 years old. Skin check. Lung cancer screening. You may have this screening every year starting at age 62 if you have a 30-pack-year history of smoking and currently smoke or have quit within the past 15 years. Fecal occult blood test (FOBT) of the stool. You may have this test every year starting at age 81. Flexible sigmoidoscopy or colonoscopy. You may have a sigmoidoscopy every 5 years or a colonoscopy every 10 years starting at age 21. Hepatitis C blood test. Hepatitis B blood test. Sexually transmitted disease (STD) testing. Diabetes screening. This is done by checking your blood sugar (glucose) after you have not eaten for a while (fasting). You may have this done every 1-3 years. Bone density scan. This is done to screen for osteoporosis. You may have this done starting at age 47. Mammogram. This may be done every 1-2 years. Talk to your health care provider about how often you should have regular mammograms. Talk with your health care provider about your test results, treatment options, and if necessary, the need for more tests. Vaccines  Your health care provider may recommend certain vaccines, such as: Influenza vaccine. This is recommended every year. Tetanus, diphtheria, and acellular pertussis (Tdap, Td) vaccine. You may need a Td booster every 10 years. Zoster vaccine. You may need this after age 34. Pneumococcal 13-valent conjugate (PCV13) vaccine. One dose is recommended after age 26. Pneumococcal polysaccharide (PPSV23) vaccine. One dose is recommended after age 92. Talk to your health care provider about which screenings and vaccines you need and how often you need them. This information is not intended to replace advice given to you by your health care provider. Make sure you discuss any questions  you have with your health care provider. Document Released: 02/28/2015 Document Revised: 10/22/2015 Document Reviewed: 12/03/2014 Elsevier Interactive Patient Education  2017 [employer].  Fall Prevention in the Home Falls can cause injuries. They can happen to people of all ages. There are many things you can do to make your home safe and to help prevent falls. What can I do on the outside of my home? Regularly fix the edges of walkways and driveways and fix any cracks. Remove anything that might make you trip as you walk through a door, such as a raised step or threshold. Trim any bushes or trees on the path to your home. Use bright outdoor lighting. Clear any walking paths of anything that might make someone trip, such as rocks or tools. Regularly check to  see if handrails are loose or broken. Make sure that both sides of any steps have handrails. Any raised decks and porches should have guardrails on the edges. Have any leaves, snow, or ice cleared regularly. Use sand or salt on walking paths during winter. Clean up any spills in your garage right away. This includes oil or grease spills. What can I do in the bathroom? Use night lights. Install grab bars by the toilet and in the tub and shower. Do not use towel bars as grab bars. Use non-skid mats or decals in the tub or shower. If you need to sit down in the shower, use a plastic, non-slip stool. Keep the floor dry. Clean up any water that spills on the floor as soon as it happens. Remove soap buildup in the tub or shower regularly. Attach bath mats securely with double-sided non-slip rug tape. Do not have throw rugs and other things on the floor that can make you trip. What can I do in the bedroom? Use night lights. Make sure that you have a light by your bed that is easy to reach. Do not use any sheets or blankets that are too big for your bed. They should not hang down onto the floor. Have a firm chair that has side arms. You  can use this for support while you get dressed. Do not have throw rugs and other things on the floor that can make you trip. What can I do in the kitchen? Clean up any spills right away. Avoid walking on wet floors. Keep items that you use a lot in easy-to-reach places. If you need to reach something above you, use a strong step stool that has a grab bar. Keep electrical cords out of the way. Do not use floor polish or wax that makes floors slippery. If you must use wax, use non-skid floor wax. Do not have throw rugs and other things on the floor that can make you trip. What can I do with my stairs? Do not leave any items on the stairs. Make sure that there are handrails on both sides of the stairs and use them. Fix handrails that are broken or loose. Make sure that handrails are as long as the stairways. Check any carpeting to make sure that it is firmly attached to the stairs. Fix any carpet that is loose or worn. Avoid having throw rugs at the top or bottom of the stairs. If you do have throw rugs, attach them to the floor with carpet tape. Make sure that you have a light switch at the top of the stairs and the bottom of the stairs. If you do not have them, ask someone to add them for you. What else can I do to help prevent falls? Wear shoes that: Do not have high heels. Have rubber bottoms. Are comfortable and fit you well. Are closed at the toe. Do not wear sandals. If you use a stepladder: Make sure that it is fully opened. Do not climb a closed stepladder. Make sure that both sides of the stepladder are locked into place. Ask someone to hold it for you, if possible. Clearly mark and make sure that you can see: Any grab bars or handrails. First and last steps. Where the edge of each step is. Use tools that help you move around (mobility aids) if they are needed. These include: Canes. Walkers. Scooters. Crutches. Turn on the lights when you go into a dark area. Replace any  light bulbs as  soon as they burn out. Set up your furniture so you have a clear path. Avoid moving your furniture around. If any of your floors are uneven, fix them. If there are any pets around you, be aware of where they are. Review your medicines with your doctor. Some medicines can make you feel dizzy. This can increase your chance of falling. Ask your doctor what other things that you can do to help prevent falls. This information is not intended to replace advice given to you by your health care provider. Make sure you discuss any questions you have with your health care provider. Document Released: 11/28/2008 Document Revised: 07/10/2015 Document Reviewed: 03/08/2014 Elsevier Interactive Patient Education  2017 [employer].

## 2022-06-09 DIAGNOSIS — H409 Unspecified glaucoma: Secondary | ICD-10-CM | POA: Diagnosis not present

## 2022-06-09 DIAGNOSIS — J309 Allergic rhinitis, unspecified: Secondary | ICD-10-CM | POA: Diagnosis not present

## 2022-06-09 DIAGNOSIS — G478 Other sleep disorders: Secondary | ICD-10-CM | POA: Diagnosis not present

## 2022-06-09 DIAGNOSIS — M5415 Radiculopathy, thoracolumbar region: Secondary | ICD-10-CM | POA: Diagnosis not present

## 2022-06-09 DIAGNOSIS — S32048D Other fracture of fourth lumbar vertebra, subsequent encounter for fracture with routine healing: Secondary | ICD-10-CM | POA: Diagnosis not present

## 2022-06-09 DIAGNOSIS — K760 Fatty (change of) liver, not elsewhere classified: Secondary | ICD-10-CM | POA: Diagnosis not present

## 2022-06-09 DIAGNOSIS — I8311 Varicose veins of right lower extremity with inflammation: Secondary | ICD-10-CM | POA: Diagnosis not present

## 2022-06-09 DIAGNOSIS — S32592D Other specified fracture of left pubis, subsequent encounter for fracture with routine healing: Secondary | ICD-10-CM | POA: Diagnosis not present

## 2022-06-09 DIAGNOSIS — M791 Myalgia, unspecified site: Secondary | ICD-10-CM | POA: Diagnosis not present

## 2022-06-09 DIAGNOSIS — M25511 Pain in right shoulder: Secondary | ICD-10-CM | POA: Diagnosis not present

## 2022-06-09 DIAGNOSIS — E785 Hyperlipidemia, unspecified: Secondary | ICD-10-CM | POA: Diagnosis not present

## 2022-06-09 DIAGNOSIS — M1990 Unspecified osteoarthritis, unspecified site: Secondary | ICD-10-CM | POA: Diagnosis not present

## 2022-06-09 DIAGNOSIS — E1136 Type 2 diabetes mellitus with diabetic cataract: Secondary | ICD-10-CM | POA: Diagnosis not present

## 2022-06-09 DIAGNOSIS — E1151 Type 2 diabetes mellitus with diabetic peripheral angiopathy without gangrene: Secondary | ICD-10-CM | POA: Diagnosis not present

## 2022-06-09 DIAGNOSIS — I10 Essential (primary) hypertension: Secondary | ICD-10-CM | POA: Diagnosis not present

## 2022-06-09 DIAGNOSIS — M8580 Other specified disorders of bone density and structure, unspecified site: Secondary | ICD-10-CM | POA: Diagnosis not present

## 2022-06-09 DIAGNOSIS — I951 Orthostatic hypotension: Secondary | ICD-10-CM | POA: Diagnosis not present

## 2022-06-09 DIAGNOSIS — H9193 Unspecified hearing loss, bilateral: Secondary | ICD-10-CM | POA: Diagnosis not present

## 2022-06-09 DIAGNOSIS — M25561 Pain in right knee: Secondary | ICD-10-CM | POA: Diagnosis not present

## 2022-06-09 DIAGNOSIS — E039 Hypothyroidism, unspecified: Secondary | ICD-10-CM | POA: Diagnosis not present

## 2022-06-09 DIAGNOSIS — S32058D Other fracture of fifth lumbar vertebra, subsequent encounter for fracture with routine healing: Secondary | ICD-10-CM | POA: Diagnosis not present

## 2022-06-09 DIAGNOSIS — M25562 Pain in left knee: Secondary | ICD-10-CM | POA: Diagnosis not present

## 2022-06-09 DIAGNOSIS — G8929 Other chronic pain: Secondary | ICD-10-CM | POA: Diagnosis not present

## 2022-06-09 DIAGNOSIS — E1142 Type 2 diabetes mellitus with diabetic polyneuropathy: Secondary | ICD-10-CM | POA: Diagnosis not present

## 2022-06-11 ENCOUNTER — Ambulatory Visit (INDEPENDENT_AMBULATORY_CARE_PROVIDER_SITE_OTHER): Payer: Medicare Other | Admitting: Family Medicine

## 2022-06-11 ENCOUNTER — Encounter: Payer: Self-pay | Admitting: Family Medicine

## 2022-06-11 VITALS — BP 134/78 | HR 62 | Temp 97.7°F | Ht 61.5 in | Wt 163.2 lb

## 2022-06-11 DIAGNOSIS — Z0000 Encounter for general adult medical examination without abnormal findings: Secondary | ICD-10-CM

## 2022-06-11 DIAGNOSIS — I10 Essential (primary) hypertension: Secondary | ICD-10-CM | POA: Diagnosis not present

## 2022-06-11 DIAGNOSIS — R7989 Other specified abnormal findings of blood chemistry: Secondary | ICD-10-CM

## 2022-06-11 DIAGNOSIS — E1142 Type 2 diabetes mellitus with diabetic polyneuropathy: Secondary | ICD-10-CM

## 2022-06-11 DIAGNOSIS — I951 Orthostatic hypotension: Secondary | ICD-10-CM

## 2022-06-11 DIAGNOSIS — S32009A Unspecified fracture of unspecified lumbar vertebra, initial encounter for closed fracture: Secondary | ICD-10-CM

## 2022-06-11 DIAGNOSIS — M85852 Other specified disorders of bone density and structure, left thigh: Secondary | ICD-10-CM

## 2022-06-11 DIAGNOSIS — E039 Hypothyroidism, unspecified: Secondary | ICD-10-CM | POA: Diagnosis not present

## 2022-06-11 DIAGNOSIS — I872 Venous insufficiency (chronic) (peripheral): Secondary | ICD-10-CM | POA: Diagnosis not present

## 2022-06-11 DIAGNOSIS — H401130 Primary open-angle glaucoma, bilateral, stage unspecified: Secondary | ICD-10-CM

## 2022-06-11 DIAGNOSIS — S32592D Other specified fracture of left pubis, subsequent encounter for fracture with routine healing: Secondary | ICD-10-CM

## 2022-06-11 DIAGNOSIS — R1013 Epigastric pain: Secondary | ICD-10-CM

## 2022-06-11 DIAGNOSIS — J309 Allergic rhinitis, unspecified: Secondary | ICD-10-CM

## 2022-06-11 DIAGNOSIS — K219 Gastro-esophageal reflux disease without esophagitis: Secondary | ICD-10-CM

## 2022-06-11 DIAGNOSIS — I8311 Varicose veins of right lower extremity with inflammation: Secondary | ICD-10-CM

## 2022-06-11 DIAGNOSIS — Z7189 Other specified counseling: Secondary | ICD-10-CM

## 2022-06-11 DIAGNOSIS — K760 Fatty (change of) liver, not elsewhere classified: Secondary | ICD-10-CM | POA: Diagnosis not present

## 2022-06-11 DIAGNOSIS — R053 Chronic cough: Secondary | ICD-10-CM | POA: Diagnosis not present

## 2022-06-11 DIAGNOSIS — F5104 Psychophysiologic insomnia: Secondary | ICD-10-CM | POA: Insufficient documentation

## 2022-06-11 MED ORDER — OMEPRAZOLE 40 MG PO CPDR: 40.0000 mg | DELAYED_RELEASE_CAPSULE | ORAL | 4 refills | Status: DC

## 2022-06-11 MED ORDER — ATORVASTATIN CALCIUM 20 MG PO TABS: 20.0000 mg | ORAL_TABLET | ORAL | 4 refills | Status: DC

## 2022-06-11 MED ORDER — LEVOTHYROXINE SODIUM 100 MCG PO TABS: 100.0000 ug | ORAL_TABLET | Freq: Every day | ORAL | 4 refills | Status: DC

## 2022-06-11 MED ORDER — VITAMIN D 50 MCG (2000 UT) PO CAPS: 1.0000 | ORAL_CAPSULE | Freq: Every day | ORAL | Status: DC

## 2022-06-11 MED ORDER — ATENOLOL 50 MG PO TABS: 50.0000 mg | ORAL_TABLET | Freq: Every day | ORAL | 3 refills | Status: DC

## 2022-06-11 MED ORDER — VITAMIN C 1000 MG PO TABS: 1000.0000 mg | ORAL_TABLET | Freq: Every day | ORAL | Status: DC

## 2022-06-11 NOTE — Assessment & Plan Note (Addendum)
Continue regular calcium intake, increase vit D to 2000 IU daily

## 2022-06-11 NOTE — Assessment & Plan Note (Signed)
Stable symptoms only on montelukast.

## 2022-06-11 NOTE — Assessment & Plan Note (Signed)
Preventative protocols reviewed and updated unless pt declined. Discussed healthy diet and lifestyle.  

## 2022-06-11 NOTE — Assessment & Plan Note (Signed)
Chronic, stable only on atenolol 50mg  daily.

## 2022-06-11 NOTE — Assessment & Plan Note (Signed)
H/o this, LFTs stable.  

## 2022-06-11 NOTE — Assessment & Plan Note (Signed)
Regularly  sees [facility].  

## 2022-06-11 NOTE — Assessment & Plan Note (Signed)
Chronic, stable, diet controlled.  

## 2022-06-11 NOTE — Assessment & Plan Note (Signed)
Overall stable period on QOD PPI  See below [id number]

## 2022-06-11 NOTE — Assessment & Plan Note (Signed)
Notes worsening cough, without significant symptoms for allergic rhinitis. It may have worsened after she dropped omeprazole to QOD dosing suggesting GERD component. Recommend pepcid 20mg  nightly with continued PPI QOD, and if not better, then may retry PPI QD dosing.

## 2022-06-11 NOTE — Patient Instructions (Addendum)
Claretha Cooper dosis de vitamina D a 2000 unidades diarias  Gusto verla hoy Regresar en 4 meses para proxima visita de control de diabetes.   Trate melatonina 5mg  por la noche.   Rutina antes de acostarse: 1. Evite las siestas durante el da 2. Evite los estimulantes como la cafena y la nicotina. Evite el alcohol a la hora de Teacher, music (puede acelerar el inicio del sueo, pero el metabolismo del cuerpo puede causar que se despierte). 3. Todas las formas de ejercicio ayudan a garantizar un sueo profundo: limite el ejercicio vigoroso a Hotel manager o al final de la tarde 4. Evite los alimentos demasiado cerca de la hora de [place], incluido el chocolate (que contiene cafena) 5. Absorbe la luz natural 6. Establezca una rutina regular a la hora de [place]. 7. Asocie la cama con el sueo: evite la televisin, la computadora o el telfono, o leer mucho mientras est en la cama. 8. Asegure un ambiente agradable, fresco y relajante para dormir: una habitacin tranquila, Patent attorney.

## 2022-06-11 NOTE — Assessment & Plan Note (Signed)
States longstanding difficulty with this Recommend good sleep hygiene measures, handout provided.  Suggested trial of melatonin 5mg  nightly, update with effect.

## 2022-06-11 NOTE — Assessment & Plan Note (Signed)
Stable period only on atenolol.  Encouraged compression stocking use.

## 2022-06-11 NOTE — Assessment & Plan Note (Signed)
Chronic, stable. Continue levothyroxine 100mcg daily  

## 2022-06-11 NOTE — Assessment & Plan Note (Addendum)
Continue b12 500mcg daily.  

## 2022-06-11 NOTE — Assessment & Plan Note (Signed)
Continues healing well. Did not see ortho.

## 2022-06-11 NOTE — Assessment & Plan Note (Signed)
Encouraged regular compression stocking use.  

## 2022-06-11 NOTE — Assessment & Plan Note (Signed)
Advanced directive discussion - has this at home. Would want daughter [name] to be HCPOA. asked to bring [place] a copy. Full code.
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
## 2022-09-08 ENCOUNTER — Telehealth: Payer: Self-pay | Admitting: Family Medicine

## 2022-09-08 NOTE — Telephone Encounter (Signed)
I spoke with pt; pt said earlier this morning pt took BP med and then later in morning BP 184/98; pt was feeling very tired. Pt was not hurting anywhere and was not upset.No weakness in extremities, no CP,SOB,H/A, dizziness or vision changes. Pt said she did seem to walk to the left; pt could not walk straight. Pt just recked BP BP now 161/86 P 66. Pt said she is feeling better with no symptoms now. Pt wanted to schedule appt at [facility] on 09/09/22 at 11:45 with Dr Alphonsus Sias. Pt said if she felt worse or any concerning symptoms or if BP went up she would go to ED tonight., UC & ED precautions given again and pt and her granddaughter who was also on speaker phone voiced understanding  sending note to Dr Reece Agar as PCP and Dr Alphonsus Sias.

## 2022-09-08 NOTE — Telephone Encounter (Signed)
FYI: This call has been transferred to Access Nurse. Once the result note has been entered staff can address the message at that time.  Patient called in with the following symptoms:  Red Word:elevated blood pressure184/98,fatigued,very tired    Please advise at [facility] [phone]  Message is routed to Provider Pool and [facility] Triage
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
## 2022-09-08 NOTE — Telephone Encounter (Signed)
Out come from Access nurse was ED, patient refused and wanted to wait to see if she felt better, waiting on Access report to forward to clinical team

## 2022-09-09 ENCOUNTER — Encounter: Payer: Self-pay | Admitting: Internal Medicine

## 2022-09-09 ENCOUNTER — Ambulatory Visit (INDEPENDENT_AMBULATORY_CARE_PROVIDER_SITE_OTHER): Payer: Medicare Other | Admitting: Internal Medicine

## 2022-09-09 VITALS — BP 136/76 | HR 60 | Temp 97.9°F | Ht 61.5 in | Wt 166.0 lb

## 2022-09-09 DIAGNOSIS — I10 Essential (primary) hypertension: Secondary | ICD-10-CM

## 2022-09-09 LAB — COMPREHENSIVE METABOLIC PANEL
ALT: 21 U/L (ref 0–35)
AST: 16 U/L (ref 0–37)
Albumin: 4.1 g/dL (ref 3.5–5.2)
Alkaline Phosphatase: 90 U/L (ref 39–117)
BUN: 19 mg/dL (ref 6–23)
CO2: 30 mEq/L (ref 19–32)
Calcium: 9.5 mg/dL (ref 8.4–10.5)
Chloride: 104 mEq/L (ref 96–112)
Creatinine, Ser: 0.83 mg/dL (ref 0.40–1.20)
GFR: 64.19 mL/min (ref >60.00)
Glucose, Bld: 99 mg/dL (ref 70–99)
Potassium: 4.5 mEq/L (ref 3.5–5.1)
Sodium: 139 mEq/L (ref 135–145)
Total Bilirubin: 0.6 mg/dL (ref 0.2–1.2)
Total Protein: 6.7 g/dL (ref 6.0–8.3)

## 2022-09-09 LAB — CBC
HCT: 43.4 % (ref 36.0–46.0)
Hemoglobin: 13.7 g/dL (ref 12.0–15.0)
MCHC: 31.5 g/dL (ref 30.0–36.0)
MCV: 91.7 fl (ref 78.0–100.0)
Platelets: 210 10*3/uL (ref 150.0–400.0)
RBC: 4.73 Mil/uL (ref 3.87–5.11)
RDW: 14.5 % (ref 11.5–15.5)
WBC: 6 10*3/uL (ref 4.0–10.5)

## 2022-09-09 LAB — TSH: TSH: 0.59 u[IU]/mL (ref 0.35–5.50)

## 2022-09-09 NOTE — Assessment & Plan Note (Signed)
BP Readings from Last 3 Encounters:  09/09/22 136/76  06/11/22 134/78  05/10/22 126/62   Repeat by me 166/80 on right She got 148/83  on her wrist cuff Remains on atenolol 50 daily  Discussed my concerns about overtreatment--due to immediate risks if she falls due to orthostasis No change for now They will monitor at home---if persistently elevated, would add back losartan 25 or amlodipine 2.5mg  Will check labs due to the fatigue
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
## 2022-09-09 NOTE — Telephone Encounter (Signed)
OKay---I will assess her at the visit today

## 2022-09-15 ENCOUNTER — Encounter (INDEPENDENT_AMBULATORY_CARE_PROVIDER_SITE_OTHER): Payer: Self-pay

## 2022-10-11 ENCOUNTER — Ambulatory Visit (INDEPENDENT_AMBULATORY_CARE_PROVIDER_SITE_OTHER): Payer: Medicare Other | Admitting: Family Medicine

## 2022-10-11 ENCOUNTER — Encounter: Payer: Self-pay | Admitting: Family Medicine

## 2022-10-11 VITALS — BP 118/74 | HR 53 | Temp 98.7°F | Ht 61.5 in | Wt 165.2 lb

## 2022-10-11 DIAGNOSIS — I10 Essential (primary) hypertension: Secondary | ICD-10-CM | POA: Diagnosis not present

## 2022-10-11 DIAGNOSIS — R252 Cramp and spasm: Secondary | ICD-10-CM | POA: Diagnosis not present

## 2022-10-11 DIAGNOSIS — E1142 Type 2 diabetes mellitus with diabetic polyneuropathy: Secondary | ICD-10-CM

## 2022-10-11 DIAGNOSIS — G6289 Other specified polyneuropathies: Secondary | ICD-10-CM | POA: Diagnosis not present

## 2022-10-11 DIAGNOSIS — R5382 Chronic fatigue, unspecified: Secondary | ICD-10-CM | POA: Diagnosis not present

## 2022-10-11 DIAGNOSIS — G629 Polyneuropathy, unspecified: Secondary | ICD-10-CM | POA: Diagnosis not present

## 2022-10-11 LAB — POCT GLYCOSYLATED HEMOGLOBIN (HGB A1C): Hemoglobin A1C: 6.6 % — AB (ref 4.0–5.6)

## 2022-10-11 LAB — CK: Total CK: 131 U/L (ref 7–177)

## 2022-10-11 NOTE — Assessment & Plan Note (Signed)
Presumed diabetes related.  Update further labwork including B1, B6, SPEP, ANA.

## 2022-10-11 NOTE — Patient Instructions (Addendum)
Pida examen de diabetes para los ojos en su proxima cita con optalmologo.  Cambia atenolol a 25mg  (1/2 tableta) dos veces al dia.  Regresar en 4 meses para proxima visita.

## 2022-10-11 NOTE — Assessment & Plan Note (Addendum)
Update CPK.  She is on lipitor as well as CoQ10 supplement.

## 2022-10-11 NOTE — Progress Notes (Signed)
Ph: [phone] Fax: [phone]   Patient ID: [name], female    DOB: 1934-10-01, 88 y.o.   MRN: [id number]  This visit was conducted in person.  BP 118/74   Pulse (!) 53   Temp 98.7 F (37.1 C)   Ht 5' 1.5" (1.562 m)   Wt 165 lb 3.2 oz (74.9 kg)   SpO2 98%   BMI 30.71 kg/m   BP Readings from Last 3 Encounters:  10/11/22 118/74  09/09/22 136/76  06/11/22 134/78    CC: DM f/u visit   Subjective:   HPI: [name] is a 88 y.o. female presenting on 10/11/2022 for Medical Management of Chronic Issues   H/o elevated blood pressure readings recently after previous overtreatment so amlodipine/losartan were held - saw Dr Alphonsus Sias last month - rec continue atenolol 50mg  daily - home BP readings run high.   DM - does regularly check sugars - well controlled. Compliant with antihyperglycemic regimen which includes: diet controlled. Denies low sugars or hypoglycemic symptoms. Denies blurry vision. Occ finger paresthesias. Last diabetic eye exam DUE. Glucometer brand: one touch ultra 2. Last foot exam: DUE. DSME: declines. Lab Results  Component Value Date   HGBA1C 6.6 (A) 10/11/2022   Diabetic Foot Exam - Simple   Simple Foot Form Diabetic Foot exam was performed with the following findings: Yes 10/11/2022  2:03 PM  Visual Inspection No deformities, no ulcerations, no other skin breakdown bilaterally: Yes Sensation Testing See comments: Yes Pulse Check Posterior Tibialis and Dorsalis pulse intact bilaterally: Yes Comments Diminished sensation to soles at monofilament testing.     Lab Results  Component Value Date   MICROALBUR 3.2 (H) 06/04/2022   Notes intermittent chronic fatigue for years.  Notes intermittent leg cramping for years     Relevant past medical, surgical, family and social history reviewed and updated as indicated. Interim medical history since our last visit reviewed. Allergies and medications reviewed and updated. Outpatient Medications  Prior to Visit  Medication Sig Dispense Refill   Ascorbic Acid (VITAMIN C) 1000 MG tablet Take 1 tablet (1,000 mg total) by mouth daily.     atorvastatin (LIPITOR) 20 MG tablet Take 1 tablet (20 mg total) by mouth once a week. 12 tablet 4   Blood Glucose Monitoring Suppl (ONE TOUCH ULTRA 2) w/Device KIT Use to check sugars daily E11.69 1 kit 0   Cholecalciferol (VITAMIN D) 50 MCG (2000 UT) CAPS Take 1 capsule (2,000 Units total) by mouth daily. 30 capsule    Coenzyme Q10 (COQ10) 100 MG CAPS Take 1 capsule by mouth daily.     cyanocobalamin (V-R VITAMIN B-12) 500 MCG tablet Take 1 tablet (500 mcg total) by mouth daily.     glucose blood (ONETOUCH ULTRA) test strip USE TO CHECK BLOOD SUGAR ONCE DAILY 100 strip 4   Lancet Device MISC One touch delica - Use as directed 1 each 1   Lancets (ONETOUCH DELICA PLUS LANCET33G) MISC USE AS DIRECTED TO CHECK BLOOD SUGAR ONCE A DAY 100 each 3   Lancets Misc. (ACCU-CHEK SOFTCLIX LANCET DEV) KIT Use at home to test blood sugars daily. 1 kit 0   levothyroxine (SYNTHROID) 100 MCG tablet Take 1 tablet (100 mcg total) by mouth daily. 90 tablet 4   montelukast (SINGULAIR) 10 MG tablet TAKE 1 TABLET BY MOUTH DAILY 100 tablet 2   omeprazole (PRILOSEC) 40 MG capsule Take 1 capsule (40 mg total) by mouth every Monday, Wednesday, and Friday. 40 capsule 4  atenolol (TENORMIN) 50 MG tablet Take 1 tablet (50 mg total) by mouth daily. 100 tablet 3   atenolol (TENORMIN) 50 MG tablet Take 0.5 tablets (25 mg total) by mouth 2 (two) times daily.     No facility-administered medications prior to visit.     Per HPI unless specifically indicated in ROS section below Review of Systems  Objective:  BP 118/74   Pulse (!) 53   Temp 98.7 F (37.1 C)   Ht 5' 1.5" (1.562 m)   Wt 165 lb 3.2 oz (74.9 kg)   SpO2 98%   BMI 30.71 kg/m   Wt Readings from Last 3 Encounters:  10/11/22 165 lb 3.2 oz (74.9 kg)  09/09/22 166 lb (75.3 kg)  06/11/22 163 lb 4 oz (74 kg)       Physical Exam Vitals and nursing note reviewed.  Constitutional:      Appearance: Normal appearance. She is not ill-appearing.  Eyes:     Extraocular Movements: Extraocular movements intact.     Conjunctiva/sclera: Conjunctivae normal.     Pupils: Pupils are equal, round, and reactive to light.  Cardiovascular:     Rate and Rhythm: Normal rate and regular rhythm.     Pulses: Normal pulses.     Heart sounds: Normal heart sounds. No murmur heard. Pulmonary:     Effort: Pulmonary effort is normal. No respiratory distress.     Breath sounds: Normal breath sounds. No wheezing, rhonchi or rales.  Musculoskeletal:     Right lower leg: No edema.     Left lower leg: No edema.     Comments: See HPI for foot exam if done  Skin:    General: Skin is warm and dry.     Findings: No rash.  Neurological:     Mental Status: She is alert.  Psychiatric:        Mood and Affect: Mood normal.        Behavior: Behavior normal.       Results for orders placed or performed in visit on 10/11/22  HgB A1c  Result Value Ref Range   Hemoglobin A1C 6.6 (A) 4.0 - 5.6 %   HbA1c POC (<> result, manual entry)     HbA1c, POC (prediabetic range)     HbA1c, POC (controlled diabetic range)     Lab Results  Component Value Date   NA 139 09/09/2022   CL 104 09/09/2022   K 4.5 09/09/2022   CO2 30 09/09/2022   BUN 19 09/09/2022   CREATININE 0.83 09/09/2022   GFR 64.19 09/09/2022   CALCIUM 9.5 09/09/2022   ALBUMIN 4.1 09/09/2022   GLUCOSE 99 09/09/2022   Lab Results  Component Value Date   WBC 6.0 09/09/2022   HGB 13.7 09/09/2022   HCT 43.4 09/09/2022   MCV 91.7 09/09/2022   PLT 210.0 09/09/2022   Lab Results  Component Value Date   VITAMINB12 743 06/04/2022     Assessment & Plan:   Problem List Items Addressed This Visit     Type 2 diabetes, controlled, with peripheral neuropathy (HCC) - Primary    Chronic, diet controlled.  H/o hypoglycemia with oral antihyperglycemics.  Encouraged she  ask for diabetic eye exam at next eye doctor appointment (regularly seen for glaucoma)      Relevant Orders   HgB A1c (Completed)   ANA Screen,IFA,Reflex Titer/Pattern,Reflex Mplx 11 Ab Cascade with IdentRA   CK   Vitamin B1   Vitamin B6   Serum protein electrophoresis  with reflex   Essential hypertension    Chronic, significant fluctuations in blood pressure control at home.  BP today well controlled however home readings on wrist cuff this morning 150s systolic.  I asked her to change atenolol to 25mg  BID.       Relevant Medications   atenolol (TENORMIN) 50 MG tablet   Leg cramps    Update CPK.  She is on lipitor as well as CoQ10 supplement.       Relevant Orders   ANA Screen,IFA,Reflex Titer/Pattern,Reflex Mplx 11 Ab Cascade with IdentRA   CK   Vitamin B1   Vitamin B6   Serum protein electrophoresis with reflex   Chronic fatigue   Relevant Orders   ANA Screen,IFA,Reflex Titer/Pattern,Reflex Mplx 11 Ab Cascade with IdentRA   CK   Vitamin B1   Vitamin B6   Serum protein electrophoresis with reflex   Peripheral neuropathy    Presumed diabetes related.  Update further labwork including B1, B6, SPEP, ANA.       Relevant Orders   ANA Screen,IFA,Reflex Titer/Pattern,Reflex Mplx 11 Ab Cascade with IdentRA   CK   Vitamin B1   Vitamin B6   Serum protein electrophoresis with reflex     No orders of the defined types were placed in this encounter.   Orders Placed This Encounter  Procedures   ANA Screen,IFA,Reflex Titer/Pattern,Reflex Mplx 11 Ab Cascade with IdentRA   CK   Vitamin B1   Vitamin B6   Serum protein electrophoresis with reflex   HgB A1c    Patient Instructions  Pida examen de diabetes para los ojos en su proxima cita con optalmologo.  Cambia atenolol a 25mg  (1/2 tableta) dos veces al dia.  Regresar en 4 meses para proxima visita.  Follow up plan: Return in 4 months (on 02/10/2023), or if symptoms worsen or fail to improve, for follow up  visit.  Eustaquio Boyden, MD

## 2022-10-11 NOTE — Assessment & Plan Note (Signed)
Chronic, significant fluctuations in blood pressure control at home.  BP today well controlled however home readings on wrist cuff this morning 150s systolic.  I asked her to change atenolol to 25mg  BID.

## 2022-10-11 NOTE — Assessment & Plan Note (Addendum)
Chronic, diet controlled.  H/o hypoglycemia with oral antihyperglycemics.  Encouraged she ask for diabetic eye exam at next eye doctor appointment (regularly seen for glaucoma)

## 2022-10-15 LAB — VITAMIN B1: Vitamin B1 (Thiamine): 16 nmol/L (ref 8–30)

## 2022-10-15 LAB — VITAMIN B6: Vitamin B6: 42.6 ng/mL — ABNORMAL HIGH (ref 2.1–21.7)

## 2022-10-19 ENCOUNTER — Telehealth: Payer: Self-pay

## 2022-10-19 DIAGNOSIS — E039 Hypothyroidism, unspecified: Secondary | ICD-10-CM

## 2022-10-19 NOTE — Telephone Encounter (Signed)
Received faxed message from [facility] that levothyroxine 100 mcg manufacturer is changing from Amneal to Lupin. Requests to fill under new manufacturer.

## 2022-10-20 NOTE — Telephone Encounter (Signed)
Please notify patient and if okayed by her okay to change manufacturer. Please schedule lab visit 2 months after starting new pills to recheck thyroid function.

## 2022-10-20 NOTE — Addendum Note (Signed)
Addended by: Eustaquio Boyden on: 10/20/2022 07:22 AM   Modules accepted: Orders

## 2022-10-21 LAB — ANTI-NUCLEAR AB-TITER (ANA TITER)
ANA TITER: 1:40 {titer} — ABNORMAL HIGH
ANA Titer 1: 1:80 {titer} — ABNORMAL HIGH

## 2022-10-21 LAB — TIER 3
Centromere Ab Screen: <1 AI
Ribosomal P Protein Ab: <1 AI

## 2022-10-21 LAB — PROTEIN ELECTROPHORESIS, SERUM, WITH REFLEX
Albumin ELP: 3.9 g/dL (ref 3.8–4.8)
Alpha 1: 0.2 g/dL (ref 0.2–0.3)
Alpha 2: 0.8 g/dL (ref 0.5–0.9)
Beta 2: 0.3 g/dL (ref 0.2–0.5)
Beta Globulin: 0.5 g/dL (ref 0.4–0.6)
Gamma Globulin: 1 g/dL (ref 0.8–1.7)
Total Protein: 6.8 g/dL (ref 6.1–8.1)

## 2022-10-21 LAB — TIER 1
Chromatin (Nucleosomal) Antibody: <1 AI
ENA SM Ab Ser-aCnc: <1 AI
Ribonucleic Protein(ENA) Antibody, IgG: <1 AI
SM/RNP: <1 AI
ds DNA Ab: <1 [IU]/mL

## 2022-10-21 LAB — INTERPRETATION

## 2022-10-21 LAB — TIER 2
Jo-1 Autoabs: <1 AI
SSA (Ro) (ENA) Antibody, IgG: <1 AI
SSB (La) (ENA) Antibody, IgG: <1 AI
Scleroderma (Scl-70) (ENA) Antibody, IgG: <1 AI

## 2022-10-21 LAB — ANA SCREEN,IFA,REFLEX TITER/PATTERN,REFLEX MPLX 11 AB CASCADE
Anti Nuclear Antibody (ANA): POSITIVE — AB
Cyclic Citrullin Peptide Ab: <16 UNITS
MUTATED CITRULLINATED VIMENTIN (MCV) AB: <20 U/mL (ref <20)
Rheumatoid fact SerPl-aCnc: 10 [IU]/mL (ref <14)

## 2022-10-21 NOTE — Telephone Encounter (Signed)
Patient returned call to [facility]. Would like a call back

## 2022-10-21 NOTE — Telephone Encounter (Signed)
Lvm asking pt to call back. Need to talk with pt concerning levothyroxine manufacturer change.

## 2022-10-21 NOTE — Telephone Encounter (Signed)
Spoke with pt notifying her of manufacturer change and Dr Timoteo Expose message. Pt verbalizes understanding, agrees to change and will call back to schedule 2 mo lab visit to rechk thyroid.   Spoke with [facility] notifying them both pt and Dr Reece Agar agree to change. States they will document and send out new med shipment.

## 2022-10-25 DIAGNOSIS — H401133 Primary open-angle glaucoma, bilateral, severe stage: Secondary | ICD-10-CM | POA: Diagnosis not present

## 2022-10-28 ENCOUNTER — Telehealth: Payer: Self-pay | Admitting: Family Medicine

## 2022-10-28 NOTE — Telephone Encounter (Signed)
Spoke with pt scheduling thyroid labs on 11/12/22 at 10:30. Plz place order.

## 2022-10-28 NOTE — Telephone Encounter (Signed)
Patient called in and stated that she just got her medication the Levothyroxine and patient also wants to know do she make her 2 week appointment or is the nurse going to make the appointment patient would like a call back if possible.

## 2022-10-29 NOTE — Telephone Encounter (Signed)
Lab previously ordered. This needs to be 2 month lab visit, not 2 week.  Ok to do 4-6 wks after starting new manufacturer tablets.

## 2022-10-29 NOTE — Telephone Encounter (Signed)
Lvm asking pt to call back. Need to r/s 11/12/22 thyroid lab visit around end of Nov (11/18- 01/12/23).

## 2022-11-01 NOTE — Telephone Encounter (Signed)
Lvm asking pt to call back. Need to r/s 11/12/22 thyroid lab visit around end of Nov (11/18- 01/12/23).

## 2022-11-02 NOTE — Telephone Encounter (Signed)
Tried to attempt calling patient again,went to VM

## 2022-11-09 ENCOUNTER — Other Ambulatory Visit: Payer: Self-pay | Admitting: Family Medicine

## 2022-11-09 DIAGNOSIS — E1142 Type 2 diabetes mellitus with diabetic polyneuropathy: Secondary | ICD-10-CM

## 2022-11-12 ENCOUNTER — Other Ambulatory Visit: Payer: Medicare Other

## 2022-11-21 ENCOUNTER — Ambulatory Visit
Admission: EM | Admit: 2022-11-21 | Discharge: 2022-11-21 | Disposition: A | Discharge status: Home / Self Care | Payer: Medicare Other | Attending: Emergency Medicine | Admitting: Emergency Medicine

## 2022-11-21 DIAGNOSIS — H00025 Hordeolum internum left lower eyelid: Secondary | ICD-10-CM

## 2022-11-21 MED ORDER — ERYTHROMYCIN 5 MG/GM OP OINT: TOPICAL_OINTMENT | OPHTHALMIC | 0 refills | Status: DC
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
## 2022-11-21 NOTE — ED Triage Notes (Signed)
Pt c/o L eye swelling,drainage & irritation x2 days. Has tried eye drops & warm compresses w/o relief.

## 2022-11-21 NOTE — Discharge Instructions (Addendum)
Continue to apply warm compresses.  Perform eyelid hygiene with baby shampoo in a rich lather and vigorous scrubbing of your eyelids twice daily. Rinse thoroughly with water.  Apply Erythromycin ointment to the eyelid margin with a clean Q-tip twice daily, and a 1/2 inch ribbon to the inside of the lower lid. Blink several times to liquify the ointment and spread it ober the inside of your eyelids.  If your symptoms do not improve, or you develop changes in your vision follow up with your eye doctor.

## 2023-01-11 ENCOUNTER — Ambulatory Visit
Admission: RE | Admit: 2023-01-11 | Discharge: 2023-01-11 | Disposition: A | Discharge status: Home / Self Care | Payer: Medicare Other | Source: Ambulatory Visit | Attending: Family Medicine | Admitting: Family Medicine

## 2023-01-11 DIAGNOSIS — Z1231 Encounter for screening mammogram for malignant neoplasm of breast: Secondary | ICD-10-CM | POA: Insufficient documentation

## 2023-01-12 ENCOUNTER — Other Ambulatory Visit (INDEPENDENT_AMBULATORY_CARE_PROVIDER_SITE_OTHER): Payer: Medicare Other

## 2023-01-12 DIAGNOSIS — E039 Hypothyroidism, unspecified: Secondary | ICD-10-CM

## 2023-01-12 LAB — TSH: TSH: 2.68 u[IU]/mL (ref 0.35–5.50)

## 2023-01-17 ENCOUNTER — Ambulatory Visit
Admission: RE | Admit: 2023-01-17 | Discharge: 2023-01-17 | Disposition: A | Discharge status: Home / Self Care | Payer: Medicare Other | Source: Ambulatory Visit | Attending: Family Medicine | Admitting: Family Medicine

## 2023-01-17 ENCOUNTER — Other Ambulatory Visit: Payer: Self-pay | Admitting: Family Medicine

## 2023-01-17 DIAGNOSIS — R928 Other abnormal and inconclusive findings on diagnostic imaging of breast: Secondary | ICD-10-CM | POA: Insufficient documentation

## 2023-01-17 DIAGNOSIS — R92321 Mammographic fibroglandular density, right breast: Secondary | ICD-10-CM | POA: Diagnosis not present

## 2023-01-17 NOTE — Telephone Encounter (Signed)
Opened in error

## 2023-01-27 ENCOUNTER — Encounter: Payer: Self-pay | Admitting: Podiatry

## 2023-01-27 ENCOUNTER — Ambulatory Visit: Payer: Medicare Other | Admitting: Podiatry

## 2023-01-27 VITALS — Ht 62.0 in | Wt 164.0 lb

## 2023-01-27 DIAGNOSIS — L84 Corns and callosities: Secondary | ICD-10-CM | POA: Diagnosis not present
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
## 2023-01-28 DIAGNOSIS — S60012A Contusion of left thumb without damage to nail, initial encounter: Secondary | ICD-10-CM | POA: Diagnosis not present

## 2023-01-28 DIAGNOSIS — M75122 Complete rotator cuff tear or rupture of left shoulder, not specified as traumatic: Secondary | ICD-10-CM | POA: Diagnosis not present

## 2023-01-30 NOTE — Progress Notes (Unsigned)
    Karina Nofsinger T. Valta Dillon, MD, CAQ Sports Medicine [facility] at [facility] [address] [place], [address]  Phone: [phone]  FAX: [phone]  MYAUNA CULLINAN - 88 y.o. female  MRN [id number]  Date of Birth: 1935/01/04  Date: 01/31/2023  PCP: Eustaquio Boyden, MD  Referral: Eustaquio Boyden, MD  No chief complaint on file.  Subjective:   VIVI FASSLER is a 88 y.o. very pleasant female patient with There is no height or weight on file to calculate BMI. who presents with the following:  The patient presents with ongoing left-sided shoulder pain after a mechanical fall.  I did see her earlier in the year with some chronic shoulder pain on the right and chronic arthritis of the glenohumeral joint.    Review of Systems is noted in the HPI, as appropriate  Objective:   There were no vitals taken for this visit.  GEN: No acute distress; alert,appropriate. PULM: Breathing comfortably in no respiratory distress PSYCH: Normally interactive.   Laboratory and Imaging Data:  Assessment and Plan:   ***

## 2023-01-31 ENCOUNTER — Ambulatory Visit: Payer: Medicare Other | Admitting: Family Medicine

## 2023-02-04 ENCOUNTER — Ambulatory Visit: Payer: Medicare Other | Admitting: Podiatry

## 2023-02-04 DIAGNOSIS — M25512 Pain in left shoulder: Secondary | ICD-10-CM | POA: Diagnosis not present

## 2023-02-05 ENCOUNTER — Other Ambulatory Visit: Payer: Self-pay | Admitting: Family Medicine

## 2023-02-05 DIAGNOSIS — E1142 Type 2 diabetes mellitus with diabetic polyneuropathy: Secondary | ICD-10-CM

## 2023-02-10 ENCOUNTER — Other Ambulatory Visit: Payer: Self-pay | Admitting: Family Medicine

## 2023-02-10 DIAGNOSIS — E1142 Type 2 diabetes mellitus with diabetic polyneuropathy: Secondary | ICD-10-CM

## 2023-02-11 ENCOUNTER — Ambulatory Visit (INDEPENDENT_AMBULATORY_CARE_PROVIDER_SITE_OTHER): Payer: Medicare Other | Admitting: Family Medicine

## 2023-02-11 ENCOUNTER — Encounter: Payer: Self-pay | Admitting: Family Medicine

## 2023-02-11 VITALS — BP 160/80 | HR 68 | Temp 98.0°F | Ht 62.0 in | Wt 171.5 lb

## 2023-02-11 DIAGNOSIS — G6289 Other specified polyneuropathies: Secondary | ICD-10-CM

## 2023-02-11 DIAGNOSIS — M2550 Pain in unspecified joint: Secondary | ICD-10-CM | POA: Diagnosis not present

## 2023-02-11 DIAGNOSIS — H401133 Primary open-angle glaucoma, bilateral, severe stage: Secondary | ICD-10-CM | POA: Diagnosis not present

## 2023-02-11 DIAGNOSIS — W19XXXA Unspecified fall, initial encounter: Secondary | ICD-10-CM

## 2023-02-11 DIAGNOSIS — H01005 Unspecified blepharitis left lower eyelid: Secondary | ICD-10-CM

## 2023-02-11 DIAGNOSIS — S46012A Strain of muscle(s) and tendon(s) of the rotator cuff of left shoulder, initial encounter: Secondary | ICD-10-CM | POA: Diagnosis not present

## 2023-02-11 DIAGNOSIS — E1142 Type 2 diabetes mellitus with diabetic polyneuropathy: Secondary | ICD-10-CM

## 2023-02-11 DIAGNOSIS — R011 Cardiac murmur, unspecified: Secondary | ICD-10-CM

## 2023-02-11 LAB — POCT GLYCOSYLATED HEMOGLOBIN (HGB A1C): Hemoglobin A1C: 6.7 % — AB (ref 4.0–5.6)

## 2023-02-11 NOTE — Progress Notes (Unsigned)
Ph: [phone] Fax: [phone]   Patient ID: [name], female    DOB: 02/10/1935, 88 y.o.   MRN: [id number]  This visit was conducted in person.  BP (!) 160/80 Comment: Standing  Pulse 68   Temp 98 F (36.7 C) (Oral)   Ht 5\' 2"  (1.575 m)   Wt 171 lb 8 oz (77.8 kg)   SpO2 98%   BMI 31.37 kg/m   Orthostatic Vitals for the past 48 hrs (Last 6 readings):  Orthostatic BP BP Pulse  02/11/23 1015 -- 132/80 68  02/11/23 1103 148/84 -- --  02/11/23 1106 -- (!) 160/80 --   CC: 4 mo DM f/u visit  Subjective:   HPI: [name] is a 88 y.o. female presenting on 02/11/2023 for Medical Management of Chronic Issues (Here for 4 mo f/u. Pt accompanied by husband, "Homero Fellers" and daughter, Rosey Bath. )   R handed.  Fall at home on 01/27/2023 - injured L shoulder. Saw ortho 01/28/2023 for full thickness rotator cuff tear by MRI. Discussing surgery - RTC repair vs shoulder replacement. Records not available.   Periph neuropathy presumed diabetes related - overall reassuring labwork, except for pos ANA with low titers.   DM - does regularly check sugars, 117 this morning. Compliant with antihyperglycemic regimen which includes: diet controlled. H/o hypoglycemia to oral diabetes medicines. Denies low sugars or hypoglycemic symptoms. Notes occ paresthesias, blurry vision. Last diabetic eye exam DUE - upcoming appt 02/2023. Glucometer brand: one touch ultra 2. Last foot exam: 09/2022. DSME: remotely. Lab Results  Component Value Date   HGBA1C 6.7 (A) 02/11/2023   Diabetic Foot Exam - Simple   No data filed    Lab Results  Component Value Date   MICROALBUR 3.2 (H) 06/04/2022    POAG severe bilateral eyes last saw eye doctor 10/2022.      Relevant past medical, surgical, family and social history reviewed and updated as indicated. Interim medical history since our last visit reviewed. Allergies and medications reviewed and updated. Outpatient Medications Prior to Visit  Medication  Sig Dispense Refill   Ascorbic Acid (VITAMIN C) 1000 MG tablet Take 1 tablet (1,000 mg total) by mouth daily.     atenolol (TENORMIN) 50 MG tablet Take 0.5 tablets (25 mg total) by mouth 2 (two) times daily.     atorvastatin (LIPITOR) 20 MG tablet Take 1 tablet (20 mg total) by mouth once a week. 12 tablet 4   Blood Glucose Monitoring Suppl (ONE TOUCH ULTRA 2) w/Device KIT Use to check sugars daily E11.69 1 kit 0   Cholecalciferol (VITAMIN D) 50 MCG (2000 UT) CAPS Take 1 capsule (2,000 Units total) by mouth daily. 30 capsule    Coenzyme Q10 (COQ10) 100 MG CAPS Take 1 capsule by mouth daily.     cyanocobalamin (V-R VITAMIN B-12) 500 MCG tablet Take 1 tablet (500 mcg total) by mouth daily.     Lancet Device MISC One touch delica - Use as directed 1 each 1   Lancets (ONETOUCH DELICA PLUS LANCET33G) MISC Use as instructed to check blood sugar once a day 100 each 3   Lancets Misc. (ACCU-CHEK SOFTCLIX LANCET DEV) KIT Use at home to test blood sugars daily. 1 kit 0   levothyroxine (SYNTHROID) 100 MCG tablet Take 1 tablet (100 mcg total) by mouth daily. 90 tablet 4   montelukast (SINGULAIR) 10 MG tablet TAKE 1 TABLET BY MOUTH DAILY 100 tablet 2   omeprazole (PRILOSEC) 40 MG capsule Take 1  capsule (40 mg total) by mouth every Monday, Wednesday, and Friday. 40 capsule 4   ONETOUCH ULTRA test strip USE TO CHECK BLOOD SUGAR ONCE DAILY 100 strip 4   erythromycin ophthalmic ointment Place a 1/2 inch ribbon of ointment into the lower eyelid twice daily for a week. 3.5 g 0   No facility-administered medications prior to visit.     Per HPI unless specifically indicated in ROS section below Review of Systems  Objective:  BP (!) 160/80 Comment: Standing  Pulse 68   Temp 98 F (36.7 C) (Oral)   Ht 5\' 2"  (1.575 m)   Wt 171 lb 8 oz (77.8 kg)   SpO2 98%   BMI 31.37 kg/m   Wt Readings from Last 3 Encounters:  02/11/23 171 lb 8 oz (77.8 kg)  01/27/23 164 lb (74.4 kg)  11/21/22 164 lb (74.4 kg)       Physical Exam Vitals and nursing note reviewed.  Constitutional:      Appearance: Normal appearance. She is not ill-appearing.  HENT:     Head: Normocephalic and atraumatic.     Mouth/Throat:     Mouth: Mucous membranes are moist.     Pharynx: Oropharynx is clear. No oropharyngeal exudate or posterior oropharyngeal erythema.  Eyes:     General:        Right eye: No discharge.        Left eye: No discharge.     Extraocular Movements: Extraocular movements intact.     Conjunctiva/sclera:     Left eye: Left conjunctiva is injected.     Pupils: Pupils are equal, round, and reactive to light.     Comments: Erythematous inflamed palpebral conjunctiva to left lower eyelid  Neck:     Vascular: No carotid bruit.  Cardiovascular:     Rate and Rhythm: Normal rate and regular rhythm.     Pulses: Normal pulses.     Heart sounds: Murmur (3/6 systolic USB) heard.  Pulmonary:     Effort: Pulmonary effort is normal. No respiratory distress.     Breath sounds: Normal breath sounds. No wheezing, rhonchi or rales.  Musculoskeletal:     Right lower leg: No edema.     Left lower leg: No edema.  Skin:    General: Skin is warm and dry.     Findings: No rash.  Neurological:     Mental Status: She is alert.  Psychiatric:        Mood and Affect: Mood normal.        Behavior: Behavior normal.       Results for orders placed or performed in visit on 02/11/23  POCT glycosylated hemoglobin (Hb A1C)   Collection Time: 02/11/23 10:30 AM  Result Value Ref Range   Hemoglobin A1C 6.7 (A) 4.0 - 5.6 %   HbA1c POC (<> result, manual entry)     HbA1c, POC (prediabetic range)     HbA1c, POC (controlled diabetic range)     Lab Results  Component Value Date   VD25OH 36.32 06/04/2022   Assessment & Plan:   Problem List Items Addressed This Visit     Type 2 diabetes, controlled, with peripheral neuropathy (HCC) - Primary   Chronic, diet controlled with A1c 6.7%.  Continue to encourage low sugar low  carb diabetic diet.  She has upcoming diabetic eye exam scheduled through her glaucoma specialist.       Relevant Orders   POCT glycosylated hemoglobin (Hb A1C) (Completed)   POAG (primary  open-angle glaucoma)   Severe, followed by [facility] glaucoma specialist.       Polyarthralgia   Presumed OA related - discussed OTC supplements which may help - trial turmeric, glucosamine, continue vitamin D 2000 international units  replacement.       Peripheral neuropathy   Labs largely unrevealing. Low positive ANA ?false positive.  ?diabetes related neuropathy.  Not currently on medication.       Traumatic complete tear of left rotator cuff   Injured L shoulder several weeks ago s/p ortho eval with MRI showing full thickness tear. Discussing surgical repair vs shoulder replacement (Poggi).       Fall with injury   Had fall several weeks ago with resultant L shoulder injury as per above. Check orthostatic vital signs to eval for orthostatic hypotension/dizziness as contributor - normal orthostatics today.       Blepharitis of left lower eyelid   Exam consistent with inflammation to left lower eyelid - erythromycin ointment was partially helpful. Recommend lubricating eye drops until she sees her eye doctor in a few weeks, update if worsening.      Systolic murmur   Mild MR on echo 2021. Consider updated echo.         No orders of the defined types were placed in this encounter.   Orders Placed This Encounter  Procedures   POCT glycosylated hemoglobin (Hb A1C)    Patient Instructions  Orthostatic vital signs today  [place] gotas lubricantes para los ojos.  Pida que manden reporte de examen diabetico de ojos a mi oficina - fax# [phone] Regresar en 4 meses para examen fisico (despues de 06/12/2023).   Turmeric 500mg  1-2 veces al dia.  Glucosamina suplemento para articulacionces.   Follow up plan: Return in about 4 months (around 06/12/2023), or if symptoms worsen or fail to  improve, for annual exam, prior fasting for blood work, medicare wellness visit.  Eustaquio Boyden, MD

## 2023-02-11 NOTE — Patient Instructions (Addendum)
Orthostatic vital signs today  [place] gotas lubricantes para los ojos.  Pida que manden reporte de examen diabetico de ojos a mi oficina - fax# [phone] Regresar en 4 meses para examen fisico (despues de 06/12/2023).   Turmeric 500mg  1-2 veces al dia.  Glucosamina suplemento para articulacionces.

## 2023-02-12 ENCOUNTER — Encounter: Payer: Self-pay | Admitting: Family Medicine

## 2023-02-12 DIAGNOSIS — S46012A Strain of muscle(s) and tendon(s) of the rotator cuff of left shoulder, initial encounter: Secondary | ICD-10-CM | POA: Insufficient documentation

## 2023-02-12 DIAGNOSIS — R011 Cardiac murmur, unspecified: Secondary | ICD-10-CM | POA: Insufficient documentation

## 2023-02-12 DIAGNOSIS — H01005 Unspecified blepharitis left lower eyelid: Secondary | ICD-10-CM | POA: Insufficient documentation

## 2023-02-12 DIAGNOSIS — W19XXXA Unspecified fall, initial encounter: Secondary | ICD-10-CM | POA: Insufficient documentation

## 2023-02-12 NOTE — Assessment & Plan Note (Addendum)
Presumed OA related - discussed OTC supplements which may help - trial turmeric, glucosamine, continue vitamin D 2000 international units  replacement.

## 2023-02-12 NOTE — Assessment & Plan Note (Signed)
Had fall several weeks ago with resultant L shoulder injury as per above. Check orthostatic vital signs to eval for orthostatic hypotension/dizziness as contributor - normal orthostatics today.

## 2023-02-12 NOTE — Assessment & Plan Note (Addendum)
Injured L shoulder several weeks ago s/p ortho eval with MRI showing full thickness tear. Discussing surgical repair vs shoulder replacement (Poggi).

## 2023-02-12 NOTE — Assessment & Plan Note (Addendum)
Severe, followed by [facility] glaucoma specialist.

## 2023-02-12 NOTE — Assessment & Plan Note (Addendum)
Labs largely unrevealing. Low positive ANA ?false positive.  ?diabetes related neuropathy.  Not currently on medication.

## 2023-02-12 NOTE — Assessment & Plan Note (Signed)
Exam consistent with inflammation to left lower eyelid - erythromycin ointment was partially helpful. Recommend lubricating eye drops until she sees her eye doctor in a few weeks, update if worsening.

## 2023-02-12 NOTE — Assessment & Plan Note (Addendum)
Chronic, diet controlled with A1c 6.7%.  Continue to encourage low sugar low carb diabetic diet.  She has upcoming diabetic eye exam scheduled through her glaucoma specialist.

## 2023-02-12 NOTE — Assessment & Plan Note (Signed)
Mild MR on echo 2021. Consider updated echo.

## 2023-02-16 ENCOUNTER — Other Ambulatory Visit: Payer: Self-pay | Admitting: Family Medicine

## 2023-02-16 NOTE — Progress Notes (Signed)
 Jinan Biggins T. Kerryn Tennant, MD, CAQ Sports Medicine [facility] at [facility] [address] [place], [address]  Phone: [phone]  FAX: [phone]  [name] - 89 y.o. female  MRN [id number]  Date of Birth: 02-Oct-1934  Date: 02/17/2023  PCP: Rilla Baller, MD  Referral: Rilla Baller, MD  Chief Complaint  Patient presents with   Shoulder Pain    Right Shoulder   Subjective:   DALANIE KISNER is a 89 y.o. very pleasant female patient with Body mass index is 31.28 kg/m. who presents with the following:  Patient presents for shoulder follow-up.  She recently saw Dr. Marchia, and she has a full-thickness tear of the supraspinatus, infraspinatus and a partial-thickness tear of the subscapularis.  At this point, they are contemplating reverse shoulder arthroplasty.  She is a very pleasant patient, I recall her well from the past along with her husband.  We did go over her MRI findings of the left shoulder and had some discussion regarding surgery including reverse shoulder arthroplasty.  Dr. Marchia did recommend follow-up with shoulder replacement surgeon, and she has the name of Dr. Edie.  She also does have right-sided chronic shoulder pain.  She does have some relatively mild arthritis of the glenohumeral joint.  She does have some elevation of the humeral head which can go along with chronic rotator cuff tear on the right side, as well.  At this point, she is planning to have surgery, but she would like to have some pain relief with the right shoulder for now.  Review of Systems is noted in the HPI, as appropriate  Objective:   BP (!) 170/90 (BP Location: Right Arm, Patient Position: Sitting, Cuff Size: Large)   Pulse 66   Temp 97.9 F (36.6 C) (Temporal)   Ht 5' 2 (1.575 m)   Wt 171 lb (77.6 kg)   SpO2 97%   BMI 31.28 kg/m   GEN: No acute distress; alert,appropriate. PULM: Breathing comfortably in no respiratory  distress PSYCH: Normally interactive.   Left shoulder is fairly limited and able to abduct the shoulder to about 50 degrees actively.  She has markedly decreased strength in essentially all directions of the left shoulder.  Right shoulder, she is able to abduct to about 100 degrees with increased passive motion to 160.  Passive flexion to 160. She does have notable crepitus with movement of the right shoulder She does have pain with abduction, flexion, and external range of motion. Strength is 3+/5 in abduction 3+/5 in flexion 4/5 in internal range of motion as well as normal/5 in subscapularis stress. Knox Vonzell Berber, Neer testing are all positive.  Laboratory and Imaging Data:  Assessment and Plan:     ICD-10-CM   1. Chronic right shoulder pain  M25.511 triamcinolone  acetonide (KENALOG -40) injection 40 mg   G89.29     2. Glenohumeral arthritis, right  M19.011     3. Traumatic complete tear of left rotator cuff, subsequent encounter  S46.012D      Total encounter time: 30 minutes. This includes total time spent on the day of encounter.  We did take some additional time to review her left shoulder injury, severe rotator cuff tear with retraction, and I tried to talk to him a bit about reverse shoulder arthroplasty.  At this point she and her family are planning on pursuing this course of action, and I think that is entirely reasonable.  She does have chronic right-sided pain, some arthritis, and  she very well may have a chronic right-sided rotator cuff tear, as well.  I think it would be interesting to see how she does rehabbing after her left shoulder surgery and ideally her right shoulder will improve with additional conservative care.  For now, I am going to do an intra-articular shoulder injection on the right side to try to give her some pain management.  Intraarticular Shoulder Aspiration/Injection Procedure Note ABBEE CREMEENS [birth year] Date of procedure:  02/17/2023  Procedure: Large Joint Aspiration / Injection of Shoulder, Intraarticular, R Indications: Pain  Procedure Details Verbal consent was obtained from the patient. Risks explained and contrasted with benefits and alternatives. Patient prepped with Chloraprep and Ethyl Chloride used for anesthesia. An intraarticular shoulder injection was performed using the posterior approach; needle placed into joint capsule without difficulty. The patient tolerated the procedure well and had decreased pain post injection. No complications. Injection: 9 cc of Lidocaine  1% and 1 mL Kenalog  40 mg. Needle: 21 gauge, 2 inch   Medication Management during today's office visit: Meds ordered this encounter  Medications   triamcinolone  acetonide (KENALOG -40) injection 40 mg   There are no discontinued medications.  Orders placed today for conditions managed today: No orders of the defined types were placed in this encounter.   Disposition: No follow-ups on file.  [employer] One speech-to-text software was used for transcription in this dictation.  Possible transcriptional errors can occur using Animal nutritionist.   Signed,  Jacques DASEN. Hailee Hollick, MD   Outpatient Encounter Medications as of 02/17/2023  Medication Sig   Ascorbic Acid  (VITAMIN C ) 1000 MG tablet Take 1 tablet (1,000 mg total) by mouth daily.   atenolol  (TENORMIN ) 50 MG tablet Take 0.5 tablets (25 mg total) by mouth 2 (two) times daily.   atorvastatin  (LIPITOR) 20 MG tablet Take 1 tablet (20 mg total) by mouth once a week.   Blood Glucose Monitoring Suppl (ONE TOUCH ULTRA 2) w/Device KIT Use to check sugars daily E11.69   Cholecalciferol (VITAMIN D ) 50 MCG (2000 UT) CAPS Take 1 capsule (2,000 Units total) by mouth daily.   Coenzyme Q10 (COQ10) 100 MG CAPS Take 1 capsule by mouth daily.   cyanocobalamin  (V-R VITAMIN B-12) 500 MCG tablet Take 1 tablet (500 mcg total) by mouth daily.   Lancet Device MISC One touch delica - Use as  directed   Lancets (ONETOUCH DELICA PLUS LANCET33G) MISC Use as instructed to check blood sugar once a day   Lancets Misc. (ACCU-CHEK SOFTCLIX LANCET DEV) KIT Use at home to test blood sugars daily.   levothyroxine  (SYNTHROID ) 100 MCG tablet Take 1 tablet (100 mcg total) by mouth daily.   montelukast  (SINGULAIR ) 10 MG tablet TAKE 1 TABLET BY MOUTH DAILY   omeprazole  (PRILOSEC) 40 MG capsule Take 1 capsule (40 mg total) by mouth every Monday, Wednesday, and Friday.   ONETOUCH ULTRA test strip USE TO CHECK BLOOD SUGAR ONCE DAILY   [DISCONTINUED] montelukast  (SINGULAIR ) 10 MG tablet TAKE 1 TABLET BY MOUTH DAILY   [EXPIRED] triamcinolone  acetonide (KENALOG -40) injection 40 mg    No facility-administered encounter medications on file as of 02/17/2023.

## 2023-02-17 ENCOUNTER — Encounter: Payer: Self-pay | Admitting: Family Medicine

## 2023-02-17 ENCOUNTER — Ambulatory Visit (INDEPENDENT_AMBULATORY_CARE_PROVIDER_SITE_OTHER): Payer: Medicare Other | Admitting: Family Medicine

## 2023-02-17 VITALS — BP 170/90 | HR 66 | Temp 97.9°F | Ht 62.0 in | Wt 171.0 lb

## 2023-02-17 DIAGNOSIS — G8929 Other chronic pain: Secondary | ICD-10-CM

## 2023-02-17 DIAGNOSIS — S46012D Strain of muscle(s) and tendon(s) of the rotator cuff of left shoulder, subsequent encounter: Secondary | ICD-10-CM | POA: Diagnosis not present

## 2023-02-17 DIAGNOSIS — M25511 Pain in right shoulder: Secondary | ICD-10-CM

## 2023-02-17 DIAGNOSIS — M19011 Primary osteoarthritis, right shoulder: Secondary | ICD-10-CM

## 2023-02-17 MED ORDER — TRIAMCINOLONE ACETONIDE 40 MG/ML IJ SUSP
40.0000 mg | Freq: Once | INTRAMUSCULAR | Status: AC
  Administered 2023-02-17: 40 mg via INTRA_ARTICULAR

## 2023-02-24 DIAGNOSIS — H401133 Primary open-angle glaucoma, bilateral, severe stage: Secondary | ICD-10-CM | POA: Diagnosis not present

## 2023-02-24 DIAGNOSIS — E1142 Type 2 diabetes mellitus with diabetic polyneuropathy: Secondary | ICD-10-CM | POA: Diagnosis not present

## 2023-03-09 ENCOUNTER — Other Ambulatory Visit (HOSPITAL_COMMUNITY)
Admission: RE | Admit: 2023-03-09 | Discharge: 2023-03-09 | Disposition: A | Discharge status: Home / Self Care | Payer: Medicare Other | Source: Ambulatory Visit | Attending: Certified Nurse Midwife | Admitting: Certified Nurse Midwife

## 2023-03-09 ENCOUNTER — Encounter: Payer: Self-pay | Admitting: Certified Nurse Midwife

## 2023-03-09 ENCOUNTER — Ambulatory Visit: Payer: Medicare Other | Admitting: Certified Nurse Midwife

## 2023-03-09 VITALS — BP 143/73 | HR 60 | Ht 62.0 in | Wt 165.0 lb

## 2023-03-09 DIAGNOSIS — R399 Unspecified symptoms and signs involving the genitourinary system: Secondary | ICD-10-CM | POA: Insufficient documentation

## 2023-03-09 DIAGNOSIS — N950 Postmenopausal bleeding: Secondary | ICD-10-CM | POA: Insufficient documentation

## 2023-03-09 LAB — POCT URINALYSIS DIPSTICK
Bilirubin, UA: NEGATIVE
Glucose, UA: NEGATIVE
Ketones, UA: NEGATIVE
Leukocytes, UA: NEGATIVE
Nitrite, UA: NEGATIVE
Protein, UA: POSITIVE — AB
Spec Grav, UA: 1.01 (ref 1.010–1.025)
Urobilinogen, UA: 0.2 U/dL
pH, UA: 5 (ref 5.0–8.0)

## 2023-03-09 LAB — CERVICOVAGINAL ANCILLARY ONLY
Bacterial Vaginitis (gardnerella): NEGATIVE
Candida Glabrata: NEGATIVE
Candida Vaginitis: NEGATIVE
Comment: NEGATIVE
Comment: NEGATIVE
Comment: NEGATIVE

## 2023-03-09 MED ORDER — ESTRADIOL 0.1 MG/GM VA CREA: 1.0000 | TOPICAL_CREAM | Freq: Every day | VAGINAL | 1 refills | Status: DC

## 2023-03-09 NOTE — Progress Notes (Signed)
Eustaquio Boyden, MD   Chief Complaint  Patient presents with   Vaginal Bleeding    Light flow, pelvic pain (a 5),     HPI:      [name] is a 89 y.o. G3P3000 whose LMP was No LMP recorded. Patient is postmenopausal., presents today for vaginal bleeding. She first noticed spotting off and on in November, but for the last ten days has had to change a pantiliner multiple times a day for bright red bleeding. She passed one stringy clot today. Denies odor. Endorses frequency and urgency of urination at times. She reports pelvic pain that comes and goes and sometimes feels as if she needs to have a BM but BM does not relieve. It feels similar to history of menstrual pain and has been coming and going for a "long time" and she is uncertain when it first started.    Patient Active Problem List   Diagnosis Date Noted   Traumatic complete tear of left rotator cuff 02/12/2023   Fall with injury 02/12/2023   Blepharitis of left lower eyelid 02/12/2023   Systolic murmur 02/12/2023   Chronic fatigue 10/11/2022   Peripheral neuropathy 10/11/2022   Chronic insomnia 06/11/2022   Acute pain of left shoulder 05/10/2022   Orthostatic hypotension 04/04/2022   Closed fracture of left inferior pubic ramus (HCC) 04/03/2022   Lumbar transverse process fracture, closed, initial encounter (HCC) - Right L4, L5 04/03/2022   Traumatic hematoma - right psoas 04/03/2022   Chronic right shoulder pain 02/10/2022   Chronic venous insufficiency of lower extremity 10/12/2021   Chronic pain of both knees 10/12/2021   Memory difficulty 06/11/2021   Vertigo 06/06/2020   Bilateral hearing loss 04/03/2020   Syncope and collapse 11/14/2019   Non-restorative sleep 11/14/2019   Rosacea 11/14/2019   Varicose veins of right lower extremity with inflammation 02/15/2019   Lumbar back pain with radiculopathy affecting right lower extremity 02/02/2018   Chronic cough 09/20/2017   Lesion of lip 05/19/2017    Lactose intolerance 04/21/2017   Fatty liver disease, nonalcoholic 01/27/2017   GERD (gastroesophageal reflux disease) 11/26/2016   Low vitamin B12 level 06/24/2016   Thoracic radiculitis 06/04/2016   Advanced care planning/counseling discussion 12/25/2015   Polyarthralgia 04/17/2015   Health maintenance examination 01/02/2015   Snoring 01/02/2015   Leg cramps 12/27/2013   Skin growth 09/25/2013   Allergic rhinitis 03/23/2013   History of Helicobacter pylori infection 01/15/2013   Medicare annual wellness visit, subsequent 09/14/2012   Osteopenia 06/01/2012   Essential hypertension 11/29/2011   POAG (primary open-angle glaucoma) 11/29/2011   Type 2 diabetes, controlled, with peripheral neuropathy (HCC) 07/22/2011   Hypothyroidism 07/22/2011    Past Surgical History:  Procedure Laterality Date   APPENDECTOMY  1998   BUNIONECTOMY     CHOLECYSTECTOMY     COLONOSCOPY  2007   int hem, poor bowel prep, normal barium enema in following (Competi[facility])   COLONOSCOPY WITH PROPOFOL N/A 07/22/2016   diverticulosis (Wohl, Darren, MD)   EYE SURGERY     R  eye-lid drop surgery   TONSILLECTOMY      Family History  Problem Relation Age of Onset   Heart disease Mother    Asthma Mother    Heart attack Mother 45       aneurysm ruptured by heart   Heart disease Father    Deep vein thrombosis Father    Leukemia Brother    Cancer Brother    Diabetes Sister  Crohn's disease Brother    Diabetes Brother    Arthritis Other    Diabetes Other    Cancer Maternal Aunt        ovarian?   Cancer Maternal Grandfather        colon   Breast cancer Neg Hx     Social History   Socioeconomic History   Marital status: Married    Spouse name: Not on file   Number of children: Not on file   Years of education: Not on file   Highest education level: Not on file  Occupational History   Not on file  Tobacco Use   Smoking status: Never   Smokeless tobacco: Never  Vaping Use   Vaping status:  Never Used  Substance and Sexual Activity   Alcohol use: Not Currently    Alcohol/week: 0.0 standard drinks of alcohol    Comment: occasional   Drug use: No   Sexual activity: Not Currently    Birth control/protection: Post-menopausal  Other Topics Concern   Not on file  Social History Narrative   Lives in [place], from [place]. Daughter lives here. 4 daughters.   From [place]   Work - retired Agricultural engineer   Diet - regular   Exercise - bike daily at home   Social Drivers of [employer]: Low Risk  (06/08/2022)   Overall Financial Resource Strain (CARDIA)    Difficulty of Paying Living Expenses: Not hard at all  Food Insecurity: No Food Insecurity (06/08/2022)   Hunger Vital Sign    Worried About Running Out of Food in the Last Year: Never true    Ran Out of Food in the Last Year: Never true  Transportation Needs: No Transportation Needs (06/08/2022)   PRAPARE - Administrator, Civil Service (Medical): No    Lack of Transportation (Non-Medical): No  Physical Activity: Sufficiently Active (06/08/2022)   Exercise Vital Sign    Days of Exercise per Week: 7 days    Minutes of Exercise per Session: 30 min  Stress: No Stress Concern Present (06/08/2022)   [employer] of Occupational Health - Occupational Stress Questionnaire    Feeling of Stress : Not at all  Social Connections: Moderately Isolated (06/08/2022)   Social Connection and Isolation Panel [NHANES]    Frequency of Communication with Friends and Family: More than three times a week    Frequency of Social Gatherings with Friends and Family: More than three times a week    Attends Religious Services: Never    Database administrator or Organizations: No    Attends Banker Meetings: Never    Marital Status: Married  Catering manager Violence: Not At Risk (06/08/2022)   Humiliation, Afraid, Rape, and Kick questionnaire    Fear of Current or Ex-Partner: No    Emotionally Abused: No     Physically Abused: No    Sexually Abused: No    Outpatient Medications Prior to Visit  Medication Sig Dispense Refill   Ascorbic Acid (VITAMIN C) 1000 MG tablet Take 1 tablet (1,000 mg total) by mouth daily.     atenolol (TENORMIN) 50 MG tablet Take 0.5 tablets (25 mg total) by mouth 2 (two) times daily.     atorvastatin (LIPITOR) 20 MG tablet Take 1 tablet (20 mg total) by mouth once a week. 12 tablet 4   Blood Glucose Monitoring Suppl (ONE TOUCH ULTRA 2) w/Device KIT Use to check sugars daily E11.69 1 kit 0  Cholecalciferol (VITAMIN D) 50 MCG (2000 UT) CAPS Take 1 capsule (2,000 Units total) by mouth daily. 30 capsule    Coenzyme Q10 (COQ10) 100 MG CAPS Take 1 capsule by mouth daily.     cyanocobalamin (V-R VITAMIN B-12) 500 MCG tablet Take 1 tablet (500 mcg total) by mouth daily.     Lancet Device MISC One touch delica - Use as directed 1 each 1   Lancets (ONETOUCH DELICA PLUS LANCET33G) MISC Use as instructed to check blood sugar once a day 100 each 3   Lancets Misc. (ACCU-CHEK SOFTCLIX LANCET DEV) KIT Use at home to test blood sugars daily. 1 kit 0   levothyroxine (SYNTHROID) 100 MCG tablet Take 1 tablet (100 mcg total) by mouth daily. 90 tablet 4   montelukast (SINGULAIR) 10 MG tablet TAKE 1 TABLET BY MOUTH DAILY 100 tablet 2   omeprazole (PRILOSEC) 40 MG capsule Take 1 capsule (40 mg total) by mouth every Monday, Wednesday, and Friday. 40 capsule 4   ONETOUCH ULTRA test strip USE TO CHECK BLOOD SUGAR ONCE DAILY 100 strip 4   No facility-administered medications prior to visit.      ROS:  Review of Systems  Constitutional: Negative.   Respiratory: Negative.    Cardiovascular: Negative.   Genitourinary:  Positive for pelvic pain and vaginal bleeding.     OBJECTIVE:   Vitals:  BP (!) 143/73   Pulse 60   Ht 5\' 2"  (1.575 m)   Wt 165 lb (74.8 kg)   BMI 30.18 kg/m   Physical Exam Constitutional:      Appearance: Normal appearance.  Cardiovascular:     Rate and  Rhythm: Normal rate.  Pulmonary:     Effort: Pulmonary effort is normal.  Genitourinary:    Comments: NEFG, atrophy consistent with postmenopausal changes. Speculum exam difficult due to redundant vaginal tissue as well as rectocele obscuring view of vaginal vault. Unable to visualize cervix. Bimanual exam with posterior & high cervix palpated. No clots visualized, small amount BRB in vaginal vault. Aptima collected. Neurological:     General: No focal deficit present.     Mental Status: She is alert and oriented to person, place, and time.  Psychiatric:        Mood and Affect: Mood normal.        Behavior: Behavior normal.     Results: Results for orders placed or performed in visit on 03/09/23 (from the past 24 hours)  POCT Urinalysis Dipstick     Status: Abnormal   Collection Time: 03/09/23 11:13 AM  Result Value Ref Range   Color, UA     Clarity, UA     Glucose, UA Negative Negative   Bilirubin, UA neg    Ketones, UA neg    Spec Grav, UA 1.010 1.010 - 1.025   Blood, UA 3+    pH, UA 5.0 5.0 - 8.0   Protein, UA Positive (A) Negative   Urobilinogen, UA 0.2 0.2 or 1.0 E.U./dL   Nitrite, UA neg    Leukocytes, UA Negative Negative   Appearance     Odor       Assessment/Plan: Post-menopausal bleeding - Plan: Cervicovaginal ancillary only, US PELVIC COMPLETE WITH TRANSVAGINAL, CANCELED: US PELVIS (TRANSABDOMINAL ONLY), CANCELED: US PELVIS TRANSVAGINAL NON-OB (TV ONLY)  UTI symptoms - Plan: POCT Urinalysis Dipstick, Urine Culture, Urinalysis, Routine w reflex microscopic, Cervicovaginal ancillary only  Discussed patient history & exam with Dr. Lonny Prude, recommends pelvic ultrasound to assess for endometrial thickness. Start Estrace at  bedtime to improve ability to visualize cervix on speculum exam, patient prepared for possibility of endometrial biopsy dependent on ultrasound results. Differentials of benign growth vs possibility of cancer on further investigation reviewed.  Meds  ordered this encounter  Medications   estradiol (ESTRACE VAGINAL) 0.1 MG/GM vaginal cream    Sig: Place 1 Applicatorful vaginally at bedtime for 28 days.    Dispense:  42.5 g    Refill:  1     Dominica Severin, CNM 03/09/2023 11:51 AM

## 2023-03-10 ENCOUNTER — Encounter: Payer: Self-pay | Admitting: Certified Nurse Midwife

## 2023-03-10 LAB — URINALYSIS, ROUTINE W REFLEX MICROSCOPIC
Bilirubin, UA: NEGATIVE
Glucose, UA: NEGATIVE
Ketones, UA: NEGATIVE
Nitrite, UA: NEGATIVE
Specific Gravity, UA: 1.02 (ref 1.005–1.030)
Urobilinogen, Ur: 0.2 mg/dL (ref 0.2–1.0)
pH, UA: 6 (ref 5.0–7.5)

## 2023-03-10 LAB — MICROSCOPIC EXAMINATION
Bacteria, UA: NONE SEEN
Casts: NONE SEEN /[LPF]
RBC, Urine: >30 /[HPF] — AB (ref 0–2)

## 2023-03-11 LAB — URINE CULTURE

## 2023-03-14 DIAGNOSIS — M12812 Other specific arthropathies, not elsewhere classified, left shoulder: Secondary | ICD-10-CM | POA: Diagnosis not present

## 2023-03-14 DIAGNOSIS — M25512 Pain in left shoulder: Secondary | ICD-10-CM | POA: Diagnosis not present

## 2023-03-14 DIAGNOSIS — M7582 Other shoulder lesions, left shoulder: Secondary | ICD-10-CM | POA: Diagnosis not present

## 2023-03-14 DIAGNOSIS — M75122 Complete rotator cuff tear or rupture of left shoulder, not specified as traumatic: Secondary | ICD-10-CM | POA: Diagnosis not present

## 2023-03-17 ENCOUNTER — Ambulatory Visit
Admission: RE | Admit: 2023-03-17 | Discharge: 2023-03-17 | Disposition: A | Discharge status: Home / Self Care | Payer: Medicare Other | Source: Ambulatory Visit | Attending: Certified Nurse Midwife | Admitting: Certified Nurse Midwife

## 2023-03-17 DIAGNOSIS — N950 Postmenopausal bleeding: Secondary | ICD-10-CM | POA: Diagnosis present

## 2023-03-17 DIAGNOSIS — R9389 Abnormal findings on diagnostic imaging of other specified body structures: Secondary | ICD-10-CM | POA: Diagnosis not present

## 2023-03-21 ENCOUNTER — Ambulatory Visit
Admission: RE | Admit: 2023-03-21 | Discharge: 2023-03-21 | Disposition: A | Discharge status: Home / Self Care | Payer: Medicare Other | Source: Ambulatory Visit | Attending: Family Medicine | Admitting: Family Medicine

## 2023-03-21 DIAGNOSIS — M8589 Other specified disorders of bone density and structure, multiple sites: Secondary | ICD-10-CM | POA: Diagnosis not present

## 2023-03-21 DIAGNOSIS — Z780 Asymptomatic menopausal state: Secondary | ICD-10-CM | POA: Insufficient documentation

## 2023-03-22 ENCOUNTER — Other Ambulatory Visit: Payer: Self-pay | Admitting: Certified Nurse Midwife

## 2023-03-22 DIAGNOSIS — N838 Other noninflammatory disorders of ovary, fallopian tube and broad ligament: Secondary | ICD-10-CM

## 2023-03-22 DIAGNOSIS — N950 Postmenopausal bleeding: Secondary | ICD-10-CM

## 2023-03-24 ENCOUNTER — Telehealth: Payer: Self-pay

## 2023-03-24 NOTE — Telephone Encounter (Signed)
 Pts daughter Alayha Babineaux needs to know if her mother was supposed to use the estradiol  for 1 month or just before the MRV or ultrasound? Pts daughter states the prescription was only enough to last for 4 days. It has one refill but that will only last 4 days. I advised pt that when we get results of the MRV that would tell us  if we needed the endometrial bx. And we would need to see the cervix for that procedure. Please call the Daughter and explain on how to use the cream or send in more. Or let me know what to advise.  [phone] is [facility] number

## 2023-03-25 ENCOUNTER — Other Ambulatory Visit: Payer: Self-pay

## 2023-03-25 ENCOUNTER — Telehealth: Payer: Self-pay

## 2023-03-25 DIAGNOSIS — N950 Postmenopausal bleeding: Secondary | ICD-10-CM

## 2023-03-25 MED ORDER — ESTRADIOL 0.1 MG/GM VA CREA: TOPICAL_CREAM | VAGINAL | 1 refills | Status: DC

## 2023-03-25 NOTE — Telephone Encounter (Signed)
 Patients daughter is calling to state her mother has finished her Rx Estradiol  in 4 days. Her prescription was written for 1 applicator rather than 1/4 of an applicator. Patients daughter is very concerned about the excessive estrogen dose as well as her mother now being out of the medication. Discussed I will send in a new rx specifying to use 1/4 applicator rather than a full applicator. Patient has also been reading the side effects regarding her hypertension and diabetes. Please advise on any risks or guidance? If calling, please call daughter Angelee at [phone]

## 2023-03-25 NOTE — Telephone Encounter (Signed)
Pt aware.

## 2023-03-26 NOTE — Telephone Encounter (Signed)
 TC to patient's daughter as requested, reviewed correct medication instructions & apologies offered for incorrect dose as originally ordered. Reviewed indication as well as procedure for biopsy. Will call to make lab appointment for ROMA test.

## 2023-03-30 ENCOUNTER — Other Ambulatory Visit: Payer: Medicare Other

## 2023-03-30 ENCOUNTER — Ambulatory Visit
Admission: RE | Admit: 2023-03-30 | Discharge: 2023-03-30 | Disposition: A | Discharge status: Home / Self Care | Payer: Medicare Other | Source: Ambulatory Visit | Attending: Certified Nurse Midwife | Admitting: Certified Nurse Midwife

## 2023-03-30 DIAGNOSIS — N838 Other noninflammatory disorders of ovary, fallopian tube and broad ligament: Secondary | ICD-10-CM | POA: Insufficient documentation

## 2023-03-30 DIAGNOSIS — N950 Postmenopausal bleeding: Secondary | ICD-10-CM

## 2023-03-30 MED ORDER — GADOBUTROL 1 MMOL/ML IV SOLN
7.5000 mL | Freq: Once | INTRAVENOUS | Status: AC | PRN
  Administered 2023-03-30: 7.5 mL via INTRAVENOUS

## 2023-03-31 LAB — OVARIAN CANCER MONITOR
Cancer Antigen (CA) 125: 7.6 U/mL (ref 0.0–38.1)
HE4: 73.7 pmol/L (ref 0.0–96.9)

## 2023-04-01 ENCOUNTER — Encounter: Payer: Self-pay | Admitting: Certified Nurse Midwife

## 2023-04-05 ENCOUNTER — Encounter: Payer: Self-pay | Admitting: Obstetrics and Gynecology

## 2023-04-05 ENCOUNTER — Other Ambulatory Visit (HOSPITAL_COMMUNITY)
Admission: RE | Admit: 2023-04-05 | Discharge: 2023-04-05 | Disposition: A | Discharge status: Home / Self Care | Payer: Medicare Other | Source: Ambulatory Visit | Attending: Obstetrics and Gynecology | Admitting: Obstetrics and Gynecology

## 2023-04-05 ENCOUNTER — Ambulatory Visit: Payer: Medicare Other | Admitting: Obstetrics and Gynecology

## 2023-04-05 VITALS — BP 154/84 | HR 59 | Ht 62.0 in | Wt 168.9 lb

## 2023-04-05 DIAGNOSIS — R9389 Abnormal findings on diagnostic imaging of other specified body structures: Secondary | ICD-10-CM | POA: Diagnosis not present

## 2023-04-05 DIAGNOSIS — N838 Other noninflammatory disorders of ovary, fallopian tube and broad ligament: Secondary | ICD-10-CM | POA: Diagnosis not present

## 2023-04-05 DIAGNOSIS — N950 Postmenopausal bleeding: Secondary | ICD-10-CM
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
## 2023-04-05 NOTE — Progress Notes (Signed)
Patient presents today for an endometrial biopsy due to PMB and thickened endometrium. States no longer bleeding and using nightly vaginal estrogen.

## 2023-04-06 NOTE — Progress Notes (Signed)
Results reviewed by Dr. Logan Bores. Follow-up scheduled for 3/6 to discuss biopsy results.

## 2023-04-08 LAB — SURGICAL PATHOLOGY

## 2023-04-11 ENCOUNTER — Other Ambulatory Visit: Payer: Self-pay | Admitting: Family Medicine

## 2023-04-11 DIAGNOSIS — E1142 Type 2 diabetes mellitus with diabetic polyneuropathy: Secondary | ICD-10-CM

## 2023-04-12 ENCOUNTER — Ambulatory Visit: Payer: Self-pay | Admitting: Family Medicine

## 2023-04-12 NOTE — Telephone Encounter (Signed)
 Copied from CRM [id number]. Topic: Clinical - Red Word Triage >> Apr 12, 2023 11:57 AM Larwance Sachs wrote: Red Word that prompted transfer to Nurse Triage: Patient called in regarding nausea and dizziness. Would like to schedule with provider for tomorrow around 10am if possible  Chief Complaint: Dizziness Symptoms: Nausea Frequency: A few days Pertinent Negatives: Patient denies fainting/falling Disposition: [] ED /[] Urgent Care (no appt availability in office) / [x] Appointment(In office/virtual)/ []  [facility]/ [] Home Care/ [] Refused Recommended Disposition /[] [facility]/ []  Follow-up with PCP Additional Notes: Patient called in to report dizziness that started a few days ago. Patient reported dizziness is accompanied by nausea. Patient stated that the dizziness is exacerbated when she sits or stand too quickly. Patient stated the room feels like it's spinning at times, when she lays down. Patient stated she is able to walk normally, but does report occasional unsteadiness. Patient denied fainting or falling. Patient denied vomiting. Patient denied headache, fever, weakness and sudden vision changes. Patient stated her BP this morning was 140/77, which she states is normal for her. This RN advised patient to see a provider within 24 hours. No availability with PCP. Scheduled patient with another provider in the office for tomorrow morning. This RN provided home care advice and advised patient to call back if symptoms worsen. Patient complied.   Reason for Disposition  [1] MODERATE dizziness (e.g., interferes with normal activities) AND [2] has NOT been evaluated by doctor (or NP/PA) for this  (Exception: Dizziness caused by heat exposure, sudden standing, or poor fluid intake.)  Answer Assessment - Initial Assessment Questions 1. DESCRIPTION: "Describe your dizziness."     States she feels dizzy when sitting up or standing too quickly 2. LIGHTHEADED: "Do you feel lightheaded?"  (e.g., somewhat faint, woozy, weak upon standing)     Denies passing out/fainting 3. VERTIGO: "Do you feel like either you or the room is spinning or tilting?" (i.e. vertigo)     States room sometimes feels like it's spinning when she lays 4. SEVERITY: "How bad is it?"  "Do you feel like you are going to faint?" "Can you stand and walk?"   - MILD: Feels slightly dizzy, but walking normally.   - MODERATE: Feels unsteady when walking, but not falling; interferes with normal activities (e.g., school, work).   - SEVERE: Unable to walk without falling, or requires assistance to walk without falling; feels like passing out now.      Mild/moderate- states she is still able to walk around, sometimes she needs to hold onto something to keep her balance 5. ONSET:  "When did the dizziness begin?"     A few days ago 6. AGGRAVATING FACTORS: "Does anything make it worse?" (e.g., standing, change in head position)     Sitting and standing up too quickly 8. CAUSE: "What do you think is causing the dizziness?"     Unknown 9. RECURRENT SYMPTOM: "Have you had dizziness before?" If Yes, ask: "When was the last time?" "What happened that time?"     Yes, states it has been a few years 10. OTHER SYMPTOMS: "Do you have any other symptoms?" (e.g., fever, chest pain, vomiting, diarrhea, bleeding)     Nausea, fatigue, denies vomiting, BP 140/77 this morning, BS 142 this morning, denies headache, denies fever, denies weakness, denies sudden vision changes  Protocols used: Dizziness - Lightheadedness-A-AH

## 2023-04-12 NOTE — Telephone Encounter (Signed)
 Noted.

## 2023-04-13 ENCOUNTER — Ambulatory Visit (INDEPENDENT_AMBULATORY_CARE_PROVIDER_SITE_OTHER): Payer: Medicare Other | Admitting: General Practice

## 2023-04-13 ENCOUNTER — Encounter: Payer: Self-pay | Admitting: General Practice

## 2023-04-13 VITALS — BP 112/72 | HR 64 | Temp 98.0°F | Ht 62.0 in | Wt 168.0 lb

## 2023-04-13 DIAGNOSIS — R110 Nausea: Secondary | ICD-10-CM

## 2023-04-13 DIAGNOSIS — R42 Dizziness and giddiness: Secondary | ICD-10-CM | POA: Insufficient documentation

## 2023-04-13 LAB — COMPREHENSIVE METABOLIC PANEL
ALT: 18 U/L (ref 0–35)
AST: 13 U/L (ref 0–37)
Albumin: 4.3 g/dL (ref 3.5–5.2)
Alkaline Phosphatase: 63 U/L (ref 39–117)
BUN: 24 mg/dL — ABNORMAL HIGH (ref 6–23)
CO2: 30 meq/L (ref 19–32)
Calcium: 9.2 mg/dL (ref 8.4–10.5)
Chloride: 102 meq/L (ref 96–112)
Creatinine, Ser: 0.78 mg/dL (ref 0.40–1.20)
GFR: 68.88 mL/min (ref >60.00)
Glucose, Bld: 169 mg/dL — ABNORMAL HIGH (ref 70–99)
Potassium: 4.3 meq/L (ref 3.5–5.1)
Sodium: 138 meq/L (ref 135–145)
Total Bilirubin: 0.7 mg/dL (ref 0.2–1.2)
Total Protein: 6.9 g/dL (ref 6.0–8.3)

## 2023-04-13 LAB — CBC
HCT: 43.6 % (ref 36.0–46.0)
Hemoglobin: 14.1 g/dL (ref 12.0–15.0)
MCHC: 32.4 g/dL (ref 30.0–36.0)
MCV: 94.4 fL (ref 78.0–100.0)
Platelets: 229 10*3/uL (ref 150.0–400.0)
RBC: 4.62 Mil/uL (ref 3.87–5.11)
RDW: 13.9 % (ref 11.5–15.5)
WBC: 6.6 10*3/uL (ref 4.0–10.5)

## 2023-04-13 MED ORDER — ONDANSETRON 4 MG PO TBDP: 4.0000 mg | ORAL_TABLET | Freq: Three times a day (TID) | ORAL | 0 refills | Status: DC | PRN

## 2023-04-13 MED ORDER — MECLIZINE HCL 25 MG PO TABS: 25.0000 mg | ORAL_TABLET | Freq: Three times a day (TID) | ORAL | 0 refills | Status: DC | PRN

## 2023-04-13 NOTE — Assessment & Plan Note (Signed)
 Neuro exam stable.  Symptoms suggestive of vertigo.   Start meclizine 25 mg TID as needed.

## 2023-04-13 NOTE — Assessment & Plan Note (Addendum)
 Exam stable.  Likely secondary to vertigo.   History of fatty liver and recent tylenol use could have triggered as well. Discussed at length about use of tylenol and given written instructions.   Rx sent for ondansetron.  CMP pending.
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
## 2023-04-13 NOTE — Patient Instructions (Addendum)
 Stop by the lab prior to leaving today. I will notify you of your results once received.   Start Meclizine 25 mg three times a day as needed. This is for dizziness.  Start ondansetron 4 mg once every 8 hour as needed. This is nausea.  As discussed please only take 650 mg of tylenol twice a day as needed for pain. Do not increase more than 2000 mg in 24 hours.  As discussed, please go to the ER if you develop one sided body weakness, slurred speech, chest pain, shortness of breath, difficulty breathing, worsening symptoms.  Please schedule follow up with PCP next week if symptoms are not improving.  It was a pleasure meeting you!

## 2023-04-13 NOTE — Assessment & Plan Note (Signed)
 Neuro exam stable. Vital signs stable.   EKG tracing is personally reviewed.  EKG notes NSR.  No acute changes  CBC, CMP pending.

## 2023-04-21 ENCOUNTER — Telehealth: Payer: Self-pay | Admitting: Obstetrics and Gynecology

## 2023-04-21 ENCOUNTER — Ambulatory Visit: Payer: Medicare Other | Admitting: Obstetrics and Gynecology

## 2023-04-21 DIAGNOSIS — N950 Postmenopausal bleeding: Secondary | ICD-10-CM

## 2023-04-21 DIAGNOSIS — R9389 Abnormal findings on diagnostic imaging of other specified body structures: Secondary | ICD-10-CM

## 2023-04-21 NOTE — Telephone Encounter (Signed)
 Contacted the patient via phone x2. Using [place] ID [phone]. We left message for patient to contact the office for rescheduling due to Dr Logan Bores in surgery. Advised the patient to contact the office for rescheduling.

## 2023-04-21 NOTE — Telephone Encounter (Signed)
 The patient rescheduled for 3/20 with Dr Logan Bores

## 2023-05-02 ENCOUNTER — Other Ambulatory Visit: Payer: Self-pay | Admitting: Surgery

## 2023-05-04 ENCOUNTER — Other Ambulatory Visit: Payer: Self-pay

## 2023-05-04 ENCOUNTER — Encounter
Admission: RE | Admit: 2023-05-04 | Discharge: 2023-05-04 | Disposition: A | Discharge status: Home / Self Care | Source: Ambulatory Visit | Attending: Surgery | Admitting: Surgery

## 2023-05-04 DIAGNOSIS — Z01812 Encounter for preprocedural laboratory examination: Secondary | ICD-10-CM | POA: Diagnosis not present

## 2023-05-04 HISTORY — DX: Primary open-angle glaucoma, bilateral, severe stage: H40.1133

## 2023-05-04 HISTORY — DX: Hypothyroidism, unspecified: E03.9

## 2023-05-04 HISTORY — DX: Gastro-esophageal reflux disease without esophagitis: K21.9

## 2023-05-04 HISTORY — DX: Essential (primary) hypertension: I10

## 2023-05-04 HISTORY — DX: Radiculopathy, thoracic region: M54.14

## 2023-05-04 HISTORY — DX: Other specified disorders of bone density and structure, unspecified site: M85.80

## 2023-05-04 HISTORY — DX: Varicose veins of right lower extremity with inflammation: I83.11

## 2023-05-04 LAB — URINALYSIS, ROUTINE W REFLEX MICROSCOPIC
Bilirubin Urine: NEGATIVE
Glucose, UA: NEGATIVE mg/dL
Hgb urine dipstick: NEGATIVE
Ketones, ur: NEGATIVE mg/dL
Leukocytes,Ua: NEGATIVE
Nitrite: NEGATIVE
Protein, ur: NEGATIVE mg/dL
Specific Gravity, Urine: 1.016 (ref 1.005–1.030)
pH: 5 (ref 5.0–8.0)

## 2023-05-04 LAB — SURGICAL PCR SCREEN
MRSA, PCR: NEGATIVE
Staphylococcus aureus: NEGATIVE

## 2023-05-04 NOTE — Patient Instructions (Addendum)
 Your procedure is scheduled on: Thursday, March 27 Report to the Registration Desk on the 1st floor of the [facility] To find out your arrival time, please call [phone] between 1PM - 3PM on: Wednesday, March 26 If your arrival time is 6:00 am, do not arrive before that time as the [facility] entrance doors do not open until 6:00 am.  REMEMBER: Instructions that are not followed completely may result in serious medical risk, up to and including death; or upon the discretion of your surgeon and anesthesiologist your surgery may need to be rescheduled.  Do not eat food after midnight the night before surgery.  No gum chewing or hard candies.  You may however, drink water up to 2 hours before you are scheduled to arrive for your surgery. Do not drink anything within 2 hours of your scheduled arrival time.  In addition, your doctor has ordered for you to drink the provided:  Gatorade G2 Drinking this carbohydrate drink up to two hours before surgery helps to reduce insulin resistance and improve patient outcomes. Please complete drinking 2 hours before scheduled arrival time.  One week prior to surgery: starting March 20 Stop Anti-inflammatories (NSAIDS) such as Advil, Aleve, Ibuprofen, Motrin, Naproxen, Naprosyn and Aspirin based products such as Excedrin, Goody's Powder, BC Powder. Stop ANY OVER THE COUNTER supplements until after surgery. Stop vitamin C, vitamin D, coenzyme Q10, vitamin B.  You may however, continue to take Tylenol if needed for pain up until the day of surgery.  Continue taking all of your other prescription medications up until the day of surgery.  ON THE DAY OF SURGERY ONLY TAKE THESE MEDICATIONS WITH SIPS OF WATER:  atenolol  levothyroxine  omeprazole   No Alcohol for 24 hours before or after surgery.  No Smoking including e-cigarettes for 24 hours before surgery.  No chewable tobacco products for at least 6 hours before surgery.  No nicotine patches  on the day of surgery.  Do not use any "recreational" drugs for at least a week (preferably 2 weeks) before your surgery.  Please be advised that the combination of cocaine and anesthesia may have negative outcomes, up to and including death. If you test positive for cocaine, your surgery will be cancelled.  On the morning of surgery brush your teeth with toothpaste and water, you may rinse your mouth with mouthwash if you wish. Do not swallow any toothpaste or mouthwash.  Use CHG Soap as directed on instruction sheet.  Do not wear jewelry, make-up, hairpins, clips or nail polish.  For welded (permanent) jewelry: bracelets, anklets, waist bands, etc.  Please have this removed prior to surgery.  If it is not removed, there is a chance that hospital personnel will need to cut it off on the day of surgery.  Do not wear lotions, powders, or perfumes.   Do not shave body hair from the neck down 48 hours before surgery.  Contact lenses, hearing aids and dentures may not be worn into surgery.  Do not bring valuables to the hospital. [facility] is not responsible for any missing/lost belongings or valuables.   Total Shoulder Arthroplasty:  use Benzoyl Peroxide 5% Gel as directed on instruction sheet.  Notify your doctor if there is any change in your medical condition (cold, fever, infection).  Wear comfortable clothing (specific to your surgery type) to the hospital.  After surgery, you can help prevent lung complications by doing breathing exercises.  Take deep breaths and cough every 1-2 hours. Your  doctor may order a device called an Incentive Spirometer to help you take deep breaths.  If you are being discharged the day of surgery, you will not be allowed to drive home. You will need a responsible individual to drive you home and stay with you for 24 hours after surgery.   If you are taking public transportation, you will need to have a responsible individual with you.  Please call  the Pre-admissions Testing Dept. at [phone] if you have any questions about these instructions.  Surgery Visitation Policy:  Patients having surgery or a procedure may have two visitors.  Children under the age of 78 must have an adult with them who is not the patient.  Temporary Visitor Restrictions Due to increasing cases of flu, RSV and COVID-19: Children ages 70 and under will not be able to visit patients in [facility] hospitals under most circumstances.     Pre-operative 5 CHG Bath Instructions   You can play a key role in reducing the risk of infection after surgery. Your skin needs to be as free of germs as possible. You can reduce the number of germs on your skin by washing with CHG (chlorhexidine gluconate) soap before surgery. CHG is an antiseptic soap that kills germs and continues to kill germs even after washing.   DO NOT use if you have an allergy to chlorhexidine/CHG or antibacterial soaps. If your skin becomes reddened or irritated, stop using the CHG and notify one of our RNs at [phone].   Please shower with the CHG soap starting 4 days before surgery using the following schedule:     Please keep in mind the following:  DO NOT shave, including legs and underarms, starting the day of your first shower.   You may shave your face at any point before/day of surgery.  Place clean sheets on your bed the day you start using CHG soap. Use a clean washcloth (not used since being washed) for each shower. DO NOT sleep with pets once you start using the CHG.   CHG Shower Instructions:  If you choose to wash your hair and private area, wash first with your normal shampoo/soap.  After you use shampoo/soap, rinse your hair and body thoroughly to remove shampoo/soap residue.  Turn the water OFF and apply about 3 tablespoons (45 ml) of CHG soap to a CLEAN washcloth.  Apply CHG soap ONLY FROM YOUR NECK DOWN TO YOUR TOES (washing for 3-5 minutes)  DO NOT use CHG soap  on face, private areas, open wounds, or sores.  Pay special attention to the area where your surgery is being performed.  If you are having back surgery, having someone wash your back for you may be helpful. Wait 2 minutes after CHG soap is applied, then you may rinse off the CHG soap.  Pat dry with a clean towel  Put on clean clothes/pajamas   If you choose to wear lotion, please use ONLY the CHG-compatible lotions on the back of this paper.     Additional instructions for the day of surgery: DO NOT APPLY any lotions, deodorants, cologne, or perfumes.   Put on clean/comfortable clothes.  Brush your teeth.  Ask your nurse before applying any prescription medications to the skin.      CHG Compatible Lotions   Aveeno Moisturizing lotion  Cetaphil Moisturizing Cream  Cetaphil Moisturizing Lotion  Clairol Herbal Essence Moisturizing Lotion, Dry Skin  Clairol Herbal Essence Moisturizing Lotion, Extra Dry Skin  Clairol Herbal Essence  Moisturizing Lotion, Normal Skin  Curel Age Defying Therapeutic Moisturizing Lotion with Alpha Hydroxy  Curel Extreme Care Body Lotion  Curel Soothing Hands Moisturizing Hand Lotion  Curel Therapeutic Moisturizing Cream, Fragrance-Free  Curel Therapeutic Moisturizing Lotion, Fragrance-Free  Curel Therapeutic Moisturizing Lotion, Original Formula  Eucerin Daily Replenishing Lotion  Eucerin Dry Skin Therapy Plus Alpha Hydroxy Crme  Eucerin Dry Skin Therapy Plus Alpha Hydroxy Lotion  Eucerin Original Crme  Eucerin Original Lotion  Eucerin Plus Crme Eucerin Plus Lotion  Eucerin TriLipid Replenishing Lotion  Keri Anti-Bacterial Hand Lotion  Keri Deep Conditioning Original Lotion Dry Skin Formula Softly Scented  Keri Deep Conditioning Original Lotion, Fragrance Free Sensitive Skin Formula  Keri Lotion Fast Absorbing Fragrance Free Sensitive Skin Formula  Keri Lotion Fast Absorbing Softly Scented Dry Skin Formula  Keri Original Lotion  Keri Skin  Renewal Lotion Keri Silky Smooth Lotion  Keri Silky Smooth Sensitive Skin Lotion  Nivea Body Creamy Conditioning Oil  Nivea Body Extra Enriched Lotion  Nivea Body Original Lotion  Nivea Body Sheer Moisturizing Lotion Nivea Crme  Nivea Skin Firming Lotion  NutraDerm 30 Skin Lotion  NutraDerm Skin Lotion  NutraDerm Therapeutic Skin Cream  NutraDerm Therapeutic Skin Lotion  ProShield Protective Hand Cream  Provon moisturizing lotion   Preparing for Total Shoulder Arthroplasty  Before surgery, you can play an important role by reducing the number of germs on your skin by using the following products:  Benzoyl Peroxide Gel  o Reduces the number of germs present on the skin  o Applied twice a day to shoulder area starting two days before surgery  Chlorhexidine Gluconate (CHG) Soap  o An antiseptic cleaner that kills germs and bonds with the skin to continue killing germs even after washing  o Used for showering the night before surgery and morning of surgery  BENZOYL PEROXIDE 5% GEL  Please do not use if you have an allergy to benzoyl peroxide. If your skin becomes reddened/irritated stop using the benzoyl peroxide.  Starting two days before surgery, apply as follows:  1. Apply benzoyl peroxide in the morning and at night. Apply after taking a shower. If you are not taking a shower, clean entire shoulder front, back, and side along with the armpit with a clean wet washcloth.  2. Place a quarter-sized dollop on your shoulder and rub in thoroughly, making sure to cover the front, back, and side of your shoulder, along with the armpit.  2 days before ____ AM ____ PM 1 day before ____ AM ____ PM  3. Do this twice a day for two days. (Last application is the night before surgery, AFTER using the CHG soap).  4. Do NOT apply benzoyl peroxide gel on the day of surgery.   Preoperative Educational Videos for Total Hip, Knee and Shoulder Replacements  To better prepare for surgery,  please view our videos that explain the physical activity and discharge planning required to have the best surgical recovery at [facility].  [email or web address]  Questions? Call [phone] or email [email or web address]

## 2023-05-05 ENCOUNTER — Ambulatory Visit: Admitting: Obstetrics and Gynecology

## 2023-05-05 DIAGNOSIS — N950 Postmenopausal bleeding: Secondary | ICD-10-CM

## 2023-05-06 ENCOUNTER — Other Ambulatory Visit: Payer: Self-pay | Admitting: Family Medicine

## 2023-05-06 DIAGNOSIS — R1013 Epigastric pain: Secondary | ICD-10-CM

## 2023-05-06 DIAGNOSIS — E1142 Type 2 diabetes mellitus with diabetic polyneuropathy: Secondary | ICD-10-CM

## 2023-05-09 DIAGNOSIS — M12812 Other specific arthropathies, not elsewhere classified, left shoulder: Secondary | ICD-10-CM | POA: Diagnosis not present

## 2023-05-09 DIAGNOSIS — M75122 Complete rotator cuff tear or rupture of left shoulder, not specified as traumatic: Secondary | ICD-10-CM | POA: Diagnosis not present

## 2023-05-11 ENCOUNTER — Ambulatory Visit: Admitting: Obstetrics and Gynecology

## 2023-05-11 ENCOUNTER — Encounter: Payer: Self-pay | Admitting: Obstetrics and Gynecology

## 2023-05-11 VITALS — BP 135/82 | HR 62 | Ht 62.0 in | Wt 170.9 lb

## 2023-05-11 DIAGNOSIS — N838 Other noninflammatory disorders of ovary, fallopian tube and broad ligament: Secondary | ICD-10-CM | POA: Diagnosis not present

## 2023-05-11 MED ORDER — ORAL CARE MOUTH RINSE: 15.0000 mL | Freq: Once | OROMUCOSAL | Status: AC

## 2023-05-11 MED ORDER — VANCOMYCIN HCL IN DEXTROSE 1-5 GM/200ML-% IV SOLN
1000.0000 mg | INTRAVENOUS | Status: AC
  Administered 2023-05-12: 1000 mg via INTRAVENOUS

## 2023-05-11 MED ORDER — TRANEXAMIC ACID-NACL 1000-0.7 MG/100ML-% IV SOLN
1000.0000 mg | INTRAVENOUS | Status: AC
  Administered 2023-05-12: 1000 mg via INTRAVENOUS

## 2023-05-11 MED ORDER — SODIUM CHLORIDE 0.9 % IV SOLN: INTRAVENOUS | Status: DC

## 2023-05-11 MED ORDER — CHLORHEXIDINE GLUCONATE 0.12 % MT SOLN
15.0000 mL | Freq: Once | OROMUCOSAL | Status: AC
  Administered 2023-05-12: 15 mL via OROMUCOSAL

## 2023-05-11 NOTE — Progress Notes (Signed)
 Patient presents today to discuss recent biopsy and MRI results.
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
## 2023-05-12 ENCOUNTER — Other Ambulatory Visit: Payer: Self-pay

## 2023-05-12 ENCOUNTER — Ambulatory Visit

## 2023-05-12 ENCOUNTER — Encounter: Payer: Self-pay | Admitting: Surgery

## 2023-05-12 ENCOUNTER — Ambulatory Visit
Admission: RE | Admit: 2023-05-12 | Discharge: 2023-05-12 | Disposition: A | Discharge status: Home / Self Care | Attending: Surgery | Admitting: Surgery

## 2023-05-12 ENCOUNTER — Ambulatory Visit: Payer: Self-pay | Admitting: Urgent Care

## 2023-05-12 ENCOUNTER — Ambulatory Visit: Admitting: General Practice

## 2023-05-12 ENCOUNTER — Encounter
Admission: RE | Disposition: A | Discharge status: Home / Self Care | Payer: Self-pay | Source: Home / Self Care | Attending: Surgery

## 2023-05-12 DIAGNOSIS — E1142 Type 2 diabetes mellitus with diabetic polyneuropathy: Secondary | ICD-10-CM | POA: Diagnosis not present

## 2023-05-12 DIAGNOSIS — I10 Essential (primary) hypertension: Secondary | ICD-10-CM | POA: Insufficient documentation

## 2023-05-12 DIAGNOSIS — M19012 Primary osteoarthritis, left shoulder: Secondary | ICD-10-CM | POA: Diagnosis not present

## 2023-05-12 DIAGNOSIS — E039 Hypothyroidism, unspecified: Secondary | ICD-10-CM | POA: Diagnosis not present

## 2023-05-12 DIAGNOSIS — Z471 Aftercare following joint replacement surgery: Secondary | ICD-10-CM | POA: Diagnosis not present

## 2023-05-12 DIAGNOSIS — M7522 Bicipital tendinitis, left shoulder: Secondary | ICD-10-CM | POA: Diagnosis not present

## 2023-05-12 DIAGNOSIS — Z01812 Encounter for preprocedural laboratory examination: Secondary | ICD-10-CM

## 2023-05-12 DIAGNOSIS — M75122 Complete rotator cuff tear or rupture of left shoulder, not specified as traumatic: Secondary | ICD-10-CM | POA: Insufficient documentation

## 2023-05-12 DIAGNOSIS — M75121 Complete rotator cuff tear or rupture of right shoulder, not specified as traumatic: Secondary | ICD-10-CM | POA: Diagnosis not present

## 2023-05-12 DIAGNOSIS — Z96612 Presence of left artificial shoulder joint: Secondary | ICD-10-CM | POA: Insufficient documentation

## 2023-05-12 DIAGNOSIS — G8918 Other acute postprocedural pain: Secondary | ICD-10-CM | POA: Diagnosis not present

## 2023-05-12 DIAGNOSIS — K219 Gastro-esophageal reflux disease without esophagitis: Secondary | ICD-10-CM | POA: Insufficient documentation

## 2023-05-12 DIAGNOSIS — M12812 Other specific arthropathies, not elsewhere classified, left shoulder: Secondary | ICD-10-CM | POA: Diagnosis not present

## 2023-05-12 DIAGNOSIS — M7582 Other shoulder lesions, left shoulder: Secondary | ICD-10-CM | POA: Diagnosis not present

## 2023-05-12 HISTORY — PX: REVERSE SHOULDER ARTHROPLASTY: SHX5054

## 2023-05-12 LAB — GLUCOSE, CAPILLARY
Glucose-Capillary: 117 mg/dL — ABNORMAL HIGH (ref 70–99)
Glucose-Capillary: 137 mg/dL — ABNORMAL HIGH (ref 70–99)
Glucose-Capillary: 145 mg/dL — ABNORMAL HIGH (ref 70–99)

## 2023-05-12 SURGERY — ARTHROPLASTY, SHOULDER, TOTAL, REVERSE
Anesthesia: General | Site: Shoulder | Laterality: Left

## 2023-05-12 MED ORDER — SODIUM CHLORIDE 0.9 % IV SOLN: INTRAVENOUS | Status: DC

## 2023-05-12 MED ORDER — BUPIVACAINE HCL (PF) 0.5 % IJ SOLN
INTRAMUSCULAR | Status: AC
  Filled 2023-05-12: qty 10

## 2023-05-12 MED ORDER — SUGAMMADEX SODIUM 200 MG/2ML IV SOLN
INTRAVENOUS | Status: DC | PRN
  Administered 2023-05-12: 200 mg via INTRAVENOUS

## 2023-05-12 MED ORDER — FENTANYL CITRATE (PF) 100 MCG/2ML IJ SOLN
INTRAMUSCULAR | Status: DC | PRN
  Administered 2023-05-12: 50 ug via INTRAVENOUS

## 2023-05-12 MED ORDER — VANCOMYCIN HCL 1000 MG IV SOLR
INTRAVENOUS | Status: DC | PRN
  Administered 2023-05-12: 1000 mg via INTRAVENOUS

## 2023-05-12 MED ORDER — FENTANYL CITRATE PF 50 MCG/ML IJ SOSY
50.0000 ug | PREFILLED_SYRINGE | Freq: Once | INTRAMUSCULAR | Status: AC
  Administered 2023-05-12: 50 ug via INTRAVENOUS

## 2023-05-12 MED ORDER — FENTANYL CITRATE (PF) 100 MCG/2ML IJ SOLN: 25.0000 ug | INTRAMUSCULAR | Status: DC | PRN

## 2023-05-12 MED ORDER — BUPIVACAINE-EPINEPHRINE (PF) 0.5% -1:200000 IJ SOLN
INTRAMUSCULAR | Status: DC | PRN
  Administered 2023-05-12: 30 mL

## 2023-05-12 MED ORDER — EPHEDRINE SULFATE-NACL 50-0.9 MG/10ML-% IV SOSY
PREFILLED_SYRINGE | INTRAVENOUS | Status: DC | PRN
  Administered 2023-05-12: 10 mg via INTRAVENOUS

## 2023-05-12 MED ORDER — ONDANSETRON HCL 4 MG/2ML IJ SOLN
INTRAMUSCULAR | Status: DC | PRN
  Administered 2023-05-12: 4 mg via INTRAVENOUS

## 2023-05-12 MED ORDER — BUPIVACAINE-EPINEPHRINE (PF) 0.5% -1:200000 IJ SOLN
INTRAMUSCULAR | Status: AC
  Filled 2023-05-12: qty 30

## 2023-05-12 MED ORDER — OXYCODONE HCL 5 MG/5ML PO SOLN: 5.0000 mg | Freq: Once | ORAL | Status: DC | PRN

## 2023-05-12 MED ORDER — CHLORHEXIDINE GLUCONATE 0.12 % MT SOLN
OROMUCOSAL | Status: AC
Start: 2023-05-12 — End: ?
  Filled 2023-05-12: qty 15

## 2023-05-12 MED ORDER — VANCOMYCIN HCL IN DEXTROSE 1-5 GM/200ML-% IV SOLN
INTRAVENOUS | Status: AC
  Filled 2023-05-12: qty 200

## 2023-05-12 MED ORDER — METOCLOPRAMIDE HCL 5 MG/ML IJ SOLN: 5.0000 mg | Freq: Three times a day (TID) | INTRAMUSCULAR | Status: DC | PRN

## 2023-05-12 MED ORDER — SODIUM CHLORIDE 0.9 % IR SOLN
Status: DC | PRN
  Administered 2023-05-12: 3000 mL

## 2023-05-12 MED ORDER — OXYCODONE HCL 5 MG PO TABS: 2.5000 mg | ORAL_TABLET | ORAL | Status: DC | PRN

## 2023-05-12 MED ORDER — ACETAMINOPHEN 10 MG/ML IV SOLN
INTRAVENOUS | Status: DC | PRN
  Administered 2023-05-12: 1000 mg via INTRAVENOUS

## 2023-05-12 MED ORDER — MIDAZOLAM HCL 2 MG/2ML IJ SOLN
INTRAMUSCULAR | Status: AC
  Filled 2023-05-12: qty 2

## 2023-05-12 MED ORDER — PHENYLEPHRINE 80 MCG/ML (10ML) SYRINGE FOR IV PUSH (FOR BLOOD PRESSURE SUPPORT)
PREFILLED_SYRINGE | INTRAVENOUS | Status: DC | PRN
  Administered 2023-05-12 (×2): 80 ug via INTRAVENOUS

## 2023-05-12 MED ORDER — BUPIVACAINE HCL (PF) 0.5 % IJ SOLN
INTRAMUSCULAR | Status: DC | PRN
  Administered 2023-05-12: 10 mL

## 2023-05-12 MED ORDER — ONDANSETRON HCL 4 MG/2ML IJ SOLN: 4.0000 mg | Freq: Four times a day (QID) | INTRAMUSCULAR | Status: DC | PRN

## 2023-05-12 MED ORDER — FENTANYL CITRATE PF 50 MCG/ML IJ SOSY
PREFILLED_SYRINGE | INTRAMUSCULAR | Status: AC
  Filled 2023-05-12: qty 1

## 2023-05-12 MED ORDER — METOCLOPRAMIDE HCL 10 MG PO TABS: 5.0000 mg | ORAL_TABLET | Freq: Three times a day (TID) | ORAL | Status: DC | PRN

## 2023-05-12 MED ORDER — BUPIVACAINE LIPOSOME 1.3 % IJ SUSP
INTRAMUSCULAR | Status: DC | PRN
  Administered 2023-05-12: 20 mL

## 2023-05-12 MED ORDER — PHENYLEPHRINE HCL-NACL 20-0.9 MG/250ML-% IV SOLN
INTRAVENOUS | Status: DC | PRN
  Administered 2023-05-12: 50 ug/min via INTRAVENOUS

## 2023-05-12 MED ORDER — PROPOFOL 10 MG/ML IV BOLUS
INTRAVENOUS | Status: DC | PRN
  Administered 2023-05-12: 120 mg via INTRAVENOUS

## 2023-05-12 MED ORDER — MIDAZOLAM HCL 2 MG/2ML IJ SOLN
1.0000 mg | INTRAMUSCULAR | Status: DC | PRN
Start: 2023-05-12 — End: 2023-05-12
  Administered 2023-05-12: 1 mg via INTRAVENOUS

## 2023-05-12 MED ORDER — TRANEXAMIC ACID-NACL 1000-0.7 MG/100ML-% IV SOLN
INTRAVENOUS | Status: AC
  Filled 2023-05-12: qty 100

## 2023-05-12 MED ORDER — ONDANSETRON HCL 4 MG PO TABS: 4.0000 mg | ORAL_TABLET | Freq: Four times a day (QID) | ORAL | Status: DC | PRN

## 2023-05-12 MED ORDER — BUPIVACAINE LIPOSOME 1.3 % IJ SUSP
INTRAMUSCULAR | Status: AC
  Filled 2023-05-12: qty 20

## 2023-05-12 MED ORDER — ROCURONIUM BROMIDE 100 MG/10ML IV SOLN
INTRAVENOUS | Status: DC | PRN
  Administered 2023-05-12: 50 mg via INTRAVENOUS
  Administered 2023-05-12: 10 mg via INTRAVENOUS

## 2023-05-12 MED ORDER — OXYCODONE HCL 5 MG PO TABS: 5.0000 mg | ORAL_TABLET | Freq: Once | ORAL | Status: DC | PRN

## 2023-05-12 MED ORDER — DEXAMETHASONE SODIUM PHOSPHATE 10 MG/ML IJ SOLN
INTRAMUSCULAR | Status: DC | PRN
  Administered 2023-05-12: 5 mg via INTRAVENOUS

## 2023-05-12 MED ORDER — OXYCODONE-ACETAMINOPHEN 2.5-325 MG PO TABS: 1.0000 | ORAL_TABLET | ORAL | 0 refills | Status: DC | PRN

## 2023-05-12 MED ORDER — SEVOFLURANE IN SOLN
RESPIRATORY_TRACT | Status: AC
  Filled 2023-05-12: qty 250

## 2023-05-12 MED ORDER — ACETAMINOPHEN 325 MG PO TABS: 325.0000 mg | ORAL_TABLET | Freq: Four times a day (QID) | ORAL | Status: DC | PRN

## 2023-05-12 MED ORDER — FENTANYL CITRATE (PF) 100 MCG/2ML IJ SOLN
INTRAMUSCULAR | Status: AC
  Filled 2023-05-12: qty 2

## 2023-05-12 SURGICAL SUPPLY — 65 items
BIT DRILL 3 FOR 4.5 SCREW (BIT) ×1 IMPLANT
BIT DRILL 3MM FOR 4.5 SCREW (BIT) IMPLANT
BLADE SAW SAG 25X90X1.19 (BLADE) ×1 IMPLANT
BSPLAT GLND 10D RSS AETOS (Plate) ×1 IMPLANT
CHLORAPREP W/TINT 26 (MISCELLANEOUS) ×1 IMPLANT
COOLER POLAR GLACIER W/PUMP (MISCELLANEOUS) ×1 IMPLANT
DRAPE INCISE IOBAN 66X45 STRL (DRAPES) ×1 IMPLANT
DRAPE SHEET LG 3/4 BI-LAMINATE (DRAPES) ×1 IMPLANT
DRAPE TABLE BACK 80X90 (DRAPES) ×1 IMPLANT
DRSG OPSITE POSTOP 4X8 (GAUZE/BANDAGES/DRESSINGS) ×1 IMPLANT
ELECT BLADE 4.0 EZ CLEAN MEGAD (MISCELLANEOUS) ×1 IMPLANT
ELECT CAUTERY BLADE 6.4 (BLADE) ×1 IMPLANT
ELECT REM PT RETURN 9FT ADLT (ELECTROSURGICAL) ×1 IMPLANT
ELECTRODE BLDE 4.0 EZ CLN MEGD (MISCELLANEOUS) ×1 IMPLANT
ELECTRODE REM PT RTRN 9FT ADLT (ELECTROSURGICAL) ×1 IMPLANT
GAUZE XEROFORM 1X8 LF (GAUZE/BANDAGES/DRESSINGS) ×1 IMPLANT
GLENOID BSPLAT 10D RSS AETOS (Plate) IMPLANT
GLENOSPHERE CON AETOS 38 RSS (Shoulder) IMPLANT
GLOVE BIO SURGEON STRL SZ7.5 (GLOVE) ×4 IMPLANT
GLOVE BIO SURGEON STRL SZ8 (GLOVE) ×4 IMPLANT
GLOVE BIOGEL PI IND STRL 8 (GLOVE) ×2 IMPLANT
GLOVE INDICATOR 8.0 STRL GRN (GLOVE) ×1 IMPLANT
GOWN STRL REUS W/ TWL LRG LVL3 (GOWN DISPOSABLE) ×2 IMPLANT
GOWN STRL REUS W/ TWL XL LVL3 (GOWN DISPOSABLE) ×1 IMPLANT
GUIDE PIN GLENOID 2.5 NT (PIN) ×1 IMPLANT
HANDLE YANKAUER SUCT OPEN TIP (MISCELLANEOUS) ×1 IMPLANT
HOOD PEEL AWAY T7 (MISCELLANEOUS) ×3 IMPLANT
KIT STABILIZATION SHOULDER (MISCELLANEOUS) ×1 IMPLANT
KIT TURNOVER KIT A (KITS) ×1 IMPLANT
LINER STD +3S RSS HXL (Liner) IMPLANT
MANIFOLD NEPTUNE II (INSTRUMENTS) ×1 IMPLANT
MASK FACE SPIDER DISP (MASK) ×1 IMPLANT
MAT ABSORB FLUID 56X50 GRAY (MISCELLANEOUS) ×1 IMPLANT
NDL MAYO CATGUT SZ1 (NEEDLE) IMPLANT
NDL SAFETY ECLIPSE 18X1.5 (NEEDLE) ×1 IMPLANT
NDL SPNL 20GX3.5 QUINCKE YW (NEEDLE) ×1 IMPLANT
NEEDLE MAYO CATGUT SZ1 (NEEDLE) IMPLANT
NEEDLE SPNL 20GX3.5 QUINCKE YW (NEEDLE) ×1 IMPLANT
PACK ARTHROSCOPY SHOULDER (MISCELLANEOUS) ×1 IMPLANT
PAD ARMBOARD POSITIONER FOAM (MISCELLANEOUS) ×1 IMPLANT
PAD WRAPON POLAR SHDR UNIV (MISCELLANEOUS) ×1 IMPLANT
PIN GUIDE GLENOID 2.5 NT (PIN) IMPLANT
PULSAVAC PLUS IRRIG FAN TIP (DISPOSABLE) ×1 IMPLANT
SCREW BODY REVERSE STD (Screw) IMPLANT
SCREW CENTER 4.5X40 RSS (Screw) IMPLANT
SCREW LOCK 4.5X15 RSS (Screw) IMPLANT
SCREW LOCKING 4.5X30 RSS (Screw) IMPLANT
SLING ULTRA II LG (MISCELLANEOUS) IMPLANT
SLING ULTRA II M (MISCELLANEOUS) IMPLANT
SOL .9 NS 3000ML IRR UROMATIC (IV SOLUTION) ×1 IMPLANT
SPONGE T-LAP 18X18 ~~LOC~~+RFID (SPONGE) ×2 IMPLANT
STAPLER SKIN PROX 35W (STAPLE) ×1 IMPLANT
STEM PRESS FIT SZ 08 TSS (Stem) IMPLANT
SUT ETHIBOND 0 MO6 C/R (SUTURE) ×1 IMPLANT
SUT FIBERWIRE #2 38 BLUE 1/2 (SUTURE) ×4 IMPLANT
SUT VIC AB 0 CT1 36 (SUTURE) ×1 IMPLANT
SUT VIC AB 2-0 CT1 TAPERPNT 27 (SUTURE) ×2 IMPLANT
SUTURE FIBERWR #2 38 BLUE 1/2 (SUTURE) ×4 IMPLANT
SYR 10ML LL (SYRINGE) ×1 IMPLANT
SYR 30ML LL (SYRINGE) ×1 IMPLANT
SYR TOOMEY 50ML (SYRINGE) ×1 IMPLANT
TIP FAN IRRIG PULSAVAC PLUS (DISPOSABLE) ×1 IMPLANT
TRAP FLUID SMOKE EVACUATOR (MISCELLANEOUS) ×1 IMPLANT
WATER STERILE IRR 500ML POUR (IV SOLUTION) ×1 IMPLANT
WRAPON POLAR PAD SHDR UNIV (MISCELLANEOUS) ×1 IMPLANT

## 2023-05-12 NOTE — H&P (Signed)
 History of Present Illness:  [name] is an 89 y.o. female who presents today for history and physical for an upcoming left reverse shoulder replacement to be done by Dr. Joice Lofts on May 12, 2023. The patient saw Dr. Joice Lofts on March 14, 2023..   The patient's present symptoms began about 6 weeks ago and developed as a result of a fall . Apparently, the patient stumbled down several steps while leaving her house and fell against her car, striking her left shoulder. She went to [facility] where x-rays were unremarkable. Subsequently, the patient was sent for an MRI scan of the shoulder. She was told that she had torn several tendons in her shoulder and would need to consider a reverse total shoulder replacement. The patient and her family elected to seek a second opinion and present at this time for further evaluation and treatment of her shoulder symptoms. The patient notes that she had been told many years ago that she had "arthritis" in the shoulder but it was not until the fall that she had difficulty raising her arm above shoulder level. The patient describes the symptoms as marked (major pain with significant limitations) and have the quality of being aching, miserable, nagging, stabbing, tender, and throbbing. The pain is localized to the lateral arm/shoulder and localized to the anterior shoulder. These symptoms are aggravated with normal daily activities, with sleeping, carrying heavy objects, at higher levels of activity, with overhead activity, and reaching behind the back. She has tried acetaminophen with limited benefit. She has tried rest with limited benefit. She has not tried any steroid injections or physical therapy for the symptoms. She denies any neck pain, noted she note any numbness or paresthesias down her arm to her hand.  This complaint is not work related. She is a sports non-participant.  Shoulder Surgical History:  The patient has had no shoulder surgery in the past.  Current  Outpatient Medications:  amLODIPine (NORVASC) 5 MG tablet Take 5 mg by mouth once daily  ascorbic acid, vitamin C, (VITAMIN C) 1000 MG tablet Take 1,000 mg by mouth once daily  aspirin-calcium carbonate 81 mg-300 mg calcium(777 mg) Tab Take by mouth.  aspirin-calcium carbonate 81 mg-300 mg calcium(777 mg) Tab Take by mouth  atenolol (TENORMIN) 50 MG tablet  atenoloL (TENORMIN) 50 MG tablet Take 25 mg by mouth 2 (two) times daily  atorvastatin (LIPITOR) 20 MG tablet Take 1 tablet by mouth once a week  atorvastatin (LIPITOR) 20 MG tablet Take 20 mg by mouth once daily  blood glucose diagnostic (ONETOUCH ULTRA TEST) test strip once daily  blood glucose meter (ONETOUCH ULTRA2 METER) kit Use to check sugars daily E11.69  cholecalciferol (VITAMIN D3) 2,000 unit capsule Take 2,000 Units by mouth once daily  coenzyme Q10-vitamin E 100-5 mg-unit Cap Take 1 capsule by mouth once daily  cyanocobalamin (VITAMIN B12) 500 MCG tablet Take by mouth.  docusate (COLACE) 100 MG capsule Take 200 mg by mouth once daily  erythromycin (ROMYCIN) ophthalmic ointment  lancets (2-IN-1 LANCET DEVICE MISC) One touch delica - Use as directed  levothyroxine (SYNTHROID) 100 MCG tablet Take 100 mcg by mouth every morning before breakfast (0630)  levothyroxine (SYNTHROID, LEVOTHROID) 112 MCG tablet Take by mouth.  losartan (COZAAR) 50 MG tablet Take by mouth.  methocarbamoL (ROBAXIN) 500 MG tablet Take 500 mg by mouth once daily  montelukast (SINGULAIR) 10 mg tablet Take by mouth.  montelukast (SINGULAIR) 10 mg tablet Take 1 tablet by mouth once daily  mupirocin (BACTROBAN) 2 %  ointment  omeprazole (PRILOSEC) 40 MG DR capsule Take 40 mg by mouth 3 (three) times a week Every Monday, Wednesday, and Friday.  ONETOUCH ULTRA TEST test strip once daily  ONETOUCH ULTRA2 METER Misc USE TO CHECK SUGARS DAILY  oxyCODONE (ROXICODONE) 5 MG immediate release tablet Take 5 mg by mouth every 4 (four) hours as needed  traMADoL (ULTRAM)  50 mg tablet Take 50 mg by mouth every 6 (six) hours as needed for Pain  baclofen (LIORESAL) 5 mg tablet Take 5 mg by mouth 3 (three) times daily as needed  estradioL (ESTRACE) 0.01 % (0.1 mg/gram) vaginal cream Place 1 Applicatorful vaginally   Allergies:  Amoxicillin-Pot Clavulanate Anaphylaxis (Possible, had itchy rash) Cefprozil Swelling  Metformin Other (Severe stomach pains, muscle aches ) Timolol Unknown (Local reaction to eye drops)   Past Medical History:  Diabetes mellitus without complication (CMS/HHS-HCC)  Glaucoma (increased eye pressure)  High cholesterol  Type 2 diabetes mellitus (CMS/HHS-HCC)   Past Surgical History:  GLAUCOMA EYE SURGERY Bilateral 2007  SLT's OU  GLAUCOMA EYE SURGERY Bilateral  Trab's OU  LENS EYE SURGERY Bilateral   Family History:  Glaucoma Brother  Glaucoma Maternal Aunt  Glaucoma Maternal Grandmother  Macular degeneration Neg Hx   Social History:   Socioeconomic History:  Marital status: Married  Tobacco Use  Smoking status: Never  Smokeless tobacco: Never   Social Drivers of Health:   Physicist, medical Strain: Low Risk (06/08/2022)  Received from [facility]  Overall Financial Resource Strain (CARDIA)  Difficulty of Paying Living Expenses: Not hard at all  Food Insecurity: No Food Insecurity (06/08/2022)  Received from [facility]  Hunger Vital Sign  Worried About Running Out of Food in the Last Year: Never true  Ran Out of Food in the Last Year: Never true  Transportation Needs: No Transportation Needs (06/08/2022)  Received from [facility] - Transportation  Lack of Transportation (Medical): No  Lack of Transportation (Non-Medical): No   Review of Systems:  A comprehensive 14 point ROS was performed, reviewed, and the pertinent orthopaedic findings are documented in the HPI.  Physical Exam:  Vitals:  05/09/23 0852 05/09/23 0853  BP: 136/72  Weight: 75.8 kg (167 lb)  Height: 157.5 cm (5\' 2" )  PainSc: 4 4   PainLoc: Shoulder Shoulder   General/Constitutional: The patient appears to be well-nourished, well-developed, and in no acute distress. Neuro/Psych: Normal mood and affect, oriented to person, place and time. Eyes: Non-icteric. Pupils are equal, round, and reactive to light, and exhibit synchronous movement. ENT: Unremarkable. Lymphatic: No palpable adenopathy. Respiratory: Lungs clear to auscultation, Normal chest excursion, No wheezes, and Non-labored breathing Cardiovascular: Regular rate and rhythm. No murmurs. and No edema, swelling or tenderness, except as noted in detailed exam. Integumentary: No impressive skin lesions present, except as noted in detailed exam. Musculoskeletal: Unremarkable, except as noted in detailed exam.  Heart: Examination of the heart reveals regular, rate, and rhythm. There is no murmur noted on ascultation. There is a normal apical pulse.  Lungs: Lungs are clear to auscultation. There is no wheeze, rhonchi, or crackles. There is normal expansion of bilateral chest walls.   Left shoulder exam: SKIN: normal SWELLING: none WARMTH: none LYMPH NODES: no adenopathy palpable CREPITUS: none TENDERNESS: Mild-moderate tenderness over anterolateral shoulder ROM (active):  Forward flexion: 85 degrees Abduction: 70 degrees Internal rotation: Left PSIS ROM (passive):  Forward flexion: 150 degrees Abduction: 140 degrees ER/IR at 90 abd: 85 degrees / 50 degrees  She experiences moderate  pain at the extremes of active motion, and mild pain at the extremes of passive range of motion.  STRENGTH: Forward flexion: 3/5 Abduction: 3/5 External rotation: 3+/5 Internal rotation: 4-4+/5 Pain with RC testing: Marked pain with attempted forward flexion and abduction, and mild pain with resisted external rotation  STABILITY: Normal  SPECIAL TESTS: Juanetta Gosling' test: positive, moderate Speed's test: Not evaluated Capsulitis - pain w/ passive ER: no Crossed arm test:  Mildly positive Crank: Not evaluated Anterior apprehension: Negative Posterior apprehension: Not evaluated  She is neurovascularly intact to the left upper extremity.  Imaging:  Shoulder X-Ray Imaging: True AP, Y-scapular, and axillary views of the left shoulder are obtained. These films demonstrate moderate degenerative changes as manifest by superior glenohumeral joint space narrowing and a small inferior humeral osteophyte. The subacromial space is markedly decreased. There is no subacromial or infra-clavicular spurring. She demonstrates a Type II acromion.  Shoulder Imaging, MRI: The patient apparently has undergone an MRI scan of the left shoulder, but neither the report nor images are available for review at this time.  Assessment:  1. Rotator cuff arthropathy, left..  2. Nontraumatic complete tear of left rotator cuff.   Plan:  The treatment options were discussed with the patient, her husband, and her daughter. In addition, patient educational materials were provided regarding the diagnosis and treatment options. The patient is quite frustrated by her symptoms and functional limitations, and is ready to consider more aggressive treatment options. Therefore, I have recommended a surgical procedure, specifically a reverse left total shoulder arthroplasty. The procedure was discussed with the patient, as were the potential risks (including bleeding, infection, nerve and/or blood vessel injury, persistent or recurrent pain, loosening and/or failure of the components, dislocation, need for further surgery, blood clots, strokes, heart attacks and/or arhythmias, pneumonia, etc.) and benefits. The patient states her understanding and wishes to proceed. All of the patient's questions and concerns were answered. She can call any time with further concerns. She will follow up post-surgery, routine.    H&P reviewed and patient re-examined. No changes.

## 2023-05-12 NOTE — Anesthesia Postprocedure Evaluation (Signed)
 Anesthesia Post Note  Patient: [name]  Procedure(s) Performed: ARTHROPLASTY, SHOULDER, TOTAL, REVERSE (Left: Shoulder)  Patient location during evaluation: PACU Anesthesia Type: General Level of consciousness: awake and alert Pain management: pain level controlled Vital Signs Assessment: post-procedure vital signs reviewed and stable Respiratory status: spontaneous breathing, nonlabored ventilation, respiratory function stable and patient connected to nasal cannula oxygen Cardiovascular status: blood pressure returned to baseline and stable Postop Assessment: no apparent nausea or vomiting Anesthetic complications: no  No notable events documented.   Last Vitals:  Vitals:   05/12/23 1300 05/12/23 1315  BP: (!) 116/56 124/65  Pulse: 72 75  Resp: 17 15  Temp:    SpO2: 100% 99%    Last Pain:  Vitals:   05/12/23 1315  TempSrc:   PainSc: 0-No pain                 Stephanie Coup
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
## 2023-05-12 NOTE — Op Note (Signed)
 05/12/2023  12:25 PM  Patient:   [name]  Pre-Op Diagnosis:   Massive irreparable rotator cuff tear with cuff arthropathy, left shoulder.  Post-Op Diagnosis:   Same with biceps tendinopathy left shoulder.  Procedure:   Reverse left total shoulder arthroplasty with biceps tenodesis.  Surgeon:   Maryagnes Amos, MD  Assistant:   Horris Latino, PA-C  Anesthesia:   General endotracheal with an interscalene block using Exparel placed preoperatively by the anesthesiologist.  Findings:   As above.  Complications:   None  EBL:   75 cc  Fluids:   500 cc crystalloid  UOP:   None  TT:   None  Drains:   None  Closure:   Staples  Implants:   All press-fit Integra/AETOS system with an 8 mm stem, a standard metaphyseal body, a +3 mm humeral platform, a 10 degree augmented full wedge AETOS baseplate, and a 38 mm concentric +1 mm laterally offset glenosphere.  Brief Clinical Note:   The patient is an 89 year old female with a long history of gradually worsening pain and weakness of her left shoulder.  Her symptoms have progressed despite medications, activity modification, etc.  Her history and examination are consistent with moderate cuff arthropathy secondary to a massive irreparable rotator cuff tear, all of which were confirmed by pre-operative MRI scanning. The patient presents at this time for a reverse left total shoulder arthroplasty with biceps tenodesis.  Procedure:   The patient underwent placement of an interscalene block using Exparel by the anesthesiologist in the preoperative holding area before being brought into the operating room and lain in the supine position. The patient then underwent general endotracheal intubation and anesthesia before the patient was repositioned in the beach chair position using the beach chair positioner. The left shoulder and upper extremity were prepped with ChloraPrep solution before being draped sterilely. Preoperative antibiotics were  administered.   A timeout was performed to verify the appropriate surgical site before a standard anterior approach to the shoulder was made through an approximately 4-5 inch incision. The incision was carried down through the subcutaneous tissues to expose the deltopectoral fascia. The interval between the deltoid and pectoralis muscles was identified and this plane developed, retracting the cephalic vein laterally with the deltoid muscle. The conjoined tendon was identified. Its lateral margin was dissected and the Kolbel self-retraining retractor inserted. The "three sisters" were identified and cauterized. Bursal tissues were removed to improve visualization.   The biceps tendon was identified near the inferior aspect of the bicipital groove. A soft tissue tenodesis was performed by attaching the biceps tendon to the adjacent pectoralis major tendon using two #0 Ethibond interrupted sutures. The biceps tendon was then transected just proximal to the tenodesis site. The subscapularis tendon was released from its attachment to the lesser tuberosity 1 cm proximal to its insertion and several tagging sutures placed. The inferior capsule was released with care after identifying and protecting the axillary nerve. The proximal humeral cut was made at approximately 20 of retroversion using the extra-medullary guide.   Attention was redirected to the glenoid. The labrum was debrided circumferentially before the center of the glenoid was identified. The guidewire was drilled into the glenoid neck using the appropriate 10 degree augmented guide oriented to approximately the 1:30 position. After verifying its position, it was overreamed with the 10 degree augmented mini-baseplate reamer to create a flat surface before the stem reamer was utilized. The permanent mini-baseplate was impacted into place. It was stabilized with a  40 x 4.5 mm central screw and four peripheral screws. The permanent 38 mm concentric  glenosphere with +1 mm of lateral offset was then impacted into place and its Morse taper locking mechanism verified using manual distraction.  The central set screw was then advanced and tightened securely.  Attention was directed to the humeral side. The humeral canal was prepared utilizing the tapered stem reamers sequentially beginning with the 6 mm stem and progressing to a 8 mm stem. This demonstrated a good tight fit. The metaphyseal region was then prepared using the appropriate planar device. The trial stem and standard metaphyseal body were put together on the back table and a trial reduction performed using the +0 mm and +3 mm inserts. With the +3 mm insert, the arm demonstrated excellent range of motion as the hand could be brought across the chest to the opposite shoulder and brought to the top of the patient's head and to the patient's ear. The shoulder appeared stable throughout this range of motion. The joint was dislocated and the trial components removed.   The permanent 8 mm stem with the standard body was impacted into place with care taken to maintain the appropriate version. A repeat trial reduction with the +3 mm insert again demonstrated excellent stability with the findings as described above. Therefore, the shoulder was re-dislocated and, after inserting the locking screw to secure the body to the stem, the permanent +3 mm insert impacted into place. After verifying its locking mechanism, the shoulder was relocated using two finger pressure and again placed through a range of motion with the findings as described above.  The wound was copiously irrigated with sterile saline solution using the jet lavage system before a total of30 cc of 0.5% Sensorcaine with epinephrine was injected into the pericapsular and peri-incisional tissues to help with postoperative analgesia. The subscapularis tendon was reapproximated using #2 FiberWire interrupted sutures. The deltopectoral interval was  closed using #0 Vicryl interrupted sutures before the subcutaneous tissues were closed using 2-0 Vicryl interrupted sutures. The skin was closed using staples. A sterile occlusive dressing was applied to the wound before the arm was placed into a shoulder immobilizer with an abduction pillow. A Polar Care system also was applied to the shoulder. The patient was then transferred back to a hospital bed before being awakened, extubated, and returned to the recovery room in satisfactory condition after tolerating the procedure well.

## 2023-05-12 NOTE — Discharge Instructions (Addendum)
 Orthopedic discharge instructions: May shower with intact OpSite dressing once nerve block has worn off (Monday).  Apply ice frequently to shoulder or use Polar Care device. Take oxycodone as prescribed when needed.  May supplement with ES Tylenol if necessary. Keep shoulder immobilizer on at all times except may remove for bathing purposes. Follow-up in 10-14 days or as scheduled.   POLAR CARE INFORMATION  MassAdvertisement.it  How to use [employer] Polar Care [facility] Therapy System?  YouTube   ShippingScam.co.uk  OPERATING INSTRUCTIONS  Start the product With dry hands, connect the transformer to the electrical connection located on the top of the cooler. Next, plug the transformer into an appropriate electrical outlet. The unit will automatically start running at this point.  To stop the pump, disconnect electrical power.  Unplug to stop the product when not in use. Unplugging the Polar Care unit turns it off. Always unplug immediately after use. Never leave it plugged in while unattended. Remove pad.    FIRST ADD WATER TO FILL LINE, THEN ICE---Replace ice when existing ice is almost melted  1 Discuss Treatment with your Licensed Health Care Practitioner and Use Only as Prescribed 2 Apply Insulation Barrier & Cold Therapy Pad 3 Check for Moisture 4 Inspect Skin Regularly  Tips and Trouble Shooting Usage Tips 1. Use cubed or chunked ice for optimal performance. 2. It is recommended to drain the Pad between uses. To drain the pad, hold the Pad upright with the hose pointed toward the ground. Depress the black plunger and allow water to drain out. 3. You may disconnect the Pad from the unit without removing the pad from the affected area by depressing the silver tabs on the hose coupling and gently pulling the hoses apart. The Pad and unit will seal itself and will not leak. Note: Some dripping during release is normal. 4. DO NOT RUN PUMP WITHOUT WATER! The pump in  this unit is designed to run with water. Running the unit without water will cause permanent damage to the pump. 5. Unplug unit before removing lid.  TROUBLESHOOTING GUIDE Pump not running, Water not flowing to the pad, Pad is not getting cold 1. Make sure the transformer is plugged into the wall outlet. 2. Confirm that the ice and water are filled to the indicated levels. 3. Make sure there are no kinks in the pad. 4. Gently pull on the blue tube to make sure the tube/pad junction is straight. 5. Remove the pad from the treatment site and ll it while the pad is lying at; then reapply. 6. Confirm that the pad couplings are securely attached to the unit. Listen for the double clicks (Figure 1) to confirm the pad couplings are securely attached.  Leaks    Note: Some condensation on the lines, controller, and pads is unavoidable, especially in warmer climates. 1. If using a Breg Polar Care Cold Therapy unit with a detachable Cold Therapy Pad, and a leak exists (other than condensation on the lines) disconnect the pad couplings. Make sure the silver tabs on the couplings are depressed before reconnecting the pad to the pump hose; then confirm both sides of the coupling are properly clicked in. 2. If the coupling continues to leak or a leak is detected in the pad itself, stop using it and call [employer] Customer Care at [phone].  Cleaning After use, empty and dry the unit with a soft cloth. Warm water and mild detergent may be used occasionally to clean the pump and tubes.  WARNING: The Polar Care Cube can be cold enough to cause serious injury, including full skin necrosis. Follow these Operating Instructions, and carefully read the Product Insert (see pouch on side of unit) and the Cold Therapy Pad Fitting Instructions (provided with each Cold Therapy Pad) prior to use.       Interscalene Nerve Block (ISNB) Discharge Instructions    For your surgery you have received an Interscalene Nerve  Block. Nerve Blocks affect many types of nerves, including nerves that control movement, pain and normal sensation.  You may experience feelings such as numbness, tingling, heaviness, weakness or the inability to move your arm or the feeling or sensation that your arm has "fallen asleep". A nerve block can last for 2 - 36 hours or more depending on the medication used.  Usually the weakness wears off first.  The tingling and heaviness usually wear off next.  Finally you may start to notice pain.  Keep in mind that this may occur in any order.  once a nerve block starts to wear off it is usually completely gone within 60 minutes. ISNB may cause mild shortness of breath, a hoarse voice, blurry vision, unequal pupils, or drooping of the face on the same side as the nerve block.  These symptoms will usually go away within 12 hours.  Very rarely the procedure itself can cause mild seizures. If needed, your surgeon will give you a prescription for pain medication.  It will take about 60 minutes for the oral pain medication to become fully effective.  So, it is recommended that you start taking this medication before the nerve block first begins to wear off, or when you first begin to feel discomfort. Keep in mind that nerve blocks often wear off in the middle of the night.   If you are going to bed and the block has not started to wear off or you have not started to have any discomfort, consider setting an alarm for 2 to 3 hours, so you can assess your block.  If you notice the block is wearing off or you are starting to have discomfort, you can take your pain medication. Take your pain medication only as prescribed.  Pain medication can cause sedation and decrease your breathing if you take more than you need for the level of pain that you have. Nausea is a common side effect of many pain medications.  You may want to eat something before taking your pain medicine to prevent nausea. After an Interscalene nerve  block, you cannot feel pain, pressure or extremes in temperature in the effected arm.  Because your arm is numb it is at an increased risk for injury.  To decrease the possibility of injury, please practice the following:  While you are awake change the position of your arm frequently to prevent too much pressure on any one area for prolonged periods of time.  If you have a cast or tight dressing, check the color or your fingers every couple of hours.  Call your surgeon with the appearance of any discoloration (white or blue). If you are given a sling to wear before you go home, please wear it  at all times until the block has completely worn off.  Do not get up at night without your sling. If you experience any problems or concerns, please contact your surgeon's office. If you experience severe or prolonged shortness of breath go to the nearest emergency department.   SHOULDER SLING IMMOBILIZER   VIDEO  Slingshot 2 Shoulder Brace Application - YouTube ---https://www.porter.info/  INSTRUCTIONS While supporting the injured arm, slide the forearm into the sling. Wrap the adjustable shoulder strap around the neck and shoulders and attach the strap end to the sling using  the "alligator strap tab."  Adjust the shoulder strap to the required length. Position the shoulder pad behind the neck. To secure the shoulder pad location (optional), pull the shoulder strap away from the shoulder pad, unfold the hook material on the top of the pad, then press the shoulder strap back onto the hook material to secure the pad in place. Attach the closure strap across the open top of the sling. Position the strap so that it holds the arm securely in the sling. Next, attach the thumb strap to the open end of the sling between the thumb and fingers. After sling has been fit, it may be easily removed and reapplied using the quick release buckle on shoulder strap. If a neutral pillow or 15 abduction  pillow is included, place the pillow at the waistline. Attach the sling to the pillow, lining up hook material on the pillow with the loop on sling. Adjust the waist strap to fit.  If waist strap is too long, cut it to fit. Use the small piece of double sided hook material (located on top of the pillow) to secure the strap end. Place the double sided hook material on the inside of the cut strap end and secure it to the waist strap.     If no pillow is included, attach the waist strap to the sling and adjust to fit.    Washing Instructions: Straps and sling must be removed and cleaned regularly depending on your activity level and perspiration. Hand wash straps and sling in cold water with mild detergent, rinse, air dry

## 2023-05-12 NOTE — Anesthesia Procedure Notes (Signed)
 Anesthesia Regional Block: Interscalene brachial plexus block   Pre-Anesthetic Checklist: , timeout performed,  Correct Patient, Correct Site, Correct Laterality,  Correct Procedure, Correct Position, site marked,  Risks and benefits discussed,  Surgical consent,  Pre-op evaluation,  At surgeon's request and post-op pain management  Laterality: Left  Prep: chloraprep       Needles:  Injection technique: Single-shot  Needle Type: Echogenic Needle     Needle Length: 4cm  Needle Gauge: 25     Additional Needles:   Procedures:,,,, ultrasound used (permanent image in chart),,    Narrative:  Start time: 05/12/2023 9:38 AM End time: 05/12/2023 9:40 AM Injection made incrementally with aspirations every 5 mL.  Performed by: Personally  Anesthesiologist: Stephanie Coup, MD  Additional Notes: Patient's chart reviewed and they were deemed appropriate candidate for procedure, at surgeon's request. Patient educated about risks, benefits, and alternatives of the block including but not limited to: temporary or permanent nerve damage, bleeding, infection, damage to surround tissues, pneumothorax, hemidiaphragmatic paralysis, unilateral Horner's syndrome, block failure, local anesthetic toxicity. Patient expressed understanding. A formal time-out was conducted consistent with institution rules.  Monitors were applied, and minimal sedation used (see nursing record). The site was prepped with skin prep and allowed to dry, and sterile gloves were used. A high frequency linear ultrasound probe with probe cover was utilized throughout. C5-7 nerve roots located and appeared anatomically normal, local anesthetic injected around them, and echogenic block needle trajectory was monitored throughout. Aspiration performed every 5ml. Lung and blood vessels were avoided. All injections were performed without resistance and free of blood and paresthesias. The patient tolerated the procedure well.  Injectate:  20ml exparel + 10ml 0.5% bupivacaine

## 2023-05-12 NOTE — Transfer of Care (Signed)
 Immediate Anesthesia Transfer of Care Note  Patient: [name]  Procedure(s) Performed: ARTHROPLASTY, SHOULDER, TOTAL, REVERSE (Left: Shoulder)  Patient Location: PACU  Anesthesia Type:General  Level of Consciousness: drowsy  Airway & Oxygen Therapy: Patient Spontanous Breathing and Patient connected to face mask oxygen  Post-op Assessment: Report given to RN, Post -op Vital signs reviewed and stable, and Patient moving all extremities  Post vital signs: Reviewed and stable  Last Vitals:  Vitals Value Taken Time  BP 118/58 05/12/23 1254  Temp    Pulse 74 05/12/23 1259  Resp 17 05/12/23 1259  SpO2 100 % 05/12/23 1259  Vitals shown include unfiled device data.  Last Pain:  Vitals:   05/12/23 0938  TempSrc:   PainSc: 0-No pain         Complications: No notable events documented.

## 2023-05-12 NOTE — Anesthesia Procedure Notes (Addendum)
 Procedure Name: Intubation Date/Time: 05/12/2023 10:43 AM  Performed by: Edmund Hilda, CRNAPre-anesthesia Checklist: Patient identified, Patient being monitored, Timeout performed, Emergency Drugs available and Suction available Patient Re-evaluated:Patient Re-evaluated prior to induction Oxygen Delivery Method: Circle system utilized Preoxygenation: Pre-oxygenation with 100% oxygen Induction Type: IV induction Ventilation: Mask ventilation without difficulty Laryngoscope Size: Mac and 3 Grade View: Grade I Tube type: Oral Tube size: 7.0 mm Number of attempts: 1 Airway Equipment and Method: Stylet Placement Confirmation: ETT inserted through vocal cords under direct vision, positive ETCO2 and breath sounds checked- equal and bilateral Secured at: 21 cm Tube secured with: Tape Dental Injury: Teeth and Oropharynx as per pre-operative assessment
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
## 2023-05-13 ENCOUNTER — Encounter: Payer: Self-pay | Admitting: Surgery

## 2023-05-13 ENCOUNTER — Other Ambulatory Visit: Payer: Self-pay | Admitting: Family Medicine

## 2023-05-13 DIAGNOSIS — I10 Essential (primary) hypertension: Secondary | ICD-10-CM

## 2023-05-13 LAB — INHIBIN A: Inhibin A, Ultrasensitive: 2.1 pg/mL

## 2023-05-13 LAB — INHIBIN B: Inhibin B: <7 pg/mL (ref 0.0–16.9)

## 2023-05-14 DIAGNOSIS — Z860100 Personal history of colon polyps, unspecified: Secondary | ICD-10-CM | POA: Diagnosis not present

## 2023-05-14 DIAGNOSIS — E079 Disorder of thyroid, unspecified: Secondary | ICD-10-CM | POA: Diagnosis not present

## 2023-05-14 DIAGNOSIS — E7800 Pure hypercholesterolemia, unspecified: Secondary | ICD-10-CM | POA: Diagnosis not present

## 2023-05-14 DIAGNOSIS — E119 Type 2 diabetes mellitus without complications: Secondary | ICD-10-CM | POA: Diagnosis not present

## 2023-05-14 DIAGNOSIS — I951 Orthostatic hypotension: Secondary | ICD-10-CM | POA: Diagnosis not present

## 2023-05-14 DIAGNOSIS — I10 Essential (primary) hypertension: Secondary | ICD-10-CM | POA: Diagnosis not present

## 2023-05-14 DIAGNOSIS — Z471 Aftercare following joint replacement surgery: Secondary | ICD-10-CM | POA: Diagnosis not present

## 2023-05-14 DIAGNOSIS — K5900 Constipation, unspecified: Secondary | ICD-10-CM | POA: Diagnosis not present

## 2023-05-14 DIAGNOSIS — H409 Unspecified glaucoma: Secondary | ICD-10-CM | POA: Diagnosis not present

## 2023-05-14 DIAGNOSIS — Z7982 Long term (current) use of aspirin: Secondary | ICD-10-CM | POA: Diagnosis not present

## 2023-05-14 DIAGNOSIS — Z556 Problems related to health literacy: Secondary | ICD-10-CM | POA: Diagnosis not present

## 2023-05-14 DIAGNOSIS — Z96612 Presence of left artificial shoulder joint: Secondary | ICD-10-CM | POA: Diagnosis not present

## 2023-05-14 DIAGNOSIS — Z8616 Personal history of COVID-19: Secondary | ICD-10-CM | POA: Diagnosis not present

## 2023-05-16 DIAGNOSIS — K5900 Constipation, unspecified: Secondary | ICD-10-CM | POA: Diagnosis not present

## 2023-05-16 DIAGNOSIS — I10 Essential (primary) hypertension: Secondary | ICD-10-CM | POA: Diagnosis not present

## 2023-05-16 DIAGNOSIS — Z556 Problems related to health literacy: Secondary | ICD-10-CM | POA: Diagnosis not present

## 2023-05-16 DIAGNOSIS — E7800 Pure hypercholesterolemia, unspecified: Secondary | ICD-10-CM | POA: Diagnosis not present

## 2023-05-16 DIAGNOSIS — H409 Unspecified glaucoma: Secondary | ICD-10-CM | POA: Diagnosis not present

## 2023-05-16 DIAGNOSIS — Z8616 Personal history of COVID-19: Secondary | ICD-10-CM | POA: Diagnosis not present

## 2023-05-16 DIAGNOSIS — I951 Orthostatic hypotension: Secondary | ICD-10-CM | POA: Diagnosis not present

## 2023-05-16 DIAGNOSIS — Z471 Aftercare following joint replacement surgery: Secondary | ICD-10-CM | POA: Diagnosis not present

## 2023-05-16 DIAGNOSIS — E079 Disorder of thyroid, unspecified: Secondary | ICD-10-CM | POA: Diagnosis not present

## 2023-05-16 DIAGNOSIS — E119 Type 2 diabetes mellitus without complications: Secondary | ICD-10-CM | POA: Diagnosis not present

## 2023-05-16 DIAGNOSIS — Z860100 Personal history of colon polyps, unspecified: Secondary | ICD-10-CM | POA: Diagnosis not present

## 2023-05-16 DIAGNOSIS — Z7982 Long term (current) use of aspirin: Secondary | ICD-10-CM | POA: Diagnosis not present

## 2023-05-16 DIAGNOSIS — Z96612 Presence of left artificial shoulder joint: Secondary | ICD-10-CM | POA: Diagnosis not present

## 2023-05-16 NOTE — Telephone Encounter (Signed)
 Atenolol Last OV:  02/11/23, 4 mo f/u Next OV:  07/29/23, CPE

## 2023-05-18 ENCOUNTER — Telehealth: Payer: Self-pay | Admitting: Family Medicine

## 2023-05-18 DIAGNOSIS — H409 Unspecified glaucoma: Secondary | ICD-10-CM | POA: Diagnosis not present

## 2023-05-18 DIAGNOSIS — Z556 Problems related to health literacy: Secondary | ICD-10-CM | POA: Diagnosis not present

## 2023-05-18 DIAGNOSIS — Z471 Aftercare following joint replacement surgery: Secondary | ICD-10-CM | POA: Diagnosis not present

## 2023-05-18 DIAGNOSIS — E079 Disorder of thyroid, unspecified: Secondary | ICD-10-CM | POA: Diagnosis not present

## 2023-05-18 DIAGNOSIS — E7800 Pure hypercholesterolemia, unspecified: Secondary | ICD-10-CM | POA: Diagnosis not present

## 2023-05-18 DIAGNOSIS — I10 Essential (primary) hypertension: Secondary | ICD-10-CM | POA: Diagnosis not present

## 2023-05-18 DIAGNOSIS — Z7982 Long term (current) use of aspirin: Secondary | ICD-10-CM | POA: Diagnosis not present

## 2023-05-18 DIAGNOSIS — I951 Orthostatic hypotension: Secondary | ICD-10-CM | POA: Diagnosis not present

## 2023-05-18 DIAGNOSIS — Z8616 Personal history of COVID-19: Secondary | ICD-10-CM | POA: Diagnosis not present

## 2023-05-18 DIAGNOSIS — E119 Type 2 diabetes mellitus without complications: Secondary | ICD-10-CM | POA: Diagnosis not present

## 2023-05-18 DIAGNOSIS — Z96612 Presence of left artificial shoulder joint: Secondary | ICD-10-CM | POA: Diagnosis not present

## 2023-05-18 DIAGNOSIS — K5900 Constipation, unspecified: Secondary | ICD-10-CM | POA: Diagnosis not present

## 2023-05-18 DIAGNOSIS — Z860100 Personal history of colon polyps, unspecified: Secondary | ICD-10-CM | POA: Diagnosis not present

## 2023-05-18 NOTE — Telephone Encounter (Signed)
 ERx

## 2023-05-18 NOTE — Telephone Encounter (Signed)
 Patient's daughter Steward Drone called, left VM to return the call to the office. Unsure of the questions regarding the refill atenolol. It was sent to [facility] today. If it needs to be sent to a different pharmacy, obtain the name and resend request to the provider.  Copied from CRM [id number]. Topic: Clinical - Medication Question >> May 18, 2023 10:29 AM [place] E wrote: Reason for CRM: Patient's daughter, Steward Drone, called in regarding her mom's prescription for atenolol (TENORMIN) 50 MG tablet medication. Agent could see that a request for refill was put in today 4/2, and checking to see if PCP has signed off on that order. Callback number for Steward Drone is [phone] to confirm.

## 2023-05-18 NOTE — Telephone Encounter (Signed)
 Copied from CRM [id number]. Topic: Clinical - Medication Question >> May 18, 2023 10:29 AM [place] E wrote: Reason for CRM: Patient's daughter, Steward Drone, called in regarding her mom's prescription for atenolol (TENORMIN) 50 MG tablet medication. Agent could see that a request for refill was put in today 4/2, and checking to see if PCP has signed off on that order. Callback number for Steward Drone is [phone] to confirm. >> May 18, 2023  1:29 PM [place] J wrote: Steward Drone calling back. I let her know that medication was sent out today through [facility].

## 2023-05-19 DIAGNOSIS — Z556 Problems related to health literacy: Secondary | ICD-10-CM | POA: Diagnosis not present

## 2023-05-19 DIAGNOSIS — I951 Orthostatic hypotension: Secondary | ICD-10-CM | POA: Diagnosis not present

## 2023-05-19 DIAGNOSIS — Z96612 Presence of left artificial shoulder joint: Secondary | ICD-10-CM | POA: Diagnosis not present

## 2023-05-19 DIAGNOSIS — K5900 Constipation, unspecified: Secondary | ICD-10-CM | POA: Diagnosis not present

## 2023-05-19 DIAGNOSIS — E119 Type 2 diabetes mellitus without complications: Secondary | ICD-10-CM | POA: Diagnosis not present

## 2023-05-19 DIAGNOSIS — Z7982 Long term (current) use of aspirin: Secondary | ICD-10-CM | POA: Diagnosis not present

## 2023-05-19 DIAGNOSIS — Z8616 Personal history of COVID-19: Secondary | ICD-10-CM | POA: Diagnosis not present

## 2023-05-19 DIAGNOSIS — E7800 Pure hypercholesterolemia, unspecified: Secondary | ICD-10-CM | POA: Diagnosis not present

## 2023-05-19 DIAGNOSIS — H409 Unspecified glaucoma: Secondary | ICD-10-CM | POA: Diagnosis not present

## 2023-05-19 DIAGNOSIS — Z860100 Personal history of colon polyps, unspecified: Secondary | ICD-10-CM | POA: Diagnosis not present

## 2023-05-19 DIAGNOSIS — E079 Disorder of thyroid, unspecified: Secondary | ICD-10-CM | POA: Diagnosis not present

## 2023-05-19 DIAGNOSIS — Z471 Aftercare following joint replacement surgery: Secondary | ICD-10-CM | POA: Diagnosis not present

## 2023-05-19 DIAGNOSIS — I10 Essential (primary) hypertension: Secondary | ICD-10-CM | POA: Diagnosis not present

## 2023-05-20 DIAGNOSIS — E079 Disorder of thyroid, unspecified: Secondary | ICD-10-CM | POA: Diagnosis not present

## 2023-05-20 DIAGNOSIS — I951 Orthostatic hypotension: Secondary | ICD-10-CM | POA: Diagnosis not present

## 2023-05-20 DIAGNOSIS — Z8616 Personal history of COVID-19: Secondary | ICD-10-CM | POA: Diagnosis not present

## 2023-05-20 DIAGNOSIS — H409 Unspecified glaucoma: Secondary | ICD-10-CM | POA: Diagnosis not present

## 2023-05-20 DIAGNOSIS — Z471 Aftercare following joint replacement surgery: Secondary | ICD-10-CM | POA: Diagnosis not present

## 2023-05-20 DIAGNOSIS — E7800 Pure hypercholesterolemia, unspecified: Secondary | ICD-10-CM | POA: Diagnosis not present

## 2023-05-20 DIAGNOSIS — E119 Type 2 diabetes mellitus without complications: Secondary | ICD-10-CM | POA: Diagnosis not present

## 2023-05-20 DIAGNOSIS — Z860100 Personal history of colon polyps, unspecified: Secondary | ICD-10-CM | POA: Diagnosis not present

## 2023-05-20 DIAGNOSIS — Z556 Problems related to health literacy: Secondary | ICD-10-CM | POA: Diagnosis not present

## 2023-05-20 DIAGNOSIS — K5900 Constipation, unspecified: Secondary | ICD-10-CM | POA: Diagnosis not present

## 2023-05-20 DIAGNOSIS — Z7982 Long term (current) use of aspirin: Secondary | ICD-10-CM | POA: Diagnosis not present

## 2023-05-20 DIAGNOSIS — Z96612 Presence of left artificial shoulder joint: Secondary | ICD-10-CM | POA: Diagnosis not present

## 2023-05-20 DIAGNOSIS — I10 Essential (primary) hypertension: Secondary | ICD-10-CM | POA: Diagnosis not present

## 2023-05-23 DIAGNOSIS — I10 Essential (primary) hypertension: Secondary | ICD-10-CM | POA: Diagnosis not present

## 2023-05-23 DIAGNOSIS — I951 Orthostatic hypotension: Secondary | ICD-10-CM | POA: Diagnosis not present

## 2023-05-23 DIAGNOSIS — E119 Type 2 diabetes mellitus without complications: Secondary | ICD-10-CM | POA: Diagnosis not present

## 2023-05-23 DIAGNOSIS — K5900 Constipation, unspecified: Secondary | ICD-10-CM | POA: Diagnosis not present

## 2023-05-23 DIAGNOSIS — Z471 Aftercare following joint replacement surgery: Secondary | ICD-10-CM | POA: Diagnosis not present

## 2023-05-23 DIAGNOSIS — Z8616 Personal history of COVID-19: Secondary | ICD-10-CM | POA: Diagnosis not present

## 2023-05-23 DIAGNOSIS — Z860100 Personal history of colon polyps, unspecified: Secondary | ICD-10-CM | POA: Diagnosis not present

## 2023-05-23 DIAGNOSIS — E079 Disorder of thyroid, unspecified: Secondary | ICD-10-CM | POA: Diagnosis not present

## 2023-05-23 DIAGNOSIS — Z7982 Long term (current) use of aspirin: Secondary | ICD-10-CM | POA: Diagnosis not present

## 2023-05-23 DIAGNOSIS — Z96612 Presence of left artificial shoulder joint: Secondary | ICD-10-CM | POA: Diagnosis not present

## 2023-05-23 DIAGNOSIS — Z556 Problems related to health literacy: Secondary | ICD-10-CM | POA: Diagnosis not present

## 2023-05-23 DIAGNOSIS — E7800 Pure hypercholesterolemia, unspecified: Secondary | ICD-10-CM | POA: Diagnosis not present

## 2023-05-23 DIAGNOSIS — H409 Unspecified glaucoma: Secondary | ICD-10-CM | POA: Diagnosis not present

## 2023-05-24 DIAGNOSIS — I951 Orthostatic hypotension: Secondary | ICD-10-CM | POA: Diagnosis not present

## 2023-05-24 DIAGNOSIS — Z471 Aftercare following joint replacement surgery: Secondary | ICD-10-CM | POA: Diagnosis not present

## 2023-05-24 DIAGNOSIS — Z7982 Long term (current) use of aspirin: Secondary | ICD-10-CM | POA: Diagnosis not present

## 2023-05-24 DIAGNOSIS — E119 Type 2 diabetes mellitus without complications: Secondary | ICD-10-CM | POA: Diagnosis not present

## 2023-05-24 DIAGNOSIS — Z556 Problems related to health literacy: Secondary | ICD-10-CM | POA: Diagnosis not present

## 2023-05-24 DIAGNOSIS — Z8616 Personal history of COVID-19: Secondary | ICD-10-CM | POA: Diagnosis not present

## 2023-05-24 DIAGNOSIS — E079 Disorder of thyroid, unspecified: Secondary | ICD-10-CM | POA: Diagnosis not present

## 2023-05-24 DIAGNOSIS — K5900 Constipation, unspecified: Secondary | ICD-10-CM | POA: Diagnosis not present

## 2023-05-24 DIAGNOSIS — E7800 Pure hypercholesterolemia, unspecified: Secondary | ICD-10-CM | POA: Diagnosis not present

## 2023-05-24 DIAGNOSIS — H409 Unspecified glaucoma: Secondary | ICD-10-CM | POA: Diagnosis not present

## 2023-05-24 DIAGNOSIS — I10 Essential (primary) hypertension: Secondary | ICD-10-CM | POA: Diagnosis not present

## 2023-05-24 DIAGNOSIS — Z96612 Presence of left artificial shoulder joint: Secondary | ICD-10-CM | POA: Diagnosis not present

## 2023-05-24 DIAGNOSIS — Z860100 Personal history of colon polyps, unspecified: Secondary | ICD-10-CM | POA: Diagnosis not present

## 2023-05-25 DIAGNOSIS — K5900 Constipation, unspecified: Secondary | ICD-10-CM | POA: Diagnosis not present

## 2023-05-25 DIAGNOSIS — E7800 Pure hypercholesterolemia, unspecified: Secondary | ICD-10-CM | POA: Diagnosis not present

## 2023-05-25 DIAGNOSIS — Z471 Aftercare following joint replacement surgery: Secondary | ICD-10-CM | POA: Diagnosis not present

## 2023-05-25 DIAGNOSIS — Z8616 Personal history of COVID-19: Secondary | ICD-10-CM | POA: Diagnosis not present

## 2023-05-25 DIAGNOSIS — I10 Essential (primary) hypertension: Secondary | ICD-10-CM | POA: Diagnosis not present

## 2023-05-25 DIAGNOSIS — Z7982 Long term (current) use of aspirin: Secondary | ICD-10-CM | POA: Diagnosis not present

## 2023-05-25 DIAGNOSIS — H409 Unspecified glaucoma: Secondary | ICD-10-CM | POA: Diagnosis not present

## 2023-05-25 DIAGNOSIS — Z860100 Personal history of colon polyps, unspecified: Secondary | ICD-10-CM | POA: Diagnosis not present

## 2023-05-25 DIAGNOSIS — I951 Orthostatic hypotension: Secondary | ICD-10-CM | POA: Diagnosis not present

## 2023-05-25 DIAGNOSIS — E119 Type 2 diabetes mellitus without complications: Secondary | ICD-10-CM | POA: Diagnosis not present

## 2023-05-25 DIAGNOSIS — E079 Disorder of thyroid, unspecified: Secondary | ICD-10-CM | POA: Diagnosis not present

## 2023-05-25 DIAGNOSIS — Z556 Problems related to health literacy: Secondary | ICD-10-CM | POA: Diagnosis not present

## 2023-05-25 DIAGNOSIS — Z96612 Presence of left artificial shoulder joint: Secondary | ICD-10-CM | POA: Diagnosis not present

## 2023-05-30 DIAGNOSIS — Z471 Aftercare following joint replacement surgery: Secondary | ICD-10-CM | POA: Diagnosis not present

## 2023-05-30 DIAGNOSIS — Z556 Problems related to health literacy: Secondary | ICD-10-CM | POA: Diagnosis not present

## 2023-05-30 DIAGNOSIS — E079 Disorder of thyroid, unspecified: Secondary | ICD-10-CM | POA: Diagnosis not present

## 2023-05-30 DIAGNOSIS — H409 Unspecified glaucoma: Secondary | ICD-10-CM | POA: Diagnosis not present

## 2023-05-30 DIAGNOSIS — Z7982 Long term (current) use of aspirin: Secondary | ICD-10-CM | POA: Diagnosis not present

## 2023-05-30 DIAGNOSIS — I951 Orthostatic hypotension: Secondary | ICD-10-CM | POA: Diagnosis not present

## 2023-05-30 DIAGNOSIS — E7800 Pure hypercholesterolemia, unspecified: Secondary | ICD-10-CM | POA: Diagnosis not present

## 2023-05-30 DIAGNOSIS — Z860100 Personal history of colon polyps, unspecified: Secondary | ICD-10-CM | POA: Diagnosis not present

## 2023-05-30 DIAGNOSIS — Z96612 Presence of left artificial shoulder joint: Secondary | ICD-10-CM | POA: Diagnosis not present

## 2023-05-30 DIAGNOSIS — K5900 Constipation, unspecified: Secondary | ICD-10-CM | POA: Diagnosis not present

## 2023-05-30 DIAGNOSIS — Z8616 Personal history of COVID-19: Secondary | ICD-10-CM | POA: Diagnosis not present

## 2023-05-30 DIAGNOSIS — I10 Essential (primary) hypertension: Secondary | ICD-10-CM | POA: Diagnosis not present

## 2023-05-30 DIAGNOSIS — E119 Type 2 diabetes mellitus without complications: Secondary | ICD-10-CM | POA: Diagnosis not present

## 2023-06-02 DIAGNOSIS — E079 Disorder of thyroid, unspecified: Secondary | ICD-10-CM | POA: Diagnosis not present

## 2023-06-02 DIAGNOSIS — Z8616 Personal history of COVID-19: Secondary | ICD-10-CM | POA: Diagnosis not present

## 2023-06-02 DIAGNOSIS — Z471 Aftercare following joint replacement surgery: Secondary | ICD-10-CM | POA: Diagnosis not present

## 2023-06-02 DIAGNOSIS — E7800 Pure hypercholesterolemia, unspecified: Secondary | ICD-10-CM | POA: Diagnosis not present

## 2023-06-02 DIAGNOSIS — E119 Type 2 diabetes mellitus without complications: Secondary | ICD-10-CM | POA: Diagnosis not present

## 2023-06-02 DIAGNOSIS — Z96612 Presence of left artificial shoulder joint: Secondary | ICD-10-CM | POA: Diagnosis not present

## 2023-06-02 DIAGNOSIS — I951 Orthostatic hypotension: Secondary | ICD-10-CM | POA: Diagnosis not present

## 2023-06-02 DIAGNOSIS — Z556 Problems related to health literacy: Secondary | ICD-10-CM | POA: Diagnosis not present

## 2023-06-02 DIAGNOSIS — K5900 Constipation, unspecified: Secondary | ICD-10-CM | POA: Diagnosis not present

## 2023-06-02 DIAGNOSIS — Z860100 Personal history of colon polyps, unspecified: Secondary | ICD-10-CM | POA: Diagnosis not present

## 2023-06-02 DIAGNOSIS — I10 Essential (primary) hypertension: Secondary | ICD-10-CM | POA: Diagnosis not present

## 2023-06-02 DIAGNOSIS — H409 Unspecified glaucoma: Secondary | ICD-10-CM | POA: Diagnosis not present

## 2023-06-02 DIAGNOSIS — Z7982 Long term (current) use of aspirin: Secondary | ICD-10-CM | POA: Diagnosis not present

## 2023-06-06 DIAGNOSIS — E119 Type 2 diabetes mellitus without complications: Secondary | ICD-10-CM | POA: Diagnosis not present

## 2023-06-06 DIAGNOSIS — H409 Unspecified glaucoma: Secondary | ICD-10-CM | POA: Diagnosis not present

## 2023-06-06 DIAGNOSIS — K5900 Constipation, unspecified: Secondary | ICD-10-CM | POA: Diagnosis not present

## 2023-06-06 DIAGNOSIS — Z96612 Presence of left artificial shoulder joint: Secondary | ICD-10-CM | POA: Diagnosis not present

## 2023-06-06 DIAGNOSIS — I10 Essential (primary) hypertension: Secondary | ICD-10-CM | POA: Diagnosis not present

## 2023-06-06 DIAGNOSIS — Z7982 Long term (current) use of aspirin: Secondary | ICD-10-CM | POA: Diagnosis not present

## 2023-06-06 DIAGNOSIS — Z471 Aftercare following joint replacement surgery: Secondary | ICD-10-CM | POA: Diagnosis not present

## 2023-06-06 DIAGNOSIS — E079 Disorder of thyroid, unspecified: Secondary | ICD-10-CM | POA: Diagnosis not present

## 2023-06-06 DIAGNOSIS — Z556 Problems related to health literacy: Secondary | ICD-10-CM | POA: Diagnosis not present

## 2023-06-06 DIAGNOSIS — Z8616 Personal history of COVID-19: Secondary | ICD-10-CM | POA: Diagnosis not present

## 2023-06-06 DIAGNOSIS — Z860100 Personal history of colon polyps, unspecified: Secondary | ICD-10-CM | POA: Diagnosis not present

## 2023-06-06 DIAGNOSIS — I951 Orthostatic hypotension: Secondary | ICD-10-CM | POA: Diagnosis not present

## 2023-06-06 DIAGNOSIS — E7800 Pure hypercholesterolemia, unspecified: Secondary | ICD-10-CM | POA: Diagnosis not present

## 2023-06-09 DIAGNOSIS — I951 Orthostatic hypotension: Secondary | ICD-10-CM | POA: Diagnosis not present

## 2023-06-09 DIAGNOSIS — K5900 Constipation, unspecified: Secondary | ICD-10-CM | POA: Diagnosis not present

## 2023-06-09 DIAGNOSIS — Z8616 Personal history of COVID-19: Secondary | ICD-10-CM | POA: Diagnosis not present

## 2023-06-09 DIAGNOSIS — E7800 Pure hypercholesterolemia, unspecified: Secondary | ICD-10-CM | POA: Diagnosis not present

## 2023-06-09 DIAGNOSIS — Z7982 Long term (current) use of aspirin: Secondary | ICD-10-CM | POA: Diagnosis not present

## 2023-06-09 DIAGNOSIS — Z471 Aftercare following joint replacement surgery: Secondary | ICD-10-CM | POA: Diagnosis not present

## 2023-06-09 DIAGNOSIS — E119 Type 2 diabetes mellitus without complications: Secondary | ICD-10-CM | POA: Diagnosis not present

## 2023-06-09 DIAGNOSIS — I10 Essential (primary) hypertension: Secondary | ICD-10-CM | POA: Diagnosis not present

## 2023-06-09 DIAGNOSIS — Z556 Problems related to health literacy: Secondary | ICD-10-CM | POA: Diagnosis not present

## 2023-06-09 DIAGNOSIS — Z96612 Presence of left artificial shoulder joint: Secondary | ICD-10-CM | POA: Diagnosis not present

## 2023-06-09 DIAGNOSIS — Z860100 Personal history of colon polyps, unspecified: Secondary | ICD-10-CM | POA: Diagnosis not present

## 2023-06-09 DIAGNOSIS — E079 Disorder of thyroid, unspecified: Secondary | ICD-10-CM | POA: Diagnosis not present

## 2023-06-09 DIAGNOSIS — H409 Unspecified glaucoma: Secondary | ICD-10-CM | POA: Diagnosis not present

## 2023-06-10 ENCOUNTER — Telehealth: Payer: Self-pay

## 2023-06-10 DIAGNOSIS — Z471 Aftercare following joint replacement surgery: Secondary | ICD-10-CM | POA: Diagnosis not present

## 2023-06-10 NOTE — Telephone Encounter (Signed)
 Called left message to call office,

## 2023-06-10 NOTE — Telephone Encounter (Signed)
 Copied from CRM [id number]. Topic: General - Other >> Jun 10, 2023  3:44 PM Dorisann Garre T wrote: Reason for CRM:  patient is calling in regarding a important matter she stated she didn't go into details

## 2023-06-13 ENCOUNTER — Encounter: Payer: Self-pay | Admitting: Family Medicine

## 2023-06-13 DIAGNOSIS — E7800 Pure hypercholesterolemia, unspecified: Secondary | ICD-10-CM | POA: Diagnosis not present

## 2023-06-13 DIAGNOSIS — Z7982 Long term (current) use of aspirin: Secondary | ICD-10-CM | POA: Diagnosis not present

## 2023-06-13 DIAGNOSIS — Z96612 Presence of left artificial shoulder joint: Secondary | ICD-10-CM | POA: Diagnosis not present

## 2023-06-13 DIAGNOSIS — I10 Essential (primary) hypertension: Secondary | ICD-10-CM | POA: Diagnosis not present

## 2023-06-13 DIAGNOSIS — E079 Disorder of thyroid, unspecified: Secondary | ICD-10-CM | POA: Diagnosis not present

## 2023-06-13 DIAGNOSIS — H409 Unspecified glaucoma: Secondary | ICD-10-CM | POA: Diagnosis not present

## 2023-06-13 DIAGNOSIS — E119 Type 2 diabetes mellitus without complications: Secondary | ICD-10-CM | POA: Diagnosis not present

## 2023-06-13 DIAGNOSIS — I951 Orthostatic hypotension: Secondary | ICD-10-CM | POA: Diagnosis not present

## 2023-06-13 DIAGNOSIS — Z8616 Personal history of COVID-19: Secondary | ICD-10-CM | POA: Diagnosis not present

## 2023-06-13 DIAGNOSIS — Z556 Problems related to health literacy: Secondary | ICD-10-CM | POA: Diagnosis not present

## 2023-06-13 DIAGNOSIS — Z471 Aftercare following joint replacement surgery: Secondary | ICD-10-CM | POA: Diagnosis not present

## 2023-06-13 DIAGNOSIS — Z860100 Personal history of colon polyps, unspecified: Secondary | ICD-10-CM | POA: Diagnosis not present

## 2023-06-13 DIAGNOSIS — K5900 Constipation, unspecified: Secondary | ICD-10-CM | POA: Diagnosis not present

## 2023-06-13 NOTE — Telephone Encounter (Signed)
 Lvm returning pt's call.

## 2023-06-14 NOTE — Telephone Encounter (Signed)
 Lvm returning pt's call.

## 2023-06-15 ENCOUNTER — Ambulatory Visit: Admitting: Pediatrics

## 2023-06-15 NOTE — Telephone Encounter (Signed)
 Lvm returning pt's call.   Closing encounter due to no response from pt.

## 2023-06-16 ENCOUNTER — Encounter: Payer: Self-pay | Admitting: Emergency Medicine

## 2023-06-16 ENCOUNTER — Ambulatory Visit: Admission: EM | Admit: 2023-06-16 | Discharge: 2023-06-16 | Disposition: A | Discharge status: Home / Self Care

## 2023-06-16 DIAGNOSIS — L84 Corns and callosities: Secondary | ICD-10-CM

## 2023-06-16 DIAGNOSIS — M1990 Unspecified osteoarthritis, unspecified site: Secondary | ICD-10-CM | POA: Diagnosis not present

## 2023-06-16 DIAGNOSIS — R030 Elevated blood-pressure reading, without diagnosis of hypertension: Secondary | ICD-10-CM | POA: Diagnosis not present

## 2023-06-16 DIAGNOSIS — E119 Type 2 diabetes mellitus without complications: Secondary | ICD-10-CM | POA: Diagnosis not present

## 2023-06-16 DIAGNOSIS — Z556 Problems related to health literacy: Secondary | ICD-10-CM | POA: Diagnosis not present

## 2023-06-16 DIAGNOSIS — Z7982 Long term (current) use of aspirin: Secondary | ICD-10-CM | POA: Diagnosis not present

## 2023-06-16 DIAGNOSIS — Z471 Aftercare following joint replacement surgery: Secondary | ICD-10-CM | POA: Diagnosis not present

## 2023-06-16 DIAGNOSIS — K5900 Constipation, unspecified: Secondary | ICD-10-CM | POA: Diagnosis not present

## 2023-06-16 DIAGNOSIS — M79674 Pain in right toe(s): Secondary | ICD-10-CM

## 2023-06-16 DIAGNOSIS — Z96612 Presence of left artificial shoulder joint: Secondary | ICD-10-CM | POA: Diagnosis not present

## 2023-06-16 DIAGNOSIS — I951 Orthostatic hypotension: Secondary | ICD-10-CM | POA: Diagnosis not present

## 2023-06-16 DIAGNOSIS — H409 Unspecified glaucoma: Secondary | ICD-10-CM | POA: Diagnosis not present

## 2023-06-16 DIAGNOSIS — Z8616 Personal history of COVID-19: Secondary | ICD-10-CM | POA: Diagnosis not present

## 2023-06-16 DIAGNOSIS — I10 Essential (primary) hypertension: Secondary | ICD-10-CM | POA: Diagnosis not present

## 2023-06-16 DIAGNOSIS — E7800 Pure hypercholesterolemia, unspecified: Secondary | ICD-10-CM | POA: Diagnosis not present

## 2023-06-16 DIAGNOSIS — Z860100 Personal history of colon polyps, unspecified: Secondary | ICD-10-CM | POA: Diagnosis not present

## 2023-06-16 DIAGNOSIS — E079 Disorder of thyroid, unspecified: Secondary | ICD-10-CM | POA: Diagnosis not present

## 2023-06-16 NOTE — ED Triage Notes (Signed)
 Pt presents with right big toe pain,swelling and redness x 3 months. Pt has been to Podiatry but she states he did not address her toe pain.
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
## 2023-06-16 NOTE — Discharge Instructions (Addendum)
 Follow up with your podiatrist,PCP Your toe does not look infected Rest,ice,elevate If you develop fever, worsening redness, worsening swelling, worsening pain go to Er for further evaluation Make sure to wear supportive closed toed shoes to prevent injury or trauma

## 2023-06-17 DIAGNOSIS — Z471 Aftercare following joint replacement surgery: Secondary | ICD-10-CM | POA: Diagnosis not present

## 2023-06-20 DIAGNOSIS — E7800 Pure hypercholesterolemia, unspecified: Secondary | ICD-10-CM | POA: Diagnosis not present

## 2023-06-20 DIAGNOSIS — Z860100 Personal history of colon polyps, unspecified: Secondary | ICD-10-CM | POA: Diagnosis not present

## 2023-06-20 DIAGNOSIS — I951 Orthostatic hypotension: Secondary | ICD-10-CM | POA: Diagnosis not present

## 2023-06-20 DIAGNOSIS — K5900 Constipation, unspecified: Secondary | ICD-10-CM | POA: Diagnosis not present

## 2023-06-20 DIAGNOSIS — E119 Type 2 diabetes mellitus without complications: Secondary | ICD-10-CM | POA: Diagnosis not present

## 2023-06-20 DIAGNOSIS — Z471 Aftercare following joint replacement surgery: Secondary | ICD-10-CM | POA: Diagnosis not present

## 2023-06-20 DIAGNOSIS — I10 Essential (primary) hypertension: Secondary | ICD-10-CM | POA: Diagnosis not present

## 2023-06-20 DIAGNOSIS — Z7982 Long term (current) use of aspirin: Secondary | ICD-10-CM | POA: Diagnosis not present

## 2023-06-20 DIAGNOSIS — H409 Unspecified glaucoma: Secondary | ICD-10-CM | POA: Diagnosis not present

## 2023-06-20 DIAGNOSIS — Z96612 Presence of left artificial shoulder joint: Secondary | ICD-10-CM | POA: Diagnosis not present

## 2023-06-20 DIAGNOSIS — Z556 Problems related to health literacy: Secondary | ICD-10-CM | POA: Diagnosis not present

## 2023-06-20 DIAGNOSIS — E079 Disorder of thyroid, unspecified: Secondary | ICD-10-CM | POA: Diagnosis not present

## 2023-06-20 DIAGNOSIS — Z8616 Personal history of COVID-19: Secondary | ICD-10-CM | POA: Diagnosis not present

## 2023-06-22 ENCOUNTER — Ambulatory Visit: Admitting: Obstetrics and Gynecology

## 2023-06-22 ENCOUNTER — Encounter: Payer: Self-pay | Admitting: Obstetrics and Gynecology

## 2023-06-22 VITALS — BP 120/74 | HR 59 | Ht 62.0 in | Wt 167.8 lb

## 2023-06-22 DIAGNOSIS — N838 Other noninflammatory disorders of ovary, fallopian tube and broad ligament: Secondary | ICD-10-CM | POA: Diagnosis not present

## 2023-06-22 NOTE — Progress Notes (Signed)
 Patient presents today to discuss her ovarian mass and surgery.
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
## 2023-06-23 DIAGNOSIS — Z96612 Presence of left artificial shoulder joint: Secondary | ICD-10-CM | POA: Diagnosis not present

## 2023-06-23 DIAGNOSIS — Z860100 Personal history of colon polyps, unspecified: Secondary | ICD-10-CM | POA: Diagnosis not present

## 2023-06-23 DIAGNOSIS — I10 Essential (primary) hypertension: Secondary | ICD-10-CM | POA: Diagnosis not present

## 2023-06-23 DIAGNOSIS — E7800 Pure hypercholesterolemia, unspecified: Secondary | ICD-10-CM | POA: Diagnosis not present

## 2023-06-23 DIAGNOSIS — Z471 Aftercare following joint replacement surgery: Secondary | ICD-10-CM | POA: Diagnosis not present

## 2023-06-23 DIAGNOSIS — E079 Disorder of thyroid, unspecified: Secondary | ICD-10-CM | POA: Diagnosis not present

## 2023-06-23 DIAGNOSIS — H409 Unspecified glaucoma: Secondary | ICD-10-CM | POA: Diagnosis not present

## 2023-06-23 DIAGNOSIS — Z7982 Long term (current) use of aspirin: Secondary | ICD-10-CM | POA: Diagnosis not present

## 2023-06-23 DIAGNOSIS — K5900 Constipation, unspecified: Secondary | ICD-10-CM | POA: Diagnosis not present

## 2023-06-23 DIAGNOSIS — Z556 Problems related to health literacy: Secondary | ICD-10-CM | POA: Diagnosis not present

## 2023-06-23 DIAGNOSIS — I951 Orthostatic hypotension: Secondary | ICD-10-CM | POA: Diagnosis not present

## 2023-06-23 DIAGNOSIS — Z8616 Personal history of COVID-19: Secondary | ICD-10-CM | POA: Diagnosis not present

## 2023-06-23 DIAGNOSIS — E119 Type 2 diabetes mellitus without complications: Secondary | ICD-10-CM | POA: Diagnosis not present

## 2023-06-24 ENCOUNTER — Encounter: Payer: Medicare Other | Admitting: Family Medicine

## 2023-06-27 DIAGNOSIS — Z96612 Presence of left artificial shoulder joint: Secondary | ICD-10-CM | POA: Diagnosis not present

## 2023-06-27 DIAGNOSIS — I951 Orthostatic hypotension: Secondary | ICD-10-CM | POA: Diagnosis not present

## 2023-06-27 DIAGNOSIS — E7800 Pure hypercholesterolemia, unspecified: Secondary | ICD-10-CM | POA: Diagnosis not present

## 2023-06-27 DIAGNOSIS — E079 Disorder of thyroid, unspecified: Secondary | ICD-10-CM | POA: Diagnosis not present

## 2023-06-27 DIAGNOSIS — Z7982 Long term (current) use of aspirin: Secondary | ICD-10-CM | POA: Diagnosis not present

## 2023-06-27 DIAGNOSIS — K5900 Constipation, unspecified: Secondary | ICD-10-CM | POA: Diagnosis not present

## 2023-06-27 DIAGNOSIS — H409 Unspecified glaucoma: Secondary | ICD-10-CM | POA: Diagnosis not present

## 2023-06-27 DIAGNOSIS — Z471 Aftercare following joint replacement surgery: Secondary | ICD-10-CM | POA: Diagnosis not present

## 2023-06-27 DIAGNOSIS — I10 Essential (primary) hypertension: Secondary | ICD-10-CM | POA: Diagnosis not present

## 2023-06-27 DIAGNOSIS — E119 Type 2 diabetes mellitus without complications: Secondary | ICD-10-CM | POA: Diagnosis not present

## 2023-06-27 DIAGNOSIS — Z556 Problems related to health literacy: Secondary | ICD-10-CM | POA: Diagnosis not present

## 2023-06-27 DIAGNOSIS — Z860100 Personal history of colon polyps, unspecified: Secondary | ICD-10-CM | POA: Diagnosis not present

## 2023-06-27 DIAGNOSIS — Z8616 Personal history of COVID-19: Secondary | ICD-10-CM | POA: Diagnosis not present

## 2023-06-29 DIAGNOSIS — M25612 Stiffness of left shoulder, not elsewhere classified: Secondary | ICD-10-CM | POA: Diagnosis not present

## 2023-06-29 DIAGNOSIS — Z96612 Presence of left artificial shoulder joint: Secondary | ICD-10-CM | POA: Diagnosis not present

## 2023-06-29 DIAGNOSIS — M6281 Muscle weakness (generalized): Secondary | ICD-10-CM | POA: Diagnosis not present

## 2023-06-29 DIAGNOSIS — M25512 Pain in left shoulder: Secondary | ICD-10-CM | POA: Diagnosis not present

## 2023-07-06 DIAGNOSIS — M25512 Pain in left shoulder: Secondary | ICD-10-CM | POA: Diagnosis not present

## 2023-07-06 DIAGNOSIS — M25612 Stiffness of left shoulder, not elsewhere classified: Secondary | ICD-10-CM | POA: Diagnosis not present

## 2023-07-06 DIAGNOSIS — M6281 Muscle weakness (generalized): Secondary | ICD-10-CM | POA: Diagnosis not present

## 2023-07-06 DIAGNOSIS — Z96612 Presence of left artificial shoulder joint: Secondary | ICD-10-CM | POA: Diagnosis not present

## 2023-07-07 ENCOUNTER — Other Ambulatory Visit: Payer: Self-pay | Admitting: Family Medicine

## 2023-07-07 DIAGNOSIS — E039 Hypothyroidism, unspecified: Secondary | ICD-10-CM

## 2023-07-08 DIAGNOSIS — M6281 Muscle weakness (generalized): Secondary | ICD-10-CM | POA: Diagnosis not present

## 2023-07-08 DIAGNOSIS — M25512 Pain in left shoulder: Secondary | ICD-10-CM | POA: Diagnosis not present

## 2023-07-08 DIAGNOSIS — Z96612 Presence of left artificial shoulder joint: Secondary | ICD-10-CM | POA: Diagnosis not present

## 2023-07-12 DIAGNOSIS — M6281 Muscle weakness (generalized): Secondary | ICD-10-CM | POA: Diagnosis not present

## 2023-07-12 DIAGNOSIS — M25512 Pain in left shoulder: Secondary | ICD-10-CM | POA: Diagnosis not present

## 2023-07-12 DIAGNOSIS — Z96612 Presence of left artificial shoulder joint: Secondary | ICD-10-CM | POA: Diagnosis not present

## 2023-07-12 DIAGNOSIS — M25612 Stiffness of left shoulder, not elsewhere classified: Secondary | ICD-10-CM | POA: Diagnosis not present

## 2023-07-15 ENCOUNTER — Other Ambulatory Visit: Payer: Self-pay | Admitting: Family Medicine

## 2023-07-15 DIAGNOSIS — M25612 Stiffness of left shoulder, not elsewhere classified: Secondary | ICD-10-CM | POA: Diagnosis not present

## 2023-07-15 DIAGNOSIS — E1142 Type 2 diabetes mellitus with diabetic polyneuropathy: Secondary | ICD-10-CM

## 2023-07-15 DIAGNOSIS — R1013 Epigastric pain: Secondary | ICD-10-CM

## 2023-07-15 DIAGNOSIS — M6281 Muscle weakness (generalized): Secondary | ICD-10-CM | POA: Diagnosis not present

## 2023-07-15 DIAGNOSIS — Z96612 Presence of left artificial shoulder joint: Secondary | ICD-10-CM | POA: Diagnosis not present

## 2023-07-15 DIAGNOSIS — M25512 Pain in left shoulder: Secondary | ICD-10-CM | POA: Diagnosis not present

## 2023-07-19 DIAGNOSIS — M25512 Pain in left shoulder: Secondary | ICD-10-CM | POA: Diagnosis not present

## 2023-07-19 DIAGNOSIS — Z96612 Presence of left artificial shoulder joint: Secondary | ICD-10-CM | POA: Diagnosis not present

## 2023-07-19 DIAGNOSIS — M6281 Muscle weakness (generalized): Secondary | ICD-10-CM | POA: Diagnosis not present

## 2023-07-19 DIAGNOSIS — M25612 Stiffness of left shoulder, not elsewhere classified: Secondary | ICD-10-CM | POA: Diagnosis not present

## 2023-07-21 DIAGNOSIS — M6281 Muscle weakness (generalized): Secondary | ICD-10-CM | POA: Diagnosis not present

## 2023-07-21 DIAGNOSIS — M25512 Pain in left shoulder: Secondary | ICD-10-CM | POA: Diagnosis not present

## 2023-07-21 DIAGNOSIS — M25612 Stiffness of left shoulder, not elsewhere classified: Secondary | ICD-10-CM | POA: Diagnosis not present

## 2023-07-21 DIAGNOSIS — Z96612 Presence of left artificial shoulder joint: Secondary | ICD-10-CM | POA: Diagnosis not present

## 2023-07-25 DIAGNOSIS — M25512 Pain in left shoulder: Secondary | ICD-10-CM | POA: Diagnosis not present

## 2023-07-25 DIAGNOSIS — M25612 Stiffness of left shoulder, not elsewhere classified: Secondary | ICD-10-CM | POA: Diagnosis not present

## 2023-07-25 DIAGNOSIS — Z96612 Presence of left artificial shoulder joint: Secondary | ICD-10-CM | POA: Diagnosis not present

## 2023-07-25 DIAGNOSIS — M6281 Muscle weakness (generalized): Secondary | ICD-10-CM | POA: Diagnosis not present

## 2023-07-29 ENCOUNTER — Encounter: Payer: Medicare Other | Admitting: Family Medicine

## 2023-08-01 DIAGNOSIS — M25512 Pain in left shoulder: Secondary | ICD-10-CM | POA: Diagnosis not present

## 2023-08-01 DIAGNOSIS — M25612 Stiffness of left shoulder, not elsewhere classified: Secondary | ICD-10-CM | POA: Diagnosis not present

## 2023-08-01 DIAGNOSIS — M6281 Muscle weakness (generalized): Secondary | ICD-10-CM | POA: Diagnosis not present

## 2023-08-01 DIAGNOSIS — Z96612 Presence of left artificial shoulder joint: Secondary | ICD-10-CM | POA: Diagnosis not present

## 2023-08-03 DIAGNOSIS — M25612 Stiffness of left shoulder, not elsewhere classified: Secondary | ICD-10-CM | POA: Diagnosis not present

## 2023-08-03 DIAGNOSIS — M25512 Pain in left shoulder: Secondary | ICD-10-CM | POA: Diagnosis not present

## 2023-08-03 DIAGNOSIS — Z96612 Presence of left artificial shoulder joint: Secondary | ICD-10-CM | POA: Diagnosis not present

## 2023-08-03 DIAGNOSIS — M6281 Muscle weakness (generalized): Secondary | ICD-10-CM | POA: Diagnosis not present

## 2023-08-05 ENCOUNTER — Ambulatory Visit: Admitting: Pediatrics

## 2023-08-05 ENCOUNTER — Encounter: Payer: Self-pay | Admitting: Pediatrics

## 2023-08-05 VITALS — BP 167/84 | HR 54 | Temp 97.4°F | Ht 63.0 in | Wt 169.0 lb

## 2023-08-05 DIAGNOSIS — E1142 Type 2 diabetes mellitus with diabetic polyneuropathy: Secondary | ICD-10-CM | POA: Diagnosis not present

## 2023-08-05 DIAGNOSIS — Z7689 Persons encountering health services in other specified circumstances: Secondary | ICD-10-CM | POA: Diagnosis not present

## 2023-08-05 DIAGNOSIS — N838 Other noninflammatory disorders of ovary, fallopian tube and broad ligament: Secondary | ICD-10-CM

## 2023-08-05 DIAGNOSIS — E119 Type 2 diabetes mellitus without complications: Secondary | ICD-10-CM | POA: Diagnosis not present

## 2023-08-05 DIAGNOSIS — E039 Hypothyroidism, unspecified: Secondary | ICD-10-CM | POA: Diagnosis not present

## 2023-08-05 DIAGNOSIS — I10 Essential (primary) hypertension: Secondary | ICD-10-CM | POA: Diagnosis not present

## 2023-08-05 DIAGNOSIS — L719 Rosacea, unspecified: Secondary | ICD-10-CM

## 2023-08-05 DIAGNOSIS — Z1330 Encounter for screening examination for mental health and behavioral disorders, unspecified: Secondary | ICD-10-CM | POA: Diagnosis not present

## 2023-08-05 DIAGNOSIS — L309 Dermatitis, unspecified: Secondary | ICD-10-CM

## 2023-08-05 MED ORDER — TRIAMCINOLONE ACETONIDE 0.1 % EX CREA: 1.0000 | TOPICAL_CREAM | Freq: Two times a day (BID) | CUTANEOUS | 0 refills | Status: DC
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
## 2023-08-05 NOTE — Patient Instructions (Addendum)
 Nos vemos julio 10 a las 220pm  Bienvenida a [employer]!  Como su mdico de atencin primaria, espero trabajar con usted para ayudarle a Barista sus objetivos de [place].  Tenga en cuenta un par de elementos logsticos: - Si me enva un mensaje a Clinical cytogeneticist, es posible que tarde [place] 1 y 2 das hbiles en responderle. Esto es para mensajes no urgentes.  - Si necesita atencin clnica urgente, llame a la clnica o presntese en la sala de urgencias/emergencias. - Si tiene laboratorios, Recruitment consultant un mensaje sobre ellos en 1 o 2 das hbiles. - No estoy aqu los lunes; de lo contrario, estar disponible de martes a viernes de 8 a.m. a 5 p.m.

## 2023-08-06 LAB — COMPREHENSIVE METABOLIC PANEL WITH GFR
ALT: 20 IU/L (ref 0–32)
AST: 15 IU/L (ref 0–40)
Albumin: 4.3 g/dL (ref 3.7–4.7)
Alkaline Phosphatase: 79 IU/L (ref 44–121)
BUN/Creatinine Ratio: 22 (ref 12–28)
BUN: 17 mg/dL (ref 8–27)
Bilirubin Total: 0.4 mg/dL (ref 0.0–1.2)
CO2: 24 mmol/L (ref 20–29)
Calcium: 9.3 mg/dL (ref 8.7–10.3)
Chloride: 103 mmol/L (ref 96–106)
Creatinine, Ser: 0.76 mg/dL (ref 0.57–1.00)
Globulin, Total: 2.6 g/dL (ref 1.5–4.5)
Glucose: 96 mg/dL (ref 70–99)
Potassium: 4.7 mmol/L (ref 3.5–5.2)
Sodium: 143 mmol/L (ref 134–144)
Total Protein: 6.9 g/dL (ref 6.0–8.5)
eGFR: 76 mL/min/{1.73_m2} (ref >59)

## 2023-08-06 LAB — LIPID PANEL
Chol/HDL Ratio: 2.6 ratio (ref 0.0–4.4)
Cholesterol, Total: 118 mg/dL (ref 100–199)
HDL: 46 mg/dL (ref >39)
LDL Chol Calc (NIH): 57 mg/dL (ref 0–99)
Triglycerides: 75 mg/dL (ref 0–149)
VLDL Cholesterol Cal: 15 mg/dL (ref 5–40)

## 2023-08-06 LAB — HEMOGLOBIN A1C
Est. average glucose Bld gHb Est-mCnc: 146 mg/dL
Hgb A1c MFr Bld: 6.7 % — ABNORMAL HIGH (ref 4.8–5.6)

## 2023-08-09 ENCOUNTER — Ambulatory Visit: Payer: Self-pay | Admitting: Pediatrics

## 2023-08-09 ENCOUNTER — Ambulatory Visit: Payer: Self-pay

## 2023-08-09 NOTE — Telephone Encounter (Signed)
 FYI Only or Action Required?: Action required by provider: update on patient condition.  Patient was last seen in primary care on 08/05/2023 by Herold Hadassah SQUIBB, MD. Called Nurse Triage reporting Hypertension. Symptoms began today. Interventions attempted: Prescription medications: Atenolol . Symptoms are: stable.  Triage Disposition: See PCP Within 2 Weeks  Patient/caregiver understands and will follow disposition?: Yes  Copied from CRM [id number]. Topic: Clinical - Red Word Triage >> Aug 09, 2023 11:40 AM [name] wrote: Red Word that prompted transfer to Nurse Triage: patient calling cause her blood pressure was 168/93 and then 156/88 this morning and she was told by her provider to call the office is her blood pressure is over 150-90 to call doctors office  Pt num [phone] Reason for Disposition  [1] Systolic BP  >= 130 OR Diastolic >= 80 AND [2] taking BP medications  Answer Assessment - Initial Assessment Questions Pt was told to call PCP office if BP's got above 150/90's. This morning, Pt reports only has s/s of mild fatigue, can still get around, no other cardiac s/s present, pt reports taking her BP this morning.  BP just now with nurse on the phone: 162/84, HR 68.  Nurse explained to pt, PCP will be notified of this update, pt confirmed her Mychart is active and she is able to access it. Nurse explained PCP office will contact pt by phone call or through Mychart for updates on what PCP recommends pt to do, pt verbalizes understanding. Pt also educated if BP's get higher or pt develops any of the cardiac s/s they discussed on phone to seek UC/ED, pt verbalizes understanding.   1. BLOOD PRESSURE: What is the blood pressure? Did you take at least two measurements 5 minutes apart?     BP's from this morning: 168/93, 156/88 2. ONSET: When did you take your blood pressure?     Today 3. HOW: How did you take your blood pressure? (e.g., automatic home BP monitor, visiting nurse)      Daily 4. HISTORY: Do you have a history of high blood pressure?     Yes 5. MEDICINES: Are you taking any medicines for blood pressure? Have you missed any doses recently?     Atenolol  6. OTHER SYMPTOMS: Do you have any symptoms? (e.g., blurred vision, chest pain, difficulty breathing, headache, weakness)     Some tired, not too much energy. No H/A, no blur vision, maybe a little Sob, pt states she can walk and do everything. No other s/s reported.  Protocols used: Blood Pressure - High-A-AH

## 2023-08-09 NOTE — Telephone Encounter (Signed)
 Patient needs to be scheduled sooner than 7/10 for BP follow up

## 2023-08-10 ENCOUNTER — Encounter: Payer: Self-pay | Admitting: Pediatrics

## 2023-08-10 ENCOUNTER — Ambulatory Visit (INDEPENDENT_AMBULATORY_CARE_PROVIDER_SITE_OTHER): Admitting: Pediatrics

## 2023-08-10 VITALS — BP 162/81 | HR 59 | Temp 97.8°F | Wt 168.6 lb

## 2023-08-10 DIAGNOSIS — R001 Bradycardia, unspecified: Secondary | ICD-10-CM | POA: Diagnosis not present

## 2023-08-10 DIAGNOSIS — R002 Palpitations: Secondary | ICD-10-CM | POA: Diagnosis not present

## 2023-08-10 DIAGNOSIS — R400 Somnolence: Secondary | ICD-10-CM

## 2023-08-10 DIAGNOSIS — I10 Essential (primary) hypertension: Secondary | ICD-10-CM

## 2023-08-10 DIAGNOSIS — E039 Hypothyroidism, unspecified: Secondary | ICD-10-CM

## 2023-08-10 DIAGNOSIS — E1142 Type 2 diabetes mellitus with diabetic polyneuropathy: Secondary | ICD-10-CM | POA: Diagnosis not present

## 2023-08-10 MED ORDER — AMLODIPINE BESYLATE 5 MG PO TABS: 5.0000 mg | ORAL_TABLET | Freq: Every day | ORAL | 3 refills | Status: DC

## 2023-08-10 MED ORDER — ATORVASTATIN CALCIUM 20 MG PO TABS: 20.0000 mg | ORAL_TABLET | ORAL | 3 refills | Status: AC

## 2023-08-10 MED ORDER — ATENOLOL 50 MG PO TABS: 25.0000 mg | ORAL_TABLET | Freq: Two times a day (BID) | ORAL | 1 refills | Status: DC

## 2023-08-10 NOTE — Patient Instructions (Addendum)
 Nueva medicina: amlodipine  5mg  diariamente  Nos vemos julio 10
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
## 2023-08-10 NOTE — Assessment & Plan Note (Signed)
 Reports elevated blood pressure. Current regimen includes atenolol . Previously on amolodipine and ARB but discontinued dur to low BP. Home readings >140/90. Atenolol  may be cause of fatigue and bradycardia. Plan to optimize regimen with amlodipine  due to fewer side effects. EKG reassuring, NSR. - Initiate amlodipine  at 5 mg. - Check electrolytes and thyroid  function. - Consider gradual discontinuation of atenolol  if blood pressure is controlled with amlodipine . Would probably need to add ARB (olmesartan)

## 2023-08-11 LAB — BRAIN NATRIURETIC PEPTIDE

## 2023-08-12 DIAGNOSIS — Z96612 Presence of left artificial shoulder joint: Secondary | ICD-10-CM | POA: Diagnosis not present

## 2023-08-12 LAB — BASIC METABOLIC PANEL WITH GFR
BUN/Creatinine Ratio: 22 (ref 12–28)
BUN: 19 mg/dL (ref 8–27)
CO2: 19 mmol/L — ABNORMAL LOW (ref 20–29)
Calcium: 9.4 mg/dL (ref 8.7–10.3)
Chloride: 100 mmol/L (ref 96–106)
Creatinine, Ser: 0.86 mg/dL (ref 0.57–1.00)
Glucose: 89 mg/dL (ref 70–99)
Potassium: 4.7 mmol/L (ref 3.5–5.2)
Sodium: 140 mmol/L (ref 134–144)
eGFR: 66 mL/min/{1.73_m2} (ref >59)

## 2023-08-12 LAB — MAGNESIUM: Magnesium: 2.1 mg/dL (ref 1.6–2.3)

## 2023-08-12 LAB — THYROID PANEL WITH TSH
Free Thyroxine Index: 2.2 (ref 1.2–4.9)
T3 Uptake Ratio: 32 % (ref 24–39)
T4, Total: 6.9 ug/dL (ref 4.5–12.0)
TSH: 3.38 u[IU]/mL (ref 0.450–4.500)

## 2023-08-15 ENCOUNTER — Encounter: Payer: Self-pay | Admitting: Pediatrics

## 2023-08-15 ENCOUNTER — Ambulatory Visit: Payer: Self-pay | Admitting: Pediatrics

## 2023-08-15 DIAGNOSIS — L309 Dermatitis, unspecified: Secondary | ICD-10-CM | POA: Insufficient documentation

## 2023-08-15 DIAGNOSIS — E119 Type 2 diabetes mellitus without complications: Secondary | ICD-10-CM | POA: Insufficient documentation

## 2023-08-15 DIAGNOSIS — Z96612 Presence of left artificial shoulder joint: Secondary | ICD-10-CM | POA: Diagnosis not present

## 2023-08-15 DIAGNOSIS — G4730 Sleep apnea, unspecified: Secondary | ICD-10-CM | POA: Diagnosis not present

## 2023-08-15 NOTE — Assessment & Plan Note (Signed)
 Chronic hypertension with fluctuating levels. Currently on atenolol , adjusted to half a pill twice daily. Monitoring crucial, cardiology referral considered if readings exceed 140/90 mmHg. - Implement a two-week blood pressure monitoring regimen to assess home readings.

## 2023-08-15 NOTE — Assessment & Plan Note (Signed)
 Chronic condition managed with levothyroxine , recently adjusted to 110 mcg. She reports feeling well on the current dose.

## 2023-08-15 NOTE — Assessment & Plan Note (Addendum)
 Symptoms include numbness and cramps in feet, likely related to long-term diabetes. No medication due to limited efficacy. - Consider referral to a podiatrist for annual foot examination and management of calluses and other foot issues.

## 2023-08-15 NOTE — Assessment & Plan Note (Signed)
 Requesting triamcinolone  refills. Sent below.

## 2023-08-15 NOTE — Assessment & Plan Note (Signed)
 Long-standing diabetes managed with diet. HbA1c maintained at 6.7. Emphasized keeping HbA1c below 7 and discussed hypoglycemia risks. - Check HbA1c level today to assess current diabetes control. - Continue current dietary management for diabetes.

## 2023-08-15 NOTE — Assessment & Plan Note (Signed)
 Chronic facial redness and occasional bumps. Current medication effective without adverse effects. Condition well-managed. - Prescribe a larger supply of the current medication for rosacea.

## 2023-08-15 NOTE — Assessment & Plan Note (Addendum)
 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 Team suspect benign tumor. Surgical removal recommended.  -  Dr. Janit on July 9 to discuss surgical plan for tumor removal.

## 2023-08-16 ENCOUNTER — Encounter: Admitting: Obstetrics and Gynecology

## 2023-08-16 DIAGNOSIS — Z96612 Presence of left artificial shoulder joint: Secondary | ICD-10-CM | POA: Diagnosis not present

## 2023-08-16 DIAGNOSIS — M25512 Pain in left shoulder: Secondary | ICD-10-CM | POA: Diagnosis not present

## 2023-08-22 DIAGNOSIS — M25612 Stiffness of left shoulder, not elsewhere classified: Secondary | ICD-10-CM | POA: Diagnosis not present

## 2023-08-22 DIAGNOSIS — Z96612 Presence of left artificial shoulder joint: Secondary | ICD-10-CM | POA: Diagnosis not present

## 2023-08-22 DIAGNOSIS — M25512 Pain in left shoulder: Secondary | ICD-10-CM | POA: Diagnosis not present

## 2023-08-22 DIAGNOSIS — M6281 Muscle weakness (generalized): Secondary | ICD-10-CM | POA: Diagnosis not present

## 2023-08-24 ENCOUNTER — Ambulatory Visit: Admitting: Obstetrics and Gynecology

## 2023-08-24 ENCOUNTER — Encounter: Payer: Self-pay | Admitting: Obstetrics and Gynecology

## 2023-08-24 VITALS — BP 122/78 | HR 65 | Ht 63.0 in | Wt 171.1 lb

## 2023-08-24 DIAGNOSIS — N838 Other noninflammatory disorders of ovary, fallopian tube and broad ligament: Secondary | ICD-10-CM | POA: Diagnosis not present

## 2023-08-24 DIAGNOSIS — Z96612 Presence of left artificial shoulder joint: Secondary | ICD-10-CM | POA: Diagnosis not present

## 2023-08-24 DIAGNOSIS — M25512 Pain in left shoulder: Secondary | ICD-10-CM | POA: Diagnosis not present

## 2023-08-24 DIAGNOSIS — Z01818 Encounter for other preprocedural examination: Secondary | ICD-10-CM

## 2023-08-24 DIAGNOSIS — M6281 Muscle weakness (generalized): Secondary | ICD-10-CM | POA: Diagnosis not present

## 2023-08-24 DIAGNOSIS — M25612 Stiffness of left shoulder, not elsewhere classified: Secondary | ICD-10-CM | POA: Diagnosis not present

## 2023-08-24 NOTE — Progress Notes (Signed)
Patient presents today for a pre-op exam prior to BSO. She states no additional concerns today.
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
## 2023-08-25 ENCOUNTER — Ambulatory Visit: Admitting: Pediatrics

## 2023-08-25 ENCOUNTER — Encounter: Payer: Self-pay | Admitting: Pediatrics

## 2023-08-25 ENCOUNTER — Other Ambulatory Visit: Payer: Self-pay

## 2023-08-25 VITALS — BP 108/70 | HR 56 | Temp 97.8°F | Wt 171.4 lb

## 2023-08-25 DIAGNOSIS — I10 Essential (primary) hypertension: Secondary | ICD-10-CM | POA: Diagnosis not present

## 2023-08-25 DIAGNOSIS — Z0000 Encounter for general adult medical examination without abnormal findings: Secondary | ICD-10-CM | POA: Diagnosis not present

## 2023-08-25 MED ORDER — ACCU-CHEK SOFTCLIX LANCETS MISC: 12 refills | Status: AC

## 2023-08-25 MED ORDER — ACCU-CHEK GUIDE TEST VI STRP
ORAL_STRIP | 12 refills | Status: AC
Start: 2023-08-25 — End: ?

## 2023-08-25 MED ORDER — ACCU-CHEK GUIDE ME W/DEVICE KIT: 1.0000 | PACK | Freq: Every day | 0 refills | Status: AC

## 2023-08-25 NOTE — Patient Instructions (Addendum)
 Atenolol : Bajar atenolol  a una vez al dia por [employer]. Despues de [employer], puede descontinuar completamente.  Vamos a comenza olmesartan 20mg  diario. Toma la mitad mientras estas tomando el atenolol . Una vez descontinues el atenolol  puedes tomarte la [place].  [name] , Thank you for taking time to come for your Medicare Wellness Visit. I appreciate your ongoing commitment to your health goals. Please review the following plan we discussed and let me know if I can assist you in the future.   These are the goals we discussed:  Goals      Patient Stated     Starting 01/31/2018, I will continue to exercise on stationary bike for 30 minutes daily.      Patient Stated     02/02/2019, I will maintain and continue medications as prescribed.     Patient Stated     No new goals     Track and Manage My Blood Pressure-Hypertension     Timeframe:  Long-Range Goal Priority:  High Start Date:     02/05/21                        Expected End Date:   02/05/22                    Follow Up Date Jan 2023   - check blood pressure daily - choose a place to take my blood pressure (home, clinic or office, retail store) - write blood pressure results in a log or diary    Why is this important?   You won't feel high blood pressure, but it can still hurt your blood vessels.  High blood pressure can cause heart or kidney problems. It can also cause a stroke.  Making lifestyle changes like losing a little weight or eating less salt will help.  Checking your blood pressure at home and at different times of the day can help to control blood pressure.  If the doctor prescribes medicine remember to take it the way the doctor ordered.  Call the office if you cannot afford the medicine or if there are questions about it.     Notes:         This is a list of the screening recommended for you and due dates:  Health Maintenance  Topic Date Due   Zoster (Shingles) Vaccine (1 of 2)  Never done   Eye exam for diabetics  01/26/2020   Flu Shot  09/16/2023   Complete foot exam   10/11/2023   Mammogram  01/11/2024   Hemoglobin A1C  02/04/2024   Medicare Annual Wellness Visit  08/09/2024   Pneumococcal Vaccine for age over 63  Completed   DEXA scan (bone density measurement)  Completed   COVID-19 Vaccine  Completed   Hepatitis B Vaccine  Aged Out   HPV Vaccine  Aged Out   Meningitis B Vaccine  Aged Out   DTaP/Tdap/Td vaccine  Discontinued
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
## 2023-08-29 ENCOUNTER — Telehealth: Payer: Self-pay

## 2023-08-29 NOTE — Telephone Encounter (Signed)
 Called and confirmed with pharmacy daily will close this encounter

## 2023-08-29 NOTE — Telephone Encounter (Signed)
Daily please

## 2023-08-29 NOTE — Telephone Encounter (Signed)
 Pharmacy is requesting the frequency required for the test scripts prescription to be processed please advise

## 2023-08-31 ENCOUNTER — Encounter
Admission: RE | Admit: 2023-08-31 | Discharge: 2023-08-31 | Disposition: A | Discharge status: Home / Self Care | Source: Ambulatory Visit | Attending: Obstetrics and Gynecology | Admitting: Obstetrics and Gynecology

## 2023-08-31 ENCOUNTER — Other Ambulatory Visit: Payer: Self-pay

## 2023-08-31 VITALS — Ht 62.0 in | Wt 164.0 lb

## 2023-08-31 DIAGNOSIS — E119 Type 2 diabetes mellitus without complications: Secondary | ICD-10-CM

## 2023-08-31 DIAGNOSIS — I10 Essential (primary) hypertension: Secondary | ICD-10-CM

## 2023-08-31 HISTORY — DX: Dyspnea, unspecified: R06.00

## 2023-08-31 NOTE — Patient Instructions (Addendum)
 Su procedimiento est programado para: Lunes 09/05/23 Presntese en el mostrador de Tax adviser del [employer] Para saber su hora de llegada, llame al [phone] entre la 1:00 p. m. y las 3:00 p. m. en: Viernes 09/02/23 Si su hora de llegada es a las 6:00 am, no llegue antes de esa hora ya que las puertas de [place] [facility] no se abren Teacher, adult education las 6:00 am.  RECORDAR: Las instrucciones que no se siguen completamente pueden provocar riesgos mdicos graves, que pueden llegar hasta la muerte; o, segn el criterio de su cirujano y Scientific laboratory technician, es posible que sea Aeronautical engineer su Leisure centre manager.  No ingiera alimentos ni bebidas  despus de la medianoche del da anterior a la ciruga. No mascar chicle ni caramelos duros.   Una semana antes de la ciruga: Detenga los antiinflamatorios (AINE) como Advil , Aleve , Ibuprofeno, Motrin , Naproxen , Naprosyn  y productos a base de aspirina como Excedrin, Goody's Powder, BC Powder. Suspenda CUALQUIER suplemento de venta libre hasta despus de la ciruga. Sin embargo, puede Educational psychologist tomando Tylenol  si es necesario para Press photographer da de la [place]. Contine tomando todos los medicamentos recetados, excepto los siguientes:   Siga las recomendaciones del cardilogo o PCP con respecto a suspender los anticoagulantes.  TOME SLO ESTOS MEDICAMENTOS LA MAANA DE LA CIRUGA CON UN SORBO DE AGUA:  omeprazole  (PRILOSEC) 40 MG  levothyroxine  (SYNTHROID ) 100 MCG  atenolol  (TENORMIN ) 50 MG  amLODipine  (NORVASC ) 5 MG    Anticido (tome uno la noche anterior y [employer] maana de la ciruga; [place] a prevenir las nuseas despus de la [place]).  No consumir alcohol durante 24 horas antes o despus de la [place].  No fumar, incluidos los cigarrillos electrnicos, durante las 24 horas previas a la [place]. No consumir productos de tabaco masticables durante al menos 6 horas antes de la [place]. Sin parches de Optometrist de la  [place].  No use ningn medicamento recreativo durante al menos una semana (preferiblemente 2 semanas) antes de la ciruga. Tenga en cuenta que la combinacin de cocana y anestesia puede tener resultados negativos, que pueden llegar hasta la [place]. Si su prueba de cocana da positivo, su ciruga ser cancelada.  La maana de la ciruga cepille sus dientes con pasta dental y agua, puede enjuagarse la boca con enjuague bucal si lo desea. No ingiera pasta de dientes ni enjuague bucal.  Utilice jabn o toallitas CHG como se indica en la hoja de instrucciones.  No use joyas, maquillaje, horquillas, clips ni esmalte de uas.  No use lociones, polvos ni perfumes.  No se afeite el vello corporal desde el cuello hacia abajo 48 horas antes de la [place].  No se pueden usar lentes de contacto, audfonos ni dentaduras postizas durante la [place].  No lleve objetos de valor al hospital. [facility] no es responsable de ninguna pertenencia u objeto de valor perdido o perdido.  Notifique a su mdico si hay algn cambio en su condicin mdica (resfriado, fiebre, infeccin).  Lleve ropa cmoda (especfica para su tipo de [place]) al hospital.  Despus de la ciruga, usted puede ayudar a prevenir complicaciones pulmonares haciendo ejercicios de respiracin. Respire profundamente y tosa cada 1 o 2 horas. Su mdico puede indicarle un dispositivo llamado espirmetro incentivador para ayudarle a respirar profundamente. Al toser o Engineering geologist, sostenga firmemente una almohada contra la incisin con ambas manos. Esto se llama ferulizacin. Hacer esto ayuda a proteger su incisin. Tambin disminuye las molestias abdominales.  Si  vas a pasar la noche en el hospital, deja tu maleta en el coche. Despus de la [place], es posible que lo lleven a su habitacin.  En caso de un mayor censo de [place], puede ser necesario que usted, el [place], contine con su atencin posoperatoria en el departamento de  Ciruga el Mismo Da.  Si le dan el alta el da de la [place], no se le permitir conducir a casa. Necesitar que una persona responsable lo lleve a su casa y se quede con usted durante las 24 horas posteriores a la [place].  Si viaja en transporte pblico, deber ir acompaado de una persona responsable.  Llame al Departamento de pruebas previas a la admisin al [phone] si tiene alguna pregunta sobre estas instrucciones.  Poltica de visitas a ciruga:  [employer] se someten a [place] o procedimiento pueden [employer] familiares o personas de apoyo con ellos, siempre y cuando la persona no sea positiva para COVID-19 ni experimente sus sntomas.  Visitas para pacientes hospitalizados:  El horario de visita es de 7 a 20 horas. Se permiten hasta cuatro visitantes a la vez en la habitacin de un paciente. Los visitantes podrn rotar con Garment/textile technologist. Ignacia persona de apoyo designada (adulto) podr pasar la noche.  Debido a un aumento en las tasas de VSR e influenza y las hospitalizaciones asociadas, los nios menores de 12 aos no podrn visitar a los pacientes en los hospitales de [employer]. Se siguen recomendando encarecidamente las mascarillas.  Preparacin para la ciruga con jabn de GLUCONATO DE CLORHEXIDINA (CHG)  Jabn de gluconato de clorhexidina (CHG)  o Un limpiador antisptico que [employer] grmenes y se adhiere a la piel para seguir [employer] grmenes incluso despus del lavado.  o Se utiliza para ducharse la noche anterior a la [place] y la maana de la [place].  Antes de la [place], usted puede desempear un papel importante al reducir la cantidad de grmenes en su piel. El jabn CHG (gluconato de clorhexidina) es un limpiador antisptico que mata los grmenes y se adhiere a la piel para continuar matndolos incluso despus del lavado.  No lo utilice si es alrgico al CHG o a los jabones antibacterianos. Si su piel se enrojece o irrita,  deje de usar CHG.  1. Ducharse la NOCHE ANTES DE LA CIRUGA y la [place] DE LA CIRUGA con jabn CHG.  2. Si eliges lavarte el cabello, lvalo primero como de costumbre con tu champ habitual.  3. Despus del champ, enjuague bien el cabello y el cuerpo para eliminar el champ.  4. Utilice CHG como lo hara con cualquier otro jabn lquido. Puede aplicar CHG directamente sobre la piel y lavar suavemente con un pauelo o una toallita limpia.  5. Aplique el jabn CHG en su cuerpo nicamente desde el cuello hacia abajo. No utilizar en heridas abiertas o llagas abiertas. Evite el contacto con los ojos, odos, boca y genitales (partes privadas). Lvese la cara y los genitales (partes privadas) con su jabn habitual.  6. Lvese bien, prestando especial atencin al rea donde se realizar su ciruga.  7. Enjuague bien su cuerpo con agua tibia.  8. No se duche ni se lave con su jabn normal despus de usar y enjuagar el jabn CHG.  9. Squese dando palmaditas con una toalla limpia.  10. Use pijamas limpios para dormir la noche anterior a la ciruga.  12. Coloque sbanas limpias en su cama la noche de su primera ducha  y no duerma con Neurosurgeon.  13. Ducharse nuevamente con el jabn CHG el da de la ciruga antes de llegar al hospital.  14. No aplique desodorantes, lociones o polvos.  15. Por favor use ropa limpia al hospital.

## 2023-09-01 ENCOUNTER — Encounter
Admission: RE | Admit: 2023-09-01 | Discharge: 2023-09-01 | Disposition: A | Discharge status: Home / Self Care | Source: Ambulatory Visit | Attending: Obstetrics and Gynecology | Admitting: Obstetrics and Gynecology

## 2023-09-01 DIAGNOSIS — Z01812 Encounter for preprocedural laboratory examination: Secondary | ICD-10-CM | POA: Diagnosis not present

## 2023-09-01 DIAGNOSIS — I10 Essential (primary) hypertension: Secondary | ICD-10-CM | POA: Diagnosis not present

## 2023-09-01 DIAGNOSIS — Z01818 Encounter for other preprocedural examination: Secondary | ICD-10-CM

## 2023-09-01 LAB — TYPE AND SCREEN
ABO/RH(D): O POS
Antibody Screen: NEGATIVE

## 2023-09-01 LAB — CBC
HCT: 43.2 % (ref 36.0–46.0)
Hemoglobin: 13.8 g/dL (ref 12.0–15.0)
MCH: 29 pg (ref 26.0–34.0)
MCHC: 31.9 g/dL (ref 30.0–36.0)
MCV: 90.8 fL (ref 80.0–100.0)
Platelets: 214 K/uL (ref 150–400)
RBC: 4.76 MIL/uL (ref 3.87–5.11)
RDW: 14.2 % (ref 11.5–15.5)
WBC: 5.8 K/uL (ref 4.0–10.5)
nRBC: 0 % (ref 0.0–0.2)

## 2023-09-04 ENCOUNTER — Encounter: Payer: Self-pay | Admitting: Pediatrics

## 2023-09-04 MED ORDER — GABAPENTIN 300 MG PO CAPS
300.0000 mg | ORAL_CAPSULE | ORAL | Status: AC
  Administered 2023-09-05: 300 mg via ORAL

## 2023-09-04 MED ORDER — ORAL CARE MOUTH RINSE: 15.0000 mL | Freq: Once | OROMUCOSAL | Status: AC

## 2023-09-04 MED ORDER — POVIDONE-IODINE 10 % EX SWAB: 2.0000 | Freq: Once | CUTANEOUS | Status: DC

## 2023-09-04 MED ORDER — SODIUM CHLORIDE 0.9 % IV SOLN: INTRAVENOUS | Status: DC

## 2023-09-04 MED ORDER — CHLORHEXIDINE GLUCONATE 0.12 % MT SOLN
15.0000 mL | Freq: Once | OROMUCOSAL | Status: AC
Start: 2023-09-04 — End: 2023-09-05
  Administered 2023-09-05: 15 mL via OROMUCOSAL

## 2023-09-04 MED ORDER — ACETAMINOPHEN 500 MG PO TABS
1000.0000 mg | ORAL_TABLET | ORAL | Status: AC
  Administered 2023-09-05: 1000 mg via ORAL

## 2023-09-05 ENCOUNTER — Other Ambulatory Visit: Payer: Self-pay

## 2023-09-05 ENCOUNTER — Encounter
Admission: RE | Disposition: A | Discharge status: Home / Self Care | Payer: Self-pay | Source: Ambulatory Visit | Attending: Obstetrics and Gynecology

## 2023-09-05 ENCOUNTER — Ambulatory Visit

## 2023-09-05 ENCOUNTER — Encounter: Payer: Self-pay | Admitting: Obstetrics and Gynecology

## 2023-09-05 ENCOUNTER — Ambulatory Visit
Admission: RE | Admit: 2023-09-05 | Discharge: 2023-09-05 | Disposition: A | Discharge status: Home / Self Care | Source: Ambulatory Visit | Attending: Obstetrics and Gynecology | Admitting: Obstetrics and Gynecology

## 2023-09-05 ENCOUNTER — Ambulatory Visit: Payer: Self-pay | Admitting: Urgent Care

## 2023-09-05 DIAGNOSIS — N80101 Endometriosis of right ovary, unspecified depth: Secondary | ICD-10-CM | POA: Diagnosis not present

## 2023-09-05 DIAGNOSIS — E119 Type 2 diabetes mellitus without complications: Secondary | ICD-10-CM

## 2023-09-05 DIAGNOSIS — N80201 Endometriosis of right fallopian tube, unspecified depth: Secondary | ICD-10-CM | POA: Diagnosis not present

## 2023-09-05 DIAGNOSIS — N838 Other noninflammatory disorders of ovary, fallopian tube and broad ligament: Secondary | ICD-10-CM | POA: Diagnosis not present

## 2023-09-05 DIAGNOSIS — N83202 Unspecified ovarian cyst, left side: Secondary | ICD-10-CM | POA: Insufficient documentation

## 2023-09-05 DIAGNOSIS — K219 Gastro-esophageal reflux disease without esophagitis: Secondary | ICD-10-CM | POA: Insufficient documentation

## 2023-09-05 DIAGNOSIS — E1142 Type 2 diabetes mellitus with diabetic polyneuropathy: Secondary | ICD-10-CM | POA: Insufficient documentation

## 2023-09-05 DIAGNOSIS — G4730 Sleep apnea, unspecified: Secondary | ICD-10-CM | POA: Insufficient documentation

## 2023-09-05 DIAGNOSIS — I10 Essential (primary) hypertension: Secondary | ICD-10-CM | POA: Diagnosis not present

## 2023-09-05 DIAGNOSIS — N83201 Unspecified ovarian cyst, right side: Secondary | ICD-10-CM

## 2023-09-05 HISTORY — PX: LAPAROSCOPIC BILATERAL SALPINGO OOPHERECTOMY: SHX5890

## 2023-09-05 LAB — GLUCOSE, CAPILLARY
Glucose-Capillary: 145 mg/dL — ABNORMAL HIGH (ref 70–99)
Glucose-Capillary: 172 mg/dL — ABNORMAL HIGH (ref 70–99)

## 2023-09-05 LAB — ABO/RH: ABO/RH(D): O POS

## 2023-09-05 SURGERY — SALPINGO-OOPHORECTOMY, BILATERAL, LAPAROSCOPIC
Anesthesia: General | Site: Abdomen | Laterality: Bilateral

## 2023-09-05 MED ORDER — PHENYLEPHRINE 80 MCG/ML (10ML) SYRINGE FOR IV PUSH (FOR BLOOD PRESSURE SUPPORT)
PREFILLED_SYRINGE | INTRAVENOUS | Status: DC | PRN
  Administered 2023-09-05: 160 ug via INTRAVENOUS
  Administered 2023-09-05 (×2): 80 ug via INTRAVENOUS
  Administered 2023-09-05: 160 ug via INTRAVENOUS

## 2023-09-05 MED ORDER — PROPOFOL 10 MG/ML IV BOLUS
INTRAVENOUS | Status: DC | PRN
  Administered 2023-09-05: 150 mg via INTRAVENOUS

## 2023-09-05 MED ORDER — ONDANSETRON HCL 4 MG/2ML IJ SOLN
INTRAMUSCULAR | Status: AC
  Filled 2023-09-05: qty 2

## 2023-09-05 MED ORDER — SUGAMMADEX SODIUM 200 MG/2ML IV SOLN
INTRAVENOUS | Status: DC | PRN
  Administered 2023-09-05: 200 mg via INTRAVENOUS

## 2023-09-05 MED ORDER — OXYCODONE HCL 5 MG PO TABS: 5.0000 mg | ORAL_TABLET | Freq: Once | ORAL | Status: DC | PRN

## 2023-09-05 MED ORDER — KETOROLAC TROMETHAMINE 30 MG/ML IJ SOLN
INTRAMUSCULAR | Status: AC
  Filled 2023-09-05: qty 1

## 2023-09-05 MED ORDER — KETOROLAC TROMETHAMINE 30 MG/ML IJ SOLN
INTRAMUSCULAR | Status: DC | PRN
  Administered 2023-09-05: 30 mg via INTRAVENOUS

## 2023-09-05 MED ORDER — DEXAMETHASONE SODIUM PHOSPHATE 10 MG/ML IJ SOLN
INTRAMUSCULAR | Status: DC | PRN
  Administered 2023-09-05: 10 mg via INTRAVENOUS

## 2023-09-05 MED ORDER — FENTANYL CITRATE (PF) 100 MCG/2ML IJ SOLN
INTRAMUSCULAR | Status: DC | PRN
  Administered 2023-09-05: 50 ug via INTRAVENOUS

## 2023-09-05 MED ORDER — 0.9 % SODIUM CHLORIDE (POUR BTL) OPTIME
TOPICAL | Status: DC | PRN
  Administered 2023-09-05: 500 mL

## 2023-09-05 MED ORDER — DEXMEDETOMIDINE HCL IN NACL 80 MCG/20ML IV SOLN
INTRAVENOUS | Status: DC | PRN
  Administered 2023-09-05 (×2): 8 ug via INTRAVENOUS

## 2023-09-05 MED ORDER — HYDROCODONE-ACETAMINOPHEN 5-325 MG PO TABS: 1.0000 | ORAL_TABLET | Freq: Four times a day (QID) | ORAL | 0 refills | Status: DC | PRN

## 2023-09-05 MED ORDER — FENTANYL CITRATE (PF) 100 MCG/2ML IJ SOLN: 25.0000 ug | INTRAMUSCULAR | Status: DC | PRN

## 2023-09-05 MED ORDER — ROCURONIUM BROMIDE 100 MG/10ML IV SOLN
INTRAVENOUS | Status: DC | PRN
  Administered 2023-09-05: 50 mg via INTRAVENOUS
  Administered 2023-09-05 (×2): 10 mg via INTRAVENOUS

## 2023-09-05 MED ORDER — BUPIVACAINE HCL 0.5 % IJ SOLN
INTRAMUSCULAR | Status: DC | PRN
  Administered 2023-09-05: 17 mL

## 2023-09-05 MED ORDER — DEXAMETHASONE SODIUM PHOSPHATE 10 MG/ML IJ SOLN
INTRAMUSCULAR | Status: AC
  Filled 2023-09-05: qty 1

## 2023-09-05 MED ORDER — EPHEDRINE SULFATE-NACL 50-0.9 MG/10ML-% IV SOSY
PREFILLED_SYRINGE | INTRAVENOUS | Status: DC | PRN
  Administered 2023-09-05: 7.5 mg via INTRAVENOUS
  Administered 2023-09-05: 5 mg via INTRAVENOUS

## 2023-09-05 MED ORDER — SEVOFLURANE IN SOLN
RESPIRATORY_TRACT | Status: AC
  Filled 2023-09-05: qty 250

## 2023-09-05 MED ORDER — ROCURONIUM BROMIDE 10 MG/ML (PF) SYRINGE
PREFILLED_SYRINGE | INTRAVENOUS | Status: AC
  Filled 2023-09-05: qty 10

## 2023-09-05 MED ORDER — ACETAMINOPHEN 10 MG/ML IV SOLN
1000.0000 mg | Freq: Once | INTRAVENOUS | Status: DC | PRN
Start: 2023-09-05 — End: 2023-09-05

## 2023-09-05 MED ORDER — LIDOCAINE HCL (PF) 2 % IJ SOLN
INTRAMUSCULAR | Status: AC
  Filled 2023-09-05: qty 5

## 2023-09-05 MED ORDER — LIDOCAINE HCL (CARDIAC) PF 100 MG/5ML IV SOSY
PREFILLED_SYRINGE | INTRAVENOUS | Status: DC | PRN
  Administered 2023-09-05: 80 mg via INTRAVENOUS

## 2023-09-05 MED ORDER — BUPIVACAINE HCL (PF) 0.5 % IJ SOLN
INTRAMUSCULAR | Status: AC
  Filled 2023-09-05: qty 30

## 2023-09-05 MED ORDER — FENTANYL CITRATE (PF) 100 MCG/2ML IJ SOLN
INTRAMUSCULAR | Status: AC
  Filled 2023-09-05: qty 2

## 2023-09-05 MED ORDER — ONDANSETRON HCL 4 MG/2ML IJ SOLN
INTRAMUSCULAR | Status: DC | PRN
  Administered 2023-09-05: 4 mg via INTRAVENOUS

## 2023-09-05 MED ORDER — ACETAMINOPHEN 500 MG PO TABS
ORAL_TABLET | ORAL | Status: AC
  Filled 2023-09-05: qty 2

## 2023-09-05 MED ORDER — GABAPENTIN 300 MG PO CAPS
ORAL_CAPSULE | ORAL | Status: AC
Start: 2023-09-05 — End: 2023-09-05
  Filled 2023-09-05: qty 1

## 2023-09-05 MED ORDER — LACTATED RINGERS IV SOLN: INTRAVENOUS | Status: DC

## 2023-09-05 MED ORDER — PROPOFOL 10 MG/ML IV BOLUS
INTRAVENOUS | Status: AC
  Filled 2023-09-05: qty 40

## 2023-09-05 MED ORDER — DROPERIDOL 2.5 MG/ML IJ SOLN: 0.6250 mg | Freq: Once | INTRAMUSCULAR | Status: DC | PRN

## 2023-09-05 MED ORDER — OXYCODONE HCL 5 MG/5ML PO SOLN: 5.0000 mg | Freq: Once | ORAL | Status: DC | PRN

## 2023-09-05 MED ORDER — CHLORHEXIDINE GLUCONATE 0.12 % MT SOLN
OROMUCOSAL | Status: AC
  Filled 2023-09-05: qty 15

## 2023-09-05 SURGICAL SUPPLY — 38 items
ADHESIVE MASTISOL STRL (MISCELLANEOUS) ×1 IMPLANT
ANCHOR TIS RET SYS 235ML (MISCELLANEOUS) IMPLANT
BAG LAPAROSCOPIC 12 15 PORT 16 (BASKET) IMPLANT
BAG URINE DRAIN 2000ML AR STRL (UROLOGICAL SUPPLIES) IMPLANT
BLADE SURG 10 STRL SS SAFETY (BLADE) IMPLANT
BLADE SURG SZ11 CARB STEEL (BLADE) ×1 IMPLANT
BNDG COHESIVE 4X5 TAN STRL LF (GAUZE/BANDAGES/DRESSINGS) IMPLANT
CATH URTH 16FR FL 2W BLN LF (CATHETERS) ×1 IMPLANT
CHLORAPREP W/TINT 26 (MISCELLANEOUS) ×1 IMPLANT
DERMABOND ADVANCED .7 DNX12 (GAUZE/BANDAGES/DRESSINGS) ×1 IMPLANT
ELECTRODE REM PT RTRN 9FT ADLT (ELECTROSURGICAL) ×1 IMPLANT
GLOVE PI ORTHO PRO STRL 7.5 (GLOVE) ×2 IMPLANT
GOWN STRL REUS W/ TWL LRG LVL3 (GOWN DISPOSABLE) ×1 IMPLANT
GOWN STRL REUS W/ TWL XL LVL3 (GOWN DISPOSABLE) ×2 IMPLANT
GRASPER SUT TROCAR 14GX15 (MISCELLANEOUS) IMPLANT
HANDLE YANKAUER SUCT BULB TIP (MISCELLANEOUS) IMPLANT
IRRIGATION STRYKERFLOW (MISCELLANEOUS) IMPLANT
IV LACTATED RINGERS 1000ML (IV SOLUTION) ×1 IMPLANT
KIT PINK PAD W/HEAD ARM REST (MISCELLANEOUS) ×1 IMPLANT
KIT TURNOVER CYSTO (KITS) ×1 IMPLANT
LIGASURE LAP MARYLAND 5MM 37CM (ELECTROSURGICAL) IMPLANT
MANIFOLD NEPTUNE II (INSTRUMENTS) ×1 IMPLANT
PACK GYN LAPAROSCOPIC (MISCELLANEOUS) ×1 IMPLANT
PAD OB MATERNITY 11 LF (PERSONAL CARE ITEMS) ×1 IMPLANT
PAD PREP OB/GYN DISP 24X41 (PERSONAL CARE ITEMS) ×1 IMPLANT
PENCIL SMOKE EVACUATOR (MISCELLANEOUS) ×1 IMPLANT
SCRUB CHG 4% DYNA-HEX 4OZ (MISCELLANEOUS) ×1 IMPLANT
SLEEVE Z-THREAD 5X100MM (TROCAR) ×2 IMPLANT
STRIP CLOSURE SKIN 1/2X4 (GAUZE/BANDAGES/DRESSINGS) ×1 IMPLANT
SUT VIC AB 4-0 PS2 27 (SUTURE) ×2 IMPLANT
SUT VICRYL 0 UR6 27IN ABS (SUTURE) ×1 IMPLANT
SYR 10ML LL (SYRINGE) ×1 IMPLANT
TAPE TRANSPORE STRL 2 31045 (GAUZE/BANDAGES/DRESSINGS) IMPLANT
TRAP FLUID SMOKE EVACUATOR (MISCELLANEOUS) ×1 IMPLANT
TROCAR Z-THREAD FIOS 12X100MM (TROCAR) IMPLANT
TROCAR Z-THREAD FIOS 5X100MM (TROCAR) ×1 IMPLANT
TUBING EVAC SMOKE HEATED PNEUM (TUBING) ×1 IMPLANT
WATER STERILE IRR 500ML POUR (IV SOLUTION) ×1 IMPLANT

## 2023-09-05 NOTE — Interval H&P Note (Signed)
 History and Physical Interval Note:  09/05/2023 7:39 AM  [name]  has presented today for surgery, with the diagnosis of Right adnexal solid tumor.  The various methods of treatment have been discussed with the patient and family. After consideration of risks, benefits and other options for treatment, the patient has consented to  Procedure(s): SALPINGO-OOPHORECTOMY, BILATERAL, LAPAROSCOPIC (Bilateral) as a surgical intervention.  The patient's history has been reviewed, patient examined, no change in status, stable for surgery.  I have reviewed the patient's chart and labs.  Questions were answered to the patient's satisfaction.     Alm Sar

## 2023-09-05 NOTE — Anesthesia Postprocedure Evaluation (Signed)
 Anesthesia Post Note  Patient: [name]  Procedure(s) Performed: SALPINGO-OOPHORECTOMY, BILATERAL, LAPAROSCOPIC (Bilateral: Abdomen)  Patient location during evaluation: PACU Anesthesia Type: General Level of consciousness: awake and alert Pain management: pain level controlled Vital Signs Assessment: post-procedure vital signs reviewed and stable Respiratory status: spontaneous breathing, nonlabored ventilation and respiratory function stable Cardiovascular status: blood pressure returned to baseline and stable Postop Assessment: no apparent nausea or vomiting Anesthetic complications: no   There were no known notable events for this encounter.   Last Vitals:  Vitals:   09/05/23 1015 09/05/23 1036  BP: 110/69 123/63  Pulse: 60 65  Resp: 16 17  Temp: 36.7 C 36.7 C  SpO2: 97% 98%    Last Pain:  Vitals:   09/05/23 1036  TempSrc: Oral  PainSc: 0-No pain                 Camellia Merilee Louder
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
## 2023-09-05 NOTE — Op Note (Signed)
     OPERATIVE NOTE 09/05/2023 9:09 AM  PRE-OPERATIVE DIAGNOSIS:  1) Right adnexal solid tumor  POST-OPERATIVE DIAGNOSIS:  Post-Op Diagnosis Codes:    * Ovarian mass, right [N83.8]  OPERATION:  Laparoscopic BSO  SURGEON(S): Surgeons and Role:    DEWAINE Janit Alm Lynwood, MD - Primary    * Leigh Sober, MD - Assisting   ANESTHESIA: General  ESTIMATED BLOOD LOSS: 15 mL  OPERATIVE FINDINGS: Right ovarian mass  SPECIMEN:  ID Type Source Tests Collected by Time Destination  1 : left fallopian tube and ovary GYN Fallopian Tubes and Ovaries, Bilateral SURGICAL PATHOLOGY Janit Alm Lynwood, MD 09/05/2023 [phone]   2 : right fallopian tube and ovary GYN Fallopian Tubes and Ovaries, Bilateral SURGICAL PATHOLOGY Janit Alm Lynwood, MD 09/05/2023 [phone]     COMPLICATIONS: None  DISPOSITION: Stable to recovery room  DESCRIPTION OF PROCEDURE:      The patient was prepped and draped in the dorsolithotomy position and placed under general anesthesia. The bladder was emptied. The cervix was grasped with a multi-toothed tenaculum and a uterine manipulator was placed within the cervical os respecting the position and curvature of the uterus. After changing gloves we proceeded abdominally. A small infraumbilical incision was made and a 5 mm trocar port was placed within the abdominopelvic cavity. The opening pressure was less than 7 mmHg.  Approximately 3 and 1/2 L of carbon dioxide gas was instilled within the abdominal pelvic cavity. The laparoscope was placed and the pelvis and abdomen were carefully inspected.  Under direct visualization right and left lower quadrant ports were placed. The infundibulopelvic ligaments were carefully identified. The ureters were identified out of the operative field. The ovaries and tubes were retracted medially.  The infundibulopelvic ligaments were triply coagulated and divided. The mesenteric side of the tube was coagulated and divided.  The utero-ovarian ligament and tube  were doubly coagulated and divided.  Both specimens were placed in an endocatch bag. Hemostasis of the pedicles was noted.  The bag was removed through the L lower quadrant port. Using the acorn, the fascia was closed in the L lower quadrant port.  Hemostasis of all areas of the pelvis was noted. The remaining ports were removed under direct visualization.  Hemostasis was noted.  The laparoscope was removed the gas was allowed to escape and the trocar sleeve was removed. The incisions were closed by subcuticular closure of the skin for all port sites. A long-acting anesthetic was injected.  Dermabond was applied. The uterine manipulator was removed. Hemostasis of the cervix was noted. The patient went to the recovery room in stable condition.  Dr. Leigh provided exposure, dissection, suctioning, retraction, and general support and assistance during the procedure.   Alm DOROTHA Janit, M.D. 09/05/2023 9:09 AM

## 2023-09-05 NOTE — Transfer of Care (Signed)
 Immediate Anesthesia Transfer of Care Note  Patient: [name]  Procedure(s) Performed: SALPINGO-OOPHORECTOMY, BILATERAL, LAPAROSCOPIC (Bilateral: Abdomen)  Patient Location: PACU  Anesthesia Type:General  Level of Consciousness: drowsy  Airway & Oxygen Therapy: Patient Spontanous Breathing and Patient connected to face mask oxygen  Post-op Assessment: Report given to RN and Post -op Vital signs reviewed and stable  Post vital signs: Reviewed and stable  Last Vitals:  Vitals Value Taken Time  BP 117/65 09/05/23 09:08  Temp 36.8 C 09/05/23 09:08  Pulse 66 09/05/23 09:10  Resp 23 09/05/23 09:10  SpO2 100 % 09/05/23 09:10  Vitals shown include unfiled device data.  Last Pain:  Vitals:   09/05/23 0628  PainSc: 3          Complications: There were no known notable events for this encounter.

## 2023-09-05 NOTE — Anesthesia Procedure Notes (Signed)
 Procedure Name: Intubation Date/Time: 09/05/2023 7:43 AM  Performed by: Kearney Rosina SAILOR, RNPre-anesthesia Checklist: Patient identified, Patient being monitored, Timeout performed, Emergency Drugs available and Suction available Patient Re-evaluated:Patient Re-evaluated prior to induction Oxygen Delivery Method: Circle system utilized Preoxygenation: Pre-oxygenation with 100% oxygen Induction Type: IV induction Ventilation: Mask ventilation without difficulty and Oral airway inserted - appropriate to patient size Laryngoscope Size: McGrath and 3 Grade View: Grade I Tube type: Oral Tube size: 6.5 mm Number of attempts: 1 Airway Equipment and Method: Stylet Placement Confirmation: ETT inserted through vocal cords under direct vision, positive ETCO2 and breath sounds checked- equal and bilateral Secured at: 22 cm Tube secured with: Tape Dental Injury: Teeth and Oropharynx as per pre-operative assessment  Comments: atraumatic

## 2023-09-06 ENCOUNTER — Encounter: Payer: Self-pay | Admitting: Obstetrics and Gynecology

## 2023-09-08 LAB — SURGICAL PATHOLOGY

## 2023-09-09 ENCOUNTER — Ambulatory Visit: Payer: Self-pay

## 2023-09-09 NOTE — Telephone Encounter (Signed)
  FYI Only or Action Required?: FYI only for provider.  Patient was last seen in primary care on 08/25/2023 by Jennifer Hadassah SQUIBB, MD.  Called Nurse Triage reporting bgl elevated.  Symptoms began yesterday.  Interventions attempted: Nothing.  Symptoms are: stable.  Triage Disposition: Home Care  Patient/caregiver understands and will follow disposition?: Yes            Copied from CRM #[id number]. Topic: Clinical - Red Word Triage >> Sep 09, 2023 10:29 AM Ivette P wrote: Red Word that prompted transfer to Nurse Triage: had a procedure done on abdomen, on ovaries recently  - blood sugar is not well controlled. very low yesterday- 92 , no energy  and very high today - over 222 Reason for Disposition  [1] Blood glucose 240 - 300 mg/dL (86.6 - 83.2 mmol/L) AND [2] uses insulin  (e.g., insulin -dependent, all people with type 1 diabetes)  Answer Assessment - Initial Assessment Questions 1. BLOOD GLUCOSE: What is your blood glucose level?      Today 222, yesterday 92, and right now 179 2. ONSET: When did you check the blood glucose?     yesterday 3. USUAL RANGE: What is your glucose level usually? (e.g., usual fasting morning value, usual evening value)     139-98 4. KETONES: Do you check for ketones (urine or blood test strips)? If Yes, ask: What does the test show now?      na 5. TYPE 1 or 2:  Do you know what type of diabetes you have?  (e.g., Type 1, Type 2, Gestational; doesn't know)      DM2 6. INSULIN : Do you take insulin ? What type of insulin (s) do you use? What is the mode of delivery? (syringe, pen; injection or pump)?      denies 7. DIABETES PILLS: Do you take any pills for your diabetes? If Yes, ask: Have you missed taking any pills recently?     denies 8. OTHER SYMPTOMS: Do you have any symptoms? (e.g., fever, frequent urination, difficulty breathing, dizziness, weakness, vomiting)     States ovaries were taken out this week, denies above  symptoms 9. PREGNANCY: Is there any chance you are pregnant? When was your last menstrual period?     na  Protocols used: Diabetes - High Blood Sugar-A-AH

## 2023-09-12 ENCOUNTER — Ambulatory Visit: Payer: Self-pay

## 2023-09-13 ENCOUNTER — Ambulatory Visit: Admitting: Obstetrics and Gynecology

## 2023-09-13 ENCOUNTER — Encounter: Payer: Self-pay | Admitting: Obstetrics and Gynecology

## 2023-09-13 VITALS — BP 133/78 | HR 80 | Ht 62.0 in | Wt 170.6 lb

## 2023-09-13 DIAGNOSIS — Z4889 Encounter for other specified surgical aftercare: Secondary | ICD-10-CM

## 2023-09-13 DIAGNOSIS — Z9889 Other specified postprocedural states: Secondary | ICD-10-CM

## 2023-09-13 NOTE — Progress Notes (Signed)
 Patient presents for 1 week postop follow-up following BSO. She states doing well, occasional pain but not taking any pain medication at this time.
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
## 2023-09-28 ENCOUNTER — Ambulatory Visit (INDEPENDENT_AMBULATORY_CARE_PROVIDER_SITE_OTHER): Admitting: Pediatrics

## 2023-09-28 ENCOUNTER — Encounter: Payer: Self-pay | Admitting: Pediatrics

## 2023-09-28 VITALS — BP 131/80 | HR 77 | Temp 97.4°F | Wt 170.0 lb

## 2023-09-28 DIAGNOSIS — I10 Essential (primary) hypertension: Secondary | ICD-10-CM | POA: Diagnosis not present

## 2023-09-28 DIAGNOSIS — N838 Other noninflammatory disorders of ovary, fallopian tube and broad ligament: Secondary | ICD-10-CM

## 2023-09-28 DIAGNOSIS — H04123 Dry eye syndrome of bilateral lacrimal glands: Secondary | ICD-10-CM

## 2023-09-28 DIAGNOSIS — E119 Type 2 diabetes mellitus without complications: Secondary | ICD-10-CM

## 2023-09-28 DIAGNOSIS — E1142 Type 2 diabetes mellitus with diabetic polyneuropathy: Secondary | ICD-10-CM | POA: Diagnosis not present

## 2023-09-28 NOTE — Patient Instructions (Signed)
150/90  90/60 

## 2023-09-28 NOTE — Progress Notes (Unsigned)
   09/28/2023    2:04 PM 08/10/2023    1:19 PM 08/05/2023    1:30 PM 04/13/2023   10:43 AM 06/08/2022    2:24 PM  Depression screen PHQ 2/9  Decreased Interest 0 0 0 0 0  Down, Depressed, Hopeless 0 0 0 0 0  PHQ - 2 Score 0 0 0 0 0  Altered sleeping 0 0 0 1 0  Tired, decreased energy 1 0 0 1 0  Change in appetite 1 0 0 0 0  Feeling bad or failure about yourself  0 0 0 0 0  Trouble concentrating 0 0 0 0 0  Moving slowly or fidgety/restless 0 0 0 1 0  Suicidal thoughts 0 0 0 0 0  PHQ-9 Score 2 0 0 3 0  Difficult doing work/chores Not difficult at all Not difficult at all  Not difficult at all Not  difficult at all       09/28/2023    2:05 PM 08/10/2023    1:20 PM 08/05/2023    1:31 PM 04/13/2023   10:44 AM  GAD 7 : Generalized Anxiety Score  Nervous, Anxious, on Edge 0 0 0 0  Control/stop worrying 0 0 0 0  Worry too much - different things 0 0 0 0  Trouble relaxing 0 0 0 0  Restless 0 0 0 0  Easily annoyed or irritable 0 0 0 0  Afraid - awful might happen 0 0 0 0  Total GAD 7 Score 0 0 0 0  Anxiety Difficulty Not difficult at all Not difficult at all Not difficult at all Not difficult at all       Assessment & Plan:  Assessment & Plan   Essential hypertension Assessment & Plan: Blood pressure management ongoing with amlodipine . Episodes of dizziness and fatigue noted, possibly due to blood pressure fluctuations or post op recovery. Recently discontinue atenolol  as well. Goal: maintain BP <140/90 mmHg. - Monitor BP at home. Report if >150/90 mmHg or <90/60 mmHg for 2 days. - Adjust amlodipine  to 2.5 mg if BP consistently high but 5mg  too much - Consider discontinuing amlodipine  if fatigue persists. - Take amlodipine  at night to reduce daytime fatigue.  Diet-controlled type 2 diabetes mellitus (HCC) Type 2 diabetes, controlled, with peripheral neuropathy (HCC) Assessment & Plan: Blood sugar reading of 200 mg/dL noted. Issue with lancet supply needs resolution. - Ensure lancets available for glucose monitoring. - Continue home glucose monitoring. - Contact pharmacy to resolve lancet supply issue.   Ovarian mass, right Assessment & Plan: Recovery progressing well. Initial pain managed with hydrocodone , discontinued due to adverse effects. Fatigue may relate to surgery and medication changes. - Attend follow-up with surgeon next month.  Dry eye syndrome Dry eye symptoms with intermittent discharge. No infection signs. Uses artificial tears. - Continue artificial tears for symptom relief. - Attend ophthalmology appointment on August 28.    Follow up plan: Return in  about 2 weeks (around 10/12/2023) for HTN.  Jennifer SHAUNNA Nett, MD

## 2023-10-04 ENCOUNTER — Encounter: Payer: Self-pay | Admitting: Pediatrics

## 2023-10-04 NOTE — Assessment & Plan Note (Signed)
 Recovery progressing well. Initial pain managed with hydrocodone , discontinued due to adverse effects. Fatigue may relate to surgery and medication changes. - Attend follow-up with surgeon next month.

## 2023-10-04 NOTE — Assessment & Plan Note (Signed)
 Blood sugar reading of 200 mg/dL noted. Issue with lancet supply needs resolution. - Ensure lancets available for glucose monitoring. - Continue home glucose monitoring. - Contact pharmacy to resolve lancet supply issue.

## 2023-10-04 NOTE — Assessment & Plan Note (Signed)
 Blood pressure management ongoing with amlodipine . Episodes of dizziness and fatigue noted, possibly due to blood pressure fluctuations. Goal: maintain BP <140/90 mmHg. - Monitor BP at home. Report if >150/90 mmHg or <90/60 mmHg for 2 days. - Adjust amlodipine  to 2.5 mg if BP consistently high. - Consider discontinuing amlodipine  if fatigue persists. - Take amlodipine  at night to reduce daytime fatigue.

## 2023-10-12 ENCOUNTER — Ambulatory Visit (INDEPENDENT_AMBULATORY_CARE_PROVIDER_SITE_OTHER): Admitting: Pediatrics

## 2023-10-12 VITALS — BP 127/75 | HR 79 | Temp 97.8°F | Wt 169.2 lb

## 2023-10-12 DIAGNOSIS — E119 Type 2 diabetes mellitus without complications: Secondary | ICD-10-CM | POA: Diagnosis not present

## 2023-10-12 DIAGNOSIS — E1142 Type 2 diabetes mellitus with diabetic polyneuropathy: Secondary | ICD-10-CM

## 2023-10-12 DIAGNOSIS — G4733 Obstructive sleep apnea (adult) (pediatric): Secondary | ICD-10-CM

## 2023-10-12 DIAGNOSIS — I10 Essential (primary) hypertension: Secondary | ICD-10-CM | POA: Diagnosis not present

## 2023-10-12 MED ORDER — AMLODIPINE BESYLATE 5 MG PO TABS: 5.0000 mg | ORAL_TABLET | Freq: Every day | ORAL | 3 refills | Status: AC

## 2023-10-12 NOTE — Progress Notes (Signed)
 Office Visit  BP 127/75   Pulse 79   Temp 97.8 F (36.6 C) (Oral)   Wt 169 lb 3.2 oz (76.7 kg)   SpO2 94%   BMI 30.95 kg/m    Subjective:    Patient ID: [name], female    DOB: 27-Mar-1934, 89 y.o.   MRN: [id number]  HPI: [name] is a 89 y.o. female  Chief Complaint  Patient presents with   Hypertension    Discussed the use of AI scribe software for clinical note transcription with the patient, who gave verbal consent to proceed.  History of Present Illness   SHARIN Benton is an 89 year old female who presents for follow-up of fatigue and leg symptoms.  She has been experiencing fatigue, described as feeling 'cansada' and a 'little bit of fatigue', with some improvement over the past three days. She attributes part of her fatigue to a recent surgery, which she describes as 'tremendo'.  She has been taking atenolol  at a dose of 2.5 mg, which she cuts in half.  She describes an episode of discomfort in her leg, feeling as if something was 'amarrando' her leg, accompanied by a sensation of heat. This occurred about three days ago and lasted for one day. One leg is usually more swollen than the other, which she attributes to her neuropathy.  She has a history of sleep apnea and is awaiting results from a sleep study. She uses a machine for her apnea but has not received recent results.  Regarding her blood sugar levels, glucose levels are around 151 mg/dL, sometimes before meals. She feels better when her sugar is slightly elevated, around 145 mg/dL, compared to when it drops to 110 mg/dL or lower, which makes her feel unwell.  She mentions an eye issue, with a history of redness in one eye, described as 'sangre' in the eye. She plans to see an eye doctor soon as the previous treatment did not resolve the issue.  She is planning to go on a cruise with her daughter in December.        Relevant past medical, surgical, family and social history reviewed and  updated as indicated. Interim medical history since our last visit reviewed. Allergies and medications reviewed and updated.  ROS per HPI unless specifically indicated above     Objective:    BP 127/75   Pulse 79   Temp 97.8 F (36.6 C) (Oral)   Wt 169 lb 3.2 oz (76.7 kg)   SpO2 94%   BMI 30.95 kg/m   Wt Readings from Last 3 Encounters:  10/12/23 169 lb 3.2 oz (76.7 kg)  09/28/23 170 lb (77.1 kg)  09/13/23 170 lb 9.6 oz (77.4 kg)     Physical Exam Constitutional:      Appearance: Normal appearance.  Pulmonary:     Effort: Pulmonary effort is normal.  Musculoskeletal:        General: Normal range of motion.  Neurological:     General: No focal deficit present.     Mental Status: She is alert. Mental status is at baseline.  Psychiatric:        Mood and Affect: Mood normal.        Behavior: Behavior normal.        Thought Content: Thought content normal.         10/12/2023    2:21 PM 09/28/2023    2:04 PM 08/10/2023    1:19 PM 08/05/2023  1:30 PM 04/13/2023   10:43 AM  Depression screen PHQ 2/9  Decreased Interest 0 0 0 0 0  Down, Depressed, Hopeless 0 0 0 0 0  PHQ - 2 Score 0 0 0 0 0  Altered sleeping 0 0 0 0 1  Tired, decreased energy 0 1 0 0 1  Change in appetite 0 1 0 0 0  Feeling bad or failure about yourself  0 0 0 0 0  Trouble concentrating 0 0 0 0 0  Moving slowly or fidgety/restless 0 0 0 0 1  Suicidal thoughts 0 0 0 0 0  PHQ-9 Score 0 2 0 0 3  Difficult doing work/chores Not difficult at all Not difficult at all Not difficult at all  Not difficult at all       10/12/2023    2:21 PM 09/28/2023    2:05 PM 08/10/2023    1:20 PM 08/05/2023    1:31 PM  GAD 7 : Generalized Anxiety Score  Nervous, Anxious, on Edge 0 0 0 0  Control/stop worrying 0 0 0 0  Worry too much - different things 0 0 0 0  Trouble relaxing 0 0 0 0  Restless 0 0 0 0  Easily annoyed or irritable 0 0 0 0  Afraid - awful might happen 0 0 0 0  Total GAD 7 Score 0 0 0 0  Anxiety  Difficulty Not difficult at all Not difficult at all Not difficult at all Not difficult at all       Assessment & Plan:  Assessment & Plan   Essential hypertension Assessment & Plan: Blood pressure well-controlled with current medication, amlodipine  5mg . - Monitor blood pressure weekly or if feeling unwell.  Orders: -     amLODIPine  Besylate; Take 1 tablet (5 mg total) by mouth daily.  Dispense: 90 tablet; Refill: 3  Type 2 diabetes, controlled, with peripheral neuropathy (HCC) Diet-controlled type 2 diabetes mellitus (HCC) Assessment & Plan: Blood glucose generally well-controlled. Intermittent symptoms of tightness and warmth in leg, possibly neuropathy or muscular. Hypoglycemia more concerning than hyperglycemia. Slightly higher glucose levels preferred. - Monitor blood glucose levels, especially if feeling unwell. - Contact provider if blood glucose levels exceed 180 mg/dL or fall below 80 mg/dL.   Moderate obstructive sleep apnea Assessment & Plan: Confirmed diagnosis. Awaiting sleep study results for management. - Send for auto titration for sleep apnea management.   Follow up plan: Return in about 3 months (around 01/12/2024) for Chronic illness f/u.  Hadassah SHAUNNA Nett, MD

## 2023-10-13 DIAGNOSIS — H401133 Primary open-angle glaucoma, bilateral, severe stage: Secondary | ICD-10-CM | POA: Diagnosis not present

## 2023-10-13 DIAGNOSIS — H0100B Unspecified blepharitis left eye, upper and lower eyelids: Secondary | ICD-10-CM | POA: Diagnosis not present

## 2023-10-13 DIAGNOSIS — H0100A Unspecified blepharitis right eye, upper and lower eyelids: Secondary | ICD-10-CM | POA: Diagnosis not present

## 2023-10-13 NOTE — Progress Notes (Addendum)
 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 Susa

## 2023-10-16 ENCOUNTER — Other Ambulatory Visit: Payer: Self-pay | Admitting: Family Medicine

## 2023-10-16 DIAGNOSIS — R1013 Epigastric pain: Secondary | ICD-10-CM

## 2023-10-19 ENCOUNTER — Encounter: Payer: Self-pay | Admitting: Pediatrics

## 2023-10-19 NOTE — Assessment & Plan Note (Signed)
 Blood pressure well-controlled with current medication, amlodipine  5mg . - Monitor blood pressure weekly or if feeling unwell.

## 2023-10-19 NOTE — Assessment & Plan Note (Signed)
 Confirmed diagnosis. Awaiting sleep study results for management. - Send for auto titration for sleep apnea management.

## 2023-10-19 NOTE — Assessment & Plan Note (Addendum)
 Blood glucose generally well-controlled. Intermittent symptoms of tightness and warmth in leg, possibly neuropathy or muscular. Hypoglycemia more concerning than hyperglycemia. Slightly higher glucose levels preferred. - Monitor blood glucose levels, especially if feeling unwell. - Contact provider if blood glucose levels exceed 180 mg/dL or fall below 80 mg/dL.

## 2023-10-31 ENCOUNTER — Other Ambulatory Visit: Payer: Self-pay | Admitting: Family Medicine

## 2023-10-31 NOTE — Telephone Encounter (Signed)
Forward to new PCP

## 2023-11-01 ENCOUNTER — Telehealth: Payer: Self-pay | Admitting: Pediatrics

## 2023-11-01 NOTE — Telephone Encounter (Signed)
 Prescription Request  11/01/2023  LOV: 10/12/2023  What is the name of the medication or equipment? Accu-Chek Softclix Lancets lancets   Have you contacted your pharmacy to request a refill? No   Which pharmacy would you like this sent to?     [id number] [place], [place] - [address] AT [facility] OF [address] [place] [address] Phone: [phone] Fax: [phone]   Patient notified that their request is being sent to the clinical staff for review and that they should receive a response within 2 business days.   Please advise at Mobile [phone] (mobile)

## 2023-11-02 ENCOUNTER — Other Ambulatory Visit: Payer: Self-pay | Admitting: Pediatrics

## 2023-11-02 NOTE — Telephone Encounter (Signed)
 Attempted to reach patient to inform of message below left message for a return call   Okay for E2C2 to inform patient if the call is returned

## 2023-11-07 NOTE — Telephone Encounter (Signed)
 Second attempt to contact patient will close encounter

## 2023-11-09 DIAGNOSIS — Z96612 Presence of left artificial shoulder joint: Secondary | ICD-10-CM | POA: Diagnosis not present

## 2023-11-09 DIAGNOSIS — M12812 Other specific arthropathies, not elsewhere classified, left shoulder: Secondary | ICD-10-CM | POA: Diagnosis not present

## 2023-12-13 ENCOUNTER — Ambulatory Visit

## 2023-12-13 DIAGNOSIS — Z23 Encounter for immunization: Secondary | ICD-10-CM | POA: Diagnosis not present

## 2023-12-14 ENCOUNTER — Ambulatory Visit: Admitting: Obstetrics & Gynecology

## 2023-12-15 ENCOUNTER — Ambulatory Visit: Admitting: Obstetrics and Gynecology

## 2024-01-03 ENCOUNTER — Other Ambulatory Visit: Payer: Self-pay | Admitting: Obstetrics & Gynecology

## 2024-01-03 ENCOUNTER — Telehealth: Payer: Self-pay

## 2024-01-03 DIAGNOSIS — N838 Other noninflammatory disorders of ovary, fallopian tube and broad ligament: Secondary | ICD-10-CM

## 2024-01-03 DIAGNOSIS — N950 Postmenopausal bleeding: Secondary | ICD-10-CM | POA: Insufficient documentation

## 2024-01-03 DIAGNOSIS — R9389 Abnormal findings on diagnostic imaging of other specified body structures: Secondary | ICD-10-CM

## 2024-01-03 NOTE — Telephone Encounter (Signed)
-----   Message from Harland JAYSON Birkenhead sent at 01/03/2024 12:24 PM EST ----- Please let her know that she needs a blood test, CA-125 before I see her. Her appt is tomorrow, but she really just needs to get the blood drawn because I won't know how to advise her until I get the results. I put the order in.

## 2024-01-03 NOTE — Telephone Encounter (Signed)
 Patient aware

## 2024-01-03 NOTE — Telephone Encounter (Signed)
 Called pt, no answer, LVMTRC.

## 2024-01-04 ENCOUNTER — Other Ambulatory Visit

## 2024-01-04 ENCOUNTER — Ambulatory Visit: Admitting: Obstetrics & Gynecology

## 2024-01-05 LAB — CA 125: Cancer Antigen (CA) 125: 8.4 U/mL (ref 0.0–38.1)

## 2024-01-16 NOTE — Progress Notes (Signed)
 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 Susa

## 2024-01-17 ENCOUNTER — Ambulatory Visit: Admitting: Pediatrics

## 2024-01-17 ENCOUNTER — Ambulatory Visit: Admitting: Obstetrics & Gynecology

## 2024-01-17 ENCOUNTER — Encounter: Payer: Self-pay | Admitting: Pediatrics

## 2024-01-17 ENCOUNTER — Encounter: Payer: Self-pay | Admitting: Obstetrics & Gynecology

## 2024-01-17 VITALS — BP 141/85 | HR 86 | Ht 62.0 in | Wt 168.2 lb

## 2024-01-17 VITALS — BP 137/78 | HR 74 | Temp 98.0°F | Ht 62.0 in | Wt 168.4 lb

## 2024-01-17 DIAGNOSIS — Z1231 Encounter for screening mammogram for malignant neoplasm of breast: Secondary | ICD-10-CM

## 2024-01-17 DIAGNOSIS — I10 Essential (primary) hypertension: Secondary | ICD-10-CM

## 2024-01-17 DIAGNOSIS — D3911 Neoplasm of uncertain behavior of right ovary: Secondary | ICD-10-CM

## 2024-01-17 DIAGNOSIS — E1142 Type 2 diabetes mellitus with diabetic polyneuropathy: Secondary | ICD-10-CM

## 2024-01-17 DIAGNOSIS — N3281 Overactive bladder: Secondary | ICD-10-CM

## 2024-01-17 MED ORDER — OXYBUTYNIN CHLORIDE ER 5 MG PO TB24: 5.0000 mg | ORAL_TABLET | Freq: Every day | ORAL | 2 refills | Status: AC

## 2024-01-17 NOTE — Progress Notes (Signed)
    GYNECOLOGY PROGRESS NOTE  Subjective:    Patient ID: [name], female    DOB: Jun 24, 1934, 89 y.o.   MRN: [id number]  HPI  Patient is a 89 y.o. married P3 here for follow up after having a BSO 08/2023 by Dr Janit. The pathology showed a right ovarian borderline tumor. She is doing well currently. She still drives.  The following portions of the patient's history were reviewed and updated as appropriate: allergies, current medications, past family history, past medical history, past social history, past surgical history, and problem list.  Review of Systems Pertinent items are noted in HPI.  She and her husband are occasionally sexually active.  Objective:   Blood pressure (!) 141/85, pulse 86, height 5' 2 (1.575 m), weight 168 lb 3.2 oz (76.3 kg). Body mass index is 30.76 kg/m. Well nourished, well hydrated Latina, no apparent distress  EG- atrophy, normal Pederson speculum used. Vagina appears atophic but normal Bimanual exam reveals no masses or discomfort. A recent CA-125 was normal at 8.4  Assessment:   1. Ovarian tumor of borderline malignancy, right      Plan:   1. Ovarian tumor of borderline malignancy, right (Primary)   Mancil Barter, MD  Starla Harland JAYSON, MD I would see her for exam and CA125 every six months for about 3 years and then annually for two more years. Risk of recurrence of a borderline tumor should be very low.  And it was small and removed in an endocatch bag without rupture in the abdomen.  She is aware of this plan and agrees.

## 2024-01-17 NOTE — Progress Notes (Signed)
 Office Visit  BP 137/78   Pulse 74   Temp 98 F (36.7 C) (Oral)   Ht 5' 2 (1.575 m)   Wt 168 lb 6.4 oz (76.4 kg)   SpO2 98%   BMI 30.80 kg/m    Subjective:    Patient ID: [name], female    DOB: [birth year], 89 y.o.   MRN: [id number]  HPI: [name] is a 89 y.o. female  Chief Complaint  Patient presents with   Diabetes   Hyperlipidemia   #DM Denies vision changes, increased urinary frequency, urgency, neuropathy, increased thirst. Well controlled Intermittent accu checks all within goal  #HTN No chest pain, sob, palpations, orthopnea, dyspnea, PND, lower extremity edema. Reports good medication compliance  #overactive bladder Incontinence a few times a week due to inability to reach bathroom Frequent urination present at night as well Chronic issue, forgot to mention to gyneoclogy  Relevant past medical, surgical, family and social history reviewed and updated as indicated. Interim medical history since our last visit reviewed. Allergies and medications reviewed and updated.  ROS per HPI unless specifically indicated above     Objective:    BP 137/78   Pulse 74   Temp 98 F (36.7 C) (Oral)   Ht 5' 2 (1.575 m)   Wt 168 lb 6.4 oz (76.4 kg)   SpO2 98%   BMI 30.80 kg/m   Wt Readings from Last 3 Encounters:  01/17/24 168 lb 6.4 oz (76.4 kg)  01/17/24 168 lb 3.2 oz (76.3 kg)  10/12/23 169 lb 3.2 oz (76.7 kg)     Physical Exam Constitutional:      Appearance: Normal appearance.  Pulmonary:     Effort: Pulmonary effort is normal.  Musculoskeletal:        General: Normal range of motion.  Skin:    Comments: Normal skin color  Neurological:     General: No focal deficit present.     Mental Status: She is alert. Mental status is at baseline.  Psychiatric:        Mood and Affect: Mood normal.        Behavior: Behavior normal.        Thought Content: Thought content normal.         10/12/2023    2:21 PM 09/28/2023    2:04 PM  08/10/2023    1:19 PM 08/05/2023    1:30 PM 04/13/2023   10:43 AM  Depression screen PHQ 2/9  Decreased Interest 0 0 0 0 0  Down, Depressed, Hopeless 0 0 0 0 0  PHQ - 2 Score 0 0 0 0 0  Altered sleeping 0 0 0 0 1  Tired, decreased energy 0 1 0 0 1  Change in appetite 0 1 0 0 0  Feeling bad or failure about yourself  0 0 0 0 0  Trouble concentrating 0 0 0 0 0  Moving slowly or fidgety/restless 0 0 0 0 1  Suicidal thoughts 0 0 0 0 0  PHQ-9 Score 0  2  0  0  3   Difficult doing work/chores Not difficult at all Not difficult at all Not difficult at all  Not difficult at all     Data saved with a previous flowsheet row definition       10/12/2023    2:21 PM 09/28/2023    2:05 PM 08/10/2023    1:20 PM 08/05/2023    1:31 PM  GAD 7 : Generalized Anxiety Score  Nervous, Anxious, on Edge 0 0 0 0  Control/stop worrying 0 0 0 0  Worry too much - different things 0 0 0 0  Trouble relaxing 0 0 0 0  Restless 0 0 0 0  Easily annoyed or irritable 0 0 0 0  Afraid - awful might happen 0 0 0 0  Total GAD 7 Score 0 0 0 0  Anxiety Difficulty Not difficult at all Not difficult at all Not difficult at all Not difficult at all       Assessment & Plan:  Assessment & Plan   Type 2 diabetes, controlled, with peripheral neuropathy (HCC) Assessment & Plan: Well controlled, CTM. Labs as below. Will schedule eye exam.  Orders: -     Hemoglobin A1c -     Lipid panel  Essential hypertension Assessment & Plan: Well controlled on current regimen. CTM.  Orders: -     Basic metabolic panel with GFR  Overactive bladder Assessment & Plan: Chronic issue. Seems like some urge incontinence present as well. Will plan to trial oxybutynin .  Orders: -     oxyBUTYnin  Chloride ER; Take 1 tablet (5 mg total) by mouth at bedtime.  Dispense: 30 tablet; Refill: 2  Encounter for screening mammogram for malignant neoplasm of breast -     3D Screening Mammogram, Left and Right; Future     Follow up  plan: Return in about 4 months (around 05/17/2024).  Hadassah SHAUNNA Nett, MD

## 2024-01-18 ENCOUNTER — Ambulatory Visit: Payer: Self-pay | Admitting: Pediatrics

## 2024-01-18 LAB — BASIC METABOLIC PANEL WITH GFR
BUN/Creatinine Ratio: 24 (ref 12–28)
BUN: 18 mg/dL (ref 8–27)
CO2: 23 mmol/L (ref 20–29)
Calcium: 9.6 mg/dL (ref 8.7–10.3)
Chloride: 101 mmol/L (ref 96–106)
Creatinine, Ser: 0.75 mg/dL (ref 0.57–1.00)
Glucose: 136 mg/dL — ABNORMAL HIGH (ref 70–99)
Potassium: 4.4 mmol/L (ref 3.5–5.2)
Sodium: 140 mmol/L (ref 134–144)
eGFR: 77 mL/min/1.73 (ref >59)

## 2024-01-18 LAB — LIPID PANEL
Chol/HDL Ratio: 2.4 ratio (ref 0.0–4.4)
Cholesterol, Total: 118 mg/dL (ref 100–199)
HDL: 49 mg/dL (ref >39)
LDL Chol Calc (NIH): 53 mg/dL (ref 0–99)
Triglycerides: 82 mg/dL (ref 0–149)
VLDL Cholesterol Cal: 16 mg/dL (ref 5–40)

## 2024-01-18 LAB — HEMOGLOBIN A1C
Est. average glucose Bld gHb Est-mCnc: 157 mg/dL
Hgb A1c MFr Bld: 7.1 % — ABNORMAL HIGH (ref 4.8–5.6)

## 2024-01-20 ENCOUNTER — Other Ambulatory Visit: Payer: Self-pay | Admitting: Pediatrics

## 2024-01-20 DIAGNOSIS — L309 Dermatitis, unspecified: Secondary | ICD-10-CM

## 2024-01-23 NOTE — Telephone Encounter (Signed)
 Discontinued 01/17/24, patient preference.  Requested Prescriptions  Pending Prescriptions Disp Refills   triamcinolone  cream (KENALOG ) 0.1 % [Pharmacy Med Name: TRIAMCINOLONE  0.1% CREAM  30GM] 30 g 0    Sig: APPLY TOPICALLY TO THE AFFECTED AREA TWICE DAILY     Not Delegated - Dermatology:  Corticosteroids Failed - 01/23/2024  3:34 PM      Failed - This refill cannot be delegated      Passed - Valid encounter within last 12 months    Recent Outpatient Visits           6 days ago Encounter for screening mammogram for malignant neoplasm of breast   [facility] Hadassah SQUIBB, MD   3 months ago Essential hypertension   [facility] Hadassah SQUIBB, MD   3 months ago Essential hypertension   [place] Hadassah SQUIBB, MD   5 months ago Essential hypertension   [facility] Hadassah SQUIBB, MD   5 months ago Essential hypertension   [facility] Hadassah SQUIBB, MD

## 2024-01-24 ENCOUNTER — Other Ambulatory Visit: Payer: Self-pay | Admitting: Pediatrics

## 2024-01-24 ENCOUNTER — Telehealth: Payer: Self-pay

## 2024-01-24 DIAGNOSIS — L309 Dermatitis, unspecified: Secondary | ICD-10-CM

## 2024-01-24 MED ORDER — TRIAMCINOLONE ACETONIDE 0.1 % EX CREA: 1.0000 | TOPICAL_CREAM | Freq: Two times a day (BID) | CUTANEOUS | 1 refills | Status: AC

## 2024-01-24 NOTE — Telephone Encounter (Signed)
 Called and LVM letting patient know that the medication has been sent in for her.

## 2024-01-24 NOTE — Telephone Encounter (Signed)
 Patient would like a refill for Triamcinolene  Acetonide Cream 0.1% sent to [facility] in [place].  Patient is going out of town on Thursday and needs this for her trip. Please call patient  to advise when/if approved.

## 2024-01-24 NOTE — Telephone Encounter (Signed)
 Routing to provider to advise. Triamcinolone  is not on patient's current medication list.

## 2024-01-24 NOTE — Progress Notes (Signed)
 Sent triamcinolone  refills.  Hadassah SHAUNNA Nett, MD

## 2024-01-25 ENCOUNTER — Encounter: Payer: Self-pay | Admitting: Pediatrics

## 2024-01-25 DIAGNOSIS — N3281 Overactive bladder: Secondary | ICD-10-CM | POA: Insufficient documentation

## 2024-01-25 NOTE — Addendum Note (Signed)
 Addended by: HEROLD HADASSAH SQUIBB on: 01/25/2024 08:49 AM   Modules accepted: Level of Service

## 2024-01-25 NOTE — Assessment & Plan Note (Signed)
 Well controlled on current regimen. CTM.

## 2024-01-25 NOTE — Assessment & Plan Note (Signed)
 Well controlled, CTM. Labs as below. Will schedule eye exam.

## 2024-01-25 NOTE — Assessment & Plan Note (Signed)
 Chronic issue. Seems like some urge incontinence present as well. Will plan to trial oxybutynin .

## 2024-03-02 ENCOUNTER — Ambulatory Visit
Admission: RE | Admit: 2024-03-02 | Discharge: 2024-03-02 | Disposition: A | Discharge status: Home / Self Care | Source: Ambulatory Visit | Attending: Pediatrics | Admitting: Pediatrics

## 2024-03-02 DIAGNOSIS — Z1231 Encounter for screening mammogram for malignant neoplasm of breast: Secondary | ICD-10-CM | POA: Diagnosis present

## 2024-05-18 ENCOUNTER — Ambulatory Visit: Admitting: Family Medicine
